# Patient Record
Sex: Female | Born: 1945
Health system: Southern US, Community
[De-identification: ages and names within clinical notes are randomized; demographics above are authoritative.]

## PROBLEM LIST (undated history)

## (undated) DIAGNOSIS — D126 Benign neoplasm of colon, unspecified: Secondary | ICD-10-CM

## (undated) DIAGNOSIS — I509 Heart failure, unspecified: Secondary | ICD-10-CM

## (undated) DIAGNOSIS — C50919 Malignant neoplasm of unspecified site of unspecified female breast: Secondary | ICD-10-CM

## (undated) DIAGNOSIS — K219 Gastro-esophageal reflux disease without esophagitis: Secondary | ICD-10-CM

## (undated) DIAGNOSIS — J45909 Unspecified asthma, uncomplicated: Secondary | ICD-10-CM

## (undated) DIAGNOSIS — K648 Other hemorrhoids: Secondary | ICD-10-CM

## (undated) DIAGNOSIS — F329 Major depressive disorder, single episode, unspecified: Secondary | ICD-10-CM

## (undated) DIAGNOSIS — M5126 Other intervertebral disc displacement, lumbar region: Secondary | ICD-10-CM

## (undated) DIAGNOSIS — R011 Cardiac murmur, unspecified: Secondary | ICD-10-CM

## (undated) DIAGNOSIS — K589 Irritable bowel syndrome without diarrhea: Secondary | ICD-10-CM

## (undated) DIAGNOSIS — I10 Essential (primary) hypertension: Secondary | ICD-10-CM

## (undated) DIAGNOSIS — E785 Hyperlipidemia, unspecified: Secondary | ICD-10-CM

## (undated) DIAGNOSIS — M25511 Pain in right shoulder: Secondary | ICD-10-CM

## (undated) DIAGNOSIS — E119 Type 2 diabetes mellitus without complications: Secondary | ICD-10-CM

## (undated) DIAGNOSIS — F32A Depression, unspecified: Secondary | ICD-10-CM

## (undated) DIAGNOSIS — M51369 Other intervertebral disc degeneration, lumbar region without mention of lumbar back pain or lower extremity pain: Secondary | ICD-10-CM

## (undated) DIAGNOSIS — D369 Benign neoplasm, unspecified site: Secondary | ICD-10-CM

## (undated) DIAGNOSIS — K5792 Diverticulitis of intestine, part unspecified, without perforation or abscess without bleeding: Secondary | ICD-10-CM

## (undated) DIAGNOSIS — M1990 Unspecified osteoarthritis, unspecified site: Secondary | ICD-10-CM

## (undated) DIAGNOSIS — R1013 Epigastric pain: Secondary | ICD-10-CM

## (undated) HISTORY — DX: Other intervertebral disc displacement, lumbar region: M51.26

## (undated) HISTORY — PX: APPENDECTOMY: SHX54

## (undated) HISTORY — PX: TONSILLECTOMY: SUR1361

## (undated) HISTORY — DX: Diverticulitis of intestine, part unspecified, without perforation or abscess without bleeding: K57.92

## (undated) HISTORY — DX: Benign neoplasm of colon, unspecified: D12.6

## (undated) HISTORY — PX: CHOLECYSTECTOMY: SHX55

## (undated) HISTORY — DX: Other hemorrhoids: K64.8

## (undated) HISTORY — DX: Irritable bowel syndrome, unspecified: K58.9

## (undated) HISTORY — DX: Essential (primary) hypertension: I10

## (undated) HISTORY — DX: Other intervertebral disc degeneration, lumbar region without mention of lumbar back pain or lower extremity pain: M51.369

## (undated) HISTORY — PX: TUBAL LIGATION: SHX77

## (undated) HISTORY — DX: Gastro-esophageal reflux disease without esophagitis: K21.9

## (undated) HISTORY — DX: Benign neoplasm, unspecified site: D36.9

## (undated) HISTORY — DX: Epigastric pain: R10.13

## (undated) HISTORY — PX: ABDOMINAL HYSTERECTOMY: SHX81

## (undated) HISTORY — DX: Unspecified osteoarthritis, unspecified site: M19.90

## (undated) HISTORY — DX: Malignant neoplasm of unspecified site of unspecified female breast: C50.919

## (undated) HISTORY — PX: EYE SURGERY: SHX253

## (undated) HISTORY — DX: Type 2 diabetes mellitus without complications: E11.9

---

## 1993-12-24 HISTORY — PX: MASTECTOMY: SHX3

## 2000-06-17 ENCOUNTER — Encounter: Payer: Self-pay | Admitting: General Surgery

## 2000-06-17 ENCOUNTER — Encounter: Admission: RE | Admit: 2000-06-17 | Discharge: 2000-06-17 | Payer: Self-pay | Admitting: General Surgery

## 2001-06-19 ENCOUNTER — Encounter: Admission: RE | Admit: 2001-06-19 | Discharge: 2001-06-19 | Payer: Self-pay | Admitting: Oncology

## 2001-06-19 ENCOUNTER — Encounter (HOSPITAL_COMMUNITY): Payer: Self-pay | Admitting: Oncology

## 2001-09-30 ENCOUNTER — Encounter (HOSPITAL_COMMUNITY): Admission: RE | Admit: 2001-09-30 | Discharge: 2001-10-30 | Payer: Self-pay | Admitting: Oncology

## 2001-10-30 ENCOUNTER — Ambulatory Visit (HOSPITAL_COMMUNITY): Admission: RE | Admit: 2001-10-30 | Discharge: 2001-10-30 | Payer: Self-pay | Admitting: Internal Medicine

## 2001-10-30 ENCOUNTER — Encounter: Payer: Self-pay | Admitting: Internal Medicine

## 2002-06-09 ENCOUNTER — Encounter: Payer: Self-pay | Admitting: Internal Medicine

## 2002-06-09 ENCOUNTER — Ambulatory Visit (HOSPITAL_COMMUNITY): Admission: RE | Admit: 2002-06-09 | Discharge: 2002-06-09 | Payer: Self-pay | Admitting: Internal Medicine

## 2002-07-31 ENCOUNTER — Ambulatory Visit (HOSPITAL_COMMUNITY): Admission: RE | Admit: 2002-07-31 | Discharge: 2002-07-31 | Payer: Self-pay | Admitting: Internal Medicine

## 2002-07-31 ENCOUNTER — Encounter: Payer: Self-pay | Admitting: Internal Medicine

## 2002-09-11 ENCOUNTER — Encounter (HOSPITAL_COMMUNITY): Payer: Self-pay | Admitting: Oncology

## 2002-09-11 ENCOUNTER — Encounter: Admission: RE | Admit: 2002-09-11 | Discharge: 2002-09-11 | Payer: Self-pay | Admitting: Oncology

## 2002-09-30 ENCOUNTER — Encounter: Admission: RE | Admit: 2002-09-30 | Discharge: 2002-09-30 | Payer: Self-pay | Admitting: Oncology

## 2002-09-30 ENCOUNTER — Encounter (HOSPITAL_COMMUNITY): Admission: RE | Admit: 2002-09-30 | Discharge: 2002-10-30 | Payer: Self-pay | Admitting: Oncology

## 2003-07-13 ENCOUNTER — Ambulatory Visit (HOSPITAL_COMMUNITY): Admission: RE | Admit: 2003-07-13 | Discharge: 2003-07-13 | Payer: Self-pay | Admitting: Internal Medicine

## 2003-07-13 HISTORY — PX: COLONOSCOPY: SHX174

## 2003-08-18 ENCOUNTER — Encounter: Payer: Self-pay | Admitting: Internal Medicine

## 2003-08-18 ENCOUNTER — Ambulatory Visit (HOSPITAL_COMMUNITY): Admission: RE | Admit: 2003-08-18 | Discharge: 2003-08-18 | Payer: Self-pay | Admitting: Internal Medicine

## 2003-10-01 ENCOUNTER — Encounter (HOSPITAL_COMMUNITY): Admission: RE | Admit: 2003-10-01 | Discharge: 2003-10-31 | Payer: Self-pay | Admitting: Oncology

## 2003-10-01 ENCOUNTER — Encounter: Admission: RE | Admit: 2003-10-01 | Discharge: 2003-10-01 | Payer: Self-pay | Admitting: Oncology

## 2003-10-05 ENCOUNTER — Encounter (HOSPITAL_COMMUNITY): Payer: Self-pay | Admitting: Oncology

## 2003-10-05 ENCOUNTER — Encounter: Admission: RE | Admit: 2003-10-05 | Discharge: 2003-10-05 | Payer: Self-pay | Admitting: Oncology

## 2003-11-09 ENCOUNTER — Ambulatory Visit (HOSPITAL_COMMUNITY): Admission: RE | Admit: 2003-11-09 | Discharge: 2003-11-09 | Payer: Self-pay | Admitting: Unknown Physician Specialty

## 2003-11-19 ENCOUNTER — Emergency Department (HOSPITAL_COMMUNITY): Admission: EM | Admit: 2003-11-19 | Discharge: 2003-11-19 | Payer: Self-pay | Admitting: Emergency Medicine

## 2004-01-04 ENCOUNTER — Encounter: Admission: RE | Admit: 2004-01-04 | Discharge: 2004-01-04 | Payer: Self-pay | Admitting: Neurosurgery

## 2004-01-18 ENCOUNTER — Encounter: Admission: RE | Admit: 2004-01-18 | Discharge: 2004-01-18 | Payer: Self-pay | Admitting: Neurosurgery

## 2004-03-08 ENCOUNTER — Ambulatory Visit (HOSPITAL_COMMUNITY): Admission: RE | Admit: 2004-03-08 | Discharge: 2004-03-08 | Payer: Self-pay | Admitting: Internal Medicine

## 2004-06-28 ENCOUNTER — Encounter: Admission: RE | Admit: 2004-06-28 | Discharge: 2004-06-28 | Payer: Self-pay | Admitting: Neurosurgery

## 2004-09-22 ENCOUNTER — Encounter: Admission: RE | Admit: 2004-09-22 | Discharge: 2004-09-22 | Payer: Self-pay | Admitting: Neurosurgery

## 2004-10-10 ENCOUNTER — Encounter (HOSPITAL_COMMUNITY): Admission: RE | Admit: 2004-10-10 | Discharge: 2004-11-09 | Payer: Self-pay | Admitting: Oncology

## 2004-10-10 ENCOUNTER — Encounter: Admission: RE | Admit: 2004-10-10 | Discharge: 2004-10-10 | Payer: Self-pay | Admitting: Oncology

## 2005-01-18 ENCOUNTER — Encounter (HOSPITAL_COMMUNITY): Admission: RE | Admit: 2005-01-18 | Discharge: 2005-02-17 | Payer: Self-pay | Admitting: Internal Medicine

## 2005-01-31 ENCOUNTER — Ambulatory Visit: Payer: Self-pay | Admitting: Internal Medicine

## 2005-02-01 ENCOUNTER — Ambulatory Visit (HOSPITAL_COMMUNITY): Admission: RE | Admit: 2005-02-01 | Discharge: 2005-02-01 | Payer: Self-pay | Admitting: Internal Medicine

## 2005-02-15 ENCOUNTER — Ambulatory Visit (HOSPITAL_COMMUNITY): Admission: RE | Admit: 2005-02-15 | Discharge: 2005-02-15 | Payer: Self-pay | Admitting: Internal Medicine

## 2005-03-14 ENCOUNTER — Ambulatory Visit: Payer: Self-pay | Admitting: Internal Medicine

## 2005-09-24 ENCOUNTER — Encounter: Admission: RE | Admit: 2005-09-24 | Discharge: 2005-09-24 | Payer: Self-pay | Admitting: Internal Medicine

## 2005-10-10 ENCOUNTER — Encounter (HOSPITAL_COMMUNITY): Admission: RE | Admit: 2005-10-10 | Discharge: 2005-11-09 | Payer: Self-pay | Admitting: Oncology

## 2005-10-10 ENCOUNTER — Encounter: Admission: RE | Admit: 2005-10-10 | Discharge: 2005-10-10 | Payer: Self-pay | Admitting: Oncology

## 2005-10-10 ENCOUNTER — Ambulatory Visit (HOSPITAL_COMMUNITY): Payer: Self-pay | Admitting: Oncology

## 2005-12-24 HISTORY — PX: BACK SURGERY: SHX140

## 2006-10-09 ENCOUNTER — Ambulatory Visit (HOSPITAL_COMMUNITY): Payer: Self-pay | Admitting: Oncology

## 2006-10-09 ENCOUNTER — Encounter (HOSPITAL_COMMUNITY): Admission: RE | Admit: 2006-10-09 | Discharge: 2006-11-08 | Payer: Self-pay | Admitting: Oncology

## 2006-10-09 ENCOUNTER — Encounter: Admission: RE | Admit: 2006-10-09 | Discharge: 2006-10-09 | Payer: Self-pay | Admitting: Oncology

## 2006-11-05 ENCOUNTER — Encounter: Admission: RE | Admit: 2006-11-05 | Discharge: 2006-11-05 | Payer: Self-pay | Admitting: Oncology

## 2006-11-20 ENCOUNTER — Inpatient Hospital Stay (HOSPITAL_COMMUNITY): Admission: RE | Admit: 2006-11-20 | Discharge: 2006-11-27 | Payer: Self-pay | Admitting: Neurosurgery

## 2006-11-25 ENCOUNTER — Ambulatory Visit: Payer: Self-pay | Admitting: Physical Medicine & Rehabilitation

## 2007-01-02 ENCOUNTER — Encounter (HOSPITAL_COMMUNITY): Admission: RE | Admit: 2007-01-02 | Discharge: 2007-02-01 | Payer: Self-pay | Admitting: Neurosurgery

## 2007-03-18 ENCOUNTER — Encounter: Admission: RE | Admit: 2007-03-18 | Discharge: 2007-03-18 | Payer: Self-pay | Admitting: Obstetrics and Gynecology

## 2007-10-08 ENCOUNTER — Ambulatory Visit (HOSPITAL_COMMUNITY): Payer: Self-pay | Admitting: Oncology

## 2008-03-15 ENCOUNTER — Other Ambulatory Visit: Admission: RE | Admit: 2008-03-15 | Discharge: 2008-03-15 | Payer: Self-pay | Admitting: Obstetrics & Gynecology

## 2008-06-24 ENCOUNTER — Encounter: Admission: RE | Admit: 2008-06-24 | Discharge: 2008-06-24 | Payer: Self-pay | Admitting: Obstetrics and Gynecology

## 2008-10-06 ENCOUNTER — Encounter (HOSPITAL_COMMUNITY): Admission: RE | Admit: 2008-10-06 | Discharge: 2008-11-05 | Payer: Self-pay | Admitting: Oncology

## 2008-10-06 ENCOUNTER — Ambulatory Visit (HOSPITAL_COMMUNITY): Payer: Self-pay | Admitting: Oncology

## 2009-09-30 ENCOUNTER — Encounter: Admission: RE | Admit: 2009-09-30 | Discharge: 2009-09-30 | Payer: Self-pay | Admitting: Internal Medicine

## 2009-10-17 ENCOUNTER — Ambulatory Visit (HOSPITAL_COMMUNITY): Payer: Self-pay | Admitting: Oncology

## 2010-01-19 ENCOUNTER — Ambulatory Visit (HOSPITAL_COMMUNITY): Admission: RE | Admit: 2010-01-19 | Discharge: 2010-01-19 | Payer: Self-pay | Admitting: Internal Medicine

## 2010-01-20 ENCOUNTER — Ambulatory Visit (HOSPITAL_COMMUNITY): Admission: RE | Admit: 2010-01-20 | Discharge: 2010-01-20 | Payer: Self-pay | Admitting: Internal Medicine

## 2010-03-07 ENCOUNTER — Encounter: Payer: Self-pay | Admitting: Orthopedic Surgery

## 2010-03-07 ENCOUNTER — Encounter: Admission: RE | Admit: 2010-03-07 | Discharge: 2010-03-07 | Payer: Self-pay | Admitting: Neurosurgery

## 2010-03-14 ENCOUNTER — Encounter: Payer: Self-pay | Admitting: Orthopedic Surgery

## 2010-03-30 ENCOUNTER — Ambulatory Visit: Payer: Self-pay | Admitting: Orthopedic Surgery

## 2010-03-30 DIAGNOSIS — M19019 Primary osteoarthritis, unspecified shoulder: Secondary | ICD-10-CM | POA: Insufficient documentation

## 2010-03-30 DIAGNOSIS — M75120 Complete rotator cuff tear or rupture of unspecified shoulder, not specified as traumatic: Secondary | ICD-10-CM | POA: Insufficient documentation

## 2010-04-03 ENCOUNTER — Encounter: Payer: Self-pay | Admitting: Orthopedic Surgery

## 2010-04-03 ENCOUNTER — Encounter (HOSPITAL_COMMUNITY): Admission: RE | Admit: 2010-04-03 | Discharge: 2010-05-03 | Payer: Self-pay | Admitting: Orthopedic Surgery

## 2010-05-01 ENCOUNTER — Ambulatory Visit: Payer: Self-pay | Admitting: Orthopedic Surgery

## 2010-05-03 ENCOUNTER — Encounter: Payer: Self-pay | Admitting: Orthopedic Surgery

## 2010-05-04 ENCOUNTER — Encounter (HOSPITAL_COMMUNITY): Admission: RE | Admit: 2010-05-04 | Discharge: 2010-06-03 | Payer: Self-pay | Admitting: Orthopedic Surgery

## 2010-06-05 ENCOUNTER — Encounter (HOSPITAL_COMMUNITY): Admission: RE | Admit: 2010-06-05 | Discharge: 2010-07-05 | Payer: Self-pay | Admitting: Orthopedic Surgery

## 2010-06-05 ENCOUNTER — Encounter: Payer: Self-pay | Admitting: Orthopedic Surgery

## 2010-10-02 ENCOUNTER — Encounter: Admission: RE | Admit: 2010-10-02 | Discharge: 2010-10-02 | Payer: Self-pay | Admitting: Internal Medicine

## 2010-11-03 ENCOUNTER — Ambulatory Visit (HOSPITAL_COMMUNITY): Payer: Self-pay | Admitting: Oncology

## 2010-11-03 ENCOUNTER — Encounter (HOSPITAL_COMMUNITY)
Admission: RE | Admit: 2010-11-03 | Discharge: 2010-12-03 | Payer: Self-pay | Source: Home / Self Care | Attending: Oncology | Admitting: Oncology

## 2010-11-21 ENCOUNTER — Ambulatory Visit: Payer: Self-pay | Admitting: Internal Medicine

## 2010-11-21 ENCOUNTER — Ambulatory Visit (HOSPITAL_COMMUNITY)
Admission: RE | Admit: 2010-11-21 | Discharge: 2010-11-21 | Payer: Self-pay | Source: Home / Self Care | Admitting: Internal Medicine

## 2010-11-21 ENCOUNTER — Encounter: Payer: Self-pay | Admitting: Urgent Care

## 2010-11-21 DIAGNOSIS — M719 Bursopathy, unspecified: Secondary | ICD-10-CM

## 2010-11-21 DIAGNOSIS — D126 Benign neoplasm of colon, unspecified: Secondary | ICD-10-CM | POA: Insufficient documentation

## 2010-11-21 DIAGNOSIS — M67919 Unspecified disorder of synovium and tendon, unspecified shoulder: Secondary | ICD-10-CM | POA: Insufficient documentation

## 2010-11-21 DIAGNOSIS — Z853 Personal history of malignant neoplasm of breast: Secondary | ICD-10-CM

## 2010-11-21 DIAGNOSIS — R109 Unspecified abdominal pain: Secondary | ICD-10-CM | POA: Insufficient documentation

## 2010-11-21 DIAGNOSIS — K5900 Constipation, unspecified: Secondary | ICD-10-CM | POA: Insufficient documentation

## 2010-11-21 LAB — CONVERTED CEMR LAB
ALT: 47 units/L — ABNORMAL HIGH (ref 0–35)
AST: 33 units/L (ref 0–37)
Alkaline Phosphatase: 84 units/L (ref 39–117)
Amylase: 55 units/L (ref 0–105)
Basophils Absolute: 0 10*3/uL (ref 0.0–0.1)
Bilirubin Urine: NEGATIVE
Eosinophils Absolute: 0.3 10*3/uL (ref 0.0–0.7)
Eosinophils Relative: 3 % (ref 0–5)
HCT: 37.3 % (ref 36.0–46.0)
Hemoglobin: 12.5 g/dL (ref 12.0–15.0)
Indirect Bilirubin: 0.5 mg/dL (ref 0.0–0.9)
Lipase: 31 units/L (ref 11–59)
Lymphocytes Relative: 25 % (ref 12–46)
MCHC: 33.5 g/dL (ref 30.0–36.0)
Neutrophils Relative %: 65 % (ref 43–77)
Nitrite: NEGATIVE
Platelets: 282 10*3/uL (ref 150–400)
RBC: 4.2 M/uL (ref 3.87–5.11)
Total Bilirubin: 0.5 mg/dL (ref 0.3–1.2)
Total Protein: 7.4 g/dL (ref 6.0–8.3)
Urobilinogen, UA: 0.2 (ref 0.0–1.0)
WBC: 8.4 10*3/uL (ref 4.0–10.5)

## 2010-11-23 ENCOUNTER — Encounter: Payer: Self-pay | Admitting: Internal Medicine

## 2010-11-23 ENCOUNTER — Telehealth (INDEPENDENT_AMBULATORY_CARE_PROVIDER_SITE_OTHER): Payer: Self-pay | Admitting: *Deleted

## 2010-11-23 DIAGNOSIS — D369 Benign neoplasm, unspecified site: Secondary | ICD-10-CM

## 2010-11-23 HISTORY — DX: Benign neoplasm, unspecified site: D36.9

## 2010-11-27 ENCOUNTER — Telehealth (INDEPENDENT_AMBULATORY_CARE_PROVIDER_SITE_OTHER): Payer: Self-pay

## 2010-12-11 ENCOUNTER — Telehealth (INDEPENDENT_AMBULATORY_CARE_PROVIDER_SITE_OTHER): Payer: Self-pay | Admitting: *Deleted

## 2010-12-13 ENCOUNTER — Ambulatory Visit (HOSPITAL_COMMUNITY)
Admission: RE | Admit: 2010-12-13 | Discharge: 2010-12-13 | Payer: Self-pay | Source: Home / Self Care | Attending: Internal Medicine | Admitting: Internal Medicine

## 2010-12-13 HISTORY — PX: COLONOSCOPY: SHX174

## 2010-12-13 HISTORY — PX: ESOPHAGOGASTRODUODENOSCOPY: SHX1529

## 2010-12-18 ENCOUNTER — Encounter: Payer: Self-pay | Admitting: Internal Medicine

## 2010-12-19 ENCOUNTER — Telehealth (INDEPENDENT_AMBULATORY_CARE_PROVIDER_SITE_OTHER): Payer: Self-pay

## 2011-01-13 ENCOUNTER — Encounter (HOSPITAL_COMMUNITY): Payer: Self-pay | Admitting: Oncology

## 2011-01-13 ENCOUNTER — Encounter: Payer: Self-pay | Admitting: Unknown Physician Specialty

## 2011-01-23 NOTE — Letter (Signed)
Summary: TCS ORDER  TCS ORDER   Imported By: Ave Filter 11/23/2010 14:09:00  _____________________________________________________________________  External Attachment:    Type:   Image     Comment:   External Document  Appended Document: phone note/ pt cx TCS ok; call her back in mid jan to schedule

## 2011-01-23 NOTE — Miscellaneous (Signed)
Summary: OT Progress note  OT Progress note   Imported By: Jacklynn Ganong 05/08/2010 15:04:26  _____________________________________________________________________  External Attachment:    Type:   Image     Comment:   External Document

## 2011-01-23 NOTE — Assessment & Plan Note (Signed)
Summary: CONSULT/TREAT ROTAT CUFF/XR+MRI GR.IMAG/REF K.CABBELL/BCBS,ME...   Vital Signs:  Patient profile:   65 year old female Height:      61 inches Weight:      243 pounds Pulse rate:   70 / minute Resp:     16 per minute  Vitals Entered By: Fuller Canada MD (March 30, 2010 11:55 AM)  Visit Type:  Initial Consult Referring Provider:  Dr. Franky Macho Primary Provider:  Dr. Ouida Sills  CC:  left shoulder pain.  History of Present Illness: I saw Ann Fuller in the office today for an initial visit.  She is a 65 years old woman with the complaint of:  Left shoulder pain.  Xrays and MRI [facility] 03/07/10 left shoulder.  Meds: Insulin, Byetta, Temazepam, Amlodipine, Vicodin, Lipitor, Vitamin D, Lorazepam, ASA, Losartan, Nexium, Chlorthaldone, Carvedilol.  This is a 65 year old female who was injured back in 2009 with a pulling injury to the LEFT arm while she was trying to grab a travel bag.  She had an MRI done and it showed that showed a rotator cuff tear.  That MRI was done March 15 of 2011 shows a full thickness partially retracted supraspinatus tendon tear with muscle atrophy there is tendinosis in the infraspinatus and his glenohumeral degenerative changes.  Her pain has progressively gotten worse she complains of throbbing stabbing and burning.  Her pain is a 10.  It comes and goes it's worse at night and with exercise.  She complains of some numbness tingling locking and catching when lifting her arm and she has trouble doing her hair  Dr. Franky Macho did see her for cervical spine disease.  She had therapy for 6 weeks.  He indicates there is no problem in her cervical spine.  Allergies (verified): 1)  ! Codeine  Past History:  Past Medical History: htn diabetes cholesterol acid reflux  Past Surgical History: lower back gall bladder tubal ligation left mastectomy hysterectomy  Family History: FH of Cancer:  Family History of Diabetes Family History  Coronary Heart Disease female < 74 Family History of Arthritis Hx, family, chronic respiratory condition Hx, family, kidney disease NEC  Social History: Patient is married.  retired no smoking no alcohol use 2 cups of coffee each am  Review of Systems Constitutional:  Complains of fatigue; denies weight loss, weight gain, fever, and chills. Cardiovascular:  Complains of murmurs; denies chest pain, palpitations, and fainting. Respiratory:  Denies short of breath, wheezing, couch, tightness, pain on inspiration, and snoring . Gastrointestinal:  Complains of diarrhea and constipation; denies heartburn, nausea, vomiting, and blood in your stools. Genitourinary:  Denies frequency, urgency, difficulty urinating, painful urination, flank pain, and bleeding in urine. Neurologic:  Complains of numbness and tingling; denies unsteady gait, dizziness, tremors, and seizure. Musculoskeletal:  Denies joint pain, swelling, instability, stiffness, redness, heat, and muscle pain. Endocrine:  Complains of heat or cold intolerance; denies excessive thirst and exessive urination. Psychiatric:  Complains of nervousness and anxiety; denies depression and hallucinations. Skin:  Denies changes in the skin, poor healing, rash, itching, and redness. HEENT:  Denies blurred or double vision, eye pain, redness, and watering. Immunology:  Complains of seasonal allergies; denies sinus problems and allergic to bee stings. Hemoatologic:  Denies easy bleeding and brusing.  Physical Exam  Additional Exam:  GEN: well developed, well nourished, normal grooming and hygiene, no deformity and abnormal body habitus she is endomorphic  Height 5 feet 1 inch weight 213 pounds  CDV: pulses are normal, no edema, no erythema.  no tenderness  Lymph: normal lymph nodes   Skin: no rashes, skin lesions or open sores   NEURO: normal coordination, reflexes, sensation.   Psyche: awake, alert and oriented. Mood normal   Gait:  normal  LEFT shoulder examination reveals weakness decreased range of motion.  Acromial tenderness.  The shoulder is stable weakness is primarily in the supraspinatus tendon but there is mild weakness in the external rotators  RIGHT shoulder full range of motion, good shrinkage which is 5 out of 5  Stable shoulder.  No tenderness.        Impression & Recommendations:  Problem # 1:  RUPTURE ROTATOR CUFF (ICD-727.61) Assessment New  Orders: Consultation Level III (43329) Joint Aspirate / Injection, Large (20610) Depo- Medrol 40mg  (J1030)  Problem # 2:  SHOULDER, ARTHRITIS, DEGEN./OSTEO (ICD-715.91) Assessment: New  LEFT shoulder injection Verbal consent obtained/The shoulder was injected with depomedrol 40mg /cc and sensorcaine .25% . There were no complications  I advised that she should probably have a rotator cuff repair although with the amount of atrophy she has and the length of time since the tear she only has a 60% chance of success.  She therefore opted for injection and physical therapy  Orders: Consultation Level III (51884) Joint Aspirate / Injection, Large (20610) Depo- Medrol 40mg  (J1030)  Other Orders: Physical Therapy Referral (PT)  Patient Instructions: 1)  You have received an injection of cortisone today. You may experience increased pain at the injection site. Apply ice pack to the area for 20 minutes every 2 hours and take 2 xtra strength tylenol every 8 hours. This increased pain will usually resolve in 24 hours. The injection will take effect in 3-10 days.  2)  return in 2 months  3)  PT  4)  continue vicodin for pain

## 2011-01-23 NOTE — Consult Note (Signed)
Summary: Office note ftom Dr. Franky Macho  Office note ftom Dr. Franky Macho   Imported By: Jacklynn Ganong 04/04/2010 13:59:02  _____________________________________________________________________  External Attachment:    Type:   Image     Comment:   External Document

## 2011-01-23 NOTE — Miscellaneous (Signed)
Summary: Orders Update  Clinical Lists Changes  Orders: Added new Test order of T-Creatinine Blood [phone]) - Signed  Appended Document: ABD Pain has she started the miralax and colace? any results with BMs?  Appended Document: ABD Pain Per the pt she took the Colace and Miralax last night and still having abd pain.  Appended Document: ABD Pain She did have a bm last night.  Appended Document: ABD Pain Spoke with pt. States she is having LUQ pain under left breast/ribcage, 8/10, "crampy" in nature, +nausea. Had been taking Nexium daily but actually started taking sporadically secondary to diarrhea. States she feels like one might feel if they "had an ulcer". She is scheduled for a colonoscopy in mid December. Reviewed labs and CT, all benign except for +stool in colon. Has had productive BM, does not feel distended/constipated/bloated. Will switch from Nexium to Dexilant daily. I have placed about 2 weeks worth at front desk for her to pick up. I discussed with her signs/symptoms to watch for. If she has unresolved abdominal pain, worsening of symptoms, she was instructed to go to ED. Discussed that if LUQ pain continues despite daily PPI, may benefit from EGD along with TCS. Informed to contact our office towards end of sample pack to give progress report. Stated understanding.

## 2011-01-23 NOTE — Miscellaneous (Signed)
Summary: OT progress note  OT progress note   Imported By: Jacklynn Ganong 06/12/2010 13:23:00  _____________________________________________________________________  External Attachment:    Type:   Image     Comment:   External Document

## 2011-01-23 NOTE — Miscellaneous (Signed)
Summary: OT Clinical evaluation  OT Clinical evaluation   Imported By: Jacklynn Ganong 04/27/2010 08:47:19  _____________________________________________________________________  External Attachment:    Type:   Image     Comment:   External Document

## 2011-01-23 NOTE — Assessment & Plan Note (Signed)
Summary: 1 M RE-CK LT SHOULDER FOL'G PT/BCBS/MEDICARE/CAF   Referring Provider:  Dr. Franky Macho Primary Provider:  Dr. Ouida Sills   History of Present Illness: She is a 65 years old woman with the complaint of:  Left shoulder pain.  Xrays and MRI [facility] 03/07/10 left shoulder.  Meds: Insulin, Byetta, Temazepam, Amlodipine, Vicodin, Lipitor, Vitamin D, Lorazepam, ASA, Losartan, Nexium, Chlorthaldone, Carvedilol.  This is a 65 year old female who was injured back in 2009 with a pulling injury to the LEFT arm while she was trying to grab a travel bag.  She had an MRI done and it showed that showed a rotator cuff tear.  That MRI was done March 15 of 2011 shows a full thickness partially retracted supraspinatus tendon tear with muscle atrophy there is tendinosis in the infraspinatus and his glenohumeral degenerative changes.  Her pain has progressively gotten worse she complains of throbbing stabbing and burning.  Her pain is a 10.  It comes and goes it's worse at night and with exercise.  She complains of some numbness tingling locking and catching when lifting her arm and she has trouble doing her hair  Dr. Franky Macho did see her for cervical spine disease.  She had therapy for 6 weeks.  He indicates there is no problem in her cervical spine.  I advised that she should probably have a rotator cuff repair although with the amount of atrophy she has and the length of time since the tear she only has a 60% chance of success.  She therefore opted for injection and physical therapy  Today recheck post injection: pain is better  Hydrocodone for pain with 500 mg of tylenol     Allergies: 1)  ! Codeine  Physical Exam  Additional Exam:  Normal Appearance, Oriented x 3, Mood normal She was able to move her shoulder at 90 abduction 90 flexion she still had weakness in abduction and forward elevation      Impression & Recommendations:  Problem # 1:  SHOULDER, ARTHRITIS, DEGEN./OSTEO  (ICD-715.91) Assessment Improved  Orders: Est. Patient Level II ([address])  Problem # 2:  RUPTURE ROTATOR CUFF (ICD-727.61) Assessment: Improved  Orders: Est. Patient Level II (60454)  Patient Instructions: 1)  Please schedule a follow-up appointment in 3 months.

## 2011-01-23 NOTE — Letter (Signed)
Summary: History form  History form   Imported By: Jacklynn Ganong 04/04/2010 13:59:43  _____________________________________________________________________  External Attachment:    Type:   Image     Comment:   External Document

## 2011-01-23 NOTE — Letter (Signed)
Summary: CT ABD/PEL ORDER  CT ABD/PEL ORDER   Imported By: Rexene Alberts 11/21/2010 12:25:51  _____________________________________________________________________  External Attachment:    Type:   Image     Comment:   External Document

## 2011-01-23 NOTE — Letter (Signed)
Summary: *Orthopedic Consult Note  [facility]  [address] [place] [address]  [place], [place] [address]   Phone: [phone]  Fax: [phone]    Re:    SHARELL HILMER DOB:    March 07, 1946   Dear: Dr. Mikal Plane  Thank you for requesting that we see the above patient for consultation.  A copy of the detailed office note will be sent under separate cover, for your review.  Evaluation today is consistent with:  1)  SHOULDER, ARTHRITIS, DEGEN./OSTEO (ICD-715.91) 2)  RUPTURE ROTATOR CUFF (ICD-727.61)  Our recommendation is for: surgical repair but she only has a 60% chance of success placed on the atrophy and time interval since the tear.  She is therefore opted for physical therapy and subacromial injection.  Thank you for this opportunity to look after your patient.  Sincerely,   Terrance Mass. MD.

## 2011-01-25 ENCOUNTER — Ambulatory Visit: Payer: Medicare Other | Admitting: Gastroenterology

## 2011-01-25 ENCOUNTER — Encounter: Payer: Self-pay | Admitting: Gastroenterology

## 2011-01-25 DIAGNOSIS — R197 Diarrhea, unspecified: Secondary | ICD-10-CM | POA: Insufficient documentation

## 2011-01-25 DIAGNOSIS — R109 Unspecified abdominal pain: Secondary | ICD-10-CM

## 2011-01-25 NOTE — Letter (Signed)
Summary: Patient Notice, Colon Biopsy Results  [facility]  [address]   [place], [place] [address]   Phone: [phone]  Fax: [phone]       December 18, 2010   Ann Fuller [address] [place], [place]  [address] 02-27-1946    Dear [name],  I am pleased to inform you that the biopsies taken during your recent colonoscopy did not show any evidence of cancer upon pathologic examination.  Additional information/recommendations:  No further action is needed at this time.  Please follow-up with your primary care physician for your other healthcare needs.  Continue with the treatment plan as outlined on the day of your exam.  You should have a repeat colonoscopy examination  in 5 years.  Please call us if you are having persistent problems or have questions about your condition that have not been fully answered at this time.  Sincerely,    R. Roetta Sessions MD, FACP [facility] Ph: [phone]    Fax: [phone]   Appended Document: Patient Notice, Colon Biopsy Results Letter mailed to Pt.   Appended Document: Patient Notice, Colon Biopsy Results reminder in computer

## 2011-01-25 NOTE — Progress Notes (Signed)
Summary: ABD Pain  Phone Note Call from Patient Call back at Home Phone [phone]   Caller: Patient Call For: Lorenza Burton Reason for Call: Talk to Doctor Summary of Call: Patient called and stated she is still having the abd pain.Marland KitchenMarland KitchenPlease advise? Initial call taken by: Ave Filter,  November 23, 2010 10:58 AM     Appended Document: ABD Pain has she started the miralax and colace? any results with BMs?  Appended Document: ABD Pain Per the pt she took the Colace and Miralax last night and still having abd pain.  Appended Document: ABD Pain She did have a bm last night.  Appended Document: ABD Pain Spoke with pt. States she is having LUQ pain under left breast/ribcage, 8/10, "crampy" in nature, +nausea. Had been taking Nexium daily but actually started taking sporadically secondary to diarrhea. States she feels like one might feel if they "had an ulcer". She is scheduled for a colonoscopy in mid December. Reviewed labs and CT, all benign except for +stool in colon. Has had productive BM, does not feel distended/constipated/bloated. Will switch from Nexium to Dexilant daily. I have placed about 2 weeks worth at front desk for her to pick up. I discussed with her signs/symptoms to watch for. If she has unresolved abdominal pain, worsening of symptoms, she was instructed to go to ED. Discussed that if LUQ pain continues despite daily PPI, may benefit from EGD along with TCS. Informed to contact our office towards end of sample pack to give progress report. Stated understanding.

## 2011-01-25 NOTE — Progress Notes (Signed)
Summary: ABD PAIN  Phone Note Call from Patient Call back at Home Phone [phone]   Caller: Patient Call For: Lorenza Burton Reason for Call: Talk to Doctor Complaint: Abdominal Pain Action Taken: Provider Notified Summary of Call: Patient called and she is still complaining of abd pain. She would like to have an egd added to her already scheduled tcs. She also wanted know if there is anything else she could take for pain.Marland KitchenMarland KitchenMarland KitchenPlease advise? Initial call taken by: Ave Filter,  December 11, 2010 2:59 PM     Appended Document: ABD PAIN Called pt.  Still w/ left-sided pain 8/10.  Taking miralax once this week caused diarrhea, so did not take.  Had diarrhea 5 times in one day.  Taking Dexilant twice in past week.  BM daily otherwise.  Denies fever or chills.  c/o nausea, but denies vomiting.   Plan: 1) Take DEXILANT every day 2) Hold Miralax if diarrhea 3) Add EGD to TCS & pt agrees to sched ASAP--will arrange Pt agrees w/ above plan.  Appended Document: ABD PAIN Pt scheduled to 12/13/10@11 :30am

## 2011-01-25 NOTE — Progress Notes (Signed)
Summary: phone note/ pt cx TCS  Phone Note Call from Patient   Caller: Patient Summary of Call: Pt called and cx appt for TCS for 12/13/2010. York Spaniel she will call back and schedule in Jan or Feb 2012. Just does not want to do during the holidays. LMOM for Kim and Cx in IDX. Initial call taken by: Cloria Spring LPN,  November 27, 2010 2:16 PM     Appended Document: phone note/ pt cx TCS ok; call her back in mid jan to schedule  Appended Document: phone note/ pt cx TCS Pt had done on 12/13/2010.

## 2011-01-25 NOTE — Assessment & Plan Note (Signed)
Summary: ALOT OF PAIN IN LEFT SIDE ? DIVERTICULITIS/LAW   Visit Type:  Initial Visit Referring Provider:  Dr Ouida Sills Primary Care Provider:  Dr Ouida Sills  Chief Complaint:  L side abd pain.  History of Present Illness: Self-referred for further evaluation of left-sided abd pain radiates around mid-abd.  "I think I may have diverticulitis."  Oct 10-14 had severe pain, chills.  Denies N/V.  Had constipation with BM q2days .  Pain returned 0300 4 days ago.  Pain 10/10.  Pressure still in LUQ/LLQ 2/10.  Worse w/ eating & movement.  Denies rectal bleeding or melena.  Took Phillip's MOM.  Wt stable.  Appetite decreased, eats 1/2 of meals.  States she has been treated for diverticulitis multiple times over her lifetime but never required hospitalization.  Current Problems (verified): 1)  Abdominal Pain  (ICD-789.00) 2)  Carcinoma, Breast, Hx of  (ICD-V10.3) 3)  Adenomatous Colonic Polyp  (ICD-211.3) 4)  Abdominal Pain  (ICD-789.00) 5)  Shoulder, Arthritis, Degen.Lanetta Inch  (ICD-715.91) 6)  Hx of Rupture Rotator Cuff  (ICD-727.61) 7)  Rotator Cuff Syndrome, Left  (ICD-726.10)  Current Medications (verified): 1)  Chlorthalidone 25 Mg Tabs (Chlorthalidone) .... Once Daily 2)  Nexium 40 Mg Cpdr (Esomeprazole Magnesium) .... Once Daily 3)  Losartan Potassium 100 Mg Tabs (Losartan Potassium) .... Once Daily 4)  Lipitor 40 Mg Tabs (Atorvastatin Calcium) .... Once Daily 5)  Temazepam 15 Mg Caps (Temazepam) .Marland Kitchen.. 1-2 At Bedtime As Needed 6)  Hydrocodone-Acetaminophen 5-500 Mg Tabs (Hydrocodone-Acetaminophen) .... As Needed 7)  Amlodipine Besylate 5 Mg Tabs (Amlodipine Besylate) .... Once Daily 8)  Lorazepam 1 Mg Tabs (Lorazepam) .... Once Daily 9)  Humulin 70/30 70-30 % Susp (Insulin Isophane & Regular) .... 35 U Two Times A Day 10)  Byetta 10 Mcg Pen 10 Mcg/0.55ml Soln (Exenatide) .... Two Times A Day 11)  Blood Pressure .... Once Daily  Allergies (verified): 1)  ! Codeine  Past History:  Past Medical  History: htn diabetes cholesterol acid reflux colon adenomatous polyps diverticulitis thinks treated outpt Abx 5-6 times in her life breast ca 1990,   Last colonoscopy Dr Jena Gauss 2004->deverticulosis, inflamm polyp, adenomatous polyp, int hemorrhoids: overdue for surveillance TCS Dr Jena Gauss recommended 3-yr FU  Past Surgical History: lower back 2007 gall bladder tubal ligation left mastectomy hysterectomy  Family History: FH of Cancer: MOTHER COLON CA late 63s Family History of Diabetes Family History Coronary Heart Disease female < 67 Family History of Arthritis Hx, family, chronic respiratory condition Hx, family, kidney disease NEC  Social History: Patient is married.  2 grown healthy children retired, Runner, broadcasting/film/video asst no smoking no alcohol use 2 cups of coffee each am Illicit Drug Use - no Drug Use:  no  Review of Systems General:  Complains of chills and fatigue; denies fever, sweats, weakness, malaise, and sleep disorder. CV:  Denies chest pains, angina, palpitations, syncope, dyspnea on exertion, orthopnea, PND, peripheral edema, and claudication. Resp:  Denies dyspnea at rest, dyspnea with exercise, cough, sputum, wheezing, coughing up blood, and pleurisy. GI:  Denies difficulty swallowing, pain on swallowing, jaundice, bloody BM's, black BMs, and fecal incontinence. GU:  Denies urinary burning, blood in urine, nocturnal urination, urinary frequency, urinary incontinence, and abnormal vaginal bleeding. MS:  Complains of joint pain / LOM, joint swelling, and low back pain; left knee . Derm:  Denies rash, itching, dry skin, hives, moles, warts, and unhealing ulcers. Psych:  Denies depression, anxiety, memory loss, suicidal ideation, hallucinations, paranoia, phobia, and confusion. Heme:  Denies bruising,  bleeding, and enlarged lymph nodes.  Vital Signs:  Patient profile:   65 year old female Height:      61 inches Weight:      244 pounds BMI:     46.27 Temp:     98.1  degrees F oral Pulse rate:   60 / minute BP sitting:   128 / 80  (right arm) Cuff size:   large  Vitals Entered By: Hendricks Limes LPN (November 21, 2010 11:45 AM)  Physical Exam  General:  Well developed, obese, no acute distress. Head:  Normocephalic and atraumatic. Eyes:  Sclera clear, no icterus. Ears:  Normal auditory acuity. Mouth:  No deformity or lesions, dentition normal. Neck:  Supple; no masses or thyromegaly. Lungs:  Clear throughout to auscultation. Heart:  Regular rate and rhythm; no murmurs, rubs,  or bruits. Abdomen:  normal bowel sounds, obese, LUQ tenderness, LLQ tenderness, without guarding, without rebound, no hernia, no masses, and no hepatomegally or splenomegaly, but exam limited secondary to body habitus Msk:  Symmetrical with no gross deformities. Normal posture. Pulses:  Normal pulses noted. Extremities:  No clubbing, cyanosis, edema or deformities noted. Neurologic:  Alert and  oriented x4;  grossly normal neurologically. Skin:  Intact without significant lesions or rashes. Cervical Nodes:  No significant cervical adenopathy. Psych:  Alert and cooperative. Normal mood and affect.  Impression & Recommendations:  Problem # 1:  ABDOMINAL PAIN (ICD-789.00) 65 y/o black female w/ intermittant severe left-sided abd pain, worsened w/ movement & eating.  Hx diverticulitis yrs ago.  She does have an element of constipation which may be contributing to her pain.  Other differentials include UTI, less likely pancreatitis, renal lithiasis, referred back pain or malignancy. CT Abd/pelvis w/IV/oral contrast ASAP.  Orders: T-CBC w/Diff [id number]) T-Lipase [id number]) T-Amylase [id number]) T-Hepatic Function [phone]) T-Urinalysis ([id number]) New Patient Level III (02725)  Problem # 2:  ADENOMATOUS COLONIC POLYP (ICD-211.3) Overdue for surveillance  Problem # 3:  CARCINOMA, BREAST, HX OF (ICD-V10.3) Assessment: Comment Only  Problem # 4:   CONSTIPATION (ICD-564.00) See #1  Patient Instructions: 1)  To ER w/ severe pain 2)  We will call w/ lab & CT results as soon as available.

## 2011-01-25 NOTE — Progress Notes (Signed)
Summary: rx request  Phone Note Call from Patient Call back at Home Phone [phone]   Caller: Patient Summary of Call: pt called- is having some vaginal itching after taking the antibiotics that RMR gave her after her procedure. pt is requesting rx for diflucan called to [facility] pharmacy.   pt also requesting rx for dexilant called in.    Initial call taken by: Hendricks Limes LPN,  December 19, 2010 2:46 PM     Appended Document: rx request  PLEASE CALL PT & HAVE HER STOP LIPITOR when she takes DIFLUCAN & hold for 1 week then resume.  Thanks  Prescriptions: DEXILANT 60 MG CPDR (DEXLANSOPRAZOLE) 1 by mouth daily for acid reflux  #31 x 5   Entered and Authorized by:   Joselyn Arrow FNP-BC   Signed by:   Joselyn Arrow FNP-BC on 12/19/2010   Method used:   Electronically to        [facility]* (retail)       [address]       [place], [place]  [address]       Ph: [phone] or [phone]       Fax: [phone]   RxID:   [id number] DIFLUCAN 150 MG TABS (FLUCONAZOLE) 1 by mouth once  #1 x 0   Entered and Authorized by:   Joselyn Arrow FNP-BC   Signed by:   Joselyn Arrow FNP-BC on 12/19/2010   Method used:   Electronically to        [facility]* (retail)       [address]       [place], [place]  [address]       Ph: [phone] or [phone]       Fax: [phone]   RxID:   [id number]     Appended Document: rx request Informed pt to Stop the Lipitor when she takes the Diflucan, and hold it for one week.

## 2011-01-31 ENCOUNTER — Encounter: Payer: Self-pay | Admitting: Gastroenterology

## 2011-02-08 NOTE — Assessment & Plan Note (Signed)
Summary: DIVERTICULITIS   Vital Signs:  Patient profile:   65 year old female Height:      61 inches Weight:      237 pounds BMI:     44.94 Temp:     97.8 degrees F oral Pulse rate:   80 / minute BP sitting:   130 / 70  (left arm) Cuff size:   large  Vitals Entered By: Hendricks Limes LPN (January 25, 2011 1:52 PM)  Visit Type:  Follow-up Visit Primary Care Provider:  Dr Ouida Sills  CC:  abd pain.  History of Present Illness: Pt presents today in f/u after EGD/colon. See findings below. ?mild diverticulitis. finished empirical abx, then had to have additional abx week after secondary to URI. c/o diarrhea starting Saturday, with diarrhea that began immediately after eating fish sandwich. Reports loose stools prior to colonoscopy, which then tapered off. Reports 3 loose stools this morning. Saturday had 4, but then nothing until wednesday. took stool softeners. c/o LUQ pain, feels like "gas", intermittent, sometimes preceeding diarrhea. slight nausea last night, otherwise ok. When on Dexilant, states didn't have as bad diarrhea. On Nexium now. Pt asked if stress could be related to this intermittent discomfort and postprandial diarrhea.        12/21/11IMPRESSION: 1. Esophagogastroduodenoscopy, normal esophagus, small hiatal hernia,     fundal gland-type polyps not manipulated; otherwise, normal stomach     D1-D2.  Colonoscopy findings diminutive rectal polyp, status post     cold biopsy removal, otherwise unremarkable rectum. 2. Scattered pancolonic diverticular.  Polyps in the ascending colon     removed as described above.  Normal terminal ileum. 3. The patient may have low-grade smoldering diverticulitis and not     appreciated on CT.  We will go ahead and treat for such impression.  Current Medications (verified): 1)  Chlorthalidone 25 Mg Tabs (Chlorthalidone) .... Once Daily 2)  Nexium 40 Mg Cpdr (Esomeprazole Magnesium) .... Once Daily 3)  Losartan Potassium 100 Mg Tabs  (Losartan Potassium) .... Once Daily 4)  Lipitor 40 Mg Tabs (Atorvastatin Calcium) .... Once Daily 5)  Temazepam 15 Mg Caps (Temazepam) .Marland Kitchen.. 1-2 At Bedtime As Needed 6)  Hydrocodone-Acetaminophen 5-500 Mg Tabs (Hydrocodone-Acetaminophen) .... As Needed 7)  Amlodipine Besylate 5 Mg Tabs (Amlodipine Besylate) .... Once Daily 8)  Lorazepam 1 Mg Tabs (Lorazepam) .... Once Daily 9)  Humulin 70/30 70-30 % Susp (Insulin Isophane & Regular) .... 35 U Two Times A Day 10)  Byetta 10 Mcg Pen 10 Mcg/0.85ml Soln (Exenatide) .... Two Times A Day 11)  Blood Pressure .... Once Daily 12)  Prednisone 10 Mg Tabs (Prednisone) .... 1/2 Tab X3days 13)  Carvedilol 3.125 Mg Tabs (Carvedilol) .... Two Times A Day  Allergies (verified): 1)  ! Codeine  Past History:  Past Medical History: htn diabetes cholesterol acid reflux colon adenomatous polyps diverticulitis thinks treated outpt Abx 5-6 times in her life breast ca 1990,    Dr Jena Gauss 2004->deverticulosis, inflamm polyp, adenomatous polyp, int hemorrhoids: overdue for surveillance  EGD/colon 11/2010: EGD nl with fundal gland-type polyps; scattered pancolonic diverticula, tubular adenoma  Review of Systems General:  Denies fever, chills, and anorexia. Eyes:  Denies blurring, irritation, and discharge. ENT:  Denies sore throat, hoarseness, and difficulty swallowing. CV:  Denies chest pains and syncope. Resp:  Denies dyspnea at rest and wheezing. GI:  Complains of abdominal pain and diarrhea; denies difficulty swallowing, pain on swallowing, nausea, indigestion/heartburn, bloody BM's, black BMs, and fecal incontinence. GU:  Denies urinary  burning and urinary frequency. MS:  Denies joint pain / LOM, joint swelling, and joint stiffness. Derm:  Denies rash, itching, and dry skin. Neuro:  Denies weakness and syncope. Psych:  Denies depression and anxiety. Endo:  Denies cold intolerance and heat intolerance.  Physical Exam  General:  Well developed, well  nourished, no acute distress. Head:  Normocephalic and atraumatic. Eyes:  sclera without icterus Lungs:  Clear throughout to auscultation. Heart:  Regular rate and rhythm; no murmurs, rubs,  or bruits. Abdomen:  +BS, soft, mildly TTP left-sided abdomen, no rebound or guarding, no HSM>  Msk:  Symmetrical with no gross deformities. Normal posture. Neurologic:  Alert and  oriented x4;  grossly normal neurologically. Skin:  Intact without significant lesions or rashes. Psych:  Alert and cooperative. Normal mood and affect.   Impression & Recommendations:  Problem # 1:  DIARRHEA (ICD-32.32) 65 year old female with postprandial diarrhea alternating with constipation. Likely IBS, exacerbated by stress. Has recently been on abx twice since December 2011. EGD/Colon done 11/2010 with reassurring findings. See HPI. +Left-sided abdominal cramping prior to diarrhea. No melena or hematochezia. Will check Cdiff as pt has been on abx, but highly unlikely this is the culprit. As of note, pt states while on Dexilant diarrhea was lessened.   Change back to Dexilant. Stop Nexium. We will contact pharmacy for assistance if needed Cdiff PCR Supplemental fiber to diet Bentyl ac and at bedtime as needed (has used in remote past) 6 weeks return.  Orders: T-C diff by PCR ([address]) Est. Patient Level II (60454)  Problem # 2:  ABDOMINAL PAIN (ICD-789.00) See # 1. Likely component of IBS.  Orders: T-C diff by PCR ([address]) Est. Patient Level II (91478) Prescriptions: BENTYL 10 MG CAPS (DICYCLOMINE HCL) take 1 prior to meals as needed  #90 x 1   Entered and Authorized by:   Gerrit Halls NP   Signed by:   Gerrit Halls NP on 01/25/2011   Method used:   Faxed to ...       [facility]* (retail)       [address]       [place], [place]  [address]       Ph: [phone] or [phone]       Fax: [phone]   RxID:   [id number]    Orders Added: 1)  T-C diff by PCR [81755] 2)   Est. Patient Level II [87564]  Appended Document: DIVERTICULITIS 6 WK OPV IS IN THE COMPUTER

## 2011-02-28 ENCOUNTER — Encounter: Payer: Self-pay | Admitting: Gastroenterology

## 2011-03-05 LAB — GLUCOSE, CAPILLARY

## 2011-03-06 ENCOUNTER — Ambulatory Visit (INDEPENDENT_AMBULATORY_CARE_PROVIDER_SITE_OTHER): Payer: Medicare Other | Admitting: Gastroenterology

## 2011-03-06 ENCOUNTER — Encounter: Payer: Self-pay | Admitting: Gastroenterology

## 2011-03-06 DIAGNOSIS — R197 Diarrhea, unspecified: Secondary | ICD-10-CM

## 2011-03-06 LAB — BUN: BUN: 12 mg/dL (ref 6–23)

## 2011-03-13 NOTE — Assessment & Plan Note (Signed)
Summary: IBS FU IN 6 WEEKS   Vital Signs:  Patient profile:   65 year old female Height:      62 inches Weight:      240 pounds BMI:     44.06 Temp:     98.9 degrees F oral Pulse rate:   72 / minute BP sitting:   140 / 76  (right arm)  Vitals Entered By: Carolan Clines LPN (March 06, 2011 2:48 PM)   Visit Type:  Follow-up Visit Primary Care Provider:  Dr Ouida Sills   History of Present Illness: Here in f/u for IBS-D. Significantly improved from last visit. Actually only averaging taking Bentyl once/day. No N/V. No abdominal pain. Good appetite. Taking Dexilant, which seems to be working better than Nexium, which caused diarrhea. No dysphagia/odynophagia. No abdominal pain. Taking fiber supplements as well.   Current Medications (verified): 1)  Chlorthalidone 25 Mg Tabs (Chlorthalidone) .... Once Daily 2)  Dexilant 60 Mg Cpdr (Dexlansoprazole) .... Take One Once Daily 3)  Losartan Potassium 100 Mg Tabs (Losartan Potassium) .... Once Daily 4)  Lipitor 40 Mg Tabs (Atorvastatin Calcium) .... Once Daily 5)  Temazepam 15 Mg Caps (Temazepam) .Marland Kitchen.. 1-2 At Bedtime As Needed 6)  Hydrocodone-Acetaminophen 5-500 Mg Tabs (Hydrocodone-Acetaminophen) .... As Needed 7)  Amlodipine Besylate 5 Mg Tabs (Amlodipine Besylate) .... Once Daily 8)  Lorazepam 1 Mg Tabs (Lorazepam) .... Once Daily 9)  Humulin 70/30 70-30 % Susp (Insulin Isophane & Regular) .... 35 U Two Times A Day 10)  Byetta 10 Mcg Pen 10 Mcg/0.27ml Soln (Exenatide) .... Two Times A Day 11)  Carvedilol 3.125 Mg Tabs (Carvedilol) .... Two Times A Day 12)  Bentyl 10 Mg Caps (Dicyclomine Hcl) .... Take 1 Prior To Meals As Needed  Allergies: 1)  ! Codeine  Past History:  Past Medical History: Last updated: 01/25/2011 htn diabetes cholesterol acid reflux colon adenomatous polyps diverticulitis thinks treated outpt Abx 5-6 times in her life breast ca 1990,    Dr Jena Gauss 2004->deverticulosis, inflamm polyp, adenomatous polyp, int hemorrhoids:  overdue for surveillance  EGD/colon 11/2010: EGD nl with fundal gland-type polyps; scattered pancolonic diverticula, tubular adenoma  Review of Systems General:  Denies fever, chills, and anorexia. Eyes:  Denies blurring, irritation, and discharge. ENT:  Denies sore throat, hoarseness, and difficulty swallowing. CV:  Denies chest pains and syncope. Resp:  Denies dyspnea at rest and wheezing. GI:  See HPI. GU:  Denies urinary burning and urinary frequency. MS:  Denies joint pain / LOM, joint swelling, and joint stiffness. Derm:  Denies rash, itching, and dry skin. Neuro:  Denies weakness and syncope. Psych:  Denies depression and anxiety.  Physical Exam  General:  Well developed, well nourished, no acute distress. Head:  Normocephalic and atraumatic. Eyes:  PERRLA, no icterus. Abdomen:  +BS, soft, non-tender, non-distended. obese. no HSM. no rebound or guarding. Msk:  Symmetrical with no gross deformities. Normal posture. Pulses:  Normal pulses noted. Neurologic:  Alert and  oriented x4;  grossly normal neurologically. Skin:  Intact without significant lesions or rashes. Psych:  Alert and cooperative. Normal mood and affect.   Impression & Recommendations:  Problem # 1:  DIARRHEA (ICD-20.92)  65 year old female with IBS-D. Doing well with addition of Bentyl. Taking fiber daily. Significant improvement from last visit. No melena or brbpr.  Continue Bentyl as needed Continue fiber daily F/U in 6 mos or sooner as needed  Orders: Est. Patient Level II (04540) Prescriptions: BENTYL 10 MG CAPS (DICYCLOMINE HCL) take 1 prior to  meals as needed  #90 x 3   Entered and Authorized by:   Gerrit Halls NP   Signed by:   Gerrit Halls NP on 03/06/2011   Method used:   Faxed to ...       [facility]* (retail)       [address]       [place], [place]  [address]       Ph: [phone] or [phone]       Fax: [phone]   RxID:    [id number]    Orders Added: 1)  Est. Patient Level II [50093]  Appended Document: IBS FU IN 6 WEEKS 6 month f/u opv is in the computer

## 2011-04-10 NOTE — Telephone Encounter (Signed)
Document opened in error

## 2011-05-11 NOTE — Op Note (Signed)
Ann Fuller, Ann Fuller           ACCOUNT NO.:  [id number]   MEDICAL RECORD NO.:  [id number]          PATIENT TYPE:  INP   LOCATION:  2899                         FACILITY:  [facility]   PHYSICIAN:  Coletta Memos, M.D.     DATE OF BIRTH:  [birth year]   DATE OF PROCEDURE:  11/20/2006  DATE OF DISCHARGE:                               OPERATIVE REPORT   PREOPERATIVE DIAGNOSES:  1. Lumbar spondylosis at L4-5.  2. Lumbar stenosis at L4-5,  3. Spondylolisthesis, L4-5, acquired.   POSTOPERATIVE DIAGNOSES:  1. Lumbar spondylosis at L4-5.  2. Lumbar stenosis at L4-5,  3. Spondylolisthesis, L4-5, acquired.   PROCEDURE:  1. Posterior lumbar interbody arthrodesis, L4-5, with morselized      autograft and allograft.  2. Posterolateral arthrodesis, L4-5, with morselized allograft and      Infuse, Infuse also used on the intervertebral space.  3. Nonsegmental pedicle screw fixation, L4-5.   COMPLICATIONS:  None.   SURGEON:  Coletta Memos, M.D.   ASSISTANT:  Hewitt Shorts, M.D.   INDICATIONS:  [name] has been my patient for a number of years  and we followed her for the stenosis at L4-5.  She was able to get  through with conservative treatment, but at this time decided that she  was in too much pain and wanted to proceed with operative decompression.  I therefore recommended and she agreed to operative decompression.   DESCRIPTION OF PROCEDURE:  She was brought to the operating room,  intubated and placed under a general anesthetic without difficulty.  She  then had a Foley catheter placed under sterile conditions.  She was then  rolled prone onto a Wilson frame and all pressure points were properly  padded.  Her back was prepped and she was draped in a sterile fashion.  I infiltrated 20 mL of 0.5% lidocaine of 1:200,000 strength into the  subcutaneous region in the lumbar area.  I then opened the skin with a  #10 blade and took this down to the thoracolumbar fascia.  I exposed  the  lamina of L3, L4 and L5 bilaterally.   I then proceeded with the posterior lumbar interbody arthrodesis by  performing hemilaminectomies of L4 bilaterally using a high-speed drill  and a curette.  I removed the ligamentum flavum, which was partially  calcified, certainly very thick; I did this until I had the thecal sac  very well decompressed.  I then proceeded with a diskectomy, first on  the right side, than on the left.  I prepared the endplates for  arthrodesis.  Then using morselized allograft and Infuse, I placed those  into the intervertebral space.  I then placed two 11-mm Synthes PEAK  cages into that space without difficulty.   I then prepared for the posterolateral arthrodesis.  I had previously  exposed the transverse processes.  I decorticated, then placed bone  graft and Infuse out laterally.   I then placed the pedicle screws using fluoroscopic guidance, 2 screws  were placed, 2 screws in L4, two screws in L5 and with fluoroscopic  guidance, they were placed and were in good position.  They were  connected with a rod and then secured into position.  I then closed the  wound in layered fashion using Vicryl sutures.  The skin edges were  reapproximated with 3-0 nylon.  She tolerated the procedure well,           ______________________________  Coletta Memos, M.D.     KC/MEDQ  D:  11/20/2006  T:  11/21/2006  Job:  [id number]

## 2011-05-11 NOTE — Procedures (Signed)
Ann Fuller, Ann Fuller           ACCOUNT NO.:  [id number]   MEDICAL RECORD NO.:  [id number]         PATIENT TYPE:  REC   LOCATION:  RAD                           FACILITY:  [facility]   PHYSICIAN:  Kingsley Callander. Ouida Sills, MD       DATE OF BIRTH:  Sep 26, 1946   DATE OF PROCEDURE:  01/18/2005  DATE OF DISCHARGE:                                    STRESS TEST   PROCEDURE:  Stress test.   DESCRIPTION OF PROCEDURE:  The patient exercised 3 minutes and 59 seconds  into stage I of the Bruce protocol obtaining a maximal heart rate of 152  (93% of the age predicted maximal heart rate) and a work load of 4.6 METS  and discontinued exercise after becoming short of breath.  There were no  arrhythmias.  There were no symptoms of chest pain.  This test was performed  because of recent chest tightness.  She developed 1 mm down sloping ST  segment depression in the inferolateral leads during recovery.  Myoview  images are pending.  Baseline electrocardiogram revealed a normal sinus  rhythm at 91 beats/minute.   IMPRESSION:  Abnormal exercise stress test.  Myoview images pending.      ROF/MEDQ  D:  01/18/2005  T:  01/18/2005  Job:  [id number]

## 2011-05-11 NOTE — Procedures (Signed)
NAME:  ELLE, VEZINA                     ACCOUNT NO.:  [id number]   MEDICAL RECORD NO.:  [id number]                   PATIENT TYPE:  OUT   LOCATION:  RAD                                  FACILITY:  [facility]   PHYSICIAN:  Dani Gobble, MD                    DATE OF BIRTH:  02/04/1946   DATE OF PROCEDURE:  DATE OF DISCHARGE:                                  ECHOCARDIOGRAM   INDICATION:  Ms. Champagne is a 65 year old female with a past medical  history of hypertension and diabetes who is status post left breast implant,  found to have a murmur.   TECHNICAL QUALITY:  The technical quality of the study is quite limited  secondary to the presence of a left-breast implant.   FINDINGS:  1. The aorta is within normal limits at 2.5 cm.  2. The left atrium is mildly dilated.  The patient appeared to be in sinus     rhythm during this procedure.  No obvious clots or masses were appreciated.  1. The intraventricular septum was mildly thickened, particularly in the     basal portion, while the remainder of the posterior wall and septum     appeared to be within normal limits in thickness.  2. The aortic valve was mildly thickened, but with normal leaflet excursion.     Trivial aortic insufficiency was noted.  Doppler interrogation of the     aortic valve revealed a peak velocity of 1.6 meters per second     corresponding to a peak gradient of 11 mmHg and a mean gradient of 7     mmHg.  3. The mitral valve appeared to be grossly structurally normal.  No obvious     mitral valve prolapse was noted.  Mild mitral regurgitation was noted.  4. The pulmonic valve was not visualized, nor was the tricuspid valve     visualized.  5. The left ventricle appeared to be normal in size with the LVIDD measured     at 4.4 cm and the LVISD measured at 2.9 cm.  6. Overall left ventricular systolic function appeared to be normal.  I can     not comment on the presence or absence of regional wall motion  abnormalities given the technical limitations of the study.  The right     sided structures were viewed only in the subcostal view, and there were     no obvious abnormalities noted.  7. In the subcostal view, there is a suggestion on color-flow Doppler of a     left-to-right either small PSO or ASD with left-to-right shunting;     however, ____ that this is aberrant IVC inflow as there are no direct or     indirect sequelae such as right atrial enlargement or turbulence noted.   IMPRESSION:  1. Technically difficult study secondary to the presence of left-breast     implants.  2.  Mild left atrial enlargement.  3. Basal septal hypertrophy.  4. Mild aortic sclerosis without stenosis.  5. Mild mitral regurgitation.  6. Normal left ventricular size and overall systolic function.  I can not     exclude the possibility of regional wall motion abnormalities on the     study given the technical limitations.  7. In the subcostal view, there is a suggestion on color-flow Doppler of a     left-to-right either small PSO or ASD     with left-to-right shunting; however, ____ that this is aberrant IVC     inflow as there are no direct or indirect sequelae such as right atrial     enlargement or turbulence noted.  8. Consider transesophageal echocardiogram for improved delineation of the     cardiac structures.      ___________________________________________                                            Dani Gobble, MD   AB/MEDQ  D:  03/09/2004  T:  03/09/2004  Job:  [id number]

## 2011-05-11 NOTE — Discharge Summary (Signed)
NAMEEULALAH, RUPERT           ACCOUNT NO.:  [id number]   MEDICAL RECORD NO.:  [id number]          PATIENT TYPE:  INP   LOCATION:  3002                         FACILITY:  [facility]   PHYSICIAN:  Coletta Memos, M.D.     DATE OF BIRTH:  1946-11-27   DATE OF ADMISSION:  11/20/2006  DATE OF DISCHARGE:  11/23/2006                               DISCHARGE SUMMARY   ADMITTING DIAGNOSIS:  Lumbar stenosis, lumbar spondylosis L4-5,  spondylolisthesis L4-5.   DISCHARGE DIAGNOSIS:  Lumbar stenosis, lumbar spondylosis L4-5,  spondylolisthesis L4-5.   PROCEDURE:  Posterior lumbar interbody arthrodesis L4-5, posterolateral  arthrodesis L4-5, pedicle screw fixation L4-5.   COMPLICATIONS:  None.   SURGEON:  Dr. Franky Macho.   DISCHARGE DESTINATION:  Home.   DISCHARGE STATUS:  Alive and well.  Wound clean, dry, no signs of  infection.   MEDICATIONS:  Percocet and Flexeril.   HOSPITAL COURSE:  Mrs. Feldstein is a 65 year old who presented with  severe pain in the lumbar spine secondary to stenosis.  I therefore  recommended and she agreed to undergo a lumbar decompression with  fixation.  She was admitted, taken to the operating room and had an  uncomplicated procedure.  Postoperatively she has done well.  The wound  is clean and dry.  There are no signs of infection.  She will be  discharged home.  She was given instructions to call my office this  coming Monday for suture removal.           ______________________________  Coletta Memos, M.D.     KC/MEDQ  D:  11/22/2006  T:  11/24/2006  Job:  [id number]

## 2011-07-16 ENCOUNTER — Encounter: Payer: Self-pay | Admitting: Internal Medicine

## 2011-07-30 ENCOUNTER — Telehealth: Payer: Self-pay

## 2011-07-30 MED ORDER — OMEPRAZOLE 20 MG PO CPDR: 20.0000 mg | DELAYED_RELEASE_CAPSULE | Freq: Every day | ORAL | Status: DC

## 2011-07-30 NOTE — Telephone Encounter (Signed)
Pt called- she cannot use her dexilant savings card anymore. Pt needs PA for dexilant but has not tried omeprazole. Pt stated ok to sent rx to [facility] pharmacy.

## 2011-08-28 ENCOUNTER — Other Ambulatory Visit (HOSPITAL_COMMUNITY): Payer: Self-pay | Admitting: Oncology

## 2011-08-28 DIAGNOSIS — Z9010 Acquired absence of unspecified breast and nipple: Secondary | ICD-10-CM

## 2011-08-28 DIAGNOSIS — Z1231 Encounter for screening mammogram for malignant neoplasm of breast: Secondary | ICD-10-CM

## 2011-09-03 ENCOUNTER — Encounter: Payer: Self-pay | Admitting: Internal Medicine

## 2011-09-03 ENCOUNTER — Ambulatory Visit (INDEPENDENT_AMBULATORY_CARE_PROVIDER_SITE_OTHER): Payer: Medicare Other | Admitting: Internal Medicine

## 2011-09-03 ENCOUNTER — Ambulatory Visit: Payer: Medicare Other | Admitting: Internal Medicine

## 2011-09-03 VITALS — BP 123/61 | HR 82 | Temp 97.7°F | Ht 63.0 in | Wt 237.8 lb

## 2011-09-03 DIAGNOSIS — K589 Irritable bowel syndrome without diarrhea: Secondary | ICD-10-CM

## 2011-09-03 NOTE — Progress Notes (Signed)
Cc to PCP 

## 2011-09-03 NOTE — Progress Notes (Signed)
Primary Care Physician:  Carylon Perches, MD Primary Gastroenterologist:  Dr. Jena Gauss  Pre-Procedure History & Physical: HPI:  Ann Fuller is a 65 y.o. female here for followup of irritable bowel syndrome and dyspepsia/GERD. This nice lady was doing very well with herr GI tract symptoms on brief course of Dexilant . However, her insurance  would not provide a benefit and consequently she switched  to generic omeprazole 20 mg orally daily. This is been associated with recurrence in her upper GI tract symptoms of dyspepsia and reflux. Her bowel symptoms are intermittent with alternating constipation and diarrhea with a slant towards diarrhea. She takes one day until tablet each morning has occasional bouts of diarrhea and rare episodes of incontinence. History of colonic adenomas removed at colonoscopy last year. She'll be due for surveillance exam in 2016. Prior EGD negative for Barrett's esophagus or other significant pathology. CT last year demonstrated diverticulosis but no evidence of diverticulitis  Past Medical History  Diagnosis Date  . HTN (hypertension)   . Diabetes mellitus   . Acid reflux   . Adenomatous colon polyp   . Diverticulitis   . Breast cancer 1990  . Internal hemorrhoid     Past Surgical History  Procedure Date  . Lower back 2007  . Cholecystectomy   . Tubal ligation   . Left mastectomy   . Esophagogastroduodenoscopy 12/13/2010  . Colonoscopy 12/13/2010  . Colonoscopy 07/13/2003    Prior to Admission medications   Medication Sig Start Date End Date Taking? Authorizing Provider  amLODipine (NORVASC) 5 MG tablet Take 5 mg by mouth daily.     Yes Historical Provider, MD  atorvastatin (LIPITOR) 40 MG tablet Take 40 mg by mouth daily.     Yes Historical Provider, MD  carvedilol (COREG) 3.125 MG tablet Take 3.125 mg by mouth 2 (two) times daily with a meal.     Yes Historical Provider, MD  chlorthalidone (HYGROTON) 25 MG tablet Take 25 mg by mouth daily.     Yes  Historical Provider, MD  dicyclomine (BENTYL) 10 MG capsule Take 10 mg by mouth as needed. Take one capsule by mouth prior to meals as needed    Yes Historical Provider, MD  exenatide (BYETTA 10 MCG PEN) 10 MCG/0.04ML SOLN Inject into the skin 2 (two) times daily with a meal.     Yes Historical Provider, MD  HYDROcodone-acetaminophen (VICODIN) 5-500 MG per tablet Take 1 tablet by mouth as needed.     Yes Historical Provider, MD  insulin NPH-insulin regular (HUMULIN 70/30) (70-30) 100 UNIT/ML injection Inject into the skin. 35 units 2 times a day    Yes Historical Provider, MD  LORazepam (ATIVAN) 1 MG tablet Take 1 mg by mouth daily.     Yes Historical Provider, MD  losartan (COZAAR) 100 MG tablet Take 100 mg by mouth daily.     Yes Historical Provider, MD  omeprazole (PRILOSEC) 20 MG capsule Take 1 capsule (20 mg total) by mouth daily. 07/30/11 07/29/12 Yes Lorenza Burton, NP  temazepam (RESTORIL) 15 MG capsule Take 15 mg by mouth at bedtime as needed. Take 1-2 capsules at bedtime as needed    Yes Historical Provider, MD  esomeprazole (NEXIUM) 40 MG capsule Take 40 mg by mouth daily before breakfast.      Historical Provider, MD  predniSONE (DELTASONE) 10 MG tablet Take 10 mg by mouth. Take 1/2 tablet by mouth x 3 days     Historical Provider, MD    Allergies as of 09/03/2011 -  Review Complete 09/03/2011  Allergen Reaction Noted  . Codeine Swelling     Family History  Problem Relation Age of Onset  . Colon cancer Mother     History   Social History  . Marital Status: Married    Spouse Name: N/A    Number of Children: N/A  . Years of Education: N/A   Occupational History  . Not on file.   Social History Main Topics  . Smoking status: Never Smoker   . Smokeless tobacco: Not on file  . Alcohol Use: No  . Drug Use: No  . Sexually Active: Not on file   Other Topics Concern  . Not on file   Social History Narrative  . No narrative on file    Review of Systems: See HPI, otherwise  negative ROS  Physical Exam: BP 123/61  Pulse 82  Temp(Src) 97.7 F (36.5 C) (Temporal)  Ht 5\' 3"  (1.6 m)  Wt 237 lb 12.8 oz (107.865 kg)  BMI 42.12 kg/m2 General:   Alert,  Well-developed, well-nourished, pleasant and cooperative in NAD Head:  Normocephalic and atraumatic. Eyes:  Sclera clear, no icterus.   Conjunctiva pink. Ears:  Normal auditory acuity. Nose:  No deformity, discharge,  or lesions. Mouth:  No deformity or lesions, dentition normal. Neck:  Supple; no masses or thyromegaly. Lungs:  Clear throughout to auscultation.   No wheezes, crackles, or rhonchi. No acute distress. Heart:  Regular rate and rhythm; no murmurs, clicks, rubs,  or gallops. Abdomen:  Obese Soft, nontender and nondistended. No masses, hepatosplenomegaly or hernias noted. Normal bowel sounds, without guarding, and without rebound.   Msk:  Symmetrical without gross deformities. Normal posture. Pulses:  Normal pulses noted. Extremities:  Without clubbing or edema. Neurologic:  Alert and  oriented x4;  grossly normal neurologically. Skin:  Intact without significant lesions or rashes. Cervical Nodes:  No significant cervical adenopathy. Psych:  Alert and cooperative. Normal mood and affect.

## 2011-09-03 NOTE — Patient Instructions (Addendum)
Try omeprazole 20 mg orally twice daily for the next week or 2. If this is not satisfactory in controlling her symptoms, go ahead and start taking AcipHex 20 mg once daily for 2 weeks and then call and let us know that  how that's working for you.  Take Align one capsule daily to balance out your bowel function - free samples provided.  You will need a repeat colonoscopy in 5 years.  We'll plan to see you back in the office in 3 months and as needed.

## 2011-09-03 NOTE — Assessment & Plan Note (Addendum)
Ann Fuller is not doing nearly as well with omeprazole as she did with  Dexilatnt.  Irritable bowel syndrome, currently, diarrhea predominant. Some help with the detail taken each morning as scheduled medication. She may well benefit with the addition of a probiotic to her regimen. No alarm symptoms.   Recommendations: Increase omeprazole to 20 mg orally twice daily before breakfast and supper x2 weeks. If that is not effective in controlling her upper GI tract symptoms then will try a two-week course of either Dexilant or AcipHex (depending on our sample supply).  Continue  Bentyl each morning.  We'll add a probiotic in the way of Align -samples provided.  Plan see this nice lady back in the office in 3 months.    Patient instructions:  Try omeprazole 20 mg orally twice daily for the next week or 2. If this is not satisfactory in controlling her symptoms, go ahead and start taking AcipHex 20 mg once daily for 2 weeks and then call and let us know that  how that's working for you.  Take Align one capsule daily to balance out your bowel function - free samples provided.  You will need a repeat colonoscopy in 5 years.  We'll plan to see you back in the office in 3 months and as needed.

## 2011-09-07 ENCOUNTER — Ambulatory Visit: Payer: Medicare Other | Admitting: Internal Medicine

## 2011-09-24 LAB — COMPREHENSIVE METABOLIC PANEL
ALT: 19
Alkaline Phosphatase: 77
BUN: 14
Calcium: 9.3
Chloride: 99
Creatinine, Ser: 0.66
GFR calc Af Amer: >60
Total Bilirubin: 0.4
Total Protein: 7.6

## 2011-09-24 LAB — CBC
HCT: 36.9
Hemoglobin: 12.4
MCHC: 33.6
Platelets: 295
RBC: 4.16
WBC: 7.2

## 2011-09-24 LAB — DIFFERENTIAL
Lymphocytes Relative: 22
Lymphs Abs: 1.6
Monocytes Relative: 7

## 2011-10-04 ENCOUNTER — Ambulatory Visit
Admission: RE | Admit: 2011-10-04 | Discharge: 2011-10-04 | Disposition: A | Discharge status: Home / Self Care | Payer: Medicare Other | Source: Ambulatory Visit | Attending: Oncology | Admitting: Oncology

## 2011-10-04 DIAGNOSIS — Z9010 Acquired absence of unspecified breast and nipple: Secondary | ICD-10-CM

## 2011-10-04 DIAGNOSIS — Z1231 Encounter for screening mammogram for malignant neoplasm of breast: Secondary | ICD-10-CM

## 2011-10-23 ENCOUNTER — Telehealth: Payer: Self-pay | Admitting: Internal Medicine

## 2011-10-23 NOTE — Telephone Encounter (Signed)
Patient says she cant get the Dilantin her insurance wont pay so shes taking Omeprazole twice a day instead an its not helping and stomach is swelling and going back an forth between constipation and diarrhea please advise ???

## 2011-10-23 NOTE — Telephone Encounter (Signed)
Pt was taking dexilant

## 2011-10-25 NOTE — Telephone Encounter (Signed)
Spoke with pt. She is requesting we do a PA for dexilant. Will start paperwork.

## 2011-11-22 NOTE — Progress Notes (Unsigned)
Tried to call pt this am to inform her that her PA for Dexilant was finally approved. LMOM.

## 2011-11-22 NOTE — Progress Notes (Signed)
RMR is aware that pt has not been on Dexilant for the last month. She has been taking omeprazole which gives her diarrhea. She has appt with RMR tomorrow.

## 2011-11-22 NOTE — Progress Notes (Signed)
Pt called back and left voicemail- tried to call pt again and got her voicemail. Left message informing her that her medication was approved and reminding her that she has appointment in the morning with RMR.

## 2011-11-23 ENCOUNTER — Ambulatory Visit (INDEPENDENT_AMBULATORY_CARE_PROVIDER_SITE_OTHER): Payer: Medicare Other | Admitting: Internal Medicine

## 2011-11-23 ENCOUNTER — Encounter: Payer: Self-pay | Admitting: Internal Medicine

## 2011-11-23 VITALS — BP 112/68 | HR 82 | Temp 97.4°F | Ht 63.0 in | Wt 239.4 lb

## 2011-11-23 DIAGNOSIS — K219 Gastro-esophageal reflux disease without esophagitis: Secondary | ICD-10-CM

## 2011-11-23 NOTE — Progress Notes (Signed)
Patient ID: Ann Fuller, female   DOB: 02-29-1946, 65 y.o.   MRN: [id number] Primary Care Physician:  Carylon Perches, MD Primary Gastroenterologist:  Dr.   Pre-Procedure History & Physical: HPI:  Ann Fuller is a 65 y.o. female here for followup of GERD. She was tried on AcipHex and back to omeprazole/Nexium since her last office visit. These agents have not been as effective in controlling her GERD she's had some vague upper abdominal discomfort and tendency towards diarrhea over the past 2 months. We have worked and finally got [employer] - Approval late effective today.  She recently joined the [facility] and is going to be doing  water exercises. She has not been able lose any weight as of yet. She is due for surveillance colonoscopy and history of colonic adenomas 2016. No dysphagia or GI bleeding.  Past Medical History  Diagnosis Date  . HTN (hypertension)   . Diabetes mellitus   . Acid reflux   . Adenomatous colon polyp   . Diverticulitis   . Breast cancer 1990  . Internal hemorrhoid   . IBS (irritable bowel syndrome)   . GERD (gastroesophageal reflux disease)   . Dyspepsia   . Hiatal hernia   . Tubular adenoma 11/2010    Past Surgical History  Procedure Date  . Lower back 2007  . Cholecystectomy   . Tubal ligation   . Left mastectomy   . Esophagogastroduodenoscopy 12/13/2010    hiatal hernia  . Colonoscopy 12/13/2010    tubular adenoma  . Colonoscopy 07/13/2003    Prior to Admission medications   Medication Sig Start Date End Date Taking? Authorizing Provider  amLODipine (NORVASC) 5 MG tablet Take 5 mg by mouth daily.     Yes Historical Provider, MD  atorvastatin (LIPITOR) 40 MG tablet Take 40 mg by mouth daily.     Yes Historical Provider, MD  carvedilol (COREG) 3.125 MG tablet Take 3.125 mg by mouth 2 (two) times daily with a meal.     Yes Historical Provider, MD  chlorthalidone (HYGROTON) 25 MG tablet Take 25 mg by mouth daily.     Yes Historical Provider, MD    dexlansoprazole (DEXILANT) 60 MG capsule Take 60 mg by mouth daily.     Yes Historical Provider, MD  dicyclomine (BENTYL) 10 MG capsule Take 10 mg by mouth as needed. Take one capsule by mouth prior to meals as needed    Yes Historical Provider, MD  exenatide (BYETTA 10 MCG PEN) 10 MCG/0.04ML SOLN Inject into the skin 2 (two) times daily with a meal.     Yes Historical Provider, MD  HYDROcodone-acetaminophen (VICODIN) 5-500 MG per tablet Take 1 tablet by mouth as needed.     Yes Historical Provider, MD  insulin NPH-insulin regular (HUMULIN 70/30) (70-30) 100 UNIT/ML injection Inject 38 Units into the skin 2 (two) times daily with a meal. 35 units 2 times a day   Yes Historical Provider, MD  LORazepam (ATIVAN) 1 MG tablet Take 1 mg by mouth daily.     Yes Historical Provider, MD  losartan (COZAAR) 100 MG tablet Take 100 mg by mouth daily.     Yes Historical Provider, MD  predniSONE (DELTASONE) 10 MG tablet Take 10 mg by mouth. Take 1/2 tablet by mouth x 3 days    Yes Historical Provider, MD  temazepam (RESTORIL) 15 MG capsule Take 15 mg by mouth at bedtime as needed. Take 1-2 capsules at bedtime as needed    Yes Historical Provider, MD  esomeprazole (NEXIUM) 40 MG capsule Take 40 mg by mouth daily before breakfast.      Historical Provider, MD  omeprazole (PRILOSEC) 20 MG capsule Take 1 capsule (20 mg total) by mouth daily. 07/30/11 07/29/12  Lorenza Burton, NP    Allergies as of 11/23/2011 - Review Complete 11/23/2011  Allergen Reaction Noted  . Codeine Swelling     Family History  Problem Relation Age of Onset  . Colon cancer Mother     History   Social History  . Marital Status: Married    Spouse Name: N/A    Number of Children: N/A  . Years of Education: N/A   Occupational History  . Not on file.   Social History Main Topics  . Smoking status: Never Smoker   . Smokeless tobacco: Not on file  . Alcohol Use: No  . Drug Use: No  . Sexually Active: Not on file   Other Topics  Concern  . Not on file   Social History Narrative  . No narrative on file    Review of Systems: See HPI, otherwise negative ROS  Physical Exam: BP 112/68  Pulse 82  Temp(Src) 97.4 F (36.3 C) (Temporal)  Ht 5\' 3"  (1.6 m)  Wt 239 lb 6.4 oz (108.591 kg)  BMI 42.41 kg/m2 General:   Alert,  Well-developed, well-nourished, pleasant and cooperative in NAD Skin:  Intact without significant lesions or rashes. Eyes:  Sclera clear, no icterus.   Conjunctiva pink. Ears:  Normal auditory acuity. Nose:  No deformity, discharge,  or lesions. Mouth:  No deformity or lesions. Neck:  Supple; no masses or thyromegaly. No significant cervical adenopathy. Abdomen: Obese Non-distended, normal bowel sounds.  Soft and nontender without appreciable mass or hepatosplenomegaly.  Pulses:  Normal pulses noted. Extremities:  Without clubbing or edema.  Impression/Plan:

## 2011-11-23 NOTE — Assessment & Plan Note (Signed)
Reflux symptoms suboptimally controlled with omeprazole/ esomeprazole. I believe she's had some diarrhea possibly related to these agents.  Recommendations: Resume Dexilant 60 mg daily. Antireflux lifestyle/diet encourage. I've asked her to try to lose 20 pounds in the next 12 months. Highly recommended following through the [facility] with regular  water based exercises.  Office followup with [place] in 6 months

## 2011-11-23 NOTE — Patient Instructions (Signed)
Dexilant 60 mg daily  Encourage exercise and weight loss  Office visit in 6 months

## 2011-11-30 ENCOUNTER — Ambulatory Visit: Payer: Medicare Other | Admitting: Internal Medicine

## 2012-03-25 ENCOUNTER — Ambulatory Visit (HOSPITAL_COMMUNITY)
Admission: RE | Admit: 2012-03-25 | Discharge: 2012-03-25 | Disposition: A | Discharge status: Home / Self Care | Payer: BC Managed Care – PPO | Source: Ambulatory Visit | Attending: Internal Medicine | Admitting: Internal Medicine

## 2012-03-25 ENCOUNTER — Other Ambulatory Visit (HOSPITAL_COMMUNITY): Payer: Self-pay | Admitting: Internal Medicine

## 2012-03-25 DIAGNOSIS — Z9010 Acquired absence of unspecified breast and nipple: Secondary | ICD-10-CM | POA: Insufficient documentation

## 2012-03-25 DIAGNOSIS — J40 Bronchitis, not specified as acute or chronic: Secondary | ICD-10-CM

## 2012-03-25 DIAGNOSIS — R0989 Other specified symptoms and signs involving the circulatory and respiratory systems: Secondary | ICD-10-CM | POA: Insufficient documentation

## 2012-03-25 DIAGNOSIS — Z853 Personal history of malignant neoplasm of breast: Secondary | ICD-10-CM | POA: Insufficient documentation

## 2012-03-25 DIAGNOSIS — R0602 Shortness of breath: Secondary | ICD-10-CM | POA: Insufficient documentation

## 2012-03-25 DIAGNOSIS — I10 Essential (primary) hypertension: Secondary | ICD-10-CM | POA: Insufficient documentation

## 2012-03-25 DIAGNOSIS — R059 Cough, unspecified: Secondary | ICD-10-CM | POA: Insufficient documentation

## 2012-03-25 DIAGNOSIS — R05 Cough: Secondary | ICD-10-CM | POA: Insufficient documentation

## 2012-03-25 DIAGNOSIS — M47814 Spondylosis without myelopathy or radiculopathy, thoracic region: Secondary | ICD-10-CM | POA: Insufficient documentation

## 2012-03-25 DIAGNOSIS — E119 Type 2 diabetes mellitus without complications: Secondary | ICD-10-CM | POA: Insufficient documentation

## 2012-04-07 ENCOUNTER — Other Ambulatory Visit: Payer: Self-pay

## 2012-04-07 MED ORDER — DICYCLOMINE HCL 10 MG PO CAPS: 10.0000 mg | ORAL_CAPSULE | ORAL | Status: DC | PRN

## 2012-04-07 NOTE — Telephone Encounter (Signed)
Due for six-month followup GERD

## 2012-04-08 ENCOUNTER — Encounter: Payer: Self-pay | Admitting: Internal Medicine

## 2012-04-08 NOTE — Telephone Encounter (Signed)
Pt is aware of OV on 05/06/12 @ 230pm with RMR and appt card was mailed

## 2012-05-02 ENCOUNTER — Ambulatory Visit (INDEPENDENT_AMBULATORY_CARE_PROVIDER_SITE_OTHER): Payer: BC Managed Care – PPO | Admitting: Critical Care Medicine

## 2012-05-02 VITALS — BP 118/64 | HR 65 | Temp 98.0°F | Ht 64.0 in | Wt 242.6 lb

## 2012-05-02 DIAGNOSIS — J45909 Unspecified asthma, uncomplicated: Secondary | ICD-10-CM | POA: Insufficient documentation

## 2012-05-02 DIAGNOSIS — R05 Cough: Secondary | ICD-10-CM

## 2012-05-02 MED ORDER — BENZONATATE 100 MG PO CAPS: ORAL_CAPSULE | ORAL | Status: DC

## 2012-05-02 MED ORDER — DEXTROMETHORPHAN POLISTIREX 30 MG/5ML PO LQCR: ORAL | Status: DC

## 2012-05-02 MED ORDER — PREDNISONE 10 MG PO TABS: ORAL_TABLET | ORAL | Status: DC

## 2012-05-02 MED ORDER — BUDESONIDE-FORMOTEROL FUMARATE 160-4.5 MCG/ACT IN AERO: 2.0000 | INHALATION_SPRAY | Freq: Two times a day (BID) | RESPIRATORY_TRACT | Status: DC

## 2012-05-02 NOTE — Patient Instructions (Signed)
Start Symbicort two puff twice daily Start prednisone 10mg  Take 4 for three days 3 for three days 2 for three days 1 for three days and stop Use cough protocol with Delsym/ tessalon  Follow reflux diet Return 2 months

## 2012-05-02 NOTE — Progress Notes (Signed)
Subjective:    Patient ID: Ann Fuller, female    DOB: [birth year], 66 y.o.   MRN: [id number]  HPI Comments: Hx of cough for years and Dx bronchitis 5x in past year  Cough This is a recurrent problem. The current episode started more than 1 month ago. The problem has been gradually worsening. The problem occurs every few minutes. The cough is productive of sputum and productive of purulent sputum. Associated symptoms include chest pain, hemoptysis, shortness of breath and wheezing. Pertinent negatives include no chills, ear pain, fever, headaches, heartburn, myalgias, nasal congestion, postnasal drip, rash, rhinorrhea or sore throat. The symptoms are aggravated by lying down, exercise and cold air. Risk factors for lung disease include smoking/tobacco exposure (passive smoke from father). Her past medical history is significant for bronchitis and environmental allergies. There is no history of asthma, bronchiectasis, COPD, emphysema or pneumonia. skin testing many years ago ?results    Past Medical History  Diagnosis Date  . HTN (hypertension)   . Diabetes mellitus   . Adenomatous colon polyp   . Diverticulitis   . Breast cancer 1990  . Internal hemorrhoid   . IBS (irritable bowel syndrome)   . GERD (gastroesophageal reflux disease)   . Dyspepsia   . Hiatal hernia   . Tubular adenoma 11/2010     Family History  Problem Relation Age of Onset  . Colon cancer Mother      History   Social History  . Marital Status: Married    Spouse Name: N/A    Number of Children: N/A  . Years of Education: N/A   Occupational History  . Not on file.   Social History Main Topics  . Smoking status: Never Smoker   . Smokeless tobacco: Never Used  . Alcohol Use: No  . Drug Use: No  . Sexually Active: Not on file   Other Topics Concern  . Not on file   Social History Narrative  . No narrative on file     Allergies  Allergen Reactions  . Ceftin (Cefuroxime Axetil)     swelling    . Codeine Swelling  . Nexium (Esomeprazole Magnesium)     diarrhea  . Omnicef (Cefdinir)     diarrhea  . Sulfa Antibiotics     rash     Outpatient Prescriptions Prior to Visit  Medication Sig Dispense Refill  . amLODipine (NORVASC) 5 MG tablet Take 5 mg by mouth daily.        Marland Kitchen atorvastatin (LIPITOR) 40 MG tablet Take 40 mg by mouth daily.        . chlorthalidone (HYGROTON) 25 MG tablet Take 25 mg by mouth daily.        Marland Kitchen dexlansoprazole (DEXILANT) 60 MG capsule Take 60 mg by mouth daily.        Marland Kitchen dicyclomine (BENTYL) 10 MG capsule Take 1 capsule (10 mg total) by mouth as needed. Take one capsule by mouth prior to meals as needed  90 capsule  0  . exenatide (BYETTA 10 MCG PEN) 10 MCG/0.04ML SOLN Inject into the skin 2 (two) times daily with a meal.        . HYDROcodone-acetaminophen (VICODIN) 5-500 MG per tablet Take 1 tablet by mouth every 4 (four) hours as needed.       Marland Kitchen LORazepam (ATIVAN) 1 MG tablet Take 1 mg by mouth 2 (two) times daily as needed.       Marland Kitchen losartan (COZAAR) 100 MG tablet Take 100 mg by  mouth daily.        . temazepam (RESTORIL) 15 MG capsule Take 15 mg by mouth at bedtime as needed. Take 1-2 capsules at bedtime as needed       . carvedilol (COREG) 3.125 MG tablet Take 3.125 mg by mouth 2 (two) times daily with a meal.        . esomeprazole (NEXIUM) 40 MG capsule Take 40 mg by mouth daily before breakfast.        . insulin NPH-insulin regular (HUMULIN 70/30) (70-30) 100 UNIT/ML injection Inject 38 Units into the skin 2 (two) times daily with a meal. 35 units 2 times a day      . omeprazole (PRILOSEC) 20 MG capsule Take 1 capsule (20 mg total) by mouth daily.  30 capsule  11  . predniSONE (DELTASONE) 10 MG tablet Take 10 mg by mouth. Take 1/2 tablet by mouth x 3 days           Review of Systems  Constitutional: Negative for fever, chills and unexpected weight change.  HENT: Negative for ear pain, nosebleeds, congestion, sore throat, rhinorrhea, sneezing, trouble  swallowing, dental problem, voice change, postnasal drip and sinus pressure.   Eyes: Negative for visual disturbance.  Respiratory: Positive for hemoptysis, shortness of breath and wheezing. Negative for cough and choking.   Cardiovascular: Positive for chest pain and leg swelling.  Gastrointestinal: Negative for heartburn, vomiting, abdominal pain and diarrhea.  Genitourinary: Negative for difficulty urinating.  Musculoskeletal: Positive for arthralgias. Negative for myalgias.  Skin: Negative for rash.  Neurological: Negative for tremors, syncope and headaches.  Hematological: Positive for environmental allergies. Does not bruise/bleed easily.       Objective:   Physical Exam Filed Vitals:   05/02/12 1457  BP: 118/64  Pulse: 65  Temp: 98 F (36.7 C)  TempSrc: Oral  Height: 5\' 4"  (1.626 m)  Weight: 242 lb 9.6 oz (110.043 kg)  SpO2: 97%    Gen: Pleasant, well-nourished, in no distress,  normal affect  ENT: No lesions,  mouth clear,  oropharynx clear, ++postnasal drip  Neck: No JVD, no TMG, no carotid bruits  Lungs: No use of accessory muscles, no dullness to percussion, mild upper airway pseudo-wheeze  Cardiovascular: RRR, heart sounds normal, no murmur or gallops, no peripheral edema  Abdomen: soft and NT, no HSM,  BS normal  Musculoskeletal: No deformities, no cyanosis or clubbing  Neuro: alert, non focal  Skin: Warm, no lesions or rashes  No results found.        Assessment & Plan:   Cough Cyclical cough likely on the basis of reactive airways disease, reflux, and chronic allergic rhinitis with postnasal drip syndrome Plan Start Symbicort two puff twice daily Start prednisone 10mg  Take 4 for three days 3 for three days 2 for three days 1 for three days and stop Use cough protocol with Delsym/ tessalon  Follow reflux diet Return 2 months      Updated Medication List Outpatient Encounter Prescriptions as of 05/02/2012  Medication Sig Dispense Refill    . albuterol (PROVENTIL) (5 MG/ML) 0.5% nebulizer solution Take 2.5 mg by nebulization every 6 (six) hours as needed.      Marland Kitchen albuterol (VENTOLIN HFA) 108 (90 BASE) MCG/ACT inhaler Inhale 2 puffs into the lungs every 6 (six) hours as needed.      Marland Kitchen amLODipine (NORVASC) 5 MG tablet Take 5 mg by mouth daily.        Marland Kitchen ampicillin (PRINCIPEN) 500 MG capsule Take 500  mg by mouth 4 (four) times daily. Only prior to dental work      . aspirin 81 MG tablet Take 81 mg by mouth daily.      Marland Kitchen atorvastatin (LIPITOR) 40 MG tablet Take 40 mg by mouth daily.        . carvedilol (COREG) 6.25 MG tablet Take 6.25 mg by mouth 2 (two) times daily with a meal.      . chlorthalidone (HYGROTON) 25 MG tablet Take 25 mg by mouth daily.        Marland Kitchen dexlansoprazole (DEXILANT) 60 MG capsule Take 60 mg by mouth daily.        Marland Kitchen dicyclomine (BENTYL) 10 MG capsule Take 1 capsule (10 mg total) by mouth as needed. Take one capsule by mouth prior to meals as needed  90 capsule  0  . exenatide (BYETTA 10 MCG PEN) 10 MCG/0.04ML SOLN Inject into the skin 2 (two) times daily with a meal.        . HYDROcodone-acetaminophen (VICODIN) 5-500 MG per tablet Take 1 tablet by mouth every 4 (four) hours as needed.       . Insulin Aspart (NOVOLOG FLEXPEN [place]) Use as directed      . Insulin Glargine (LANTUS SOLOSTAR [place]) Use as directed      . LORazepam (ATIVAN) 1 MG tablet Take 1 mg by mouth 2 (two) times daily as needed.       Marland Kitchen losartan (COZAAR) 100 MG tablet Take 100 mg by mouth daily.        . temazepam (RESTORIL) 15 MG capsule Take 15 mg by mouth at bedtime as needed. Take 1-2 capsules at bedtime as needed       . benzonatate (TESSALON) 100 MG capsule Use 1-2 every 4 hours as needed per cough protocol  90 capsule  4  . budesonide-formoterol (SYMBICORT) 160-4.5 MCG/ACT inhaler Inhale 2 puffs into the lungs 2 (two) times daily.  1 Inhaler  12  . dextromethorphan (DELSYM) 30 MG/5ML liquid Use as directed per cough protocol  89 mL  0  . predniSONE  (DELTASONE) 10 MG tablet Take 4 for three days 3 for three days 2 for three days 1 for three days and stop  30 tablet  0  . DISCONTD: carvedilol (COREG) 3.125 MG tablet Take 3.125 mg by mouth 2 (two) times daily with a meal.        . DISCONTD: esomeprazole (NEXIUM) 40 MG capsule Take 40 mg by mouth daily before breakfast.        . DISCONTD: insulin NPH-insulin regular (HUMULIN 70/30) (70-30) 100 UNIT/ML injection Inject 38 Units into the skin 2 (two) times daily with a meal. 35 units 2 times a day      . DISCONTD: omeprazole (PRILOSEC) 20 MG capsule Take 1 capsule (20 mg total) by mouth daily.  30 capsule  11  . DISCONTD: predniSONE (DELTASONE) 10 MG tablet Take 10 mg by mouth. Take 1/2 tablet by mouth x 3 days

## 2012-05-02 NOTE — Assessment & Plan Note (Signed)
Cyclical cough likely on the basis of reactive airways disease, reflux, and chronic allergic rhinitis with postnasal drip syndrome Plan Start Symbicort two puff twice daily Start prednisone 10mg  Take 4 for three days 3 for three days 2 for three days 1 for three days and stop Use cough protocol with Delsym/ tessalon  Follow reflux diet Return 2 months

## 2012-05-06 ENCOUNTER — Ambulatory Visit (INDEPENDENT_AMBULATORY_CARE_PROVIDER_SITE_OTHER): Payer: BC Managed Care – PPO | Admitting: Internal Medicine

## 2012-05-06 ENCOUNTER — Encounter: Payer: Self-pay | Admitting: Internal Medicine

## 2012-05-06 VITALS — BP 127/71 | HR 87 | Temp 98.4°F | Ht 63.0 in | Wt 242.8 lb

## 2012-05-06 DIAGNOSIS — K219 Gastro-esophageal reflux disease without esophagitis: Secondary | ICD-10-CM

## 2012-05-06 NOTE — Patient Instructions (Signed)
Try and Loose 10 pounds in the next 3 months  If you cannot loose any weight, I will talk to Dr.Fagan about a bariatric surgery.  Continue Dexilant daily.  GERD information sheet  Join [employer]  Office visit 3 months

## 2012-05-06 NOTE — Assessment & Plan Note (Signed)
Very pleasant morbidly obese 66 year old lady with GERD and possible associated cough/bronchitis. Typical reflux symptoms well controlled. She certainly could have a laryngopharyngeal component. We discussed the multipronged approach to GERD.  She's been unable to lose weight. She has multiple obesity-related disorders.  Recommendations: Goal of a 10 pound weight loss over the next 3 months. I've recommended re joining [employer]. Water aerobics at the [facility] 3 times weekly. Continue Dexilant 60 mg orally daily. No late night meals, elevating the head of the bed, etc. Reviewed.  Antireflux surgery is not a good option in this lady. If anything, bariatric surgery may be something that needs to be considered further in the future. However, I spent a good 20 minutes with this lady today emphasizing that significant weight loss on her own  May  very well mitigates several of her medical problems.  We'll see her back in the office in 3 months and go from there.

## 2012-05-06 NOTE — Progress Notes (Signed)
Primary Care Physician:  Carylon Perches, MD, MD Primary Gastroenterologist:  Dr.   Pre-Procedure History & Physical: HPI:  Ann Fuller is a 66 y.o. female here for followup GERD. Dexilant 60 mg orally daily his current regimen. Patient states this is working as good as anything for reflux. Saw Dr. Shan Levans for recurrent bronchitis. Apparently,  there is felt to be a significant reflux component.Marland Kitchen Unfortunately, this lady's gained another 3 pounds since her last visit here. BMI now 43. She has a multitude of obesity-related health issues.  Last EGD demonstrated only a small hiatal hernia.  History of colonic adenoma. It should be due for a surveillance colonoscopy in 2016.  Past Medical History  Diagnosis Date  . HTN (hypertension)   . Diabetes mellitus   . Adenomatous colon polyp   . Diverticulitis   . Breast cancer 1990  . Internal hemorrhoid   . IBS (irritable bowel syndrome)   . GERD (gastroesophageal reflux disease)   . Dyspepsia   . Hiatal hernia   . Tubular adenoma 11/2010    Past Surgical History  Procedure Date  . Lower back 2007  . Cholecystectomy   . Tubal ligation   . Left mastectomy   . Esophagogastroduodenoscopy 12/13/2010    Dr. Dot Been hernia, fundal gland type polyps  . Colonoscopy 12/13/2010    Dr. Houston Siren pancolonic diverticular.tubular adenoma  . Colonoscopy 07/13/2003  . Appendectomy     Prior to Admission medications   Medication Sig Start Date End Date Taking? Authorizing Provider  albuterol (PROVENTIL) (5 MG/ML) 0.5% nebulizer solution Take 2.5 mg by nebulization every 6 (six) hours as needed.   Yes Historical Provider, MD  albuterol (VENTOLIN HFA) 108 (90 BASE) MCG/ACT inhaler Inhale 2 puffs into the lungs every 6 (six) hours as needed.   Yes Historical Provider, MD  amLODipine (NORVASC) 5 MG tablet Take 5 mg by mouth daily.     Yes Historical Provider, MD  ampicillin (PRINCIPEN) 500 MG capsule Take 500 mg by mouth 4 (four) times  daily. Only prior to dental work   Yes Historical Provider, MD  aspirin 81 MG tablet Take 81 mg by mouth daily.   Yes Historical Provider, MD  atorvastatin (LIPITOR) 40 MG tablet Take 40 mg by mouth daily.     Yes Historical Provider, MD  benzonatate (TESSALON) 100 MG capsule Use 1-2 every 4 hours as needed per cough protocol 05/02/12  Yes Storm Frisk, MD  budesonide-formoterol [facility]) 160-4.5 MCG/ACT inhaler Inhale 2 puffs into the lungs 2 (two) times daily. 05/02/12 05/02/13 Yes Storm Frisk, MD  carvedilol (COREG) 6.25 MG tablet Take 6.25 mg by mouth 2 (two) times daily with a meal.   Yes Historical Provider, MD  chlorthalidone (HYGROTON) 25 MG tablet Take 25 mg by mouth daily.     Yes Historical Provider, MD  dexlansoprazole (DEXILANT) 60 MG capsule Take 60 mg by mouth daily.     Yes Historical Provider, MD  dextromethorphan (DELSYM) 30 MG/5ML liquid Use as directed per cough protocol 05/02/12  Yes Storm Frisk, MD  dicyclomine (BENTYL) 10 MG capsule Take 1 capsule (10 mg total) by mouth as needed. Take one capsule by mouth prior to meals as needed 04/07/12  Yes Joselyn Arrow, NP  exenatide (BYETTA 10 MCG PEN) 10 MCG/0.04ML SOLN Inject into the skin 2 (two) times daily with a meal.     Yes Historical Provider, MD  HYDROcodone-acetaminophen (VICODIN) 5-500 MG per tablet Take 1 tablet by mouth every 4 (  four) hours as needed.    Yes Historical Provider, MD  Insulin Aspart (NOVOLOG FLEXPEN [place]) Use as directed   Yes Historical Provider, MD  Insulin Glargine (LANTUS SOLOSTAR [place]) Use as directed   Yes Historical Provider, MD  LORazepam (ATIVAN) 1 MG tablet Take 1 mg by mouth 2 (two) times daily as needed.    Yes Historical Provider, MD  losartan (COZAAR) 100 MG tablet Take 100 mg by mouth daily.     Yes Historical Provider, MD  predniSONE (DELTASONE) 10 MG tablet Take 4 for three days 3 for three days 2 for three days 1 for three days and stop 05/02/12  Yes Storm Frisk, MD  temazepam  (RESTORIL) 15 MG capsule Take 15 mg by mouth at bedtime as needed. Take 1-2 capsules at bedtime as needed    Yes Historical Provider, MD    Allergies as of 05/06/2012 - Review Complete 05/06/2012  Allergen Reaction Noted  . Ceftin (cefuroxime axetil)  05/02/2012  . Codeine Swelling   . Nexium (esomeprazole magnesium)  05/02/2012  . Omnicef (cefdinir)  05/02/2012  . Sulfa antibiotics  05/02/2012    Family History  Problem Relation Age of Onset  . Colon cancer Mother     History   Social History  . Marital Status: Married    Spouse Name: N/A    Number of Children: N/A  . Years of Education: N/A   Occupational History  . Not on file.   Social History Main Topics  . Smoking status: Never Smoker   . Smokeless tobacco: Never Used  . Alcohol Use: No  . Drug Use: No  . Sexually Active: Not on file   Other Topics Concern  . Not on file   Social History Narrative  . No narrative on file    Review of Systems: See HPI, otherwise negative ROS  Physical Exam: BP 127/71  Pulse 87  Temp(Src) 98.4 F (36.9 C) (Temporal)  Ht 5\' 3"  (1.6 m)  Wt 242 lb 12.8 oz (110.133 kg)  BMI 43.01 kg/m2 General:   Alert, significantly obese. Well-developed, well-nourished, pleasant and cooperative in NAD Skin:  Intact without significant lesions or rashes. Eyes:  Sclera clear, no icterus.   Conjunctiva pink. Ears:  Normal auditory acuity. Nose:  No deformity, discharge,  or lesions. Mouth:  No deformity or lesions. Neck:  Supple; no masses or thyromegaly. No significant cervical adenopathy. Lungs:  Clear throughout to auscultation.   No wheezes, crackles, or rhonchi. No acute distress. Heart:  Regular rate and rhythm; no murmurs, clicks, rubs,  or gallops. Abdomen: Massively obese. Non-distended, normal bowel sounds.  Soft and nontender without appreciable mass or hepatosplenomegaly.  Pulses:  Normal pulses noted. Extremities:  Without clubbing or edema.

## 2012-07-04 ENCOUNTER — Ambulatory Visit (INDEPENDENT_AMBULATORY_CARE_PROVIDER_SITE_OTHER): Payer: BC Managed Care – PPO | Admitting: Critical Care Medicine

## 2012-07-04 ENCOUNTER — Encounter: Payer: Self-pay | Admitting: Critical Care Medicine

## 2012-07-04 VITALS — BP 140/64 | HR 98 | Temp 98.4°F | Ht 64.0 in | Wt 239.8 lb

## 2012-07-04 DIAGNOSIS — R05 Cough: Secondary | ICD-10-CM

## 2012-07-04 MED ORDER — BUDESONIDE-FORMOTEROL FUMARATE 160-4.5 MCG/ACT IN AERO: 2.0000 | INHALATION_SPRAY | Freq: Two times a day (BID) | RESPIRATORY_TRACT | Status: DC

## 2012-07-04 MED ORDER — MOMETASONE FUROATE 220 MCG/INH IN AEPB: 2.0000 | INHALATION_SPRAY | Freq: Every day | RESPIRATORY_TRACT | Status: DC

## 2012-07-04 NOTE — Patient Instructions (Addendum)
When current symbicort runs out, stop symbicort. Start asmanex when symbicort stopped, two puff daily  No other medication changes Return 4 months

## 2012-07-04 NOTE — Progress Notes (Signed)
Subjective:    Patient ID: Ann Fuller, female    DOB: November 18, 1946, 66 y.o.   MRN: [id number]  HPI 07/04/2012 Since last ov cough is better.  No real dyspnea.  Not using cough pills.  On symbicort. Spirometry.    Pt denies any significant sore throat, nasal congestion or excess secretions, fever, chills, sweats, unintended weight loss, pleurtic or exertional chest pain, orthopnea PND, or leg swelling Pt denies any increase in rescue therapy over baseline, denies waking up needing it or having any early am or nocturnal exacerbations of coughing/wheezing/or dyspnea. Pt also denies any obvious fluctuation in symptoms with  weather or environmental change or other alleviating or aggravating factors    Past Medical History  Diagnosis Date  . HTN (hypertension)   . Diabetes mellitus   . Adenomatous colon polyp   . Diverticulitis   . Breast cancer 1990  . Internal hemorrhoid   . IBS (irritable bowel syndrome)   . GERD (gastroesophageal reflux disease)   . Dyspepsia   . Hiatal hernia   . Tubular adenoma 11/2010     Family History  Problem Relation Age of Onset  . Colon cancer Mother      History   Social History  . Marital Status: Married    Spouse Name: N/A    Number of Children: N/A  . Years of Education: N/A   Occupational History  . Not on file.   Social History Main Topics  . Smoking status: Never Smoker   . Smokeless tobacco: Never Used  . Alcohol Use: No  . Drug Use: No  . Sexually Active: Not on file   Other Topics Concern  . Not on file   Social History Narrative  . No narrative on file     Allergies  Allergen Reactions  . Ceftin (Cefuroxime Axetil)     swelling  . Codeine Swelling  . Nexium (Esomeprazole Magnesium)     diarrhea  . Omnicef (Cefdinir)     diarrhea  . Sulfa Antibiotics     rash     Outpatient Prescriptions Prior to Visit  Medication Sig Dispense Refill  . albuterol (PROVENTIL) (5 MG/ML) 0.5% nebulizer solution Take 2.5 mg  by nebulization every 6 (six) hours as needed.      Marland Kitchen albuterol (VENTOLIN HFA) 108 (90 BASE) MCG/ACT inhaler Inhale 2 puffs into the lungs every 6 (six) hours as needed.      Marland Kitchen amLODipine (NORVASC) 5 MG tablet Take 5 mg by mouth daily.        Marland Kitchen aspirin 81 MG tablet Take 81 mg by mouth daily.      Marland Kitchen atorvastatin (LIPITOR) 40 MG tablet Take 40 mg by mouth daily.        . benzonatate (TESSALON) 100 MG capsule Use 1-2 every 4 hours as needed per cough protocol  90 capsule  4  . carvedilol (COREG) 6.25 MG tablet Take 6.25 mg by mouth 2 (two) times daily with a meal.      . chlorthalidone (HYGROTON) 25 MG tablet Take 25 mg by mouth daily.        Marland Kitchen dexlansoprazole (DEXILANT) 60 MG capsule Take 60 mg by mouth daily.       Marland Kitchen dextromethorphan (DELSYM) 30 MG/5ML liquid Use as directed per cough protocol  89 mL  0  . dicyclomine (BENTYL) 10 MG capsule Take 1 capsule (10 mg total) by mouth as needed. Take one capsule by mouth prior to meals as needed  90  capsule  0  . exenatide (BYETTA 10 MCG PEN) 10 MCG/0.04ML SOLN Inject into the skin 2 (two) times daily with a meal.        . HYDROcodone-acetaminophen (VICODIN) 5-500 MG per tablet Take 1 tablet by mouth every 4 (four) hours as needed.       . Insulin Aspart (NOVOLOG FLEXPEN [place]) Use as directed      . Insulin Glargine (LANTUS SOLOSTAR [place]) Use as directed      . LORazepam (ATIVAN) 1 MG tablet Take 1 mg by mouth 2 (two) times daily as needed.       Marland Kitchen losartan (COZAAR) 100 MG tablet Take 100 mg by mouth daily.        . temazepam (RESTORIL) 15 MG capsule Take 15 mg by mouth at bedtime as needed. Take 1-2 capsules at bedtime as needed       . budesonide-formoterol (SYMBICORT) 160-4.5 MCG/ACT inhaler Inhale 2 puffs into the lungs 2 (two) times daily.  1 Inhaler  12  . ampicillin (PRINCIPEN) 500 MG capsule Take 500 mg by mouth 4 (four) times daily. Only prior to dental work      . predniSONE (DELTASONE) 10 MG tablet Take 4 for three days 3 for three days 2 for  three days 1 for three days and stop  30 tablet  0      Review of Systems  Constitutional: Negative for unexpected weight change.  HENT: Negative for nosebleeds, congestion, sneezing, trouble swallowing, dental problem, voice change and sinus pressure.   Eyes: Negative for visual disturbance.  Respiratory: Negative for choking.   Cardiovascular: Positive for leg swelling.  Gastrointestinal: Negative for vomiting, abdominal pain and diarrhea.  Genitourinary: Negative for difficulty urinating.  Musculoskeletal: Positive for arthralgias.  Neurological: Negative for tremors and syncope.  Hematological: Does not bruise/bleed easily.       Objective:   Physical Exam  Filed Vitals:   07/04/12 1456  BP: 140/64  Pulse: 98  Temp: 98.4 F (36.9 C)  TempSrc: Oral  Height: 5\' 4"  (1.626 m)  Weight: 239 lb 12.8 oz (108.773 kg)  SpO2: 95%    Gen: Pleasant, well-nourished, in no distress,  normal affect  ENT: No lesions,  mouth clear,  oropharynx clear, ++postnasal drip  Neck: No JVD, no TMG, no carotid bruits  Lungs: No use of accessory muscles, no dullness to percussion, mild upper airway pseudo-wheeze  Cardiovascular: RRR, heart sounds normal, no murmur or gallops, no peripheral edema  Abdomen: soft and NT, no HSM,  BS normal  Musculoskeletal: No deformities, no cyanosis or clubbing  Neuro: alert, non focal  Skin: Warm, no lesions or rashes  No results found.        Assessment & Plan:   Cough Mild intermittent cough variant asthma with GERD, atopy as triggers Plan When current symbicort runs out, stop symbicort. Start asmanex when symbicort stopped, two puff daily  No other medication changes Return 4 months     Updated Medication List Outpatient Encounter Prescriptions as of 07/04/2012  Medication Sig Dispense Refill  . albuterol (PROVENTIL) (5 MG/ML) 0.5% nebulizer solution Take 2.5 mg by nebulization every 6 (six) hours as needed.      Marland Kitchen albuterol  (VENTOLIN HFA) 108 (90 BASE) MCG/ACT inhaler Inhale 2 puffs into the lungs every 6 (six) hours as needed.      Marland Kitchen amLODipine (NORVASC) 5 MG tablet Take 5 mg by mouth daily.        Marland Kitchen aspirin 81 MG tablet  Take 81 mg by mouth daily.      Marland Kitchen atorvastatin (LIPITOR) 40 MG tablet Take 40 mg by mouth daily.        . benzonatate (TESSALON) 100 MG capsule Use 1-2 every 4 hours as needed per cough protocol  90 capsule  4  . budesonide-formoterol (SYMBICORT) 160-4.5 MCG/ACT inhaler Inhale 2 puffs into the lungs 2 (two) times daily. When current inhaler completed, stop symbicort  1 Inhaler  12  . carvedilol (COREG) 6.25 MG tablet Take 6.25 mg by mouth 2 (two) times daily with a meal.      . chlorthalidone (HYGROTON) 25 MG tablet Take 25 mg by mouth daily.        Marland Kitchen dexlansoprazole (DEXILANT) 60 MG capsule Take 60 mg by mouth daily.       Marland Kitchen dextromethorphan (DELSYM) 30 MG/5ML liquid Use as directed per cough protocol  89 mL  0  . dicyclomine (BENTYL) 10 MG capsule Take 1 capsule (10 mg total) by mouth as needed. Take one capsule by mouth prior to meals as needed  90 capsule  0  . exenatide (BYETTA 10 MCG PEN) 10 MCG/0.04ML SOLN Inject into the skin 2 (two) times daily with a meal.        . HYDROcodone-acetaminophen (VICODIN) 5-500 MG per tablet Take 1 tablet by mouth every 4 (four) hours as needed.       . Insulin Aspart (NOVOLOG FLEXPEN [place]) Use as directed      . Insulin Glargine (LANTUS SOLOSTAR [place]) Use as directed      . LORazepam (ATIVAN) 1 MG tablet Take 1 mg by mouth 2 (two) times daily as needed.       Marland Kitchen losartan (COZAAR) 100 MG tablet Take 100 mg by mouth daily.        . temazepam (RESTORIL) 15 MG capsule Take 15 mg by mouth at bedtime as needed. Take 1-2 capsules at bedtime as needed       . DISCONTD: budesonide-formoterol (SYMBICORT) 160-4.5 MCG/ACT inhaler Inhale 2 puffs into the lungs 2 (two) times daily.  1 Inhaler  12  . ampicillin (PRINCIPEN) 500 MG capsule Take 500 mg by mouth 4 (four) times daily.  Only prior to dental work      . mometasone (ASMANEX 120 METERED DOSES) 220 MCG/INH inhaler Inhale 2 puffs into the lungs daily.  1 Inhaler  12  . DISCONTD: predniSONE (DELTASONE) 10 MG tablet Take 4 for three days 3 for three days 2 for three days 1 for three days and stop  30 tablet  0

## 2012-07-06 NOTE — Assessment & Plan Note (Signed)
Mild intermittent cough variant asthma with GERD, atopy as triggers Plan When current symbicort runs out, stop symbicort. Start asmanex when symbicort stopped, two puff daily  No other medication changes Return 4 months

## 2012-07-22 ENCOUNTER — Encounter (HOSPITAL_COMMUNITY): Payer: BC Managed Care – PPO | Attending: Oncology | Admitting: Oncology

## 2012-07-22 ENCOUNTER — Encounter (HOSPITAL_COMMUNITY): Payer: Self-pay | Admitting: Oncology

## 2012-07-22 VITALS — BP 126/78 | HR 83 | Temp 98.3°F | Wt 234.7 lb

## 2012-07-22 DIAGNOSIS — C50919 Malignant neoplasm of unspecified site of unspecified female breast: Secondary | ICD-10-CM

## 2012-07-22 DIAGNOSIS — Z171 Estrogen receptor negative status [ER-]: Secondary | ICD-10-CM

## 2012-07-22 DIAGNOSIS — C50419 Malignant neoplasm of upper-outer quadrant of unspecified female breast: Secondary | ICD-10-CM

## 2012-07-22 NOTE — Progress Notes (Signed)
Problem #1 poorly differentiated left rest cancer presenting in June 1990 had a lumpectomy followed by adjuvant chemotherapy and radiation therapy for stage II disease with negative nodes but a poorly differentiated ER/PR negative cancer high S-phase fraction followed by left-sided recurrence in 1995 at which time she had a mastectomy followed by CMF chemotherapy postoperatively and 5 years of tamoxifen prophylactically for the right breast but in retrospect that was probably not helpful since she was ER/PR negative. She was found to have LV I. the time of her recurrence which was 2.4 cm in size but never had metastatic disease and remain remains disease free presently. Problem #2 obesity problem #3 diabetes mellitus problem #4 degenerative disc disease status post surgery by Dr. Franky Macho in November 2007 with good results so she occasionally still has a twinge of discomfort in her back and leg.  Ann Fuller is doing well but she is very concerned about her sister who was recently diagnosed with small cell lung cancer. Other than that Ann Fuller has a negative review of systems except for the twinge in her back which is not new or different. She is seeing an orthopedic surgeon and may need to see her neurosurgeon again.  Vital signs are stable and are in the chart. She remains to heavy for her height she's 234 pounds a 5 foot 4 inch frame. She has no lymphadenopathy. The left reconstructed breast is without change. The right breast is negative for any masses. Lungs are clear. Heart shows a regular rhythm and rate without murmur rub or gallop. Abdomen is obese nontender without organomegaly. She has no peripheral edema of the legs. The left arm is ever so slightly puffy.  She is also seeing Dr. Delford Field for asthma and we'll continue to see him. We will see her in a year. She has a regular blood work by Dr. Ouida Sills and I do not think we will do any here today

## 2012-07-22 NOTE — Patient Instructions (Addendum)
Ann Fuller  [id number] [birth year] Dr. Glenford Peers   [facility]  Discharge Instructions  RECOMMENDATIONS MADE BY THE CONSULTANT AND ANY TEST RESULTS WILL BE SENT TO YOUR REFERRING DOCTOR.   EXAM FINDINGS BY MD TODAY AND SIGNS AND SYMPTOMS TO REPORT TO CLINIC OR PRIMARY MD: no evidence of recurrence by exam.  MEDICATIONS PRESCRIBED: none   INSTRUCTIONS GIVEN AND DISCUSSED: Other :  Report any new lumps, unusual bone pain or shortness of breath.  SPECIAL INSTRUCTIONS/FOLLOW-UP: Return to Clinic to see Dr. Mariel Sleet in 1 year.   I acknowledge that I have been informed and understand all the instructions given to me and received a copy. I do not have any more questions at this time, but understand that I may call the [facility]ty [facility] at [facility] at [phone] during business hours should I have any further questions or need assistance in obtaining follow-up care.    __________________________________________  _____________  __________ Signature of Patient or Authorized Representative            Date                   Time    __________________________________________ Nurse's Signature

## 2012-07-23 ENCOUNTER — Other Ambulatory Visit: Payer: Self-pay

## 2012-07-23 ENCOUNTER — Encounter: Payer: Self-pay | Admitting: Internal Medicine

## 2012-07-23 MED ORDER — DICYCLOMINE HCL 10 MG PO CAPS: ORAL_CAPSULE | ORAL | Status: DC

## 2012-07-23 NOTE — Telephone Encounter (Signed)
Open in error

## 2012-09-08 ENCOUNTER — Encounter: Payer: Self-pay | Admitting: Adult Health

## 2012-09-08 ENCOUNTER — Ambulatory Visit (INDEPENDENT_AMBULATORY_CARE_PROVIDER_SITE_OTHER): Payer: BC Managed Care – PPO | Admitting: Adult Health

## 2012-09-08 VITALS — BP 132/66 | HR 86 | Temp 97.2°F | Ht 64.0 in | Wt 238.6 lb

## 2012-09-08 DIAGNOSIS — J069 Acute upper respiratory infection, unspecified: Secondary | ICD-10-CM

## 2012-09-08 MED ORDER — AZITHROMYCIN 250 MG PO TABS: ORAL_TABLET | ORAL | Status: AC

## 2012-09-08 MED ORDER — HYDROCODONE-HOMATROPINE 5-1.5 MG/5ML PO SYRP: 5.0000 mL | ORAL_SOLUTION | Freq: Four times a day (QID) | ORAL | Status: AC | PRN

## 2012-09-08 NOTE — Patient Instructions (Addendum)
Mucinex DM Twice daily  As needed  Cough/congesiton  Saline nasal rinses As needed   Allegra 180mg  daily As needed  Drainage  Fluids and rest  Tylenol As needed   Hydromet 1-2 tsp every 4hr As needed  Cough-may make you sleepy  Please contact office for sooner follow up if symptoms do not improve or worsen or seek emergency care  Zpack to have on hold if not improving or worsens with discolored mucus.

## 2012-09-08 NOTE — Progress Notes (Signed)
  Subjective:    Patient ID: Ann Fuller, female    DOB: 02/06/1946, 66 y.o.   MRN: [id number]  HPI 66 yo female with known hx of cyclical cough and RAD   09/08/2012 Acute OV  Complains of prod cough with yellow mucus, head congestion w/ PND, sore throat, wheezing, tightness in chest, chills x3 days. Has a lot of drainage mainly clear. Cough has clear mucus w/ tinge of yellow at times. Taking otc without much help.  No fever or chest pain  No recent abx or travel .     Review of Systems Constitutional:   No  weight loss, night sweats,  Fevers, chills, fatigue, or  lassitude.  HEENT:   No headaches,  Difficulty swallowing,  Tooth/dental problems, or  Sore throat,                No sneezing, itching,  +ear ache, nasal congestion, post nasal drip,   CV:  No chest pain,  Orthopnea, PND, swelling in lower extremities, anasarca, dizziness, palpitations, syncope.   GI  No heartburn, indigestion, abdominal pain, nausea, vomiting, diarrhea, change in bowel habits, loss of appetite, bloody stools.   Resp:  No coughing up of blood.    No chest wall deformity  Skin: no rash or lesions.  GU: no dysuria, change in color of urine, no urgency or frequency.  No flank pain, no hematuria   MS:  No joint pain or swelling.  No decreased range of motion.  No back pain.  Psych:  No change in mood or affect. No depression or anxiety.  No memory loss.         Objective:   Physical Exam  GEN: A/Ox3; pleasant , NAD, well nourished   HEENT:  [place]/AT,  EACs-clear, TMs-wnl, NOSE-clear drainage , THROAT-clear, no lesions, no postnasal drip or exudate noted.   NECK:  Supple w/ fair ROM; no JVD; normal carotid impulses w/o bruits; no thyromegaly or nodules palpated; no lymphadenopathy.  RESP  Coarse BS  w/o, wheezes/ rales/ or rhonchi.no accessory muscle use, no dullness to percussion  CARD:  RRR, no m/r/g  , no peripheral edema, pulses intact, no cyanosis or clubbing.  GI:   Soft & nt; nml  bowel sounds; no organomegaly or masses detected.  Musco: Warm bil, no deformities or joint swelling noted.   Neuro: alert, no focal deficits noted.    Skin: Warm, no lesions or rashes        Assessment & Plan:

## 2012-09-09 DIAGNOSIS — J069 Acute upper respiratory infection, unspecified: Secondary | ICD-10-CM | POA: Insufficient documentation

## 2012-09-09 NOTE — Assessment & Plan Note (Signed)
[place]   Plan Mucinex DM Twice daily  As needed  Cough/congesiton  Saline nasal rinses As needed   Allegra 180mg  daily As needed  Drainage  Fluids and rest  Tylenol As needed   Hydromet 1-2 tsp every 4hr As needed  Cough-may make you sleepy  Please contact office for sooner follow up if symptoms do not improve or worsen or seek emergency care  Zpack to have on hold if not improving or worsens with discolored mucus.

## 2012-09-18 ENCOUNTER — Other Ambulatory Visit (HOSPITAL_COMMUNITY): Payer: Self-pay | Admitting: Oncology

## 2012-09-18 DIAGNOSIS — Z9012 Acquired absence of left breast and nipple: Secondary | ICD-10-CM

## 2012-09-18 DIAGNOSIS — Z853 Personal history of malignant neoplasm of breast: Secondary | ICD-10-CM

## 2012-09-18 DIAGNOSIS — Z1231 Encounter for screening mammogram for malignant neoplasm of breast: Secondary | ICD-10-CM

## 2012-10-06 ENCOUNTER — Ambulatory Visit
Admission: RE | Admit: 2012-10-06 | Discharge: 2012-10-06 | Disposition: A | Discharge status: Home / Self Care | Payer: BC Managed Care – PPO | Source: Ambulatory Visit | Attending: Oncology | Admitting: Oncology

## 2012-10-06 DIAGNOSIS — Z853 Personal history of malignant neoplasm of breast: Secondary | ICD-10-CM

## 2012-10-06 DIAGNOSIS — Z9012 Acquired absence of left breast and nipple: Secondary | ICD-10-CM

## 2012-10-06 DIAGNOSIS — Z1231 Encounter for screening mammogram for malignant neoplasm of breast: Secondary | ICD-10-CM

## 2012-11-12 ENCOUNTER — Encounter: Payer: Self-pay | Admitting: Critical Care Medicine

## 2012-11-12 ENCOUNTER — Ambulatory Visit (INDEPENDENT_AMBULATORY_CARE_PROVIDER_SITE_OTHER): Payer: BC Managed Care – PPO | Admitting: Critical Care Medicine

## 2012-11-12 VITALS — BP 138/68 | HR 78 | Temp 97.4°F | Ht 64.0 in | Wt 240.8 lb

## 2012-11-12 DIAGNOSIS — J45909 Unspecified asthma, uncomplicated: Secondary | ICD-10-CM

## 2012-11-12 MED ORDER — MOMETASONE FUROATE 220 MCG/INH IN AEPB: 1.0000 | INHALATION_SPRAY | Freq: Every day | RESPIRATORY_TRACT | Status: DC

## 2012-11-12 NOTE — Patient Instructions (Addendum)
Reduce asmanex to one puff daily Return 6 months

## 2012-11-12 NOTE — Progress Notes (Signed)
Subjective:    Patient ID: Ann Fuller, female    DOB: 06/20/1946, 66 y.o.   MRN: [id number]  HPI  66 yo female with known hx of cyclical cough and RAD   .   11/12/2012 The patient is much improved with no further coughing. There is no shortness of breath. The patient denies any acute asthma symptoms. The patient maintains Asmanex daily. PUL ASTHMA HISTORY 11/12/2012  Symptoms 0-2 days/week  Nighttime awakenings 0-2/month  Interference with activity No limitations  SABA use 0-2 days/wk  Exacerbations requiring oral steroids 0-1 / year     Review of Systems  Constitutional:   No  weight loss, night sweats,  Fevers, chills, fatigue, or  lassitude.  HEENT:   No headaches,  Difficulty swallowing,  Tooth/dental problems, or  Sore throat,                No sneezing, itching,  n0 ear ache, nasal congestion, post nasal drip,   CV:  No chest pain,  Orthopnea, PND, swelling in lower extremities, anasarca, dizziness, palpitations, syncope.   GI  No heartburn, indigestion, abdominal pain, nausea, vomiting, diarrhea, change in bowel habits, loss of appetite, bloody stools.   Resp:  No coughing up of blood.    No chest wall deformity  Skin: no rash or lesions.  GU: no dysuria, change in color of urine, no urgency or frequency.  No flank pain, no hematuria   MS:  No joint pain or swelling.  No decreased range of motion.  No back pain.  Psych:  No change in mood or affect. No depression or anxiety.  No memory loss.         Objective:   Physical Exam BP 138/68  Pulse 78  Temp 97.4 F (36.3 C) (Oral)  Ht 5\' 4"  (1.626 m)  Wt 240 lb 12.8 oz (109.226 kg)  BMI 41.33 kg/m2  SpO2 98%   GEN: A/Ox3; pleasant , NAD, well nourished   HEENT:  [place]/AT,  EACs-clear, TMs-wnl, NOSE-clear drainage , THROAT-clear, no lesions, no postnasal drip or exudate noted.   NECK:  Supple w/ fair ROM; no JVD; normal carotid impulses w/o bruits; no thyromegaly or nodules palpated; no  lymphadenopathy.  RESP  clear without wheezing  CARD:  RRR, no m/r/g  , no peripheral edema, pulses intact, no cyanosis or clubbing.  GI:   Soft & nt; nml bowel sounds; no organomegaly or masses detected.  Musco: Warm bil, no deformities or joint swelling noted.   Neuro: alert, no focal deficits noted.    Skin: Warm, no lesions or rashes        Assessment & Plan:   Moderate persistent asthma do to atopic features Cyclical cough on the basis of reactive airways disease and moderate persistent asthma with atopic features and associated reflux Plan Maintained reflux program and reflux diet Reduce Asmanex 1 puff daily   Updated Medication List Outpatient Encounter Prescriptions as of 11/12/2012  Medication Sig Dispense Refill  . albuterol (PROVENTIL) (5 MG/ML) 0.5% nebulizer solution Take 2.5 mg by nebulization every 6 (six) hours as needed.      Marland Kitchen albuterol (VENTOLIN HFA) 108 (90 BASE) MCG/ACT inhaler Inhale 2 puffs into the lungs every 6 (six) hours as needed.      Marland Kitchen amLODipine (NORVASC) 5 MG tablet Take 5 mg by mouth daily.        Marland Kitchen ampicillin (PRINCIPEN) 500 MG capsule Take 500 mg by mouth 4 (four) times daily. Only prior to dental  work      . aspirin 81 MG tablet Take 81 mg by mouth daily.      Marland Kitchen atorvastatin (LIPITOR) 40 MG tablet Take 40 mg by mouth daily.        . benzonatate (TESSALON) 100 MG capsule Use 1-2 every 4 hours as needed per cough protocol  90 capsule  4  . carvedilol (COREG) 6.25 MG tablet Take 6.25 mg by mouth 2 (two) times daily with a meal.      . chlorthalidone (HYGROTON) 25 MG tablet Take 25 mg by mouth daily.        Marland Kitchen dexlansoprazole (DEXILANT) 60 MG capsule Take 60 mg by mouth daily.       Marland Kitchen dextromethorphan (DELSYM) 30 MG/5ML liquid Use as directed per cough protocol  89 mL  0  . dicyclomine (BENTYL) 10 MG capsule Take one capsule by mouth prior to meals as needed (no more than four times daily.)  120 capsule  5  . Docusate Calcium (STOOL SOFTENER  PO) Take by mouth as needed.      Marland Kitchen exenatide (BYETTA 10 MCG PEN) 10 MCG/0.04ML SOLN Inject into the skin 2 (two) times daily with a meal.        . HYDROcodone-acetaminophen (VICODIN) 5-500 MG per tablet Take 1 tablet by mouth every 4 (four) hours as needed.       . Insulin Aspart (NOVOLOG FLEXPEN [place]) 10 units twice daily before meals      . Insulin Glargine (LANTUS SOLOSTAR [place]) 55 units every morning before breakfast      . LORazepam (ATIVAN) 1 MG tablet Take 1 mg by mouth 2 (two) times daily as needed.       Marland Kitchen losartan (COZAAR) 100 MG tablet Take 100 mg by mouth daily.        . mometasone (ASMANEX 120 METERED DOSES) 220 MCG/INH inhaler Inhale 1 puff into the lungs daily.  1 Inhaler  12  . temazepam (RESTORIL) 15 MG capsule Take 15 mg by mouth at bedtime as needed. Take 1-2 capsules at bedtime as needed       . [DISCONTINUED] mometasone (ASMANEX 120 METERED DOSES) 220 MCG/INH inhaler Inhale 2 puffs into the lungs daily.  1 Inhaler  12  . [DISCONTINUED] PredniSONE 10 MG KIT Take 10 mg by mouth. taper

## 2012-11-13 NOTE — Assessment & Plan Note (Signed)
Cyclical cough on the basis of reactive airways disease and moderate persistent asthma with atopic features and associated reflux Plan Maintained reflux program and reflux diet Reduce Asmanex 1 puff daily

## 2012-11-26 ENCOUNTER — Emergency Department (HOSPITAL_COMMUNITY)
Admission: EM | Admit: 2012-11-26 | Discharge: 2012-11-26 | Disposition: A | Discharge status: Home / Self Care | Payer: BC Managed Care – PPO | Attending: Emergency Medicine | Admitting: Emergency Medicine

## 2012-11-26 ENCOUNTER — Encounter (HOSPITAL_COMMUNITY): Payer: Self-pay | Admitting: *Deleted

## 2012-11-26 DIAGNOSIS — Z79899 Other long term (current) drug therapy: Secondary | ICD-10-CM | POA: Insufficient documentation

## 2012-11-26 DIAGNOSIS — I10 Essential (primary) hypertension: Secondary | ICD-10-CM | POA: Insufficient documentation

## 2012-11-26 DIAGNOSIS — M5380 Other specified dorsopathies, site unspecified: Secondary | ICD-10-CM | POA: Insufficient documentation

## 2012-11-26 DIAGNOSIS — M129 Arthropathy, unspecified: Secondary | ICD-10-CM | POA: Insufficient documentation

## 2012-11-26 DIAGNOSIS — Z7982 Long term (current) use of aspirin: Secondary | ICD-10-CM | POA: Insufficient documentation

## 2012-11-26 DIAGNOSIS — K5732 Diverticulitis of large intestine without perforation or abscess without bleeding: Secondary | ICD-10-CM | POA: Insufficient documentation

## 2012-11-26 DIAGNOSIS — R110 Nausea: Secondary | ICD-10-CM | POA: Insufficient documentation

## 2012-11-26 DIAGNOSIS — Z860100 Personal history of colon polyps, unspecified: Secondary | ICD-10-CM | POA: Insufficient documentation

## 2012-11-26 DIAGNOSIS — M62830 Muscle spasm of back: Secondary | ICD-10-CM

## 2012-11-26 DIAGNOSIS — E119 Type 2 diabetes mellitus without complications: Secondary | ICD-10-CM | POA: Insufficient documentation

## 2012-11-26 DIAGNOSIS — K589 Irritable bowel syndrome without diarrhea: Secondary | ICD-10-CM | POA: Insufficient documentation

## 2012-11-26 DIAGNOSIS — Z853 Personal history of malignant neoplasm of breast: Secondary | ICD-10-CM | POA: Insufficient documentation

## 2012-11-26 DIAGNOSIS — K219 Gastro-esophageal reflux disease without esophagitis: Secondary | ICD-10-CM | POA: Insufficient documentation

## 2012-11-26 DIAGNOSIS — M545 Low back pain, unspecified: Secondary | ICD-10-CM | POA: Insufficient documentation

## 2012-11-26 DIAGNOSIS — M549 Dorsalgia, unspecified: Secondary | ICD-10-CM

## 2012-11-26 DIAGNOSIS — Z794 Long term (current) use of insulin: Secondary | ICD-10-CM | POA: Insufficient documentation

## 2012-11-26 DIAGNOSIS — Z8719 Personal history of other diseases of the digestive system: Secondary | ICD-10-CM | POA: Insufficient documentation

## 2012-11-26 MED ORDER — DIAZEPAM 5 MG/ML IJ SOLN
5.0000 mg | Freq: Once | INTRAMUSCULAR | Status: AC
  Administered 2012-11-26: 5 mg via INTRAVENOUS
  Filled 2012-11-26: qty 2

## 2012-11-26 MED ORDER — DEXAMETHASONE SODIUM PHOSPHATE 4 MG/ML IJ SOLN
8.0000 mg | Freq: Once | INTRAMUSCULAR | Status: AC
  Administered 2012-11-26: 8 mg via INTRAVENOUS
  Filled 2012-11-26: qty 2

## 2012-11-26 MED ORDER — SODIUM CHLORIDE 0.9 % IV SOLN
INTRAVENOUS | Status: DC
  Administered 2012-11-26: 08:00:00 via INTRAVENOUS

## 2012-11-26 MED ORDER — MORPHINE SULFATE 4 MG/ML IJ SOLN
4.0000 mg | Freq: Once | INTRAMUSCULAR | Status: AC
  Administered 2012-11-26: 4 mg via INTRAVENOUS
  Filled 2012-11-26: qty 1

## 2012-11-26 MED ORDER — OXYCODONE-ACETAMINOPHEN 5-325 MG PO TABS: ORAL_TABLET | ORAL | Status: DC

## 2012-11-26 MED ORDER — CYCLOBENZAPRINE HCL 10 MG PO TABS: 10.0000 mg | ORAL_TABLET | Freq: Three times a day (TID) | ORAL | Status: DC | PRN

## 2012-11-26 NOTE — ED Notes (Signed)
Pt stated she is feeling better, tolerating ice chips without difficulty. Family at bedside.

## 2012-11-26 NOTE — ED Notes (Signed)
Warm blanket given. Family at bedside.

## 2012-11-26 NOTE — ED Notes (Signed)
Lower back pain x 2 days ago.  Denies new injury.

## 2012-11-26 NOTE — ED Notes (Signed)
Pt resting in bed, voices no complaints

## 2012-11-26 NOTE — ED Provider Notes (Signed)
History   This chart was scribed for Ward Givens, MD by Charolett Bumpers, ED Scribe. The patient was seen in room APA19/APA19. Patient's care was started at 0728.   CSN: [id number]  Arrival date & time 11/26/12  0721   First MD Initiated Contact with Patient 11/26/12 [phone]      Chief Complaint  Patient presents with  . Back Pain   Ann Fuller is a 66 y.o. female who presents to the Emergency Department complaining of moderate lower back pain that started 2 days ago. She describes the pain as muscle spasms in her lower right back that radiates to the outer portion of her right leg down to her knee. She denies any numbness or weakness. She reports associated nausea but denies any vomiting. She reports that she did a lot of cooking on thanksgiving, lifting heavy pots and prolonged periods of standing and sat wrapping presents 2 days ago. She reports raising up from a couch and standing aggravates her pain and ice improves the pain. She rates the pain 7/10 currently, and 10/10 at it's worse. She saw Dr. Ouida Sills yesterday for the same complaint and was prescribed Prednisone 50 mg for 2 days then taper, Robaxin 500 mg 1 tablet 3 times daily and 5-325 mg 1 every 4 hours of Hydrocodone. She states that she has been ambulating with a walker but normally able to walk independently. She states that her back pain worsened today and was unable to get up from her couch at home. She reports a h/o similar pain but on the left side, but in 2007 she had a  back surgery on L5-S-1 with fusion and has hardware  in her back by Dr. Franky Macho. That pain resolved after surgery.    Patient is a 66 y.o. female presenting with back pain. The history is provided by the patient. No language interpreter was used.  Back Pain  This is a new problem. The current episode started 2 days ago. The problem occurs constantly. The problem has been gradually worsening. The pain is present in the lumbar spine. Pertinent negatives  include no fever, no numbness and no weakness.   PCP Dr Ouida Sills Neurosurgeon Dr Franky Macho  Past Medical History  Diagnosis Date  . HTN (hypertension)   . Diabetes mellitus   . Adenomatous colon polyp   . Diverticulitis   . Breast cancer 1990  . Internal hemorrhoid   . IBS (irritable bowel syndrome)   . GERD (gastroesophageal reflux disease)   . Dyspepsia   . Hiatal hernia   . Tubular adenoma 11/2010  . Arthritis   . Bulging lumbar disc     Past Surgical History  Procedure Date  . Lower back 2007  . Cholecystectomy   . Tubal ligation   . Left mastectomy   . Esophagogastroduodenoscopy 12/13/2010    Dr. Dot Been hernia, fundal gland type polyps  . Colonoscopy 12/13/2010    Dr. Houston Siren pancolonic diverticular.tubular adenoma  . Colonoscopy 07/13/2003  . Appendectomy     Family History  Problem Relation Age of Onset  . Colon cancer Mother     History  Substance Use Topics  . Smoking status: Never Smoker   . Smokeless tobacco: Never Used  . Alcohol Use: No  She denies any tobacco use but reports occasional alcohol use. Lives at home Lives with spouse  OB History    Grav Para Term Preterm Abortions TAB SAB Ect Mult Living  Review of Systems  Constitutional: Negative for fever and chills.  Respiratory: Negative for shortness of breath.   Gastrointestinal: Positive for nausea. Negative for vomiting.  Musculoskeletal: Positive for back pain.  Neurological: Negative for weakness and numbness.  All other systems reviewed and are negative.    Allergies  Ceftin; Codeine; Metformin and related; Nexium; Omnicef; and Sulfa antibiotics  Home Medications   Current Outpatient Rx  Name  Route  Sig  Dispense  Refill  . ALBUTEROL SULFATE (5 MG/ML) 0.5% IN NEBU   Nebulization   Take 2.5 mg by nebulization every 6 (six) hours as needed.         . ALBUTEROL SULFATE HFA 108 (90 BASE) MCG/ACT IN AERS   Inhalation   Inhale 2 puffs into the  lungs every 6 (six) hours as needed.         Marland Kitchen AMLODIPINE BESYLATE 5 MG PO TABS   Oral   Take 5 mg by mouth daily.           Marland Kitchen AMPICILLIN 500 MG PO CAPS   Oral   Take 500 mg by mouth 4 (four) times daily. Only prior to dental work         . ASPIRIN 81 MG PO TABS   Oral   Take 81 mg by mouth daily.         . ATORVASTATIN CALCIUM 40 MG PO TABS   Oral   Take 40 mg by mouth daily.           Marland Kitchen BENZONATATE 100 MG PO CAPS      Use 1-2 every 4 hours as needed per cough protocol   90 capsule   4   . CARVEDILOL 6.25 MG PO TABS   Oral   Take 6.25 mg by mouth 2 (two) times daily with a meal.         . CHLORTHALIDONE 25 MG PO TABS   Oral   Take 25 mg by mouth daily.           . DEXLANSOPRAZOLE 60 MG PO CPDR   Oral   Take 60 mg by mouth daily.          Marland Kitchen DEXTROMETHORPHAN POLISTIREX ER 30 MG/5ML PO LQCR      Use as directed per cough protocol   89 mL   0   . DICYCLOMINE HCL 10 MG PO CAPS      Take one capsule by mouth prior to meals as needed (no more than four times daily.)   120 capsule   5   . STOOL SOFTENER PO   Oral   Take by mouth as needed.         Marland Kitchen EXENATIDE 10 MCG/0.04ML [place] SOLN   Subcutaneous   Inject into the skin 2 (two) times daily with a meal.           . HYDROCODONE-ACETAMINOPHEN 5-500 MG PO TABS   Oral   Take 1 tablet by mouth every 4 (four) hours as needed.          Marland Kitchen NOVOLOG FLEXPEN [place]      10 units twice daily before meals         . LANTUS SOLOSTAR [place]      55 units every morning before breakfast         . LORAZEPAM 1 MG PO TABS   Oral   Take 1 mg by mouth 2 (two) times daily as needed.          Marland Kitchen  LOSARTAN POTASSIUM 100 MG PO TABS   Oral   Take 100 mg by mouth daily.           . MOMETASONE FUROATE 220 MCG/INH IN AEPB   Inhalation   Inhale 1 puff into the lungs daily.   1 Inhaler   12   . TEMAZEPAM 15 MG PO CAPS   Oral   Take 15 mg by mouth at bedtime as needed. Take 1-2 capsules at bedtime as needed             BP 161/78  Pulse 103  Temp 97.8 F (36.6 C) (Oral)  Resp 18  Ht 5\' 4"  (1.626 m)  Wt 240 lb (108.863 kg)  BMI 41.20 kg/m2  SpO2 93%  Vital signs normal except mild hypertension and tachycardia   Physical Exam  Nursing note and vitals reviewed. Constitutional: She is oriented to person, place, and time. She appears well-developed and well-nourished. She appears distressed.       obese  HENT:  Head: Normocephalic and atraumatic.  Right Ear: External ear normal.  Left Ear: External ear normal.  Nose: Nose normal.  Mouth/Throat: Oropharynx is clear and moist.  Eyes: Conjunctivae normal and EOM are normal. Pupils are equal, round, and reactive to light.  Neck: Normal range of motion. Neck supple. No tracheal deviation present.  Cardiovascular: Normal rate, regular rhythm and normal heart sounds.   No murmur heard. Pulmonary/Chest: Effort normal and breath sounds normal. No respiratory distress. She has no wheezes. She has no rales.  Abdominal: Soft. Bowel sounds are normal. She exhibits no distension. There is no tenderness. There is no rebound and no guarding.  Musculoskeletal: Normal range of motion. She exhibits tenderness. She exhibits no edema.       Non-tender thoracic and lumbar midline spine. Tenderness over the right SIJ that reproduces her complaints of pain. 2+ patellar reflexes that are equal.   Neurological: She is alert and oriented to person, place, and time.  Reflex Scores:      Patellar reflexes are 2+ on the right side and 2+ on the left side. Skin: Skin is warm and dry.  Psychiatric: She has a normal mood and affect. Her behavior is normal.    ED Course  Procedures (including critical care time)   Medications  0.9 %  sodium chloride infusion (  Intravenous New Bag/Given 11/26/12 0806)  diazepam (VALIUM) injection 5 mg (5 mg Intravenous Given 11/26/12 0805)  morphine 4 MG/ML injection 4 mg (4 mg Intravenous Given 11/26/12 0806)  dexamethasone  (DECADRON) injection 8 mg (8 mg Intravenous Given 11/26/12 0806)    DIAGNOSTIC STUDIES: Oxygen Saturation is 93% on room air, adequate by my interpretation.    COORDINATION OF CARE:  07:40-Discussed planned course of treatment with the patient including Valium, Morphine and Decadron, who is agreeable at this time.   09:35 ambulatory to the bathroom, reports one episode of muscle spasm, feels ready to go home.      1. Back pain   2. Muscle spasm of back    New Prescriptions   CYCLOBENZAPRINE (FLEXERIL) 10 MG TABLET    Take 1 tablet (10 mg total) by mouth 3 (three) times daily as needed for muscle spasms.   OXYCODONE-ACETAMINOPHEN (PERCOCET/ROXICET) 5-325 MG PER TABLET    Take 1 or 2 po Q 6hrs for pain    Plan discharge  Devoria Albe, MD, FACEP    MDM   I personally performed the services described in this documentation, which was  scribed in my presence. The recorded information has been reviewed and considered.  Devoria Albe, MD, FACEP      Ward Givens, MD 11/26/12 [phone]

## 2012-11-26 NOTE — ED Notes (Signed)
Pt reports back pain for 3 days, denies any known injury.  Has history of back pain and surgery.  Is on prednisone, upon waking today was unable to walk.

## 2013-01-13 ENCOUNTER — Other Ambulatory Visit: Payer: Self-pay | Admitting: Neurosurgery

## 2013-01-13 DIAGNOSIS — M545 Low back pain: Secondary | ICD-10-CM

## 2013-01-19 ENCOUNTER — Ambulatory Visit
Admission: RE | Admit: 2013-01-19 | Discharge: 2013-01-19 | Disposition: A | Discharge status: Home / Self Care | Payer: BC Managed Care – PPO | Source: Ambulatory Visit | Attending: Neurosurgery | Admitting: Neurosurgery

## 2013-01-19 DIAGNOSIS — M545 Low back pain: Secondary | ICD-10-CM

## 2013-01-19 MED ORDER — GADOBENATE DIMEGLUMINE 529 MG/ML IV SOLN
20.0000 mL | Freq: Once | INTRAVENOUS | Status: AC | PRN
  Administered 2013-01-19: 20 mL via INTRAVENOUS

## 2013-01-26 ENCOUNTER — Other Ambulatory Visit: Payer: Self-pay | Admitting: Neurosurgery

## 2013-01-27 ENCOUNTER — Encounter (HOSPITAL_COMMUNITY): Payer: Self-pay | Admitting: Pharmacy Technician

## 2013-01-29 ENCOUNTER — Encounter (HOSPITAL_COMMUNITY)
Admission: RE | Admit: 2013-01-29 | Discharge: 2013-01-29 | Disposition: A | Discharge status: Home / Self Care | Payer: BC Managed Care – PPO | Source: Ambulatory Visit | Attending: Neurosurgery | Admitting: Neurosurgery

## 2013-01-29 ENCOUNTER — Encounter (HOSPITAL_COMMUNITY): Payer: Self-pay

## 2013-01-29 DIAGNOSIS — Z01810 Encounter for preprocedural cardiovascular examination: Secondary | ICD-10-CM | POA: Insufficient documentation

## 2013-01-29 DIAGNOSIS — Z01812 Encounter for preprocedural laboratory examination: Secondary | ICD-10-CM | POA: Insufficient documentation

## 2013-01-29 HISTORY — DX: Unspecified asthma, uncomplicated: J45.909

## 2013-01-29 LAB — CBC
MCH: 30.1 pg (ref 26.0–34.0)
MCV: 90.3 fL (ref 78.0–100.0)
Platelets: 342 10*3/uL (ref 150–400)
RBC: 4.22 MIL/uL (ref 3.87–5.11)
RDW: 13.6 % (ref 11.5–15.5)
WBC: 10.2 10*3/uL (ref 4.0–10.5)

## 2013-01-29 LAB — BASIC METABOLIC PANEL
CO2: 31 mEq/L (ref 19–32)
Calcium: 10.2 mg/dL (ref 8.4–10.5)
Creatinine, Ser: 0.7 mg/dL (ref 0.50–1.10)
GFR calc non Af Amer: 90 mL/min — ABNORMAL LOW (ref >90)
Sodium: 139 mEq/L (ref 135–145)

## 2013-01-29 LAB — SURGICAL PCR SCREEN: Staphylococcus aureus: NEGATIVE

## 2013-01-29 MED ORDER — VANCOMYCIN HCL 10 G IV SOLR
1500.0000 mg | INTRAVENOUS | Status: DC
  Filled 2013-01-29: qty 1500

## 2013-01-29 NOTE — Pre-Procedure Instructions (Signed)
CHONDRA BOYDE  01/29/2013   Your procedure is scheduled on:  Friday January 30, 2013  Report to [facility] at 1200 PM.  Call this number if you have problems the morning of surgery: [phone]   Remember:   Do not eat food or drink liquids after midnight.   Take these medicines the morning of surgery with A SIP OF WATER: Amlodipine, Carvedilol, Dexilant, and Hydrocodone-Acetaminophen . Bring inhalers. No insulin morning of surgery.   Do not wear jewelry, make-up or nail polish.  Do not wear lotions, powders, or perfumes. You may wear deodorant.  Do not shave 48 hours prior to surgery.   Do not bring valuables to the hospital.  Contacts, dentures or bridgework may not be worn into surgery.  Leave suitcase in the car. After surgery it may be brought to your room.  For patients admitted to the hospital, checkout time is 11:00 AM the day of  discharge.   Patients discharged the day of surgery will not be allowed to drive  home.    Special Instructions: Shower using CHG 2 nights before surgery and the night before surgery.  If you shower the day of surgery use CHG.  Use special wash - you have one bottle of CHG for all showers.  You should use approximately 1/3 of the bottle for each shower.   Please read over the following fact sheets that you were given: Pain Booklet, Coughing and Deep Breathing, MRSA Information and Surgical Site Infection Prevention

## 2013-01-29 NOTE — Pre-Procedure Instructions (Signed)
XANDREA CLAREY  01/29/2013   Your procedure is scheduled on:  Friday, February 7th.   Report to [facility] at 1200 noon.    Call this number if you have problems the morning of surgery: [phone]   Remember:   Do not eat food or drink liquids after midnight.     Take these medicines the morning of surgery with A SIP OF WATER: Amlodipine (Norvasc), Carvedilol ([place]), Dexlansoprazole (Dexilant). May use inhalers and bring Albuterol inhaler to the hospital with you.  May take Benzonatate (Tessalon), Dicylomine ([place]l),  Lorazepam (Ativan) if needed.   Do not wear jewelry, make-up or nail polish.  Do not wear lotions, powders, or perfumes. You may wear deodorant.  Do not shave 48 hours prior to surgery.   Do not bring valuables to the hospital.  Contacts, dentures or bridgework may not be worn into surgery.  Leave suitcase in the car. After surgery it may be brought to your room.  For patients admitted to the hospital, checkout time is 11:00 AM the day of discharge.   Patients discharged the day of surgery will not be allowed to drive home.  Name and phone number of your driver: -   Special Instructions: Shower with CHG wash ([place]act[place]) tonight and again in the am prior to arriving to hospital.    Please read over the following fact sheets that you were given: Pain Booklet, Coughing and Deep Breathing and Surgical Site Infection Prevention

## 2013-01-30 ENCOUNTER — Ambulatory Visit (HOSPITAL_COMMUNITY)
Admission: RE | Admit: 2013-01-30 | Discharge: 2013-01-30 | Disposition: A | Discharge status: Home / Self Care | Payer: BC Managed Care – PPO | Source: Ambulatory Visit | Attending: Neurosurgery | Admitting: Neurosurgery

## 2013-01-30 ENCOUNTER — Encounter (HOSPITAL_COMMUNITY): Payer: Self-pay | Admitting: Vascular Surgery

## 2013-01-30 ENCOUNTER — Encounter (HOSPITAL_COMMUNITY)
Admission: RE | Disposition: A | Discharge status: Home / Self Care | Payer: Self-pay | Source: Ambulatory Visit | Attending: Neurosurgery

## 2013-01-30 ENCOUNTER — Ambulatory Visit (HOSPITAL_COMMUNITY): Payer: BC Managed Care – PPO | Admitting: Vascular Surgery

## 2013-01-30 DIAGNOSIS — Z538 Procedure and treatment not carried out for other reasons: Secondary | ICD-10-CM | POA: Insufficient documentation

## 2013-01-30 DIAGNOSIS — M5126 Other intervertebral disc displacement, lumbar region: Secondary | ICD-10-CM | POA: Insufficient documentation

## 2013-01-30 SURGERY — CANCELLED PROCEDURE
Anesthesia: General

## 2013-01-30 MED ORDER — ONDANSETRON HCL 4 MG/2ML IJ SOLN: 4.0000 mg | Freq: Four times a day (QID) | INTRAMUSCULAR | Status: DC | PRN

## 2013-01-30 MED ORDER — LACTATED RINGERS IV SOLN
INTRAVENOUS | Status: DC | PRN
  Administered 2013-01-30: 15:00:00 via INTRAVENOUS

## 2013-01-30 MED ORDER — FENTANYL CITRATE 0.05 MG/ML IJ SOLN: 25.0000 ug | INTRAMUSCULAR | Status: DC | PRN

## 2013-01-30 SURGICAL SUPPLY — 41 items
APL SKNCLS STERI-STRIP NONHPOA (GAUZE/BANDAGES/DRESSINGS)
BAG DECANTER FOR FLEXI CONT (MISCELLANEOUS) ×3 IMPLANT
BENZOIN TINCTURE PRP APPL 2/3 (GAUZE/BANDAGES/DRESSINGS) IMPLANT
BLADE SURG ROTATE 9660 (MISCELLANEOUS) IMPLANT
CANISTER SUCTION 2500CC (MISCELLANEOUS) ×3 IMPLANT
CLOTH BEACON ORANGE TIMEOUT ST (SAFETY) ×3 IMPLANT
CONT SPEC 4OZ CLIKSEAL STRL BL (MISCELLANEOUS) ×3 IMPLANT
DECANTER SPIKE VIAL GLASS SM (MISCELLANEOUS) ×3 IMPLANT
DRAPE LAPAROTOMY 100X72X124 (DRAPES) ×3 IMPLANT
DRAPE MICROSCOPE LEICA (MISCELLANEOUS) ×3 IMPLANT
DRAPE POUCH INSTRU U-SHP 10X18 (DRAPES) ×3 IMPLANT
DRAPE SURG 17X23 STRL (DRAPES) ×3 IMPLANT
DURAPREP 26ML APPLICATOR (WOUND CARE) ×3 IMPLANT
ELECT REM PT RETURN 9FT ADLT (ELECTROSURGICAL) ×2
ELECTRODE REM PT RTRN 9FT ADLT (ELECTROSURGICAL) ×2 IMPLANT
GAUZE SPONGE 4X4 16PLY XRAY LF (GAUZE/BANDAGES/DRESSINGS) IMPLANT
GLOVE ECLIPSE 6.5 STRL STRAW (GLOVE) ×3 IMPLANT
GLOVE EXAM NITRILE LRG STRL (GLOVE) IMPLANT
GLOVE EXAM NITRILE MD LF STRL (GLOVE) IMPLANT
GLOVE EXAM NITRILE XL STR (GLOVE) IMPLANT
GLOVE EXAM NITRILE XS STR PU (GLOVE) IMPLANT
GOWN BRE IMP SLV AUR LG STRL (GOWN DISPOSABLE) ×6 IMPLANT
GOWN BRE IMP SLV AUR XL STRL (GOWN DISPOSABLE) IMPLANT
GOWN STRL REIN 2XL LVL4 (GOWN DISPOSABLE) IMPLANT
KIT BASIN OR (CUSTOM PROCEDURE TRAY) ×3 IMPLANT
KIT ROOM TURNOVER OR (KITS) ×3 IMPLANT
NDL HYPO 25X1 1.5 SAFETY (NEEDLE) ×1 IMPLANT
NDL SPNL 18GX3.5 QUINCKE PK (NEEDLE) IMPLANT
NEEDLE HYPO 25X1 1.5 SAFETY (NEEDLE) ×2 IMPLANT
NEEDLE SPNL 18GX3.5 QUINCKE PK (NEEDLE) IMPLANT
NS IRRIG 1000ML POUR BTL (IV SOLUTION) ×3 IMPLANT
PAD ARMBOARD 7.5X6 YLW CONV (MISCELLANEOUS) ×9 IMPLANT
RUBBERBAND STERILE (MISCELLANEOUS) ×6 IMPLANT
SPONGE GAUZE 4X4 12PLY (GAUZE/BANDAGES/DRESSINGS) IMPLANT
SPONGE LAP 4X18 X RAY DECT (DISPOSABLE) IMPLANT
SPONGE SURGIFOAM ABS GEL SZ50 (HEMOSTASIS) ×1 IMPLANT
STRIP CLOSURE SKIN 1/2X4 (GAUZE/BANDAGES/DRESSINGS) IMPLANT
SYR 20ML ECCENTRIC (SYRINGE) ×3 IMPLANT
TOWEL OR 17X24 6PK STRL BLUE (TOWEL DISPOSABLE) ×3 IMPLANT
TOWEL OR 17X26 10 PK STRL BLUE (TOWEL DISPOSABLE) ×3 IMPLANT
WATER STERILE IRR 1000ML POUR (IV SOLUTION) ×3 IMPLANT

## 2013-01-30 NOTE — H&P (Signed)
  BP 134/75  Pulse 80  Temp 98.2 F (36.8 C) (Oral)  Resp 18  SpO2 98%  Ann Fuller returns today.  She states that on the 4th of December she had the acute onset of severe pain in the lower back.  However, when flushing out the history, she says this pain really started sometime around Thanksgiving.  She was doing a lot of cooking, a lot of bending and lifting and had some discomfort in her back on the right side into the right lower extremity.  The pain never progressed beyond the knee.  On the 4th she awoke, was walking around the house and just had the acute onset of pain, could barely stand up.  She called for help and eventually was taken to the Hospital Emergency Room at [facility] for evaluation.  She received some medication via an IV.  No imaging was performed and she was sent home and she was able to walk, never had bowel/bladder dysfunction.  She says that she felt the she was weak during this period of time, but has improved.  She feels that she is numb from the right buttocks all of the way down the lower extremity.  The pain is worse at night.  If she stands or for example has to cook for any period of time, her leg will become painful.  She has absolutely no symptoms on the left side.  She finds it difficult to get through service at church and will have to sit and stand, but sit more often.    SOCIAL HISTORY:    She is 150 cm. in height, weighs 240 lbs.    MEDICATIONS:    Medications currently are as follows:  Flexeril, Percocet, Amlodipine, Aspirin, [place]etta, Carvedilol, Chlorthalidone, Dexlansoprazole, Dicyclomine, Colace, Lantus, Lipitor, Lorazepam, Losartan, Mometasone, Novolog, Temazepam, Ventolin and Albuterol.    DRUG ALLERGIES:   SHE IS ALLERGIC TO CODEINE, WHICH CAUSES HER TONGUE TO SWELL.    EXAMINATION:    On examination she is alert, oriented x 4 and answering all questions appropriately.  Memory, language, attention span and fund of knowledge are normal.  Well kempt and in  frankly no distress.  She is able to toe walk, heel walk and do a 14-inch step without difficulty.  Unable to elicit reflexes at the knees.  Did not elicit reflexes at the ankles either.  Romberg is negative.  Normal muscle tone, bulk and coordination.  She is obese.  Gait is otherwise normal.  Symmetric facies.  Symmetric facial sensation.  Hearing intact to voice.    SUMMARY:  MRI shows a very large disc herniation at L2-3 on the right side.  This is the reason that she is having the pain in her back and right lower extremity. She has a solid fusion at 4-5 and no problems there, but this is the reason why she is having these problems now.    She has agreed to undergo operative decompression at L2-3 on the right side this coming Friday.  I would expect her to be out of the hospital on Saturday.  She otherwise is doing well.  Risks and benefits were discussed as was expectations. She understands and would like to proceed.     Marland Kitchen

## 2013-01-30 NOTE — Anesthesia Preprocedure Evaluation (Addendum)
Anesthesia Evaluation  Patient identified by MRN, date of birth, ID band Patient awake    Reviewed: Allergy & Precautions, H&P , NPO status , Patient's Chart, lab work & pertinent test results  History of Anesthesia Complications (+) PONV  Airway Mallampati: II TM Distance: >3 FB Neck ROM: full    Dental  (+) Teeth Intact and Dental Advisory Given   Pulmonary shortness of breath, asthma ,          Cardiovascular Exercise Tolerance: Poor hypertension, Pt. on medications and Pt. on home beta blockers + DOE     Neuro/Psych  Neuromuscular disease (pain, weakness, numbness LLE>RLE) negative psych ROS   GI/Hepatic Neg liver ROS, hiatal hernia, GERD-  Medicated and Controlled,  Endo/Other  diabetes, Well Controlled, Type 2, Insulin DependentMorbid obesity  Renal/GU negative Renal ROS     Musculoskeletal  (+) Arthritis -, Osteoarthritis,    Abdominal (+) + obese,   Peds  Hematology breast CA   Anesthesia Other Findings   Reproductive/Obstetrics negative OB ROS                         Anesthesia Physical Anesthesia Plan  ASA: III  Anesthesia Plan: General   Post-op Pain Management:    Induction: Intravenous  Airway Management Planned: Oral ETT  Additional Equipment:   Intra-op Plan:   Post-operative Plan: Extubation in OR  Informed Consent: I have reviewed the patients History and Physical, chart, labs and discussed the procedure including the risks, benefits and alternatives for the proposed anesthesia with the patient or authorized representative who has indicated his/her understanding and acceptance.     Plan Discussed with: CRNA and Surgeon  Anesthesia Plan Comments:         Anesthesia Quick Evaluation

## 2013-01-30 NOTE — Progress Notes (Signed)
Pre procedure done by k. gallman

## 2013-01-30 NOTE — Preoperative (Signed)
Beta Blockers   Reason not to administer Beta Blockers:carvedilol 01/30/13 1100

## 2013-05-25 ENCOUNTER — Ambulatory Visit (INDEPENDENT_AMBULATORY_CARE_PROVIDER_SITE_OTHER): Payer: BC Managed Care – PPO | Admitting: Critical Care Medicine

## 2013-05-25 ENCOUNTER — Encounter: Payer: Self-pay | Admitting: Critical Care Medicine

## 2013-05-25 VITALS — BP 150/72 | HR 74 | Temp 96.9°F | Ht 59.0 in | Wt 230.0 lb

## 2013-05-25 DIAGNOSIS — J45909 Unspecified asthma, uncomplicated: Secondary | ICD-10-CM

## 2013-05-25 MED ORDER — LEVOFLOXACIN 500 MG PO TABS: 500.0000 mg | ORAL_TABLET | Freq: Every day | ORAL | Status: DC

## 2013-05-25 NOTE — Assessment & Plan Note (Signed)
Moderate persistent asthma with ongoing cyclical cough and recent tracheobronchitis with slow to improve Plan Stop Asmanex Start Dulera 200 two puff twice daily , use samples Hold asmanex for now 4 more doses of levaquin will be given Finish prednisone Return 1 month Note pt needs Tdap and pneumonia vaccine on return

## 2013-05-25 NOTE — Patient Instructions (Addendum)
Stop Asmanex Start Dulera 200 two puff twice daily , use samples Hold asmanex for now 4 more doses of levaquin will be given Finish prednisone Return 1 month Note pt needs Tdap and pneumonia vaccine on return

## 2013-05-25 NOTE — Progress Notes (Signed)
Subjective:    Patient ID: Ann Fuller, female    DOB: 02-01-1946, 67 y.o.   MRN: [id number]  HPI 67 yo female with known hx of cyclical cough and RAD   05/25/2013 Chief Complaint  Patient presents with  . Asthma    Breathing has gotten worse. Reports SOB, coughing with production of yellow thick mucus, wheezing and chest tightness.   Pt worse over one week, pt had to visit [facility]. and then saw prior to visit  PCP.  Cough severe > rx abx/pred/cough syrup This helped but is still coughing. Now has a few pred left. Abx is left two more doses Levaquin (7d) Pt has white and not as thick.  Notes some wheezing  Note pt is due Tdap and pneumonia vaccine  PUL ASTHMA HISTORY 05/25/2013 11/12/2012  Symptoms Daily 0-2 days/week  Nighttime awakenings Often--7/wk 0-2/month  Interference with activity Minor limitations No limitations  SABA use Several times/day 0-2 days/wk  Exacerbations requiring oral steroids 0-1 / year 0-1 / year     Review of Systems Constitutional:   No  weight loss, night sweats,  Fevers, chills, fatigue, or  lassitude.  HEENT:   No headaches,  Difficulty swallowing,  Tooth/dental problems, or  Sore throat,                No sneezing, itching,  n0 ear ache, nasal congestion, post nasal drip,   CV:  No chest pain,  Orthopnea, PND, swelling in lower extremities, anasarca, dizziness, palpitations, syncope.   GI  No heartburn, indigestion, abdominal pain, nausea, vomiting, diarrhea, change in bowel habits, loss of appetite, bloody stools.   Resp:  No coughing up of blood.    No chest wall deformity  Skin: no rash or lesions.  GU: no dysuria, change in color of urine, no urgency or frequency.  No flank pain, no hematuria   MS:  No joint pain or swelling.  No decreased range of motion.  No back pain.  Psych:  No change in mood or affect. No depression or anxiety.  No memory loss.         Objective:   Physical Exam BP 150/72  Pulse 74  Temp(Src) 96.9 F  (36.1 C) (Oral)  Ht 4\' 11"  (1.499 m)  Wt 230 lb (104.327 kg)  BMI 46.43 kg/m2  SpO2 96%   GEN: A/Ox3; pleasant , NAD, well nourished   HEENT:  [place]/AT,  EACs-clear, TMs-wnl, NOSE-clear drainage , THROAT-clear, no lesions, no postnasal drip or exudate noted.   NECK:  Supple w/ fair ROM; no JVD; normal carotid impulses w/o bruits; no thyromegaly or nodules palpated; no lymphadenopathy.  RESP few expiratory wheezes  CARD:  RRR, no m/r/g  , no peripheral edema, pulses intact, no cyanosis or clubbing.  GI:   Soft & nt; nml bowel sounds; no organomegaly or masses detected.  Musco: Warm bil, no deformities or joint swelling noted.   Neuro: alert, no focal deficits noted.    Skin: Warm, no lesions or rashes        Assessment & Plan:   Moderate persistent asthma do to atopic features Moderate persistent asthma with ongoing cyclical cough and recent tracheobronchitis with slow to improve Plan Stop Asmanex Start Dulera 200 two puff twice daily , use samples Hold asmanex for now 4 more doses of levaquin will be given Finish prednisone Return 1 month Note pt needs Tdap and pneumonia vaccine on return      Updated Medication List Outpatient Encounter Prescriptions  as of 05/25/2013  Medication Sig Dispense Refill  . albuterol (VENTOLIN HFA) 108 (90 BASE) MCG/ACT inhaler Inhale 2 puffs into the lungs every 6 (six) hours as needed. Shortness of Breath      . amLODipine (NORVASC) 5 MG tablet Take 5 mg by mouth daily.        Marland Kitchen ampicillin (PRINCIPEN) 500 MG capsule Take 4 tablets prior to dental cleanings/sugrery      . aspirin 81 MG tablet Take 81 mg by mouth daily.      Marland Kitchen atorvastatin (LIPITOR) 40 MG tablet Take 40 mg by mouth daily.        . benzonatate (TESSALON) 100 MG capsule Take 100 mg by mouth 3 (three) times daily as needed. As needed for cough.      . carvedilol (COREG) 6.25 MG tablet Take 6.25 mg by mouth 2 (two) times daily with a meal.      . chlorthalidone (HYGROTON)  25 MG tablet Take 25 mg by mouth daily.        Marland Kitchen dexlansoprazole (DEXILANT) 60 MG capsule Take 60 mg by mouth daily.       Marland Kitchen dicyclomine (BENTYL) 10 MG capsule Take 10 mg by mouth 4 (four) times daily as needed. As needed for irritable bowel.      Marland Kitchen exenatide (BYETTA 10 MCG PEN) 10 MCG/0.04ML SOLN Inject 10 mcg into the skin 2 (two) times daily with a meal.       . HYDROcodone-acetaminophen (NORCO/VICODIN) 5-325 MG per tablet Take 1 tablet by mouth every 4 (four) hours as needed for pain.      Marland Kitchen HYDROcodone-homatropine (HYDROMET) 5-1.5 MG/5ML syrup Take 5 mLs by mouth every 4 (four) hours as needed for cough.      . Insulin Aspart (NOVOLOG FLEXPEN [place]) 10 units twice daily before meals      . Insulin Glargine (LANTUS SOLOSTAR [place]) 55 units every morning before breakfast      . levofloxacin (LEVAQUIN) 500 MG tablet Take 1 tablet (500 mg total) by mouth daily.  4 tablet  0  . LORazepam (ATIVAN) 1 MG tablet Take 1 mg by mouth 2 (two) times daily as needed. Anxiety/Sleep      . losartan (COZAAR) 100 MG tablet Take 100 mg by mouth daily.        . predniSONE (DELTASONE) 10 MG tablet Take 4 tablets x 3 days, 3 tablets x 3 days, 2 tablets x 3 days, 1 tablet x 3 days then stop      . temazepam (RESTORIL) 15 MG capsule Take 15 mg by mouth at bedtime as needed. Sleep      . [DISCONTINUED] HYDROcodone-acetaminophen (VICODIN) 5-500 MG per tablet Take 1 tablet by mouth every 4 (four) hours as needed. Pain      . [DISCONTINUED] levofloxacin (LEVAQUIN) 500 MG tablet Take 500 mg by mouth daily.      . [DISCONTINUED] mometasone (ASMANEX 120 METERED DOSES) 220 MCG/INH inhaler Inhale 1 puff into the lungs daily.  1 Inhaler  12   Facility-Administered Encounter Medications as of 05/25/2013  Medication Dose Route Frequency Provider Last Rate Last Dose  . lactated ringers infusion    Continuous PRN Adria Dill, CRNA

## 2013-07-01 ENCOUNTER — Ambulatory Visit: Payer: Medicare Other | Admitting: Critical Care Medicine

## 2013-07-13 ENCOUNTER — Ambulatory Visit (INDEPENDENT_AMBULATORY_CARE_PROVIDER_SITE_OTHER): Payer: BC Managed Care – PPO | Admitting: Critical Care Medicine

## 2013-07-13 ENCOUNTER — Encounter: Payer: Self-pay | Admitting: Critical Care Medicine

## 2013-07-13 VITALS — BP 156/90 | HR 79 | Temp 98.6°F | Ht 59.0 in | Wt 230.6 lb

## 2013-07-13 DIAGNOSIS — J45909 Unspecified asthma, uncomplicated: Secondary | ICD-10-CM

## 2013-07-13 MED ORDER — MOMETASONE FURO-FORMOTEROL FUM 200-5 MCG/ACT IN AERO: 2.0000 | INHALATION_SPRAY | Freq: Two times a day (BID) | RESPIRATORY_TRACT | Status: DC

## 2013-07-13 NOTE — Progress Notes (Signed)
Subjective:    Patient ID: Ann Fuller, female    DOB: December 05, 1946, 67 y.o.   MRN: [id number]  HPI  67 yo female with known hx of cyclical cough and RAD     PUL ASTHMA HISTORY 07/14/2013 05/25/2013 11/12/2012  Symptoms 0-2 days/week Daily 0-2 days/week  Nighttime awakenings 0-2/month Often--7/wk 0-2/month  Interference with activity No limitations Minor limitations No limitations  SABA use 0-2 days/wk Several times/day 0-2 days/wk  Exacerbations requiring oral steroids 0-1 / year 0-1 / year 0-1 / year   07/13/2013 Chief Complaint  Patient presents with  . 1 month follow up    Breathing has improved since last OV.  DOes have DOE, some wheezing qhs, and chest tightness qhs at times.  No cough.   Now is better. Less cough and congestion.  No wheezing, but some at night.  No real chest pain.   No real nasal congestion.  Occ edema in feet.  No heartburn No real nasal drainage. Now out of dulera.     Review of Systems  Constitutional:   No  weight loss, night sweats,  Fevers, chills, fatigue, or  lassitude.  HEENT:   No headaches,  Difficulty swallowing,  Tooth/dental problems, or  Sore throat,                No sneezing, itching,  n0 ear ache, nasal congestion, post nasal drip,   CV:  No chest pain,  Orthopnea, PND, swelling in lower extremities, anasarca, dizziness, palpitations, syncope.   GI  No heartburn, indigestion, abdominal pain, nausea, vomiting, diarrhea, change in bowel habits, loss of appetite, bloody stools.   Resp:  No coughing up of blood.    No chest wall deformity  Skin: no rash or lesions.  GU: no dysuria, change in color of urine, no urgency or frequency.  No flank pain, no hematuria   MS:  No joint pain or swelling.  No decreased range of motion.  No back pain.  Psych:  No change in mood or affect. No depression or anxiety.  No memory loss.         Objective:   Physical Exam BP 156/90  Pulse 79  Temp(Src) 98.6 F (37 C) (Oral)  Ht 4\' 11"   (1.499 m)  Wt 230 lb 9.6 oz (104.599 kg)  BMI 46.55 kg/m2  SpO2 97%   GEN: A/Ox3; pleasant , NAD, well nourished   HEENT:  [place]/AT,  EACs-clear, TMs-wnl, NOSE-clear drainage , THROAT-clear, no lesions, no postnasal drip or exudate noted.   NECK:  Supple w/ fair ROM; no JVD; normal carotid impulses w/o bruits; no thyromegaly or nodules palpated; no lymphadenopathy.  RESP no expired wheezes CARD:  RRR, no m/r/g  , no peripheral edema, pulses intact, no cyanosis or clubbing.  GI:   Soft & nt; nml bowel sounds; no organomegaly or masses detected.  Musco: Warm bil, no deformities or joint swelling noted.   Neuro: alert, no focal deficits noted.    Skin: Warm, no lesions or rashes        Assessment & Plan:   Moderate persistent asthma do to atopic features Cyclical cough on the basis of reactive airways disease with moderate persistent asthma do to reflux disease and allergic rhinitis No previous spirometry was normal Symptoms all improved with asthma care Plan Stay on [facility] 2 inhalations twice daily    Updated Medication List Outpatient Encounter Prescriptions as of 07/13/2013  Medication Sig Dispense Refill  . albuterol (VENTOLIN HFA) 108 (90  BASE) MCG/ACT inhaler Inhale 2 puffs into the lungs every 6 (six) hours as needed. Shortness of Breath      . amLODipine (NORVASC) 5 MG tablet Take 5 mg by mouth daily.        Marland Kitchen ampicillin (PRINCIPEN) 500 MG capsule Take 4 tablets prior to dental cleanings/sugrery      . aspirin 81 MG tablet Take 81 mg by mouth daily.      Marland Kitchen atorvastatin (LIPITOR) 40 MG tablet Take 40 mg by mouth daily.        . benzonatate (TESSALON) 100 MG capsule Take 100 mg by mouth 3 (three) times daily as needed. As needed for cough.      . carvedilol (COREG) 6.25 MG tablet Take 6.25 mg by mouth 2 (two) times daily with a meal.      . chlorthalidone (HYGROTON) 25 MG tablet Take 25 mg by mouth daily.        Marland Kitchen dexlansoprazole (DEXILANT) 60 MG capsule Take 60 mg by  mouth daily.       Marland Kitchen dicyclomine (BENTYL) 10 MG capsule Take 10 mg by mouth 4 (four) times daily as needed. As needed for irritable bowel.      Marland Kitchen exenatide (BYETTA 10 MCG PEN) 10 MCG/0.04ML SOLN Inject 10 mcg into the skin 2 (two) times daily with a meal.       . HYDROcodone-acetaminophen (NORCO/VICODIN) 5-325 MG per tablet Take 1 tablet by mouth every 4 (four) hours as needed for pain.      Marland Kitchen HYDROcodone-homatropine (HYDROMET) 5-1.5 MG/5ML syrup Take 5 mLs by mouth every 4 (four) hours as needed for cough.      . Insulin Aspart (NOVOLOG FLEXPEN [place]) 10 units twice daily before meals      . Insulin Glargine (LANTUS SOLOSTAR [place]) 55 units every morning before breakfast      . LORazepam (ATIVAN) 1 MG tablet Take 1 mg by mouth 2 (two) times daily as needed. Anxiety/Sleep      . losartan (COZAAR) 100 MG tablet Take 100 mg by mouth daily.        . mometasone-formoterol (DULERA) 200-5 MCG/ACT AERO Inhale 2 puffs into the lungs 2 (two) times daily.  1 Inhaler  6  . predniSONE (DELTASONE) 10 MG tablet Taper as directed by Dr. Charlett Blake      . temazepam (RESTORIL) 15 MG capsule Take 15 mg by mouth at bedtime as needed. Sleep      . [DISCONTINUED] mometasone-formoterol (DULERA) 200-5 MCG/ACT AERO Inhale 2 puffs into the lungs 2 (two) times daily.      . [DISCONTINUED] levofloxacin (LEVAQUIN) 500 MG tablet Take 1 tablet (500 mg total) by mouth daily.  4 tablet  0   Facility-Administered Encounter Medications as of 07/13/2013  Medication Dose Route Frequency Provider Last Rate Last Dose  . lactated ringers infusion    Continuous PRN Adria Dill, CRNA

## 2013-07-13 NOTE — Patient Instructions (Addendum)
Stay on Dulera two puff twice daily, use spacer Return 4 months

## 2013-07-14 NOTE — Assessment & Plan Note (Addendum)
Cyclical cough on the basis of reactive airways disease with moderate persistent asthma do to reflux disease and allergic rhinitis No previous spirometry was normal Symptoms all improved with asthma care Plan Stay on [facility] 2 inhalations twice daily

## 2013-07-16 ENCOUNTER — Other Ambulatory Visit: Payer: Self-pay | Admitting: Orthopedic Surgery

## 2013-07-16 DIAGNOSIS — M5410 Radiculopathy, site unspecified: Secondary | ICD-10-CM

## 2013-07-19 ENCOUNTER — Other Ambulatory Visit: Payer: Medicare Other

## 2013-07-22 ENCOUNTER — Ambulatory Visit (HOSPITAL_COMMUNITY): Payer: BC Managed Care – PPO

## 2013-08-17 ENCOUNTER — Encounter (HOSPITAL_COMMUNITY): Payer: Self-pay | Admitting: Pharmacy Technician

## 2013-08-17 NOTE — Patient Instructions (Addendum)
Your procedure is scheduled on: 08/27/2013   Report to [facility] at  800  AM.  Call this number if you have problems the morning of surgery: [phone]   Do not eat food or drink liquids :After Midnight.      Take these medicines the morning of surgery with A SIP OF WATER: norco, ativan, norvasc, coreg, chlothiladone,dexilant, cozaar. Take both of your inhalers before you come and bring them with you. Take 1/2 of your Lantus dosage the night before your surgery. No medication for daibetes the morning of your surgery. (Patient states that she takes her Lantus in the morning. Told to not take any medication for diabetes before surgery. Patient states that she takes Coreg, Chlothiadone, Dexilant,Cozaar, Norco,and Ativan in the mornings. Patient told that she may take those medications.)   Do not wear jewelry, make-up or nail polish.  Do not wear lotions, powders, or perfumes.   Do not shave 48 hours prior to surgery.  Do not bring valuables to the hospital.  Contacts, dentures or bridgework may not be worn into surgery.  Leave suitcase in the car. After surgery it may be brought to your room.  For patients admitted to the hospital, checkout time is 11:00 AM the day of discharge.   Patients discharged the day of surgery will not be allowed to drive home.  :     Please read over the following fact sheets that you were given: Coughing and Deep Breathing, Surgical Site Infection Prevention, Anesthesia Post-op Instructions and Care and Recovery After Surgery    Cataract A cataract is a clouding of the lens of the eye. When a lens becomes cloudy, vision is reduced based on the degree and nature of the clouding. Many cataracts reduce vision to some degree. Some cataracts make people more near-sighted as they develop. Other cataracts increase glare. Cataracts that are ignored and become worse can sometimes look white. The white color can be seen through the pupil. CAUSES   Aging. However, cataracts may  occur at any age, even in newborns.   Certain drugs.   Trauma to the eye.   Certain diseases such as diabetes.   Specific eye diseases such as chronic inflammation inside the eye or a sudden attack of a rare form of glaucoma.   Inherited or acquired medical problems.  SYMPTOMS   Gradual, progressive drop in vision in the affected eye.   Severe, rapid visual loss. This most often happens when trauma is the cause.  DIAGNOSIS  To detect a cataract, an eye doctor examines the lens. Cataracts are best diagnosed with an exam of the eyes with the pupils enlarged (dilated) by drops.  TREATMENT  For an early cataract, vision may improve by using different eyeglasses or stronger lighting. If that does not help your vision, surgery is the only effective treatment. A cataract needs to be surgically removed when vision loss interferes with your everyday activities, such as driving, reading, or watching TV. A cataract may also have to be removed if it prevents examination or treatment of another eye problem. Surgery removes the cloudy lens and usually replaces it with a substitute lens (intraocular lens, IOL).  At a time when both you and your doctor agree, the cataract will be surgically removed. If you have cataracts in both eyes, only one is usually removed at a time. This allows the operated eye to heal and be out of danger from any possible problems after surgery (such as infection or poor wound healing).  In rare cases, a cataract may be doing damage to your eye. In these cases, your caregiver may advise surgical removal right away. The vast majority of people who have cataract surgery have better vision afterward. HOME CARE INSTRUCTIONS  If you are not planning surgery, you may be asked to do the following:  Use different eyeglasses.   Use stronger or brighter lighting.   Ask your eye doctor about reducing your medicine dose or changing medicines if it is thought that a medicine caused your  cataract. Changing medicines does not make the cataract go away on its own.   Become familiar with your surroundings. Poor vision can lead to injury. Avoid bumping into things on the affected side. You are at a higher risk for tripping or falling.   Exercise extreme care when driving or operating machinery.   Wear sunglasses if you are sensitive to bright light or experiencing problems with glare.  SEEK IMMEDIATE MEDICAL CARE IF:   You have a worsening or sudden vision loss.   You notice redness, swelling, or increasing pain in the eye.   You have a fever.  Document Released: 12/10/2005 Document Revised: 11/29/2011 Document Reviewed: 08/03/2011 [facility] Patient Information 2012 [place], [place].PATIENT INSTRUCTIONS POST-ANESTHESIA  IMMEDIATELY FOLLOWING SURGERY:  Do not drive or operate machinery for the first twenty four hours after surgery.  Do not make any important decisions for twenty four hours after surgery or while taking narcotic pain medications or sedatives.  If you develop intractable nausea and vomiting or a severe headache please notify your doctor immediately.  FOLLOW-UP:  Please make an appointment with your surgeon as instructed. You do not need to follow up with anesthesia unless specifically instructed to do so.  WOUND CARE INSTRUCTIONS (if applicable):  Keep a dry clean dressing on the anesthesia/puncture wound site if there is drainage.  Once the wound has quit draining you may leave it open to air.  Generally you should leave the bandage intact for twenty four hours unless there is drainage.  If the epidural site drains for more than 36-48 hours please call the anesthesia department.  QUESTIONS?:  Please feel free to call your physician or the hospital operator if you have any questions, and they will be happy to assist you.

## 2013-08-18 ENCOUNTER — Encounter (HOSPITAL_COMMUNITY)
Admission: RE | Admit: 2013-08-18 | Discharge: 2013-08-18 | Disposition: A | Discharge status: Home / Self Care | Payer: Medicare Other | Source: Ambulatory Visit | Attending: Ophthalmology | Admitting: Ophthalmology

## 2013-08-18 ENCOUNTER — Encounter (HOSPITAL_COMMUNITY): Payer: Self-pay

## 2013-08-18 DIAGNOSIS — Z01812 Encounter for preprocedural laboratory examination: Secondary | ICD-10-CM | POA: Insufficient documentation

## 2013-08-18 LAB — BASIC METABOLIC PANEL
BUN: 17 mg/dL (ref 6–23)
Chloride: 93 mEq/L — ABNORMAL LOW (ref 96–112)
GFR calc Af Amer: >90 mL/min (ref >90)
Glucose, Bld: 58 mg/dL — ABNORMAL LOW (ref 70–99)
Potassium: 3.8 mEq/L (ref 3.5–5.1)

## 2013-08-18 MED ORDER — PHENYLEPHRINE HCL 2.5 % OP SOLN: 1.0000 [drp] | OPHTHALMIC | Status: DC

## 2013-08-18 MED ORDER — TETRACAINE HCL 0.5 % OP SOLN: 1.0000 [drp] | OPHTHALMIC | Status: DC

## 2013-08-18 MED ORDER — LIDOCAINE HCL 3.5 % OP GEL: 1.0000 "application " | Freq: Once | OPHTHALMIC | Status: DC

## 2013-08-18 MED ORDER — CYCLOPENTOLATE-PHENYLEPHRINE 0.2-1 % OP SOLN: 1.0000 [drp] | OPHTHALMIC | Status: DC

## 2013-08-26 MED ORDER — LIDOCAINE HCL (PF) 1 % IJ SOLN
INTRAMUSCULAR | Status: AC
  Filled 2013-08-26: qty 2

## 2013-08-26 MED ORDER — LIDOCAINE HCL 3.5 % OP GEL
OPHTHALMIC | Status: AC
  Filled 2013-08-26: qty 5

## 2013-08-26 MED ORDER — TETRACAINE HCL 0.5 % OP SOLN
OPHTHALMIC | Status: AC
  Filled 2013-08-26: qty 2

## 2013-08-26 MED ORDER — CYCLOPENTOLATE-PHENYLEPHRINE OP SOLN OPTIME - NO CHARGE
OPHTHALMIC | Status: AC
  Filled 2013-08-26: qty 2

## 2013-08-26 MED ORDER — NEOMYCIN-POLYMYXIN-DEXAMETH 3.5-10000-0.1 OP OINT
TOPICAL_OINTMENT | OPHTHALMIC | Status: AC
  Filled 2013-08-26: qty 3.5

## 2013-08-27 ENCOUNTER — Encounter (HOSPITAL_COMMUNITY): Payer: Self-pay | Admitting: *Deleted

## 2013-08-27 ENCOUNTER — Encounter (HOSPITAL_COMMUNITY)
Admission: RE | Disposition: A | Discharge status: Home / Self Care | Payer: Self-pay | Source: Ambulatory Visit | Attending: Ophthalmology

## 2013-08-27 ENCOUNTER — Ambulatory Visit (HOSPITAL_COMMUNITY)
Admission: RE | Admit: 2013-08-27 | Discharge: 2013-08-27 | Disposition: A | Discharge status: Home / Self Care | Payer: BC Managed Care – PPO | Source: Ambulatory Visit | Attending: Ophthalmology | Admitting: Ophthalmology

## 2013-08-27 ENCOUNTER — Ambulatory Visit (HOSPITAL_COMMUNITY): Payer: BC Managed Care – PPO | Admitting: Anesthesiology

## 2013-08-27 ENCOUNTER — Encounter (HOSPITAL_COMMUNITY): Payer: Self-pay | Admitting: Anesthesiology

## 2013-08-27 DIAGNOSIS — E119 Type 2 diabetes mellitus without complications: Secondary | ICD-10-CM | POA: Insufficient documentation

## 2013-08-27 DIAGNOSIS — I10 Essential (primary) hypertension: Secondary | ICD-10-CM | POA: Insufficient documentation

## 2013-08-27 DIAGNOSIS — H2589 Other age-related cataract: Secondary | ICD-10-CM | POA: Insufficient documentation

## 2013-08-27 HISTORY — PX: CATARACT EXTRACTION W/PHACO: SHX586

## 2013-08-27 SURGERY — PHACOEMULSIFICATION, CATARACT, WITH IOL INSERTION
Anesthesia: Monitor Anesthesia Care | Site: Eye | Laterality: Left | Wound class: Clean

## 2013-08-27 MED ORDER — EPINEPHRINE HCL 1 MG/ML IJ SOLN
INTRAOCULAR | Status: DC | PRN
  Administered 2013-08-27: 13:00:00

## 2013-08-27 MED ORDER — NEOMYCIN-POLYMYXIN-DEXAMETH 0.1 % OP OINT
TOPICAL_OINTMENT | OPHTHALMIC | Status: DC | PRN
  Administered 2013-08-27: 1 via OPHTHALMIC

## 2013-08-27 MED ORDER — LACTATED RINGERS IV SOLN
INTRAVENOUS | Status: DC | PRN
  Administered 2013-08-27: 12:00:00 via INTRAVENOUS

## 2013-08-27 MED ORDER — PHENYLEPHRINE HCL 2.5 % OP SOLN
1.0000 [drp] | OPHTHALMIC | Status: AC
  Administered 2013-08-27: 1 [drp] via OPHTHALMIC

## 2013-08-27 MED ORDER — PHENYLEPHRINE HCL 2.5 % OP SOLN: 1.0000 [drp] | OPHTHALMIC | Status: DC

## 2013-08-27 MED ORDER — CYCLOPENTOLATE-PHENYLEPHRINE 0.2-1 % OP SOLN
1.0000 [drp] | OPHTHALMIC | Status: AC
  Administered 2013-08-27 (×2): 1 [drp] via OPHTHALMIC

## 2013-08-27 MED ORDER — TETRACAINE HCL 0.5 % OP SOLN
1.0000 [drp] | OPHTHALMIC | Status: AC
  Administered 2013-08-27 (×2): 1 [drp] via OPHTHALMIC

## 2013-08-27 MED ORDER — LIDOCAINE HCL (PF) 1 % IJ SOLN
INTRAMUSCULAR | Status: DC | PRN
  Administered 2013-08-27: .7 mL

## 2013-08-27 MED ORDER — PHENYLEPHRINE HCL 2.5 % OP SOLN
1.0000 [drp] | OPHTHALMIC | Status: AC
  Administered 2013-08-27 (×2): 1 [drp] via OPHTHALMIC

## 2013-08-27 MED ORDER — FENTANYL CITRATE 0.05 MG/ML IJ SOLN: 25.0000 ug | INTRAMUSCULAR | Status: DC | PRN

## 2013-08-27 MED ORDER — LACTATED RINGERS IV SOLN
INTRAVENOUS | Status: DC
  Administered 2013-08-27: 1000 mL via INTRAVENOUS

## 2013-08-27 MED ORDER — TETRACAINE HCL 0.5 % OP SOLN
1.0000 [drp] | Freq: Once | OPHTHALMIC | Status: AC
  Administered 2013-08-27: 1 [drp] via OPHTHALMIC

## 2013-08-27 MED ORDER — EPINEPHRINE HCL 1 MG/ML IJ SOLN
INTRAMUSCULAR | Status: AC
  Filled 2013-08-27: qty 1

## 2013-08-27 MED ORDER — MIDAZOLAM HCL 2 MG/2ML IJ SOLN
1.0000 mg | INTRAMUSCULAR | Status: DC | PRN
  Administered 2013-08-27: 2 mg via INTRAVENOUS

## 2013-08-27 MED ORDER — MIDAZOLAM HCL 2 MG/2ML IJ SOLN
INTRAMUSCULAR | Status: AC
  Filled 2013-08-27: qty 2

## 2013-08-27 MED ORDER — CYCLOPENTOLATE-PHENYLEPHRINE 0.2-1 % OP SOLN
1.0000 [drp] | OPHTHALMIC | Status: AC
  Administered 2013-08-27: 1 [drp] via OPHTHALMIC

## 2013-08-27 MED ORDER — POVIDONE-IODINE 5 % OP SOLN
OPHTHALMIC | Status: DC | PRN
  Administered 2013-08-27: 1 via OPHTHALMIC

## 2013-08-27 MED ORDER — BSS IO SOLN
INTRAOCULAR | Status: DC | PRN
  Administered 2013-08-27: 15 mL via INTRAOCULAR

## 2013-08-27 MED ORDER — CYCLOPENTOLATE-PHENYLEPHRINE 0.2-1 % OP SOLN: 1.0000 [drp] | OPHTHALMIC | Status: DC

## 2013-08-27 MED ORDER — ONDANSETRON HCL 4 MG/2ML IJ SOLN: 4.0000 mg | Freq: Once | INTRAMUSCULAR | Status: DC | PRN

## 2013-08-27 MED ORDER — PHENYLEPHRINE HCL 2.5 % OP SOLN
OPHTHALMIC | Status: AC
  Filled 2013-08-27: qty 15

## 2013-08-27 MED ORDER — LIDOCAINE HCL 3.5 % OP GEL: 1.0000 "application " | Freq: Once | OPHTHALMIC | Status: DC

## 2013-08-27 MED ORDER — LIDOCAINE HCL 3.5 % OP GEL
1.0000 "application " | Freq: Once | OPHTHALMIC | Status: AC
  Administered 2013-08-27: 1 via OPHTHALMIC

## 2013-08-27 MED ORDER — PROVISC 10 MG/ML IO SOLN
INTRAOCULAR | Status: DC | PRN
  Administered 2013-08-27: 8.5 mg via INTRAOCULAR

## 2013-08-27 MED ORDER — TETRACAINE HCL 0.5 % OP SOLN: 1.0000 [drp] | OPHTHALMIC | Status: DC

## 2013-08-27 SURGICAL SUPPLY — 32 items
CAPSULAR TENSION RING-AMO (OPHTHALMIC RELATED) IMPLANT
CLOTH BEACON ORANGE TIMEOUT ST (SAFETY) ×1 IMPLANT
EYE SHIELD UNIVERSAL CLEAR (GAUZE/BANDAGES/DRESSINGS) ×1 IMPLANT
GLOVE BIO SURGEON STRL SZ 6.5 (GLOVE) ×2 IMPLANT
GLOVE BIOGEL PI IND STRL 6.5 (GLOVE) IMPLANT
GLOVE BIOGEL PI IND STRL 7.0 (GLOVE) IMPLANT
GLOVE BIOGEL PI IND STRL 7.5 (GLOVE) IMPLANT
GLOVE BIOGEL PI INDICATOR 6.5 (GLOVE)
GLOVE BIOGEL PI INDICATOR 7.0 (GLOVE)
GLOVE BIOGEL PI INDICATOR 7.5 (GLOVE)
GLOVE ECLIPSE 6.5 STRL STRAW (GLOVE) IMPLANT
GLOVE ECLIPSE 7.0 STRL STRAW (GLOVE) IMPLANT
GLOVE ECLIPSE 7.5 STRL STRAW (GLOVE) IMPLANT
GLOVE EXAM NITRILE LRG STRL (GLOVE) IMPLANT
GLOVE EXAM NITRILE MD LF STRL (GLOVE) IMPLANT
GLOVE SKINSENSE NS SZ6.5 (GLOVE)
GLOVE SKINSENSE NS SZ7.0 (GLOVE)
GLOVE SKINSENSE STRL SZ6.5 (GLOVE) IMPLANT
GLOVE SKINSENSE STRL SZ7.0 (GLOVE) IMPLANT
KIT VITRECTOMY (OPHTHALMIC RELATED) IMPLANT
PAD ARMBOARD 7.5X6 YLW CONV (MISCELLANEOUS) ×1 IMPLANT
PROC W NO LENS (INTRAOCULAR LENS)
PROC W SPEC LENS (INTRAOCULAR LENS)
PROCESS W NO LENS (INTRAOCULAR LENS) IMPLANT
PROCESS W SPEC LENS (INTRAOCULAR LENS) IMPLANT
RING MALYGIN (MISCELLANEOUS) IMPLANT
SIGHTPATH CAT PROC W REG LENS (Ophthalmic Related) ×2 IMPLANT
SYR TB 1ML LL NO SAFETY (SYRINGE) ×1 IMPLANT
TAPE SURG TRANSPORE 1 IN (GAUZE/BANDAGES/DRESSINGS) IMPLANT
TAPE SURGICAL TRANSPORE 1 IN (GAUZE/BANDAGES/DRESSINGS) ×1
VISCOELASTIC ADDITIONAL (OPHTHALMIC RELATED) IMPLANT
WATER STERILE IRR 250ML POUR (IV SOLUTION) ×1 IMPLANT

## 2013-08-27 NOTE — Progress Notes (Signed)
Pt arrived to short stay stating that she is having her left eye done. Dr. Alto Denver aware, consents and orders changed to the left eye. Consents completed, all eye gtts given in left eye.

## 2013-08-27 NOTE — H&P (Signed)
  I have reviewed the H&P, the patient was re-examined, and I have identified no interval changes in medical condition and plan of care since the history and physical of record  The patient used her pre-op drops in the left eye so we have changed the site of surgery to the left eye today. She was originally scheduled for the right eye. Both eyes have visually significant cataracts.

## 2013-08-27 NOTE — Anesthesia Procedure Notes (Signed)
Procedure Name: MAC Date/Time: 08/27/2013 1:02 PM Performed by: Carolyne Littles, Tanasia Budzinski L Pre-anesthesia Checklist: Patient identified, Timeout performed, Emergency Drugs available, Suction available and Patient being monitored Oxygen Delivery Method: Nasal cannula

## 2013-08-27 NOTE — Preoperative (Signed)
Beta Blockers   Reason not to administer Beta Blockers:Not Applicable 

## 2013-08-27 NOTE — Anesthesia Preprocedure Evaluation (Signed)
Anesthesia Evaluation  Patient identified by MRN, date of birth, ID band Patient awake    Reviewed: Allergy & Precautions, H&P , NPO status , Patient's Chart, lab work & pertinent test results  History of Anesthesia Complications (+) PONV  Airway Mallampati: III TM Distance: >3 FB Neck ROM: Full  Mouth opening: Limited Mouth Opening  Dental  (+) Teeth Intact   Pulmonary shortness of breath, asthma ,  breath sounds clear to auscultation        Cardiovascular hypertension, Pt. on medications Rhythm:Regular Rate:Normal     Neuro/Psych    GI/Hepatic hiatal hernia, GERD-  ,  Endo/Other  diabetes, Type 2, Oral Hypoglycemic Agents  Renal/GU      Musculoskeletal   Abdominal   Peds  Hematology   Anesthesia Other Findings   Reproductive/Obstetrics                           Anesthesia Physical Anesthesia Plan  ASA: III  Anesthesia Plan: MAC   Post-op Pain Management:    Induction: Intravenous  Airway Management Planned: Nasal Cannula  Additional Equipment:   Intra-op Plan:   Post-operative Plan:   Informed Consent: I have reviewed the patients History and Physical, chart, labs and discussed the procedure including the risks, benefits and alternatives for the proposed anesthesia with the patient or authorized representative who has indicated his/her understanding and acceptance.     Plan Discussed with:   Anesthesia Plan Comments:         Anesthesia Quick Evaluation

## 2013-08-27 NOTE — Op Note (Signed)
Date of Admission: 08/27/2013  Date of Surgery: 08/27/2013  Pre-Op Dx: Cataract  Left  Eye  Post-Op Dx: Cataract  Left  Eye,  Dx Code 366.19  Surgeon: Gemma Payor, M.D.  Assistants: None  Anesthesia: Topical with MAC  Indications: Painless, progressive loss of vision with compromise of daily activities.  Surgery: Cataract Extraction with Intraocular lens Implant Left Eye  Discription: The patient had dilating drops and viscous lidocaine placed into the left eye in the pre-op holding area. After transfer to the operating room, a time out was performed. The patient was then prepped and draped. Beginning with a 75 degree blade a paracentesis port was made at the surgeon's 2 o'clock position. The anterior chamber was then filled with 1% non-preserved lidocaine. This was followed by filling the anterior chamber with Provisc. A 2.60mm keratome blade was used to make a clear corneal incision at the temporal limbus. A bent cystatome needle was used to create a continuous tear capsulotomy. Hydrodissection was performed with balanced salt solution on a Fine canula. The lens nucleus was then removed using the phacoemulsification handpiece. Residual cortex was removed with the I&A handpiece. The anterior chamber and capsular bag were refilled with Provisc. A posterior chamber intraocular lens was placed into the capsular bag with it's injector. The implant was positioned with the Kuglan hook. The Provisc was then removed from the anterior chamber and capsular bag with the I&A handpiece. Stromal hydration of the main incision and paracentesis port was performed with BSS on a Fine canula. The wounds were tested for leak which was negative. The patient tolerated the procedure well. There were no operative complications. The patient was then transferred to the recovery room in stable condition.  Complications: None  Specimen: None  EBL: None  Prosthetic device: B&L enVista, MX60, power 20.0D, SN [id number].

## 2013-08-27 NOTE — Anesthesia Postprocedure Evaluation (Signed)
  Anesthesia Post-op Note  Patient: Ann Fuller  Procedure(s) Performed: Procedure(s) with comments: CATARACT EXTRACTION PHACO AND INTRAOCULAR LENS PLACEMENT (IOC) (Left) - CDE 4.25  Patient Location: Short Stay  Anesthesia Type:MAC  Level of Consciousness: awake, alert , oriented and patient cooperative  Airway and Oxygen Therapy: Patient Spontanous Breathing  Post-op Pain: none  Post-op Assessment: Post-op Vital signs reviewed, Patient's Cardiovascular Status Stable, Respiratory Function Stable, Patent Airway, No signs of Nausea or vomiting and Pain level controlled  Post-op Vital Signs: Reviewed and stable  Complications: No apparent anesthesia complications

## 2013-08-27 NOTE — Transfer of Care (Signed)
Immediate Anesthesia Transfer of Care Note  Patient: Ann Fuller  Procedure(s) Performed: Procedure(s) with comments: CATARACT EXTRACTION PHACO AND INTRAOCULAR LENS PLACEMENT (IOC) (Left) - CDE 4.25  Patient Location: Short Stay  Anesthesia Type:MAC  Level of Consciousness: awake, alert , oriented and patient cooperative  Airway & Oxygen Therapy: Patient Spontanous Breathing  Post-op Assessment: Report given to PACU RN and Post -op Vital signs reviewed and stable  Post vital signs: Reviewed and stable  Complications: No apparent anesthesia complications

## 2013-08-28 ENCOUNTER — Encounter (HOSPITAL_COMMUNITY): Payer: Self-pay | Admitting: Ophthalmology

## 2013-08-28 LAB — GLUCOSE, CAPILLARY: Glucose-Capillary: 139 mg/dL — ABNORMAL HIGH (ref 70–99)

## 2013-08-31 ENCOUNTER — Ambulatory Visit (HOSPITAL_COMMUNITY): Payer: Medicare Other | Admitting: Physical Therapy

## 2013-09-07 ENCOUNTER — Ambulatory Visit (HOSPITAL_COMMUNITY)
Admission: RE | Admit: 2013-09-07 | Discharge: 2013-09-07 | Disposition: A | Discharge status: Home / Self Care | Payer: BC Managed Care – PPO | Source: Ambulatory Visit | Attending: Physical Medicine and Rehabilitation | Admitting: Physical Medicine and Rehabilitation

## 2013-09-07 DIAGNOSIS — IMO0001 Reserved for inherently not codable concepts without codable children: Secondary | ICD-10-CM | POA: Insufficient documentation

## 2013-09-07 DIAGNOSIS — M542 Cervicalgia: Secondary | ICD-10-CM | POA: Insufficient documentation

## 2013-09-07 DIAGNOSIS — E119 Type 2 diabetes mellitus without complications: Secondary | ICD-10-CM | POA: Insufficient documentation

## 2013-09-07 DIAGNOSIS — I10 Essential (primary) hypertension: Secondary | ICD-10-CM | POA: Insufficient documentation

## 2013-09-07 NOTE — Evaluation (Addendum)
Physical Therapy Evaluation  Patient Details  Name: Ann Fuller MRN: [id number] Date of Birth: 1946/10/19  Today's Date: 09/07/2013 Time: 1530-1600 PT Time Calculation (min): 30 min Charges: 1 evaluation             Visit#: 1 of 8  Re-eval: 10/07/13 Assessment Diagnosis: Cervical radiculopathy Surgical Date: 03/24/13 Next MD Visit: Dr. Chong Sicilian - 09/17/13  Authorization: [facility] Medicare    Authorization Time Period:    Authorization Visit#: 1 of 10   Past Medical History:  Past Medical History  Diagnosis Date  . HTN (hypertension)   . Diabetes mellitus   . Adenomatous colon polyp   . Diverticulitis   . Internal hemorrhoid   . IBS (irritable bowel syndrome)   . GERD (gastroesophageal reflux disease)   . Dyspepsia   . Hiatal hernia   . Tubular adenoma 11/2010  . Bulging lumbar disc   . PONV (postoperative nausea and vomiting)   . Asthma   . Shortness of breath   . Arthritis   . Breast cancer [id number]   Past Surgical History:  Past Surgical History  Procedure Laterality Date  . Lower back  2007  . Cholecystectomy    . Tubal ligation    . Left mastectomy    . Esophagogastroduodenoscopy  12/13/2010    Dr. Dot Been hernia, fundal gland type polyps  . Colonoscopy  12/13/2010    Dr. Houston Siren pancolonic diverticular.tubular adenoma  . Colonoscopy  07/13/2003  . Appendectomy    . Mastectomy  1995  . Back surgery  2007    Lumbar fusion  . Tonsillectomy    . Abdominal hysterectomy    . Cataract extraction w/phaco Left 08/27/2013    Procedure: CATARACT EXTRACTION PHACO AND INTRAOCULAR LENS PLACEMENT (IOC);  Surgeon: Gemma Payor, MD;  Location: [facility];  Service: Ophthalmology;  Laterality: Left;  CDE 4.25   Subjective Symptoms/Limitations Symptoms: Pt is a right handed 67 year old female referred to PT for cervical radiculopathy to her RUE which started in April.  She reports that she has burning pain to her Rt shoulder and between the shoulder blades.  She  has recieved an injection about 2.5 weeks ago to the middle of her shoulder blades and seemed to help the pain to her shoulders,  continues to have burning and sharp pain to her scapula and shoulder region.  She was using 2 pillows to sleep on and has reduced down to one and helps the pain.  Her pain wakes her up around 4 am every morning and she has difficulty going back to sleep.  X-rays releived OA of the neck and 4 HNP to cervical spine. She reports that she used to do exercises in the water that helped to reduce her pain.  She had to stop due to illness. Pertinent History: Lt shoulder RTC injury (reports she went to another OP PT clinic and received mechanical traction to her neck and help with her shoulder pain) and back pain, hx of breast cancer (Lt side) How long can you stand comfortably?: 15 minutes  Patient Stated Goals: relieve the pain Pain Assessment Currently in Pain?: Yes Pain Score: 8  Pain Location: Shoulder (shooting and buring pain. ) Pain Type: Chronic pain;Acute pain;Neuropathic pain Pain Relieving Factors: Ice and heat, hydrocodone and muscle relaxor Effect of Pain on Daily Activities: difficulty laying down, riding in the car longer than an hour  Precautions/Restrictions  Precautions Precaution Comments: Hx of Breast Cancer  Balance Screening Balance Screen Has the patient  fallen in the past 6 months: No Has the patient had a decrease in activity level because of a fear of falling? : Yes Is the patient reluctant to leave their home because of a fear of falling? : No  Prior Functioning  Prior Function Vocation: Retired Comments: enjoys traveling  Cognition/Observation Observation/Other Assessments Observations: swelling to clavicular region   Sensation/Coordination/Flexibility/Functional Tests Coordination Gross Motor Movements are Fluid and Coordinated: No Coordination and Movement Description: impaired to scalene musculature and anterior cervical musculature  to chin tuck; scapular dyskinesis Functional Tests Functional Tests: Neck Disability Index (NDI):  Functional Tests: + Distraction Test, + Spurlings Compression Test, -  Rt Hawkins-Kennedy  Assessment RUE AROM (degrees) Right Shoulder Extension: 10 Degrees Right Shoulder Internal Rotation:  (IR w/extension to top of gluteal fold) RUE Strength RUE Overall Strength Comments: mid trap: 2+/5, low trap: 3/5 Right Shoulder Flexion: 3+/5 (painful) Right Shoulder Extension: 3+/5 (painful) Right Shoulder ABduction: 3+/5 (painful) Right Shoulder Internal Rotation: 3/5 Right Shoulder External Rotation: 3+/5 Cervical AROM Cervical Flexion: WNL Cervical Extension: WNL Cervical - Right Side Bend: 12 cm - pain Cervical - Left Side Bend: 12 cm  Cervical - Right Rotation: 14cm - pain Cervical - Left Rotation: 12 cm Cervical Strength Overall Cervical Strength Comments: capital flexion: 3/5 Cervical Flexion: 4/5 Cervical Extension: 4/5 Cervical - Right Side Bend: 4/5 Cervical - Left Side Bend: 4/5 Cervical - Right Rotation: 4/5 Cervical - Left Rotation: 4/5 Palpation Palpation: pain and tendenress with notable atrophy to Rt UT, 1st Rib, rhomboid region,   Mobility/Balance  Posture/Postural Control Posture/Postural Control: Postural limitations Postural Limitations: upper cross syndrome, lower cross syndrome   Exercise/Treatments Stretches Upper Trapezius Stretch: 1 rep;20 seconds Levator Stretch: 1 rep;20 seconds Seated Exercises Neck Retraction: 5 reps Shoulder Rolls: 10 reps;Backwards Postural Training: education and pt deomstration  Physical Therapy Assessment and Plan PT Assessment and Plan Clinical Impression Statement: Pt is a 67 year old female referred to PT secondary to cervical radiculapathy to her RUE with impairments listed below.  At this time main conern is of swelling to her supraclavicular area which may be due to increased lymphedema from hx of Lt sided breast cancer.   Asked pt to follow up with MD tomorrow to discuss other possibilities.  At this time she is most limited by her signiificantly impaired postural awareness and coordianted movements and difficulty with Rt shoulder IR.  Pt will benefit from skilled therapeutic intervention in order to improve on the following deficits: Decreased strength;Pain;Decreased coordination;Impaired perceived functional ability;Increased muscle spasms;Decreased range of motion;Increased fascial restricitons;Improper body mechanics Rehab Potential: Fair PT Frequency: Min 2X/week PT Duration: 8 weeks PT Treatment/Interventions: Therapeutic activities;Therapeutic exercise;Neuromuscular re-education;Patient/family education;Manual techniques;Modalities PT Plan: Shoulder AAROM using dowel: flexion and abduction, supine ER/IR, theraball exercises.  POstural re-education: chin tucks, x-v, w-backs, UT stretch, levator stretch    Goals Home Exercise Program Pt/caregiver will Perform Home Exercise Program: Independently PT Goal: Perform Home Exercise Program - Progress: Goal set today PT Short Term Goals Time to Complete Short Term Goals: 3 weeks PT Short Term Goal 1: Pt will report pain to her RUE less than 5/10during the night time hours and a 50% reducation in radicular symptoms.  PT Short Term Goal 2: Pt will improve her cervical and scapular stabalizers by 1 muscle grade for greater ease with brushing her hair.  PT Short Term Goal 3: Pt will improve her Rt shoulder IR and be able to place it behind her back to waist level in order to complete  necessary ADL's.  PT Short Term Goal 4: Pt will be educated on proper lymphedema massage to cervical region.  PT Long Term Goals Time to Complete Long Term Goals: 8 weeks PT Long Term Goal 1: Pt will improve her cervical and scapula stabalizer strength to [facility] in order to sit with approprriate posture x10 minutes in order to decrease radicular symptoms to her RUE, in order to progress with  greater ease of sitting in a car.   PT Long Term Goal 2: Pt will improve her DASH to less than 40% for improved percieved functional ability.  Long Term Goal 3: Pt will improve her cervical AROM for greater ease with turning her head while driving in a car.   Problem List Patient Active Problem List   Diagnosis Date Noted  . Cervical radiculopathy 09/08/2013  . Weakness of right upper extremity 09/08/2013  . Moderate persistent asthma do to atopic features 05/02/2012  . GERD (gastroesophageal reflux disease) 11/23/2011  . Irritable bowel syndrome 09/03/2011  . DIARRHEA 01/25/2011  . ADENOMATOUS COLONIC POLYP 11/21/2010  . CONSTIPATION 11/21/2010  . ROTATOR CUFF SYNDROME, LEFT 11/21/2010  . ABDOMINAL PAIN 11/21/2010  . CARCINOMA, BREAST, HX OF 11/21/2010  . SHOULDER, ARTHRITIS, DEGEN./OSTEO 03/30/2010  . RUPTURE ROTATOR CUFF 03/30/2010    PT - End of Session Activity Tolerance: Patient tolerated treatment well PT Plan of Care PT Home Exercise Plan: given PT Patient Instructions: importance of posture, HEP and discussed POC Consulted and Agree with Plan of Care: Patient  GP Functional Limitation: Carrying, moving and handling objects Carrying, Moving and Handling Objects Current Status (W0981): At least 60 percent but less than 80 percent impaired, limited or restricted Carrying, Moving and Handling Objects Goal Status [phone]): At least 40 percent but less than 60 percent impaired, limited or restricted  Kentravious Lipford, MPT, ATC 09/07/2013, 6:38 PM  Physician Documentation Your signature is required to indicate approval of the treatment plan as stated above.  Please sign and either send electronically or make a copy of this report for your files and return this physician signed original.   Please mark one 1.__approve of plan  2. ___approve of plan with the following conditions.   ______________________________                                                           _____________________ Physician Signature                                                                                                             Date

## 2013-09-08 DIAGNOSIS — R29898 Other symptoms and signs involving the musculoskeletal system: Secondary | ICD-10-CM | POA: Insufficient documentation

## 2013-09-08 DIAGNOSIS — M5412 Radiculopathy, cervical region: Secondary | ICD-10-CM | POA: Insufficient documentation

## 2013-09-09 ENCOUNTER — Encounter (HOSPITAL_COMMUNITY): Payer: Self-pay | Admitting: Pharmacy Technician

## 2013-09-14 ENCOUNTER — Ambulatory Visit (HOSPITAL_COMMUNITY)
Admission: RE | Admit: 2013-09-14 | Discharge: 2013-09-14 | Disposition: A | Discharge status: Home / Self Care | Payer: BC Managed Care – PPO | Source: Ambulatory Visit | Attending: Internal Medicine | Admitting: Internal Medicine

## 2013-09-14 NOTE — Progress Notes (Signed)
Physical Therapy Treatment Patient Details  Name: Ann Fuller MRN: [id number] Date of Birth: 1946-11-21  Today's Date: 09/14/2013 Time: 0935-1021 PT Time Calculation (min): 46 min Charges: Manual: [id number] TE: [phone] Ice: 1011-1021 Visit#: 2 of 8  Re-eval:      Authorization: [employer] Medicare  Authorization Time Period:    Authorization Visit#: 2 of 10   Subjective: Symptoms/Limitations Symptoms: Reports she went to MD to discuss swelling to front of neck. MD r/o lung cancer.  Reports most pain continues to wake her at night (4am).  She reports she has tried her HEP.  Pain Assessment Currently in Pain?: No/denies  Precautions/Restrictions     Exercise/Treatments Stretches Upper Trapezius Stretch: 2 reps;30 seconds (BUE) Levator Stretch: 2 reps;30 seconds (BUE) Seated Exercises Shoulder Rolls: 20 reps;Backwards Shoulder Rolls Limitations: PT faciliation Supine Exercises Neck Retraction: 5 reps;5 secs;Limitations Neck Retraction Limitations: tacile and verbal cuein Capital Flexion: 5 reps;3 secs;Limitations Capital Flexion Limitations: Min A Supine External Rotation: Both;10 reps;AROM Internal Rotation: AROM;5 reps;10 reps Flexion: Left;10 reps;Weights Shoulder Flexion Weight (lbs): 1lb dowel rod ROM / Strengthening / Isometric Strengthening UBE (Upper Arm Bike): 4 min backwards X to V Arms: supine 10 reps w/PT faciliation    Modalities Modalities: Cryotherapy Manual Therapy Manual Therapy: Joint mobilization Joint Mobilization: Grade I-II PA to C3-T3 to improve mobility Myofascial Release: To cervical region to decrease fascial restrictions  Other Manual Therapy: Manual cervical traction 4x30 sec holds Cryotherapy Cryotherapy Location: Neck Type of Cryotherapy: Ice pack  Physical Therapy Assessment and Plan PT Assessment and Plan Clinical Impression Statement: Pt has significant atrophy to Rt UT, scapular and cervical region compared to LUE.  Pt  require mod cueing and facilitation  with min A for proper cervical musculature activation in supine position. Had 25% decrease in fascial restrictions and had normal mobility after joint mobs.  Is still greatly limited in her Rt cervical lateral flexion to Rt sided pain. Educated pt on proper posture and lymph massage to Rt cervical region.  PT Plan: Shoulder AAROM using dowel: flexion and abduction, supine ER/IR, theraball exercises.  POstural re-education: w-backs,     Goals Home Exercise Program Pt/caregiver will Perform Home Exercise Program: Independently PT Goal: Perform Home Exercise Program - Progress: Progressing toward goal PT Short Term Goals Time to Complete Short Term Goals: 3 weeks PT Short Term Goal 1: Pt will report pain to her RUE less than 5/10during the night time hours and a 50% reducation in radicular symptoms.  PT Short Term Goal 1 - Progress: Progressing toward goal PT Short Term Goal 2: Pt will improve her cervical and scapular stabalizers by 1 muscle grade for greater ease with brushing her hair.  PT Short Term Goal 2 - Progress: Progressing toward goal PT Short Term Goal 3: Pt will improve her Rt shoulder IR and be able to place it behind her back to waist level in order to complete necessary ADL's.  PT Short Term Goal 3 - Progress: Progressing toward goal PT Short Term Goal 4: Pt will be educated on proper lymphedema massage to cervical region.  PT Short Term Goal 4 - Progress: Progressing toward goal PT Long Term Goals Time to Complete Long Term Goals: 8 weeks PT Long Term Goal 1: Pt will improve her cervical and scapula stabalizer strength to [facility] in order to sit with approprriate posture x10 minutes in order to decrease radicular symptoms to her RUE,  in order to progress with greater ease of sitting in a car.  PT Long Term Goal 2: Pt will improve her DASH to less than 40% for improved percieved functional ability.  Long Term Goal 3: Pt will improve her cervical  AROM for greater ease with turning her head while driving in a car.   Problem List Patient Active Problem List   Diagnosis Date Noted  . Cervical radiculopathy 09/08/2013  . Weakness of right upper extremity 09/08/2013  . Moderate persistent asthma do to atopic features 05/02/2012  . GERD (gastroesophageal reflux disease) 11/23/2011  . Irritable bowel syndrome 09/03/2011  . DIARRHEA 01/25/2011  . ADENOMATOUS COLONIC POLYP 11/21/2010  . CONSTIPATION 11/21/2010  . ROTATOR CUFF SYNDROME, LEFT 11/21/2010  . ABDOMINAL PAIN 11/21/2010  . CARCINOMA, BREAST, HX OF 11/21/2010  . SHOULDER, ARTHRITIS, DEGEN./OSTEO 03/30/2010  . RUPTURE ROTATOR CUFF 03/30/2010    PT Plan of Care PT Patient Instructions: importance of posture, HEP and discussed POC Consulted and Agree with Plan of Care: Patient  GP    Cordaryl Decelles, MPT, ATC 09/14/2013, 10:18 AM

## 2013-09-15 NOTE — Patient Instructions (Addendum)
Your procedure is scheduled on:  09/24/2013  Report to [facility] at 6:15      AM.  Call this number if you have problems the morning of surgery: [phone]   Remember:   Do not eat or drink :After Midnight.    Take these medicines the morning of surgery with A SIP OF WATER: Amlodopine, coreg, Dexilant, Cozaar, and use albuterol and Dulera inhalers, No insulin morning of surgery   Do not wear jewelry, make-up or nail polish.  Do not wear lotions, powders, or perfumes. You may wear deodorant.  Do not shave 48 hours prior to surgery.  Do not bring valuables to the hospital.  Contacts, dentures or bridgework may not be worn into surgery.  Patients discharged the day of surgery will not be allowed to drive home.  Name and phone number of your driver:    Please read over the following fact sheets that you were given: Pain Booklet, Surgical Site Infection Prevention, Anesthesia Post-op Instructions and Care and Recovery After Surgery  Cataract Surgery  A cataract is a clouding of the lens of the eye. When a lens becomes cloudy, vision is reduced based on the degree and nature of the clouding. Surgery may be needed to improve vision. Surgery removes the cloudy lens and usually replaces it with a substitute lens (intraocular lens, IOL). LET YOUR EYE DOCTOR KNOW ABOUT:  Allergies to food or medicine.   Medicines taken including herbs, eyedrops, over-the-counter medicines, and creams.   Use of steroids (by mouth or creams).   Previous problems with anesthetics or numbing medicine.   History of bleeding problems or blood clots.   Previous surgery.   Other health problems, including diabetes and kidney problems.   Possibility of pregnancy, if this applies.  RISKS AND COMPLICATIONS  Infection.   Inflammation of the eyeball (endophthalmitis) that can spread to both eyes (sympathetic ophthalmia).   Poor wound healing.   If an IOL is inserted, it can later fall out of proper position.  This is very uncommon.   Clouding of the part of your eye that holds an IOL in place. This is called an "after-cataract." These are uncommon, but easily treated.  BEFORE THE PROCEDURE  Do not eat or drink anything except small amounts of water for 8 to 12 before your surgery, or as directed by your caregiver.   Unless you are told otherwise, continue any eyedrops you have been prescribed.   Talk to your primary caregiver about all other medicines that you take (both prescription and non-prescription). In some cases, you may need to stop or change medicines near the time of your surgery. This is most important if you are taking blood-thinning medicine.Do not stop medicines unless you are told to do so.   Arrange for someone to drive you to and from the procedure.   Do not put contact lenses in either eye on the day of your surgery.  PROCEDURE There is more than one method for safely removing a cataract. Your doctor can explain the differences and help determine which is best for you. Phacoemulsification surgery is the most common form of cataract surgery.  An injection is given behind the eye or eyedrops are given to make this a painless procedure.   A small cut (incision) is made on the edge of the clear, dome-shaped surface that covers the front of the eye (cornea).   A tiny probe is painlessly inserted into the eye. This device gives off ultrasound waves that  soften and break up the cloudy center of the lens. This makes it easier for the cloudy lens to be removed by suction.   An IOL may be implanted.   The normal lens of the eye is covered by a clear capsule. Part of that capsule is intentionally left in the eye to support the IOL.   Your surgeon may or may not use stitches to close the incision.  There are other forms of cataract surgery that require a larger incision and stiches to close the eye. This approach is taken in cases where the doctor feels that the cataract cannot be  easily removed using phacoemulsification. AFTER THE PROCEDURE  When an IOL is implanted, it does not need care. It becomes a permanent part of your eye and cannot be seen or felt.   Your doctor will schedule follow-up exams to check on your progress.   Review your other medicines with your doctor to see which can be resumed after surgery.   Use eyedrops or take medicine as prescribed by your doctor.  Document Released: 11/29/2011 Document Reviewed: 11/26/2011 [facility] Patient Information 2012 [place], [place].  .Cataract Surgery Care After Refer to this sheet in the next few weeks. These instructions provide you with information on caring for yourself after your procedure. Your caregiver may also give you more specific instructions. Your treatment has been planned according to current medical practices, but problems sometimes occur. Call your caregiver if you have any problems or questions after your procedure.  HOME CARE INSTRUCTIONS   Avoid strenuous activities as directed by your caregiver.   Ask your caregiver when you can resume driving.   Use eyedrops or other medicines to help healing and control pressure inside your eye as directed by your caregiver.   Only take over-the-counter or prescription medicines for pain, discomfort, or fever as directed by your caregiver.   Do not to touch or rub your eyes.   You may be instructed to use a protective shield during the first few days and nights after surgery. If not, wear sunglasses to protect your eyes. This is to protect the eye from pressure or from being accidentally bumped.   Keep the area around your eye clean and dry. Avoid swimming or allowing water to hit you directly in the face while showering. Keep soap and shampoo out of your eyes.   Do not bend or lift heavy objects. Bending increases pressure in the eye. You can walk, climb stairs, and do light household chores.   Do not put a contact lens into the eye that had surgery  until your caregiver says it is okay to do so.   Ask your doctor when you can return to work. This will depend on the kind of work that you do. If you work in a dusty environment, you may be advised to wear protective eyewear for a period of time.   Ask your caregiver when it will be safe to engage in sexual activity.   Continue with your regular eye exams as directed by your caregiver.  What to expect:  It is normal to feel itching and mild discomfort for a few days after cataract surgery. Some fluid discharge is also common, and your eye may be sensitive to light and touch.   After 1 to 2 days, even moderate discomfort should disappear. In most cases, healing will take about 6 weeks.   If you received an intraocular lens (IOL), you may notice that colors are very bright or  have a blue tinge. Also, if you have been in bright sunlight, everything may appear reddish for a few hours. If you see these color tinges, it is because your lens is clear and no longer cloudy. Within a few months after receiving an IOL, these extra colors should go away. When you have healed, you will probably need new glasses.  SEEK MEDICAL CARE IF:   You have increased bruising around your eye.   You have discomfort not helped by medicine.  SEEK IMMEDIATE MEDICAL CARE IF:   You have a fever.   You have a worsening or sudden vision loss.   You have redness, swelling, or increasing pain in the eye.   You have a thick discharge from the eye that had surgery.  MAKE SURE YOU:  Understand these instructions.   Will watch your condition.   Will get help right away if you are not doing well or get worse.  Document Released: 06/29/2005 Document Revised: 11/29/2011 Document Reviewed: 08/03/2011 [facility] Patient Information 2012 [place], [place].

## 2013-09-16 ENCOUNTER — Ambulatory Visit (HOSPITAL_COMMUNITY)
Admission: RE | Admit: 2013-09-16 | Discharge: 2013-09-16 | Disposition: A | Discharge status: Home / Self Care | Payer: BC Managed Care – PPO | Source: Ambulatory Visit | Attending: Internal Medicine | Admitting: Internal Medicine

## 2013-09-16 ENCOUNTER — Encounter (HOSPITAL_COMMUNITY)
Admission: RE | Admit: 2013-09-16 | Discharge: 2013-09-16 | Disposition: A | Discharge status: Home / Self Care | Payer: BC Managed Care – PPO | Source: Ambulatory Visit | Attending: Ophthalmology | Admitting: Ophthalmology

## 2013-09-16 ENCOUNTER — Encounter (HOSPITAL_COMMUNITY): Payer: Self-pay

## 2013-09-16 DIAGNOSIS — R29898 Other symptoms and signs involving the musculoskeletal system: Secondary | ICD-10-CM

## 2013-09-16 DIAGNOSIS — M5412 Radiculopathy, cervical region: Secondary | ICD-10-CM

## 2013-09-16 HISTORY — DX: Pain in right shoulder: M25.511

## 2013-09-16 NOTE — Progress Notes (Signed)
Physical Therapy Treatment Patient Details  Name: Ann Fuller MRN: [id number] Date of Birth: 03/29/1946  Today's Date: 09/16/2013 Time: 0932-1027 PT Time Calculation (min): 55 min Charges: TE: [id number] Manual: 1004-1017 Ice: 1017-1027 Visit#: 3 of 8  Re-eval:      Authorization: [employer] Medicare  Authorization Time Period:    Authorization Visit#: 3 of 10   Subjective: Symptoms/Limitations Symptoms: Pt reports that she is still waking up at night with pain (7/10) currently it is not hurting her.  She reports she did not have increased pain after treatment last session.  Pain Assessment Currently in Pain?: No/denies  Precautions/Restrictions     Exercise/Treatments Theraband Exercises Scapula Retraction: 10 reps;Red Shoulder Extension: 10 reps;Red Rows: 10 reps;Red Standing Exercises Upper Extremity Flexion with Stabilization: Flexion;10 reps;Limitations UE Flexion with Stabilization Limitations: VC, TC to BUE w/PT Faciliation Other Standing Exercises: Corner Elbow Presses 5x10 sec holds Seated Exercises W Back: 10 reps;Limitations W Back Limitations: visual cueing Shoulder Rolls: 20 reps;Backwards Upper Extremity D2: Flexion;10 reps;Limitations UE D2 Limitations: PT faciliation Other Seated Exercise: Shoulder Extenison 15 reps Supine External Rotation: Both;10 reps Internal Rotation: Both;10 reps Other Supine Exercises: Star Gazers x15 reps w/focus on internal rotation: PT faciliation  ROM / Strengthening / Isometric Strengthening UBE (Upper Arm Bike): 5 min backwards 1.0  Modalities Modalities: Cryotherapy Manual Therapy Manual Therapy: Joint mobilization Myofascial Release: to Rt UT and subscapularis to decrease fascial restriction Other Manual Therapy: strain counter strain (SCS) to Rt UT w/trigger point release.  PROM to improve shoulder IR after Cryotherapy Cryotherapy Location: Neck  Physical Therapy Assessment and Plan PT Assessment and  Plan Clinical Impression Statement: tx focsed on improving postural awareness and strength.  Has notatble difficulty with shoulder IR coordination, strength and ROM.  Requires moderate PT faciliation and multimodal cueing for UE activities and posture. Utlized manual techniques to decrease spasms by 25% to subscapularis muscle and by 100% to superficial UT region, continues to have significant trigger point with deep palpation.  Pt has improved IR AROM after manual technique.  Ice applied at end to decrease inflammation for pain control PT Plan: Continue with focus on improve postural strength and mechanics.  Add therball exercises for cervical and Rt shoulder IR ROM.  Add prone on elbows    Goals Home Exercise Program Pt/caregiver will Perform Home Exercise Program: Independently PT Short Term Goals Time to Complete Short Term Goals: 3 weeks PT Short Term Goal 1: Pt will report pain to her RUE less than 5/10during the night time hours and a 50% reducation in radicular symptoms.  PT Short Term Goal 1 - Progress: Progressing toward goal PT Short Term Goal 2: Pt will improve her cervical and scapular stabalizers by 1 muscle grade for greater ease with brushing her hair.  PT Short Term Goal 2 - Progress: Progressing toward goal PT Short Term Goal 3: Pt will improve her Rt shoulder IR and be able to place it behind her back to waist level in order to complete necessary ADL's.  PT Short Term Goal 3 - Progress: Progressing toward goal PT Short Term Goal 4: Pt will be educated on proper lymphedema massage to cervical region.  PT Short Term Goal 4 - Progress: Met PT Long Term Goals Time to Complete Long Term Goals: 8 weeks PT Long Term Goal 1: Pt will improve her cervical and scapula stabalizer strength to [facility] in order to sit with approprriate posture x10 minutes in order to decrease radicular symptoms to her RUE,  in  order to progress with greater ease of sitting in a car.   PT Long Term Goal 2: Pt will  improve her DASH to less than 40% for improved percieved functional ability.  Long Term Goal 3: Pt will improve her cervical AROM for greater ease with turning her head while driving in a car.   Problem List Patient Active Problem List   Diagnosis Date Noted  . Cervical radiculopathy 09/08/2013  . Weakness of right upper extremity 09/08/2013  . Moderate persistent asthma do to atopic features 05/02/2012  . GERD (gastroesophageal reflux disease) 11/23/2011  . Irritable bowel syndrome 09/03/2011  . DIARRHEA 01/25/2011  . ADENOMATOUS COLONIC POLYP 11/21/2010  . CONSTIPATION 11/21/2010  . ROTATOR CUFF SYNDROME, LEFT 11/21/2010  . ABDOMINAL PAIN 11/21/2010  . CARCINOMA, BREAST, HX OF 11/21/2010  . SHOULDER, ARTHRITIS, DEGEN./OSTEO 03/30/2010  . RUPTURE ROTATOR CUFF 03/30/2010    PT Plan of Care PT Patient Instructions: importance of posture, HEP and discussed POC Consulted and Agree with Plan of Care: Patient  GP    Brandy Kabat, MPT, ATC 09/16/2013, 10:25 AM

## 2013-09-21 ENCOUNTER — Ambulatory Visit (HOSPITAL_COMMUNITY)
Admission: RE | Admit: 2013-09-21 | Discharge: 2013-09-21 | Disposition: A | Discharge status: Home / Self Care | Payer: BC Managed Care – PPO | Source: Ambulatory Visit | Attending: Internal Medicine | Admitting: Internal Medicine

## 2013-09-21 DIAGNOSIS — R29898 Other symptoms and signs involving the musculoskeletal system: Secondary | ICD-10-CM

## 2013-09-21 DIAGNOSIS — M5412 Radiculopathy, cervical region: Secondary | ICD-10-CM

## 2013-09-21 NOTE — Progress Notes (Signed)
Physical Therapy Treatment Patient Details  Name: Ann Fuller MRN: [id number] Date of Birth: 1946-10-16  Today's Date: 09/21/2013 Time: 6962-9528 PT Time Calculation (min): 43 min Charge: TE 4132-4401  Visit#: 4 of 8  Re-eval: 10/07/13 Assessment Diagnosis: Cervical radiculopathy Surgical Date: 03/24/13 Next MD Visit: Dr. Chong Sicilian -   Authorization: [facility] Medicare  Authorization Time Period:    Authorization Visit#: 4 of 10   Subjective: Symptoms/Limitations Symptoms: Pt stated pain free today,   Pain Assessment Currently in Pain?: No/denies  Objective:   Exercise/Treatments Stretches Upper Trapezius Stretch: 2 reps;30 seconds Levator Stretch: 2 reps;30 seconds Theraband Exercises Scapula Retraction: 10 reps;Red Shoulder Extension: 10 reps;Red Rows: 10 reps;Red Standing Exercises Other Standing Exercises: Corner Elbow Presses 5x10 sec holds Seated Exercises Neck Retraction: 10 reps  Supine External Rotation: Both;10 reps Internal Rotation: Both;10 reps Other Supine Exercises: Star Gazers x15 reps w/focus on internal rotation: PT faciliation  Therapy Ball Flexion: 10 reps ABduction: 10 reps;Limitations ABduction Limitations: Rt UE Right/Left: 5 reps ROM / Strengthening / Isometric Strengthening UBE (Upper Arm Bike): 5 min backwards 2.0    Manual Therapy Other Manual Therapy: PROM to improve shoulder IR  Physical Therapy Assessment and Plan PT Assessment and Plan Clinical Impression Statement: Began theraball exercises to improve AROM with Rt shoulder.  Reviewed HEP and stretches to assure correct form and technique.  Continued with postural strengthening exercises and began corner stretch to improve flexibilty.  Pt reported no increase in pain through session, encouraged pt to apply ice later for pain and inflammation control.   PT Plan: Continue with focus on improve postural strength and mechanics.   Add prone on elbows    Goals    Problem  List Patient Active Problem List   Diagnosis Date Noted  . Cervical radiculopathy 09/08/2013  . Weakness of right upper extremity 09/08/2013  . Moderate persistent asthma do to atopic features 05/02/2012  . GERD (gastroesophageal reflux disease) 11/23/2011  . Irritable bowel syndrome 09/03/2011  . DIARRHEA 01/25/2011  . ADENOMATOUS COLONIC POLYP 11/21/2010  . CONSTIPATION 11/21/2010  . ROTATOR CUFF SYNDROME, LEFT 11/21/2010  . ABDOMINAL PAIN 11/21/2010  . CARCINOMA, BREAST, HX OF 11/21/2010  . SHOULDER, ARTHRITIS, DEGEN./OSTEO 03/30/2010  . RUPTURE ROTATOR CUFF 03/30/2010    PT - End of Session Activity Tolerance: Patient tolerated treatment well  GP    Juel Burrow 09/21/2013, 3:21 PM

## 2013-09-23 ENCOUNTER — Ambulatory Visit (HOSPITAL_COMMUNITY)
Admission: RE | Admit: 2013-09-23 | Discharge: 2013-09-23 | Disposition: A | Discharge status: Home / Self Care | Payer: BC Managed Care – PPO | Source: Ambulatory Visit | Attending: Physical Medicine and Rehabilitation | Admitting: Physical Medicine and Rehabilitation

## 2013-09-23 DIAGNOSIS — IMO0001 Reserved for inherently not codable concepts without codable children: Secondary | ICD-10-CM | POA: Insufficient documentation

## 2013-09-23 DIAGNOSIS — M542 Cervicalgia: Secondary | ICD-10-CM | POA: Insufficient documentation

## 2013-09-23 DIAGNOSIS — R29898 Other symptoms and signs involving the musculoskeletal system: Secondary | ICD-10-CM

## 2013-09-23 DIAGNOSIS — M5412 Radiculopathy, cervical region: Secondary | ICD-10-CM

## 2013-09-23 DIAGNOSIS — I10 Essential (primary) hypertension: Secondary | ICD-10-CM | POA: Insufficient documentation

## 2013-09-23 DIAGNOSIS — E119 Type 2 diabetes mellitus without complications: Secondary | ICD-10-CM | POA: Insufficient documentation

## 2013-09-23 MED ORDER — NEOMYCIN-POLYMYXIN-DEXAMETH 3.5-10000-0.1 OP SUSP
OPHTHALMIC | Status: AC
  Filled 2013-09-23: qty 5

## 2013-09-23 MED ORDER — PHENYLEPHRINE HCL 2.5 % OP SOLN
OPHTHALMIC | Status: AC
  Filled 2013-09-23: qty 15

## 2013-09-23 MED ORDER — CYCLOPENTOLATE-PHENYLEPHRINE OP SOLN OPTIME - NO CHARGE
OPHTHALMIC | Status: AC
  Filled 2013-09-23: qty 2

## 2013-09-23 MED ORDER — LIDOCAINE HCL 3.5 % OP GEL
OPHTHALMIC | Status: AC
  Filled 2013-09-23: qty 1

## 2013-09-23 MED ORDER — LIDOCAINE HCL (PF) 1 % IJ SOLN
INTRAMUSCULAR | Status: AC
  Filled 2013-09-23: qty 2

## 2013-09-23 MED ORDER — TETRACAINE HCL 0.5 % OP SOLN
OPHTHALMIC | Status: AC
  Filled 2013-09-23: qty 2

## 2013-09-23 NOTE — Progress Notes (Signed)
Physical Therapy Treatment Patient Details  Name: Ann Fuller MRN: [id number] Date of Birth: [birth year]  Today's Date: 09/23/2013 Time: 4098-1191 PT Time Calculation (min): 57 min Charges: TE: 1432-1500 Manual: 1500-1519 Ice: 4782-9562 Visit#: 5 of 8  Re-eval: 10/07/13    Authorization: [facility] Medicare  Authorization Time Period:    Authorization Visit#: 5 of 10   Subjective: Symptoms/Limitations Symptoms: Pt reports that she was up all last night with pain.  Tried to do her exercises without relief.  Pain Assessment Currently in Pain?: No/denies  Precautions/Restrictions     Exercise/Treatments Mobility/Balance        Administrator, [employer] for Strengthening UBE (Upper Arm Bike): 6 min backwards 1.0 for postural strengthening Theraband Exercises Scapula Retraction: 15 reps;Green Shoulder Extension: 15 reps;Green Rows: 10 reps;Green Standing Exercises Upper Extremity Flexion with Stabilization: Flexion;10 reps;Limitations UE Flexion with Stabilization Limitations: VC, TC to BUE w/PT Faciliation Other Standing Exercises: Corner Elbow Presses 10x10 sec holds Seated Exercises   Supine Exercises   Sidelying Exercises   Prone Exercises   Hand Exercises for Cervical Radiculopathy    Supine   Seated   Prone    Sidelying   Standing   Pulleys   Therapy Ball Flexion: 15 reps ABduction: 15 reps;Other (comment) ABduction Limitations: BUE Right/Left: 5 reps ROM / Strengthening / Isometric Strengthening     Stretches   Power Physiological scientist           Modalities Modalities: Cryotherapy Manual Therapy Manual Therapy: Joint mobilization Joint Mobilization: Movement with mobilization seated to Rt shoulder for ER, IR, flexion and abduction. Myofascial Release: Seated: Rt UT and scapular region to decreased fascial restrictions  Other Manual Therapy: Seated: PROM to improve shoulder flexion, ER, IR, abduction Cryotherapy Number  Minutes Cryotherapy: 10 Minutes Cryotherapy Location: Shoulder Type of Cryotherapy: Ice pack  Physical Therapy Assessment and Plan PT Assessment and Plan Clinical Impression Statement: Pt able to achieve Rt shoulder IR up to her lower back.  Continues to have significant weakness with Rt lower trap strength and added isometric strengthening to improve strength.  Added seated activities that required max PT faciliation and AAROM to complete due to decreased strength and pain at end range.  PT Plan: Continue with focus on improve postural strength and mechanics.   Add prone on elbows    Goals    Problem List Patient Active Problem List   Diagnosis Date Noted  . Cervical radiculopathy 09/08/2013  . Weakness of right upper extremity 09/08/2013  . Moderate persistent asthma do to atopic features 05/02/2012  . GERD (gastroesophageal reflux disease) 11/23/2011  . Irritable bowel syndrome 09/03/2011  . DIARRHEA 01/25/2011  . ADENOMATOUS COLONIC POLYP 11/21/2010  . CONSTIPATION 11/21/2010  . ROTATOR CUFF SYNDROME, LEFT 11/21/2010  . ABDOMINAL PAIN 11/21/2010  . CARCINOMA, BREAST, HX OF 11/21/2010  . SHOULDER, ARTHRITIS, DEGEN./OSTEO 03/30/2010  . RUPTURE ROTATOR CUFF 03/30/2010    PT - End of Session Activity Tolerance: Patient tolerated treatment well PT Plan of Care PT Home Exercise Plan: Provided with Red t-band and postural exercises  GP    Ann Fuller 09/23/2013, 3:30 PM

## 2013-09-24 ENCOUNTER — Encounter (HOSPITAL_COMMUNITY)
Admission: RE | Disposition: A | Discharge status: Home / Self Care | Payer: Self-pay | Source: Ambulatory Visit | Attending: Ophthalmology

## 2013-09-24 ENCOUNTER — Encounter (HOSPITAL_COMMUNITY): Payer: Self-pay | Admitting: *Deleted

## 2013-09-24 ENCOUNTER — Ambulatory Visit (HOSPITAL_COMMUNITY): Payer: BC Managed Care – PPO | Admitting: Anesthesiology

## 2013-09-24 ENCOUNTER — Encounter (HOSPITAL_COMMUNITY): Payer: Self-pay | Admitting: Anesthesiology

## 2013-09-24 ENCOUNTER — Ambulatory Visit (HOSPITAL_COMMUNITY)
Admission: RE | Admit: 2013-09-24 | Discharge: 2013-09-24 | Disposition: A | Discharge status: Home / Self Care | Payer: BC Managed Care – PPO | Source: Ambulatory Visit | Attending: Ophthalmology | Admitting: Ophthalmology

## 2013-09-24 DIAGNOSIS — H2589 Other age-related cataract: Secondary | ICD-10-CM | POA: Insufficient documentation

## 2013-09-24 DIAGNOSIS — E119 Type 2 diabetes mellitus without complications: Secondary | ICD-10-CM | POA: Insufficient documentation

## 2013-09-24 DIAGNOSIS — I10 Essential (primary) hypertension: Secondary | ICD-10-CM | POA: Insufficient documentation

## 2013-09-24 DIAGNOSIS — Z01812 Encounter for preprocedural laboratory examination: Secondary | ICD-10-CM | POA: Insufficient documentation

## 2013-09-24 HISTORY — PX: CATARACT EXTRACTION W/PHACO: SHX586

## 2013-09-24 LAB — GLUCOSE, CAPILLARY: Glucose-Capillary: 103 mg/dL — ABNORMAL HIGH (ref 70–99)

## 2013-09-24 SURGERY — PHACOEMULSIFICATION, CATARACT, WITH IOL INSERTION
Anesthesia: Monitor Anesthesia Care | Site: Eye | Laterality: Right | Wound class: Clean

## 2013-09-24 MED ORDER — POVIDONE-IODINE 5 % OP SOLN
OPHTHALMIC | Status: DC | PRN
  Administered 2013-09-24: 1 via OPHTHALMIC

## 2013-09-24 MED ORDER — MIDAZOLAM HCL 2 MG/2ML IJ SOLN
INTRAMUSCULAR | Status: AC
  Filled 2013-09-24: qty 2

## 2013-09-24 MED ORDER — LIDOCAINE HCL (PF) 1 % IJ SOLN
INTRAMUSCULAR | Status: DC | PRN
  Administered 2013-09-24: .4 mL

## 2013-09-24 MED ORDER — LACTATED RINGERS IV SOLN
INTRAVENOUS | Status: DC | PRN
  Administered 2013-09-24: 07:00:00 via INTRAVENOUS

## 2013-09-24 MED ORDER — NEOMYCIN-POLYMYXIN-DEXAMETH 3.5-10000-0.1 OP SUSP
1.0000 [drp] | Freq: Once | OPHTHALMIC | Status: AC
  Administered 2013-09-24: 2 [drp] via OPHTHALMIC

## 2013-09-24 MED ORDER — CYCLOPENTOLATE-PHENYLEPHRINE 0.2-1 % OP SOLN
1.0000 [drp] | OPHTHALMIC | Status: AC
  Administered 2013-09-24 (×3): 1 [drp] via OPHTHALMIC

## 2013-09-24 MED ORDER — PROVISC 10 MG/ML IO SOLN
INTRAOCULAR | Status: DC | PRN
  Administered 2013-09-24: 8.5 mg via INTRAOCULAR

## 2013-09-24 MED ORDER — LIDOCAINE 3.5 % OP GEL OPTIME - NO CHARGE
OPHTHALMIC | Status: DC | PRN
  Administered 2013-09-24: 2 [drp] via OPHTHALMIC

## 2013-09-24 MED ORDER — LACTATED RINGERS IV SOLN
INTRAVENOUS | Status: DC
  Administered 2013-09-24: 1000 mL via INTRAVENOUS

## 2013-09-24 MED ORDER — EPINEPHRINE HCL 1 MG/ML IJ SOLN
INTRAMUSCULAR | Status: AC
  Filled 2013-09-24: qty 1

## 2013-09-24 MED ORDER — PHENYLEPHRINE HCL 2.5 % OP SOLN
1.0000 [drp] | OPHTHALMIC | Status: AC
  Administered 2013-09-24 (×3): 1 [drp] via OPHTHALMIC

## 2013-09-24 MED ORDER — EPINEPHRINE HCL 1 MG/ML IJ SOLN
INTRAOCULAR | Status: DC | PRN
  Administered 2013-09-24: 08:00:00

## 2013-09-24 MED ORDER — TETRACAINE HCL 0.5 % OP SOLN
1.0000 [drp] | OPHTHALMIC | Status: AC
  Administered 2013-09-24 (×3): 1 [drp] via OPHTHALMIC

## 2013-09-24 MED ORDER — LIDOCAINE HCL 3.5 % OP GEL
1.0000 "application " | Freq: Once | OPHTHALMIC | Status: AC
  Administered 2013-09-24: 1 via OPHTHALMIC

## 2013-09-24 MED ORDER — MIDAZOLAM HCL 2 MG/2ML IJ SOLN
1.0000 mg | INTRAMUSCULAR | Status: DC | PRN
  Administered 2013-09-24: 2 mg via INTRAVENOUS

## 2013-09-24 MED ORDER — BSS IO SOLN
INTRAOCULAR | Status: DC | PRN
  Administered 2013-09-24: 15 mL via INTRAOCULAR

## 2013-09-24 SURGICAL SUPPLY — 32 items
CAPSULAR TENSION RING-AMO (OPHTHALMIC RELATED) IMPLANT
CLOTH BEACON ORANGE TIMEOUT ST (SAFETY) ×1 IMPLANT
EYE SHIELD UNIVERSAL CLEAR (GAUZE/BANDAGES/DRESSINGS) ×1 IMPLANT
GLOVE BIO SURGEON STRL SZ 6.5 (GLOVE) IMPLANT
GLOVE BIOGEL PI IND STRL 6.5 (GLOVE) IMPLANT
GLOVE BIOGEL PI IND STRL 7.0 (GLOVE) IMPLANT
GLOVE BIOGEL PI IND STRL 7.5 (GLOVE) IMPLANT
GLOVE BIOGEL PI INDICATOR 6.5 (GLOVE) ×1
GLOVE BIOGEL PI INDICATOR 7.0 (GLOVE)
GLOVE BIOGEL PI INDICATOR 7.5 (GLOVE)
GLOVE ECLIPSE 6.5 STRL STRAW (GLOVE) IMPLANT
GLOVE ECLIPSE 7.0 STRL STRAW (GLOVE) IMPLANT
GLOVE ECLIPSE 7.5 STRL STRAW (GLOVE) IMPLANT
GLOVE EXAM NITRILE LRG STRL (GLOVE) ×1 IMPLANT
GLOVE EXAM NITRILE MD LF STRL (GLOVE) IMPLANT
GLOVE SKINSENSE NS SZ6.5 (GLOVE)
GLOVE SKINSENSE NS SZ7.0 (GLOVE)
GLOVE SKINSENSE STRL SZ6.5 (GLOVE) IMPLANT
GLOVE SKINSENSE STRL SZ7.0 (GLOVE) IMPLANT
KIT VITRECTOMY (OPHTHALMIC RELATED) IMPLANT
PAD ARMBOARD 7.5X6 YLW CONV (MISCELLANEOUS) ×1 IMPLANT
PROC W NO LENS (INTRAOCULAR LENS)
PROC W SPEC LENS (INTRAOCULAR LENS)
PROCESS W NO LENS (INTRAOCULAR LENS) IMPLANT
PROCESS W SPEC LENS (INTRAOCULAR LENS) IMPLANT
RING MALYGIN (MISCELLANEOUS) IMPLANT
SIGHTPATH CAT PROC W REG LENS (Ophthalmic Related) ×2 IMPLANT
SYR TB 1ML LL NO SAFETY (SYRINGE) ×1 IMPLANT
TAPE SURG TRANSPORE 1 IN (GAUZE/BANDAGES/DRESSINGS) IMPLANT
TAPE SURGICAL TRANSPORE 1 IN (GAUZE/BANDAGES/DRESSINGS) ×1
VISCOELASTIC ADDITIONAL (OPHTHALMIC RELATED) IMPLANT
WATER STERILE IRR 250ML POUR (IV SOLUTION) ×1 IMPLANT

## 2013-09-24 NOTE — Preoperative (Signed)
Beta Blockers   Reason not to administer Beta Blockers:Not Applicable 

## 2013-09-24 NOTE — Anesthesia Preprocedure Evaluation (Signed)
Anesthesia Evaluation  Patient identified by MRN, date of birth, ID band Patient awake    Reviewed: Allergy & Precautions, H&P , NPO status , Patient's Chart, lab work & pertinent test results  History of Anesthesia Complications (+) PONV  Airway Mallampati: III TM Distance: >3 FB Neck ROM: Full  Mouth opening: Limited Mouth Opening  Dental  (+) Teeth Intact   Pulmonary shortness of breath, asthma ,  breath sounds clear to auscultation        Cardiovascular hypertension, Pt. on medications Rhythm:Regular Rate:Normal     Neuro/Psych    GI/Hepatic hiatal hernia, GERD-  ,  Endo/Other  diabetes, Type 2, Oral Hypoglycemic Agents  Renal/GU      Musculoskeletal   Abdominal   Peds  Hematology   Anesthesia Other Findings   Reproductive/Obstetrics                           Anesthesia Physical Anesthesia Plan  ASA: III  Anesthesia Plan: MAC   Post-op Pain Management:    Induction: Intravenous  Airway Management Planned: Nasal Cannula  Additional Equipment:   Intra-op Plan:   Post-operative Plan:   Informed Consent: I have reviewed the patients History and Physical, chart, labs and discussed the procedure including the risks, benefits and alternatives for the proposed anesthesia with the patient or authorized representative who has indicated his/her understanding and acceptance.     Plan Discussed with:   Anesthesia Plan Comments:         Anesthesia Quick Evaluation

## 2013-09-24 NOTE — Anesthesia Procedure Notes (Signed)
Procedure Name: MAC Date/Time: 09/24/2013 7:25 AM Performed by: Carolyne Littles, AMY L Pre-anesthesia Checklist: Patient identified, Timeout performed, Emergency Drugs available, Suction available and Patient being monitored Oxygen Delivery Method: Nasal cannula

## 2013-09-24 NOTE — Anesthesia Postprocedure Evaluation (Signed)
  Anesthesia Post-op Note  Patient: Ann Fuller  Procedure(s) Performed: Procedure(s) with comments: CATARACT EXTRACTION PHACO AND INTRAOCULAR LENS PLACEMENT (IOC) (Right) - CDE:  8.16  Patient Location: Short Stay  Anesthesia Type:MAC  Level of Consciousness: awake, alert , oriented and patient cooperative  Airway and Oxygen Therapy: Patient Spontanous Breathing  Post-op Pain: none  Post-op Assessment: Post-op Vital signs reviewed, Patient's Cardiovascular Status Stable, Respiratory Function Stable, Patent Airway, No signs of Nausea or vomiting and Pain level controlled  Post-op Vital Signs: Reviewed and stable  Complications: No apparent anesthesia complications

## 2013-09-24 NOTE — Op Note (Signed)
Date of Admission: 09/24/2013  Date of Surgery: 09/24/2013  Pre-Op Dx: Cataract  Right  Eye  Post-Op Dx: Combined Cataract  Right  Eye,  Dx Code 366.19  Surgeon: Gemma Payor, M.D.  Assistants: None  Anesthesia: Topical with MAC  Indications: Painless, progressive loss of vision with compromise of daily activities.  Surgery: Cataract Extraction with Intraocular lens Implant Right Eye  Discription: The patient had dilating drops and viscous lidocaine placed into the right eye in the pre-op holding area. After transfer to the operating room, a time out was performed. The patient was then prepped and draped. Beginning with a 75 degree blade a paracentesis port was made at the surgeon's 2 o'clock position. The anterior chamber was then filled with 1% non-preserved lidocaine. This was followed by filling the anterior chamber with Provisc.  A 2.86mm keratome blade was used to make a clear corneal incision at the temporal limbus.  A bent cystatome needle was used to create a continuous tear capsulotomy. Hydrodissection was performed with balanced salt solution on a Fine canula. The lens nucleus was then removed using the phacoemulsification handpiece. Residual cortex was removed with the I&A handpiece. The anterior chamber and capsular bag were refilled with Provisc. A posterior chamber intraocular lens was placed into the capsular bag with it's injector. The implant was positioned with the Kuglan hook. The Provisc was then removed from the anterior chamber and capsular bag with the I&A handpiece. Stromal hydration of the main incision and paracentesis port was performed with BSS on a Fine canula. The wounds were tested for leak which was negative. The patient tolerated the procedure well. There were no operative complications. The patient was then transferred to the recovery room in stable condition.  Complications: None  Specimen: None  EBL: None  Prosthetic device: B&L enVista, MX60, power 20.0D, SN  [id number].

## 2013-09-24 NOTE — H&P (Signed)
I have reviewed the H&P, the patient was re-examined, and I have identified no interval changes in medical condition and plan of care since the history and physical of record  

## 2013-09-24 NOTE — Transfer of Care (Signed)
Immediate Anesthesia Transfer of Care Note  Patient: Ann Fuller  Procedure(s) Performed: Procedure(s) with comments: CATARACT EXTRACTION PHACO AND INTRAOCULAR LENS PLACEMENT (IOC) (Right) - CDE:  8.16  Patient Location: Short Stay  Anesthesia Type:MAC  Level of Consciousness: awake, alert , oriented and patient cooperative  Airway & Oxygen Therapy: Patient Spontanous Breathing  Post-op Assessment: Report given to PACU RN and Post -op Vital signs reviewed and stable  Post vital signs: Reviewed and stable  Complications: No apparent anesthesia complications

## 2013-09-25 ENCOUNTER — Encounter (HOSPITAL_COMMUNITY): Payer: Self-pay | Admitting: Ophthalmology

## 2013-09-28 ENCOUNTER — Ambulatory Visit (HOSPITAL_COMMUNITY)
Admission: RE | Admit: 2013-09-28 | Discharge: 2013-09-28 | Disposition: A | Discharge status: Home / Self Care | Payer: BC Managed Care – PPO | Source: Ambulatory Visit | Attending: Internal Medicine | Admitting: Internal Medicine

## 2013-09-28 NOTE — Progress Notes (Signed)
Physical Therapy Treatment Patient Details  Name: Ann Fuller MRN: [id number] Date of Birth: 12-13-1946  Today's Date: 09/28/2013 Time: 1435-1528 PT Time Calculation (min): 53 min  Visit#: 6 of 8  Re-eval: 10/07/13 Authorization: [employer] Medicare  Authorization Visit#: 6 of 10  Charges:  therex 1435-1500 (25'), manual 1502-1516 (14'), icepack 1517-1527 (10')  Subjective:  Pt states she is currently not hurting, however reports pain woke her at 3am and had a tough time going to sleep.  Suggested pt try a contoured pillow to increase comfort.    Exercise/Treatments Machines for Strengthening UBE (Upper Arm Bike): 6 min backwards 1.0 for postural strengthening Theraband Exercises Scapula Retraction: 15 reps;Green Shoulder Extension: 15 reps;Green Rows: 15 reps;Green Standing Exercises Upper Extremity Flexion with Stabilization: Flexion;10 reps;Limitations UE Flexion with Stabilization Limitations: VC, TC to BUE w/PT Faciliation Seated Exercises Neck Retraction: 10 reps W Back: 10 reps;Limitations Other Seated Exercise: Shoulder abduction 10 reps     Manual Therapy Manual Therapy: Other (comment) Other Manual Therapy: supine:  gentle cervical traction with occiptal release, PROM to Rt shoulder and massage to Rt UT Cryotherapy Number Minutes Cryotherapy: 10 Minutes Cryotherapy Location: Shoulder Type of Cryotherapy: Ice pack  Physical Therapy Assessment and Plan PT Assessment and Plan Clinical Impression Statement: Progressed Rt shoulder and scapular exercises.  OVerall improvement in pain today, however continues to have symptoms into Rt UE.  Continues to require AAROM to complete correctly, inittiate correct mm groups.  Performed gentle cervical traction and occipital release to help decrease spasm and radiculopathy.  Completed session with ice to Rt shoulder to help decrease pain/discomfort. PT Plan: Continue with focus on improve postural strength and mechanics.   Add  prone on elbows and sidelying Rt UE exercises.     Problem List Patient Active Problem List   Diagnosis Date Noted  . Cervical radiculopathy 09/08/2013  . Weakness of right upper extremity 09/08/2013  . Moderate persistent asthma do to atopic features 05/02/2012  . GERD (gastroesophageal reflux disease) 11/23/2011  . Irritable bowel syndrome 09/03/2011  . DIARRHEA 01/25/2011  . ADENOMATOUS COLONIC POLYP 11/21/2010  . CONSTIPATION 11/21/2010  . ROTATOR CUFF SYNDROME, LEFT 11/21/2010  . ABDOMINAL PAIN 11/21/2010  . CARCINOMA, BREAST, HX OF 11/21/2010  . SHOULDER, ARTHRITIS, DEGEN./OSTEO 03/30/2010  . RUPTURE ROTATOR CUFF 03/30/2010    PT - End of Session Activity Tolerance: Patient tolerated treatment well PT Plan of Care PT Home Exercise Plan: Provided with Red t-band and postural exercises    Lurena Nida, PTA/CLT 09/28/2013, 3:23 PM

## 2013-09-30 ENCOUNTER — Ambulatory Visit (HOSPITAL_COMMUNITY)
Admission: RE | Admit: 2013-09-30 | Discharge: 2013-09-30 | Disposition: A | Discharge status: Home / Self Care | Payer: BC Managed Care – PPO | Source: Ambulatory Visit | Attending: Internal Medicine | Admitting: Internal Medicine

## 2013-09-30 ENCOUNTER — Encounter (INDEPENDENT_AMBULATORY_CARE_PROVIDER_SITE_OTHER): Payer: Self-pay

## 2013-09-30 DIAGNOSIS — R29898 Other symptoms and signs involving the musculoskeletal system: Secondary | ICD-10-CM

## 2013-09-30 DIAGNOSIS — M5412 Radiculopathy, cervical region: Secondary | ICD-10-CM

## 2013-09-30 NOTE — Evaluation (Signed)
Physical Therapy Re-Evaluation  Patient Details  Name: Ann Fuller MRN: [id number] Date of Birth: [birth year]  Today's Date: 09/30/2013 Time: 2956-2130 PT Time Calculation (min): 40 min Charges: TE: 1435-1450 ROM/MMT: 1  Self Care: [id number] Self Care:              Visit#: 7 of 13  Re-eval: 10/30/13 Assessment Diagnosis: Cervical radiculopathy Surgical Date: 03/24/13 Next MD Visit: Dr. Chong Sicilian -   Authorization: [facility] Medicare    Authorization Time Period:    Authorization Visit#: 7 of 10   Subjective Symptoms/Limitations Symptoms: Pt reports that she had a rough time with her neck last night, denies pain to her arm.  Explained centralization.   Sensation/Coordination/Flexibility/Functional Tests Functional Tests Functional Tests: Neck Disability Index (NDI): 50%   Assessment RUE AROM (degrees) Right Shoulder Extension: 50 Degrees (was 10) Right Shoulder Internal Rotation:  (to L5 (was IR w/extenison to top of gluteal fold)) RUE Strength RUE Overall Strength Comments: mid trap: 3+/5 (was 2+/5); low trap: 3+/5 (was 3/5) Right Shoulder Flexion: 3+/5 (was 3+/5) Right Shoulder Extension: 4/5 (was 3+/5) Right Shoulder ABduction: 3+/5 (was 3+/5) Right Shoulder Internal Rotation: 3+/5 (was 3/5) Right Shoulder External Rotation: 4/5 (was 3+/5) Cervical AROM Cervical - Right Side Bend: 12 (was 12 cm w/pain) Cervical - Left Side Bend: 12 (was 12 cm) Cervical - Right Rotation: 10 (was 14 cm w/pain) Cervical - Left Rotation: 10 (was 12 cm w/pain) Cervical Strength Overall Cervical Strength Comments: capital flexion: 4/5 (was 3/5) (was 3/5) Cervical Flexion: 5/5 (was 4/5) Cervical Extension: 5/5 (was 4/5) Cervical - Right Side Bend: 5/5 (was 4/5) Cervical - Left Side Bend: 5/5 (was 4/5) Cervical - Right Rotation: 5/5 (was 4/5) Cervical - Left Rotation: 5/5 (was 4/5) Palpation Palpation: pain and tenderness with moderate fascial restriction to cervical region    Mobility/Balance  Posture/Postural Control Posture/Postural Control: Postural limitations Postural Limitations: upper cross syndrome, lower cross syndrome   Exercise/Treatments ROM / Strengthening / Isometric Strengthening UBE (Upper Arm Bike): 6 minutes backwards Thumb Tacks: 1 minute Wall Pushups: 10 reps Prot/Ret//Elev/Dep: 10 w/PT faciliation  Physical Therapy Assessment and Plan PT Assessment and Plan Clinical Impression Statement: Ms. Antosh has attended 7 OP PT visits to address cervical radiculopathy to RUE with the following findings: had significant decrease in radicular symptoms with centralazation pain, is making steady progress with cervical ROM and scapular strength and motion.  At this pt continues to have impairments listed below.  Pt will benefit from skilled therapeutic intervention in order to improve on the following deficits: Decreased strength;Pain;Decreased coordination;Impaired perceived functional ability;Increased muscle spasms;Decreased range of motion;Increased fascial restricitons;Improper body mechanics;Decreased activity tolerance PT Frequency: Min 2X/week PT Duration: 4 weeks PT Treatment/Interventions: Therapeutic activities;Therapeutic exercise;Neuromuscular re-education;Patient/family education;Manual techniques;Modalities PT Plan: Improve shoulder strength with proximal shoulder strengthening (flexion, arm circles (clockwise and counter clockwise), wall washing) Continue with manual techniques to improve shoulder motion and decreased cervical pain.     Goals Home Exercise Program Pt/caregiver will Perform Home Exercise Program: Independently PT Goal: Perform Home Exercise Program - Progress: Met PT Short Term Goals Time to Complete Short Term Goals: 3 weeks PT Short Term Goal 1: Pt will report pain to her RUE less than 5/10during the night time hours and a 50% reducation in radicular symptoms.  (pain to cervical spine 8/10) PT Short Term Goal 1 -  Progress: Partly met PT Short Term Goal 2: Pt will improve her cervical and scapular stabalizers by 1 muscle grade for greater ease with brushing her  hair.  PT Short Term Goal 2 - Progress: Progressing toward goal PT Short Term Goal 3: Pt will improve her Rt shoulder IR and be able to place it behind her back to waist level in order to complete necessary ADL's.  PT Short Term Goal 3 - Progress: Met PT Short Term Goal 4: Pt will be educated on proper lymphedema massage to cervical region.  PT Short Term Goal 4 - Progress: Met PT Long Term Goals Time to Complete Long Term Goals: 8 weeks PT Long Term Goal 1: Pt will improve her cervical and scapula stabalizer strength to [facility] in order to sit with approprriate posture x10 minutes in order to decrease radicular symptoms to her RUE,  in order to progress with greater ease of sitting in a car.   PT Long Term Goal 1 - Progress: Progressing toward goal PT Long Term Goal 2: Pt will improve her DASH to less than 40% for improved percieved functional ability.  (50%) PT Long Term Goal 2 - Progress: Progressing toward goal Long Term Goal 3: Pt will improve her cervical AROM for greater ease with turning her head while driving in a car.   Problem List Patient Active Problem List   Diagnosis Date Noted  . Cervical radiculopathy 09/08/2013  . Weakness of right upper extremity 09/08/2013  . Moderate persistent asthma do to atopic features 05/02/2012  . GERD (gastroesophageal reflux disease) 11/23/2011  . Irritable bowel syndrome 09/03/2011  . DIARRHEA 01/25/2011  . ADENOMATOUS COLONIC POLYP 11/21/2010  . CONSTIPATION 11/21/2010  . ROTATOR CUFF SYNDROME, LEFT 11/21/2010  . ABDOMINAL PAIN 11/21/2010  . CARCINOMA, BREAST, HX OF 11/21/2010  . SHOULDER, ARTHRITIS, DEGEN./OSTEO 03/30/2010  . RUPTURE ROTATOR CUFF 03/30/2010    PT Plan of Care PT Patient Instructions: discussed NDI, posture and HEP, lymphedema massage and compression garments for thoracic  region.  Consulted and Agree with Plan of Care: Patient  GP    Annett Fabian, MPT, ATC 09/30/2013, 5:20 PM  Physician Documentation Your signature is required to indicate approval of the treatment plan as stated above.  Please sign and either send electronically or make a copy of this report for your files and return this physician signed original.   Please mark one 1.__approve of plan  2. ___approve of plan with the following conditions.   ______________________________                                                          _____________________ Physician Signature                                                                                                             Date

## 2013-10-07 ENCOUNTER — Ambulatory Visit (HOSPITAL_COMMUNITY)
Admission: RE | Admit: 2013-10-07 | Discharge: 2013-10-07 | Disposition: A | Discharge status: Home / Self Care | Payer: BC Managed Care – PPO | Source: Ambulatory Visit | Attending: Internal Medicine | Admitting: Internal Medicine

## 2013-10-07 NOTE — Progress Notes (Signed)
Physical Therapy Treatment Patient Details  Name: Ann Fuller MRN: [id number] Date of Birth: 30-Sep-1946  Today's Date: 10/07/2013 Time: 1605-1658 PT Time Calculation (min): 53 min  Visit#: 8 of 13  Re-eval: 10/30/13 Authorization: [employer]  Authorization Visit#: 8 of 10 Charges:  therex 1605-1630 (25'), manual [id number] (15'), ice [id number] (10')   Subjective: Symptoms/Limitations Symptoms: Pt states she's hurting some today; having spasms in her Rt upper trap area and into her Rt shoulder blade.  Pt comes with new orders to continue therapy X 4 more weeks for cervical radiculopathy. Pain Assessment Currently in Pain?: Yes Pain Score: 7  Pain Location: Shoulder Pain Orientation: Right   Exercise/Treatments Supine Protraction: 10 reps;Right;Weights Protraction Weight (lbs): 1 Horizontal ABduction: 10 reps;Weights;Right Horizontal ABduction Weight (lbs): 1 External Rotation: 10 reps;Weights;Right External Rotation Weight (lbs): 1 Internal Rotation: 10 reps;Right Internal Rotation Weight (lbs): 1 Flexion: 10 reps;Weights;Right Shoulder Flexion Weight (lbs): 1 Sidelying External Rotation: 10 reps;Weights;Right External Rotation Weight (lbs): 1 ABduction: 10 reps;Right;Weights ABduction Weight (lbs): 1 ROM / Strengthening / Isometric Strengthening UBE (Upper Arm Bike): 6 minutes backwards Thumb Tacks: 1 minute Wall Pushups: 10 reps Other ROM/Strengthening Exercises: corner stretch 2X30"     Modalities Modalities: Cryotherapy Manual Therapy Manual Therapy: Other (comment) Other Manual Therapy: sidelying scapular mobs, prone STM to Rt upper and mid trap Cryotherapy Number Minutes Cryotherapy: 10 Minutes Cryotherapy Location: Shoulder Type of Cryotherapy: Ice pack  Physical Therapy Assessment and Plan PT Assessment and Plan PT Assessment:  Added weight today to Rt UE therex to increase strength; Pt with multiple spasms/tightness in Rt upper and mid  trap regions and subscapular.  Able to resolve and decrease overall tightness.  Pt reported painfree in Rt shoulder at end of session. PT Plan: Improve shoulder strength with proximal shoulder strengthening (flexion, arm circles (clockwise and counter clockwise), wall washing) Continue with manual techniques to improve shoulder motion and decreased cervical pain.   May add traction if radicular pain continues into Rt UE.    Problem List Patient Active Problem List   Diagnosis Date Noted  . Cervical radiculopathy 09/08/2013  . Weakness of right upper extremity 09/08/2013  . Moderate persistent asthma do to atopic features 05/02/2012  . GERD (gastroesophageal reflux disease) 11/23/2011  . Irritable bowel syndrome 09/03/2011  . DIARRHEA 01/25/2011  . ADENOMATOUS COLONIC POLYP 11/21/2010  . CONSTIPATION 11/21/2010  . ROTATOR CUFF SYNDROME, LEFT 11/21/2010  . ABDOMINAL PAIN 11/21/2010  . CARCINOMA, BREAST, HX OF 11/21/2010  . SHOULDER, ARTHRITIS, DEGEN./OSTEO 03/30/2010  . RUPTURE ROTATOR CUFF 03/30/2010         Lurena Nida, PTA/CLT 10/07/2013, 5:02 PM

## 2013-10-09 ENCOUNTER — Inpatient Hospital Stay (HOSPITAL_COMMUNITY): Admission: RE | Admit: 2013-10-09 | Payer: BC Managed Care – PPO | Source: Ambulatory Visit

## 2013-10-13 ENCOUNTER — Ambulatory Visit (HOSPITAL_COMMUNITY)
Admission: RE | Admit: 2013-10-13 | Discharge: 2013-10-13 | Disposition: A | Discharge status: Home / Self Care | Payer: BC Managed Care – PPO | Source: Ambulatory Visit | Attending: Internal Medicine | Admitting: Internal Medicine

## 2013-10-13 DIAGNOSIS — R29898 Other symptoms and signs involving the musculoskeletal system: Secondary | ICD-10-CM

## 2013-10-13 DIAGNOSIS — M5412 Radiculopathy, cervical region: Secondary | ICD-10-CM

## 2013-10-13 NOTE — Progress Notes (Addendum)
Physical Therapy Treatment Patient Details  Name: Ann Fuller MRN: [id number] Date of Birth: 12-12-1946  Today's Date: 10/13/2013 Time: 4098-1191 PT Time Calculation (min): 41 min Charge: TE 1430-1503, Manual [id number]  Visit#: 9 of 13  Re-eval: 10/30/13 Assessment Diagnosis: Cervical radiculopathy Surgical Date: 03/24/13 Next MD Visit: Dr. Chong Sicilian - , Dr Faythe Ghee next week  Authorization: [facility] Medicare  Authorization Time Period:    Authorization Visit#: 9 of 10   Subjective: Symptoms/Limitations Symptoms: Pt stated she was pain free today, most difficulty currently is lifting pots and pans into cabinets above. Pain Assessment Currently in Pain?: No/denies  Objective:   Exercise/Treatments Prone  Extension: Both;10 reps Other Prone Exercises: rows 10x ROM / Strengthening / Isometric Strengthening UBE (Upper Arm Bike): 6 minutes backwards Wall Wash: 2 reps x 1 minute each clockwise and countclockwise Thumb Tacks: 1 minute Wall Pushups: 10 reps Other ROM/Strengthening Exercises: counter to cabinet 1# 10x with flexion and abduction Other ROM/Strengthening Exercises: lifting orange ball from 8 in step to waist then Bil UE flexion   Manual Therapy Manual Therapy: Joint mobilization Joint Mobilization: PROM Rt shoulder all directions, scapular mobs  Physical Therapy Assessment and Plan PT Assessment and Plan Clinical Impression Statement: Added shoulder strengthening exercises to improve functional tasks with education on proper lifting. Pt able to complete all exercises correctly following demonstration and verbal cueing for purpose and technique with task.  No spasms palpated Rt cervical or scapular region.  No report of pain through session.   PT Plan: Gcode due next session.  Improve shoulder strength with proximal shoulder strengthening (flexion, arm circles (clockwise and counter clockwise), wall washing) Continue with manual techniques to improve shoulder motion  and decreased cervical pain.   May add traction if radicular pain continues into Rt UE.    Goals Home Exercise Program Pt/caregiver will Perform Home Exercise Program: Independently PT Short Term Goals Time to Complete Short Term Goals: 3 weeks PT Short Term Goal 1: Pt will report pain to her RUE less than 5/10during the night time hours and a 50% reducation in radicular symptoms.  (pain to cervical spine 8/10) PT Short Term Goal 1 - Progress: Progressing toward goal PT Short Term Goal 2: Pt will improve her cervical and scapular stabalizers by 1 muscle grade for greater ease with brushing her hair.  PT Short Term Goal 3: Pt will improve her Rt shoulder IR and be able to place it behind her back to waist level in order to complete necessary ADL's.  PT Short Term Goal 4: Pt will be educated on proper lymphedema massage to cervical region.  PT Long Term Goals Time to Complete Long Term Goals: 8 weeks PT Long Term Goal 1: Pt will improve her cervical and scapula stabalizer strength to [facility] in order to sit with approprriate posture x10 minutes in order to decrease radicular symptoms to her RUE,  in order to progress with greater ease of sitting in a car.   PT Long Term Goal 1 - Progress: Progressing toward goal PT Long Term Goal 2: Pt will improve her DASH to less than 40% for improved percieved functional ability.  (50%) Long Term Goal 3: Pt will improve her cervical AROM for greater ease with turning her head while driving in a car.   Problem List Patient Active Problem List   Diagnosis Date Noted  . Cervical radiculopathy 09/08/2013  . Weakness of right upper extremity 09/08/2013  . Moderate persistent asthma do to atopic features 05/02/2012  . GERD (  gastroesophageal reflux disease) 11/23/2011  . Irritable bowel syndrome 09/03/2011  . DIARRHEA 01/25/2011  . ADENOMATOUS COLONIC POLYP 11/21/2010  . CONSTIPATION 11/21/2010  . ROTATOR CUFF SYNDROME, LEFT 11/21/2010  . ABDOMINAL PAIN  11/21/2010  . CARCINOMA, BREAST, HX OF 11/21/2010  . SHOULDER, ARTHRITIS, DEGEN./OSTEO 03/30/2010  . RUPTURE ROTATOR CUFF 03/30/2010    PT - End of Session Activity Tolerance: Patient tolerated treatment well General Behavior During Therapy: [facility] for tasks assessed/performed  GP    Juel Burrow 10/13/2013, 3:21 PM

## 2013-10-14 ENCOUNTER — Other Ambulatory Visit: Payer: Self-pay

## 2013-10-14 DIAGNOSIS — Z1231 Encounter for screening mammogram for malignant neoplasm of breast: Secondary | ICD-10-CM

## 2013-10-15 ENCOUNTER — Ambulatory Visit (HOSPITAL_COMMUNITY)
Admission: RE | Admit: 2013-10-15 | Discharge: 2013-10-15 | Disposition: A | Discharge status: Home / Self Care | Payer: BC Managed Care – PPO | Source: Ambulatory Visit

## 2013-10-15 DIAGNOSIS — M5412 Radiculopathy, cervical region: Secondary | ICD-10-CM

## 2013-10-15 DIAGNOSIS — R29898 Other symptoms and signs involving the musculoskeletal system: Secondary | ICD-10-CM

## 2013-10-15 NOTE — Progress Notes (Signed)
Physical Therapy Treatment/G-code update Patient Details  Name: Ann Fuller MRN: [id number] Date of Birth: 06-15-1946  Today's Date: 10/15/2013 Time: 1435-1520 PT Time Calculation (min): 45 min Charge: TE 1435-1500, Manual 1500-1515  Visit#: 10 of 13  Re-eval: 10/30/13 Assessment Diagnosis: Cervical radiculopathy Surgical Date: 03/24/13 Next MD Visit: Dr. Chong Sicilian - , Dr Faythe Ghee next week  Authorization: [facility] Medicare  Authorization Time Period:    Authorization Visit#: 10 of 10   Subjective: Symptoms/Limitations Symptoms: Pain free today, reports able to do more around the house.   Pain Assessment Currently in Pain?: No/denies  Objective:   Exercise/Treatments Supine Protraction: 10 reps;Right;Weights Protraction Weight (lbs): 1 Horizontal ABduction: 10 reps;Weights;Right Horizontal ABduction Weight (lbs): 1 ROM / Strengthening / Isometric Strengthening UBE (Upper Arm Bike): 6 minutes backwards Wall Wash: 2 rminute each clockwise and countclockwise Thumb Tacks: 1 minute Wall Pushups: 10 reps Other ROM/Strengthening Exercises: lifting orange ball from 8 in step to waist then Bil UE flexion, cueing for technique to reduce lumbar flexion with squats    Manual Therapy Manual Therapy: Joint mobilization Joint Mobilization: PROM Rt shoulder all directions, scapular mobs Myofascial Release: Supine with LE elevated, MFR to anterior shoulder/UT to decrease fascial restrictions  Physical Therapy Assessment and Plan PT Assessment and Plan Clinical Impression Statement: Session focus on shoulder strengthening and stability exercises to improve functional tasks.  Pt required multimodal cueing for proper technique with proper lifting to reduce LBP.  Manual technques complete to reduce fascial restrictions to improve flexibilty and improve ROM.  Pt stated pain free at end of session.  Pt completed NDI with improved from 50% to 30% impaired.   PT Plan: Improve shoulder  strength with proximal shoulder strengthening (flexion, arm circles (clockwise and counter clockwise), wall washing) Continue with manual techniques to improve shoulder motion and decreased cervical pain.   May add traction if radicular pain continues into Rt UE.    Goals Home Exercise Program Pt/caregiver will Perform Home Exercise Program: Independently PT Short Term Goals Time to Complete Short Term Goals: 3 weeks PT Short Term Goal 1: Pt will report pain to her RUE less than 5/10during the night time hours and a 50% reducation in radicular symptoms.  (pain to cervical spine 8/10) PT Short Term Goal 1 - Progress: Progressing toward goal PT Short Term Goal 2: Pt will improve her cervical and scapular stabalizers by 1 muscle grade for greater ease with brushing her hair.  PT Short Term Goal 2 - Progress: Progressing toward goal PT Short Term Goal 3: Pt will improve her Rt shoulder IR and be able to place it behind her back to waist level in order to complete necessary ADL's.  PT Short Term Goal 4: Pt will be educated on proper lymphedema massage to cervical region.  PT Long Term Goals Time to Complete Long Term Goals: 8 weeks PT Long Term Goal 1: Pt will improve her cervical and scapula stabalizer strength to [facility] in order to sit with approprriate posture x10 minutes in order to decrease radicular symptoms to her RUE,  in order to progress with greater ease of sitting in a car.   PT Long Term Goal 1 - Progress: Progressing toward goal PT Long Term Goal 2: Pt will improve her DASH to less than 40% for improved percieved functional ability.  (50%) Long Term Goal 3: Pt will improve her cervical AROM for greater ease with turning her head while driving in a car.   Problem List Patient Active Problem List  Diagnosis Date Noted  . Cervical radiculopathy 09/08/2013  . Weakness of right upper extremity 09/08/2013  . Moderate persistent asthma do to atopic features 05/02/2012  . GERD  (gastroesophageal reflux disease) 11/23/2011  . Irritable bowel syndrome 09/03/2011  . DIARRHEA 01/25/2011  . ADENOMATOUS COLONIC POLYP 11/21/2010  . CONSTIPATION 11/21/2010  . ROTATOR CUFF SYNDROME, LEFT 11/21/2010  . ABDOMINAL PAIN 11/21/2010  . CARCINOMA, BREAST, HX OF 11/21/2010  . SHOULDER, ARTHRITIS, DEGEN./OSTEO 03/30/2010  . RUPTURE ROTATOR CUFF 03/30/2010    PT - End of Session Activity Tolerance: Patient tolerated treatment well General Behavior During Therapy: WFL for tasks assessed/performed  GP Functional Assessment Tool Used: NDI: 30% was 50% inital eval Functional Limitation: Carrying, moving and handling objects Carrying, Moving and Handling Objects Current Status ([id number]): At least 20 percent but less than 40 percent impaired, limited or restricted Carrying, Moving and Handling Objects Goal Status [phone]): At least 1 percent but less than 20 percent impaired, limited or restricted  Juel Burrow; Annett Fabian, MPT, ATC 10/15/2013, 3:49 PM

## 2013-10-19 ENCOUNTER — Ambulatory Visit (HOSPITAL_COMMUNITY)
Admission: RE | Admit: 2013-10-19 | Discharge: 2013-10-19 | Disposition: A | Discharge status: Home / Self Care | Payer: BC Managed Care – PPO | Source: Ambulatory Visit | Attending: Internal Medicine | Admitting: Internal Medicine

## 2013-10-19 DIAGNOSIS — R29898 Other symptoms and signs involving the musculoskeletal system: Secondary | ICD-10-CM

## 2013-10-19 DIAGNOSIS — M5412 Radiculopathy, cervical region: Secondary | ICD-10-CM

## 2013-10-19 NOTE — Progress Notes (Signed)
Physical Therapy Treatment Patient Details  Name: Ann Fuller MRN: [id number] Date of Birth: Jan 13, 1946  Today's Date: 10/19/2013 Time: 4098-1191 PT Time Calculation (min): 50 min Charges TE: 1435-1500 Manual: 1500-1525 Ice: [id number] Visit#: 11 of 13  Re-eval: 10/30/13 Assessment Diagnosis: Cervical radiculopathy Surgical Date: 03/24/13 Next MD Visit: Dr. Chong Sicilian - , Dr Faythe Ghee next week  Authorization: [facility] Medicare  Authorization Time Period:    Authorization Visit#: 11 of 10   Subjective: Symptoms/Limitations Symptoms: reports that she had some pain this morning around 7 am and controlled with a heat pack.  Pain Assessment Currently in Pain?: No/denies  Precautions/Restrictions     Exercise/Treatments Mobility/Balance        [employer] for Strengthening UBE (Upper Arm Bike): 6 min backwards 2.0 for postural strengthening Theraband Exercises   Standing Exercises Other Standing Exercises: Corner Elbow Presses 10x10 sec holds Seated Exercises Shoulder Rolls: Backwards;20 reps Shoulder Rolls Limitations: PT faciliation Supine Exercises   Sidelying Exercises   Prone Exercises   Hand Exercises for Cervical Radiculopathy    Supine   Seated Extension: PROM;20 reps;Right Retraction: PROM;Right;20 reps;Left;15 reps Row: PROM;Both;20 reps External Rotation: Both;15 reps;Theraband;PROM;20 reps Theraband Level (Shoulder External Rotation): Level 2 (Red) Flexion: PROM;Right;20 reps Abduction: PROM;Right;20 reps;Left;15 reps Prone    Sidelying   Standing   Pulleys   Therapy Ball   ROM / Strengthening / Isometric Strengthening "W" Arms: x20 reps Seated x20 reps w/max PT factiliation with BLE X to V Arms: Seated x20 reps w/max PT factiliation with BLE Proximal Shoulder Strengthening, Seated: x15 sec each postion: shoulder flexion, shoulder circles forward, shoudler circles backwards, shoulder abduction    Stretches   Power Dentist Therapy Manual Therapy: Joint mobilization Joint Mobilization: Seated and standing Grade II-III to Rt sholder with PROM Rt shoulder all directions, scapular mobs to Rt and Lt shoulder (see doc flow sheet for specifics) Myofascial Release: seated to Rt shoulder upper trapezius and rhomboid trigger points.  Cryotherapy Number Minutes Cryotherapy: 10 Minutes Cryotherapy Location: Shoulder Type of Cryotherapy: Ice pack  Physical Therapy Assessment and Plan PT Assessment and Plan Clinical Impression Statement: Pt enters with significant improved posture and improved coordinated scapular movement, continues to be limited by her strength and functional shoulder AROM.  Added seated and standing PROM to Rt and Lt shoulder to improve posture and shoulder ROM to decrease pain. Ended session with manual techniques with a moderate decrease in fascial restrictions.  Most limiting is a rhomboid trigger point which mimics morning pain.  PT Plan: Improve shoulder strength with proximal shoulder strengthening (flexion, arm circles (clockwise and counter clockwise), wall washing) Continue with manual techniques to improve shoulder motion and decreased cervical pain.   May add traction if radicular pain continues into Rt UE.    Goals    Problem List Patient Active Problem List   Diagnosis Date Noted  . Cervical radiculopathy 09/08/2013  . Weakness of right upper extremity 09/08/2013  . Moderate persistent asthma do to atopic features 05/02/2012  . GERD (gastroesophageal reflux disease) 11/23/2011  . Irritable bowel syndrome 09/03/2011  . DIARRHEA 01/25/2011  . ADENOMATOUS COLONIC POLYP 11/21/2010  . CONSTIPATION 11/21/2010  . ROTATOR CUFF SYNDROME, LEFT 11/21/2010  . ABDOMINAL PAIN 11/21/2010  . CARCINOMA, BREAST, HX OF 11/21/2010  . SHOULDER, ARTHRITIS, DEGEN./OSTEO 03/30/2010  . RUPTURE ROTATOR CUFF 03/30/2010    PT - End of Session Activity Tolerance:  Patient  tolerated treatment well General Behavior During Therapy: WFL for tasks assessed/performed  GP Functional Assessment Tool Used: NDI: 30% was 50% inital eval  Clearance Chenault 10/19/2013, 3:28 PM

## 2013-10-21 ENCOUNTER — Ambulatory Visit (HOSPITAL_COMMUNITY)
Admission: RE | Admit: 2013-10-21 | Discharge: 2013-10-21 | Disposition: A | Discharge status: Home / Self Care | Payer: BC Managed Care – PPO | Source: Ambulatory Visit | Attending: Internal Medicine | Admitting: Internal Medicine

## 2013-10-21 DIAGNOSIS — R29898 Other symptoms and signs involving the musculoskeletal system: Secondary | ICD-10-CM

## 2013-10-21 DIAGNOSIS — M5412 Radiculopathy, cervical region: Secondary | ICD-10-CM

## 2013-10-21 NOTE — Progress Notes (Signed)
Physical Therapy Treatment Patient Details  Name: Ann Fuller MRN: [id number] Date of Birth: [birth year]  Today's Date: 10/21/2013 Time: 4098-1191 PT Time Calculation (min): 55 min Charges: TE: 1430-1500 Manual: 1500-1525 Visit#: 12 of 13  Re-eval: 10/30/13 Assessment Diagnosis: Cervical radiculopathy Surgical Date: 03/24/13 Next MD Visit: Dr. Chong Sicilian - , Dr Faythe Ghee November 11  Authorization: [facility] Medicare  Authorization Time Period:    Authorization Visit#: 12 of 20   Subjective: Symptoms/Limitations Symptoms: Pt reports she was sore after last treatment, does not know if she woke up with increased pain yesterday. Today woke up at 7 am with pain and needed pain medication.  Pain Assessment Currently in Pain?: No/denies  Precautions/Restrictions  Precautions Precaution Comments: Hx of Breast Cancer  Exercise/Treatments  10/21/13 1600  Neck Exercises: Machines for Strengthening  UBE (Upper Arm Bike) 6 min backwards 2.0 for postural strengthening  Neck Exercises: Prone  W Back 5 reps;Limitations  W Back Limitations 1 UE at a time with PT faciliation  Shoulder Extension 5 reps  Other Prone Exercise Prone on elbows: cervical rotation x3 reps Bil; Flexion-Extension: 5 reps; scapula retraction x10 reps w/PT facilaition for all  Other Prone Exercise Shoulder flexion: BUE x5 reps; Shoulder abduction: 5 reps     Manual Therapy Manual Therapy: Joint mobilization Joint Mobilization: Prone: Grade II-III to PA C7-T4 with soft tissue massage to scapular region after to decrease pain and improve mobility.  Supine: Manual cervical traction. Grade II-III to C3-C6 PA joint mobs to spinous process and AP joint mobs to C3-C5 Rt facet.  Cervical rotation movement with mobilization with soft tissue massage after. Lt scapular grade III joint mobs in all directions Other: PROM to Lt shoulder : ER, flexion, and abduction in supine Cryotherapy Cryotherapy Location: Shoulder Time 10  minutes Ice pack Supine position  Physical Therapy Assessment and Plan PT Assessment and Plan Clinical Impression Statement: Pt continues to have pain to RUE and demonstrates greatest muscle spasms and decreased shoulder and cervical range with prone exercises to LUE and requires most cueing and PT faciliation to complete activities correctly.  Pt had a decrease of 50% of muscle spasms with improved shoulder ER AROM at end of treatment. PT Plan: Improve shoulder strength with proximal shoulder strengthening (flexion, arm circles (clockwise and counter clockwise), wall washing) Continue with manual techniques to improve shoulder motion and decreased cervical pain.   May add traction if radicular pain continues into Rt UE.    Goals    Problem List Patient Active Problem List   Diagnosis Date Noted  . Cervical radiculopathy 09/08/2013  . Weakness of right upper extremity 09/08/2013  . Moderate persistent asthma do to atopic features 05/02/2012  . GERD (gastroesophageal reflux disease) 11/23/2011  . Irritable bowel syndrome 09/03/2011  . DIARRHEA 01/25/2011  . ADENOMATOUS COLONIC POLYP 11/21/2010  . CONSTIPATION 11/21/2010  . ROTATOR CUFF SYNDROME, LEFT 11/21/2010  . ABDOMINAL PAIN 11/21/2010  . CARCINOMA, BREAST, HX OF 11/21/2010  . SHOULDER, ARTHRITIS, DEGEN./OSTEO 03/30/2010  . RUPTURE ROTATOR CUFF 03/30/2010    PT - End of Session Activity Tolerance: Patient tolerated treatment well General Behavior During Therapy: WFL for tasks assessed/performed  GP Functional Assessment Tool Used: NDI: 30% was 50% inital eval  Ann Fuller, MPT, ATC 10/21/2013, 4:59 PM

## 2013-10-23 ENCOUNTER — Ambulatory Visit
Admission: RE | Admit: 2013-10-23 | Discharge: 2013-10-23 | Disposition: A | Discharge status: Home / Self Care | Payer: BC Managed Care – PPO | Source: Ambulatory Visit

## 2013-10-23 DIAGNOSIS — Z1231 Encounter for screening mammogram for malignant neoplasm of breast: Secondary | ICD-10-CM

## 2013-11-05 ENCOUNTER — Telehealth (HOSPITAL_COMMUNITY): Payer: Self-pay

## 2013-11-05 ENCOUNTER — Ambulatory Visit (HOSPITAL_COMMUNITY): Payer: BC Managed Care – PPO

## 2013-11-10 ENCOUNTER — Ambulatory Visit (HOSPITAL_COMMUNITY)
Admission: RE | Admit: 2013-11-10 | Discharge: 2013-11-10 | Disposition: A | Discharge status: Home / Self Care | Payer: BC Managed Care – PPO | Source: Ambulatory Visit | Attending: Physical Medicine and Rehabilitation | Admitting: Physical Medicine and Rehabilitation

## 2013-11-10 DIAGNOSIS — M5412 Radiculopathy, cervical region: Secondary | ICD-10-CM

## 2013-11-10 DIAGNOSIS — IMO0001 Reserved for inherently not codable concepts without codable children: Secondary | ICD-10-CM | POA: Insufficient documentation

## 2013-11-10 DIAGNOSIS — E119 Type 2 diabetes mellitus without complications: Secondary | ICD-10-CM | POA: Insufficient documentation

## 2013-11-10 DIAGNOSIS — M542 Cervicalgia: Secondary | ICD-10-CM | POA: Insufficient documentation

## 2013-11-10 DIAGNOSIS — I10 Essential (primary) hypertension: Secondary | ICD-10-CM | POA: Insufficient documentation

## 2013-11-10 DIAGNOSIS — R29898 Other symptoms and signs involving the musculoskeletal system: Secondary | ICD-10-CM

## 2013-11-10 NOTE — Progress Notes (Signed)
Physical Therapy Treatment Patient Details  Name: Ann Fuller MRN: [id number] Date of Birth: March 10, 1946  Today's Date: 11/10/2013 Time: 4098-1191 PT Time Calculation (min): 53 min Charges:  TE: 4782-9562 Manual: 1535-1600 Ice: 1600-1610 Visit#: 13 of 17  Re-eval: 11/24/13 Assessment Diagnosis: Cervical radiculopathy Surgical Date: 03/24/13 Next MD Visit: Dr. Chong Sicilian - , Dr Faythe Ghee January  Authorization: [facility] Medicare  Authorization Time Period:    Authorization Visit#: 13 of 20   Subjective: Symptoms/Limitations Symptoms: Pt has not been to therapy since 10/29.  She is feeling pretty good.  Went to MD today and approved 4 more visits of PT and is happy with her progress. She continues to be awakened at 4 am.  She has been doing her exercises at home. She was not able to come last week due to DMII complaications w/glucose dropping to 45.  Pain Assessment Currently in Pain?: No/denies  Precautions/Restrictions  Precautions Precaution Comments: Hx of Breast Cancer  Exercise/Treatments Machines for Strengthening UBE (Upper Arm Bike): 6 min backwards 2.0 for postural strengthening Standing Exercises Upper Extremity Flexion with Stabilization: Flexion;15 reps Other Standing Exercises: Corner Elbow Presses 10x10 sec holds Other Standing Exercises: Shoulder ER on wall x10 reps Seated Exercises X to V: Limitations X to V Limitations: 12 reps PT faciliation W Back: Limitations W Back Limitations: 12 reps PT faicilation  Manual Therapy Joint Mobilization: Rt and Lt S/L scapular mobs all directions  Myofascial Release: Rt and Lt S/L to Lt and Rt subscapularis and teres major/minor to decrease trigger points Cryotherapy Number Minutes Cryotherapy: 10 Minutes Cryotherapy Location: Shoulder Type of Cryotherapy: Ice pack  Physical Therapy Assessment and Plan PT Assessment and Plan Clinical Impression Statement: Pt returns after 2 weeks and is doing well overall,  continues to have pain only at 4 am.  She continues to have most pain to Rt deltoid region and is most limited by Lt shoulder ROM and fascial restrictions.  PT Plan: Improve shoulder strength with proximal shoulder strengthening (flexion, arm circles (clockwise and counter clockwise), wall washing) Continue with manual techniques to improve shoulder motion and decreased cervical pain.   May add traction if radicular pain continues into Rt UE.    Goals Home Exercise Program Pt/caregiver will Perform Home Exercise Program: Independently PT Goal: Perform Home Exercise Program - Progress: Met PT Short Term Goals Time to Complete Short Term Goals: 3 weeks PT Short Term Goal 1: Pt will report pain to her RUE less than 5/10during the night time hours and a 50% reducation in radicular symptoms.  (pain to cervical spine 8/10) PT Short Term Goal 1 - Progress: Progressing toward goal PT Short Term Goal 2: Pt will improve her cervical and scapular stabalizers by 1 muscle grade for greater ease with brushing her hair.  PT Short Term Goal 2 - Progress: Progressing toward goal PT Short Term Goal 3: Pt will improve her Rt shoulder IR and be able to place it behind her back to waist level in order to complete necessary ADL's.  PT Short Term Goal 3 - Progress: Met PT Short Term Goal 4: Pt will be educated on proper lymphedema massage to cervical region.  PT Short Term Goal 4 - Progress: Met PT Long Term Goals Time to Complete Long Term Goals: 8 weeks PT Long Term Goal 1: Pt will improve her cervical and scapula stabalizer strength to [facility] in order to sit with approprriate posture x10 minutes in order to decrease radicular symptoms to her RUE,  in order to progress  with greater ease of sitting in a car.   PT Long Term Goal 1 - Progress: Progressing toward goal PT Long Term Goal 2: Pt will improve her DASH to less than 40% for improved percieved functional ability.  (50%) PT Long Term Goal 2 - Progress: Progressing  toward goal Long Term Goal 3: Pt will improve her cervical AROM for greater ease with turning her head while driving in a car.  Long Term Goal 3 Progress: Progressing toward goal  Problem List Patient Active Problem List   Diagnosis Date Noted  . Cervical radiculopathy 09/08/2013  . Weakness of right upper extremity 09/08/2013  . Moderate persistent asthma do to atopic features 05/02/2012  . GERD (gastroesophageal reflux disease) 11/23/2011  . Irritable bowel syndrome 09/03/2011  . DIARRHEA 01/25/2011  . ADENOMATOUS COLONIC POLYP 11/21/2010  . CONSTIPATION 11/21/2010  . ROTATOR CUFF SYNDROME, LEFT 11/21/2010  . ABDOMINAL PAIN 11/21/2010  . CARCINOMA, BREAST, HX OF 11/21/2010  . SHOULDER, ARTHRITIS, DEGEN./OSTEO 03/30/2010  . RUPTURE ROTATOR CUFF 03/30/2010    PT - End of Session Activity Tolerance: Patient tolerated treatment well General Behavior During Therapy: WFL for tasks assessed/performed  GP Functional Assessment Tool Used: NDI: 30% was 50% inital eval  Blue Winther, MPT, ATC 11/10/2013, 4:29 PM

## 2013-11-16 ENCOUNTER — Ambulatory Visit (HOSPITAL_COMMUNITY)
Admission: RE | Admit: 2013-11-16 | Discharge: 2013-11-16 | Disposition: A | Discharge status: Home / Self Care | Payer: BC Managed Care – PPO | Source: Ambulatory Visit | Attending: Internal Medicine | Admitting: Internal Medicine

## 2013-11-16 DIAGNOSIS — R29898 Other symptoms and signs involving the musculoskeletal system: Secondary | ICD-10-CM

## 2013-11-16 DIAGNOSIS — M5412 Radiculopathy, cervical region: Secondary | ICD-10-CM

## 2013-11-16 NOTE — Progress Notes (Signed)
Physical Therapy Treatment Patient Details  Name: Ann Fuller MRN: [id number] Date of Birth: 1946-11-29  Today's Date: 11/16/2013 Time: 1525-1610 PT Time Calculation (min): 45 min Charge: TE 6295-2841, Manual 1548-1600, Ice 1600-1608  Visit#: 14 of 17  Re-eval: 11/24/13 Assessment Diagnosis: Cervical radiculopathy Surgical Date: 03/24/13 Next MD Visit: Dr. Chong Sicilian - , Dr Faythe Ghee January  Authorization: [facility] Medicare  Authorization Time Period:    Authorization Visit#: 14 of 20   Subjective: Symptoms/Limitations Symptoms: Pt reported Rt shoulder pain last night, no current pain today. Pain Assessment Currently in Pain?: No/denies  Precautions/Restrictions  Precautions Precaution Comments: Hx of Breast Cancer  Exercise/Treatments Stretches Corner Stretch: 3 reps;30 seconds Machines for Strengthening UBE (Upper Arm Bike): 6 min backwards 2.0 for postural strengthening Standing Exercises Upper Extremity Flexion with Stabilization: Flexion;15 reps Seated Exercises X to V: Limitations X to V Limitations: 12 reps PT faciliation   ROM / Strengthening / Isometric Strengthening UBE (Upper Arm Bike): 6 minutes backwards Wall Wash: 2 rminute each clockwise and countclockwise Over Head Lace: 1Reo   Modalities Modalities: Cryotherapy Manual Therapy Myofascial Release: Rt arm focusing on upper trapezius, deltoid and biceps brachii to reduce trigger points, spamss and pain Cryotherapy Number Minutes Cryotherapy: 8 Minutes Cryotherapy Location: Shoulder Type of Cryotherapy: Ice pack  Physical Therapy Assessment and Plan PT Assessment and Plan Clinical Impression Statement: Added standing shoulder flexion exercise for strengthening and activity training, pt limited by fatigue with new activity and reported pain increase to Rt deltoid and biceps brachii region.  Manual techniques complete to reduce trigger points pain, spasms and pain overall.  Ended session with ice for  pain control. PT Plan: Improve shoulder strength with proximal shoulder strengthening (flexion, arm circles (clockwise and counter clockwise), wall washing) Continue with manual techniques to improve shoulder motion and decreased cervical pain.   May add traction if radicular pain continues into Rt UE.    Goals Home Exercise Program Pt/caregiver will Perform Home Exercise Program: Independently PT Short Term Goals Time to Complete Short Term Goals: 3 weeks PT Short Term Goal 1: Pt will report pain to her RUE less than 5/10during the night time hours and a 50% reducation in radicular symptoms.  (pain to cervical spine 8/10) PT Short Term Goal 2: Pt will improve her cervical and scapular stabalizers by 1 muscle grade for greater ease with brushing her hair.  PT Short Term Goal 2 - Progress: Progressing toward goal PT Short Term Goal 3: Pt will improve her Rt shoulder IR and be able to place it behind her back to waist level in order to complete necessary ADL's.  PT Short Term Goal 4: Pt will be educated on proper lymphedema massage to cervical region.  PT Long Term Goals Time to Complete Long Term Goals: 8 weeks PT Long Term Goal 1: Pt will improve her cervical and scapula stabalizer strength to [facility] in order to sit with approprriate posture x10 minutes in order to decrease radicular symptoms to her RUE,  in order to progress with greater ease of sitting in a car.   PT Long Term Goal 1 - Progress: Progressing toward goal PT Long Term Goal 2: Pt will improve her DASH to less than 40% for improved percieved functional ability.  (50%) Long Term Goal 3: Pt will improve her cervical AROM for greater ease with turning her head while driving in a car.   Problem List Patient Active Problem List   Diagnosis Date Noted  . Cervical radiculopathy 09/08/2013  .  Weakness of right upper extremity 09/08/2013  . Moderate persistent asthma do to atopic features 05/02/2012  . GERD (gastroesophageal reflux  disease) 11/23/2011  . Irritable bowel syndrome 09/03/2011  . DIARRHEA 01/25/2011  . ADENOMATOUS COLONIC POLYP 11/21/2010  . CONSTIPATION 11/21/2010  . ROTATOR CUFF SYNDROME, LEFT 11/21/2010  . ABDOMINAL PAIN 11/21/2010  . CARCINOMA, BREAST, HX OF 11/21/2010  . SHOULDER, ARTHRITIS, DEGEN./OSTEO 03/30/2010  . RUPTURE ROTATOR CUFF 03/30/2010    PT - End of Session Activity Tolerance: Patient tolerated treatment well General Behavior During Therapy: [facility] for tasks assessed/performed  GP    Juel Burrow 11/16/2013, 4:12 PM

## 2013-11-18 ENCOUNTER — Ambulatory Visit (HOSPITAL_COMMUNITY): Payer: BC Managed Care – PPO | Admitting: Physical Therapy

## 2013-11-18 ENCOUNTER — Telehealth (HOSPITAL_COMMUNITY): Payer: Self-pay

## 2013-12-04 ENCOUNTER — Telehealth: Payer: Self-pay | Admitting: Critical Care Medicine

## 2013-12-04 NOTE — Telephone Encounter (Signed)
lmomtcb x1 for pt 

## 2013-12-07 NOTE — Telephone Encounter (Signed)
LMTCBx2 on both contact #'s. Carron Curie, CMA

## 2013-12-07 NOTE — Telephone Encounter (Signed)
Patient returning call stating she is still coughing and congested.  Requesting rx.  [facility]  [phone] or [phone]

## 2013-12-07 NOTE — Telephone Encounter (Signed)
Spoke with pt  She c/o congestion and cough x 1 wk  Cough is prod with minimal yellow sputum  Taking Nyquil for cough  Voice is hoarse   Would like something called to pharm  Please advise thanks! Allergies  Allergen Reactions  . Ceftin [Cefuroxime Axetil] Swelling  . Codeine Swelling  . Metformin And Related Diarrhea  . Nexium [Esomeprazole Magnesium] Diarrhea  . Omnicef [Cefdinir] Diarrhea  . Sulfa Antibiotics Rash

## 2013-12-07 NOTE — Telephone Encounter (Signed)
Call in azithromycin 250mg Take two once then one daily until gone #6 

## 2013-12-08 MED ORDER — AZITHROMYCIN 250 MG PO TABS: ORAL_TABLET | ORAL | Status: AC

## 2013-12-08 NOTE — Telephone Encounter (Signed)
Pt is aware that abx will be sent in.

## 2014-01-05 ENCOUNTER — Other Ambulatory Visit: Payer: Self-pay | Admitting: Critical Care Medicine

## 2014-01-07 ENCOUNTER — Telehealth: Payer: Self-pay | Admitting: Critical Care Medicine

## 2014-01-07 NOTE — Telephone Encounter (Signed)
Gave verbal to pharmacist for 200mg  1 q 4hr prn.

## 2014-01-11 ENCOUNTER — Telehealth: Payer: Self-pay | Admitting: Critical Care Medicine

## 2014-01-11 ENCOUNTER — Encounter: Payer: Self-pay | Admitting: Critical Care Medicine

## 2014-01-11 ENCOUNTER — Ambulatory Visit (INDEPENDENT_AMBULATORY_CARE_PROVIDER_SITE_OTHER): Payer: BC Managed Care – PPO | Admitting: Critical Care Medicine

## 2014-01-11 VITALS — BP 138/70 | HR 83 | Temp 97.6°F | Ht 59.0 in | Wt 237.8 lb

## 2014-01-11 DIAGNOSIS — J45909 Unspecified asthma, uncomplicated: Secondary | ICD-10-CM

## 2014-01-11 MED ORDER — BUDESONIDE-FORMOTEROL FUMARATE 160-4.5 MCG/ACT IN AERO: 2.0000 | INHALATION_SPRAY | Freq: Two times a day (BID) | RESPIRATORY_TRACT | Status: DC

## 2014-01-11 MED ORDER — MOMETASONE FURO-FORMOTEROL FUM 200-5 MCG/ACT IN AERO: 2.0000 | INHALATION_SPRAY | Freq: Two times a day (BID) | RESPIRATORY_TRACT | Status: DC

## 2014-01-11 MED ORDER — AZITHROMYCIN 250 MG PO TABS: 250.0000 mg | ORAL_TABLET | Freq: Every day | ORAL | Status: DC

## 2014-01-11 NOTE — Progress Notes (Signed)
Subjective:    Patient ID: Ann Fuller, female    DOB: Apr 20, 1946, 68 y.o.   MRN: [id number]  HPI  68 yo female with known hx of cyclical cough and RAD     PUL ASTHMA HISTORY 01/11/2014 07/14/2013 05/25/2013 11/12/2012  Symptoms Daily 0-2 days/week Daily 0-2 days/week  Nighttime awakenings 0-2/month 0-2/month Often--7/wk 0-2/month  Interference with activity Some limitations No limitations Minor limitations No limitations  SABA use 0-2 days/wk 0-2 days/wk Several times/day 0-2 days/wk  Exacerbations requiring oral steroids 0-1 / year 0-1 / year 0-1 / year 0-1 / year   01/11/2014 Chief Complaint  Patient presents with  . Follow-up    c/o wheezing and prod cough with yellow mucus x 2 wks.  No increased SOB, chest tightness/pain, or f/c/s.  Notes wheezing and cough for two weeks.  Zpak rx before holidays, then cough came back.  Now is yellow mucus.  Notes more wheezing , notes more dyspnea with exertion. No sinus issues   Review of Systems  Constitutional:   No  weight loss, night sweats,  Fevers, chills, fatigue, or  lassitude.  HEENT:   No headaches,  Difficulty swallowing,  Tooth/dental problems, or  Sore throat,                No sneezing, itching,  n0 ear ache, nasal congestion, post nasal drip,   CV:  No chest pain,  Orthopnea, PND, swelling in lower extremities, anasarca, dizziness, palpitations, syncope.   GI  No heartburn, indigestion, abdominal pain, nausea, vomiting, diarrhea, change in bowel habits, loss of appetite, bloody stools.   Resp:  No coughing up of blood.    No chest wall deformity  Skin: no rash or lesions.  GU: no dysuria, change in color of urine, no urgency or frequency.  No flank pain, no hematuria   MS:  No joint pain or swelling.  No decreased range of motion.  No back pain.  Psych:  No change in mood or affect. No depression or anxiety.  No memory loss.         Objective:   Physical Exam BP 138/70  Pulse 83  Temp(Src) 97.6 F  (36.4 C) (Oral)  Ht 4\' 11"  (1.499 m)  Wt 237 lb 12.8 oz (107.865 kg)  BMI 48.00 kg/m2  SpO2 94%   GEN: A/Ox3; pleasant , NAD, well nourished   HEENT:  [place]/AT,  EACs-clear, TMs-wnl, NOSE-clear drainage , THROAT-clear, no lesions, no postnasal drip or exudate noted.   NECK:  Supple w/ fair ROM; no JVD; normal carotid impulses w/o bruits; no thyromegaly or nodules palpated; no lymphadenopathy.  RESP ++expired wheezes  CARD:  RRR, no m/r/g  , no peripheral edema, pulses intact, no cyanosis or clubbing.  GI:   Soft & nt; nml bowel sounds; no organomegaly or masses detected.  Musco: Warm bil, no deformities or joint swelling noted.   Neuro: alert, no focal deficits noted.    Skin: Warm, no lesions or rashes        Assessment & Plan:   Moderate persistent asthma do to atopic features Moderate persistent asthma with atopic features and associated cyclical cough now with mild exacerbation due to tracheobronchitis also associated reflux and allergic rhinitis Plan Maintain Dulera daily add azithromycin for 5 days for mild tracheobronchitis  Updated Medication List Outpatient Encounter Prescriptions as of 01/11/2014  Medication Sig  . albuterol (VENTOLIN HFA) 108 (90 BASE) MCG/ACT inhaler Inhale 2 puffs into the lungs every 6 (six) hours as  needed. Shortness of Breath  . amLODipine (NORVASC) 5 MG tablet Take 5 mg by mouth daily.    Marland Kitchen aspirin 81 MG tablet Take 81 mg by mouth daily.  Marland Kitchen atorvastatin (LIPITOR) 40 MG tablet Take 40 mg by mouth daily.    . benzonatate (TESSALON) 100 MG capsule TAKE 1 OR 2 CAPSULES EVERY 4 HOURS AS NEEDED FOR COUGH  . carvedilol (COREG) 6.25 MG tablet Take 6.25 mg by mouth 2 (two) times daily with a meal.  . chlorthalidone (HYGROTON) 25 MG tablet Take 25 mg by mouth daily.    Marland Kitchen dexlansoprazole (DEXILANT) 60 MG capsule Take 60 mg by mouth daily.   Marland Kitchen dicyclomine (BENTYL) 10 MG capsule Take 10 mg by mouth 4 (four) times daily as needed. As needed for  irritable bowel.  Marland Kitchen exenatide (BYETTA 10 MCG PEN) 10 MCG/0.04ML SOLN Inject 10 mcg into the skin 2 (two) times daily with a meal.   . HYDROcodone-acetaminophen (NORCO/VICODIN) 5-325 MG per tablet Take 1 tablet by mouth every 4 (four) hours as needed for pain.  . Insulin Aspart (NOVOLOG FLEXPEN [place]) 10 units twice daily before meals  . Insulin Glargine (LANTUS SOLOSTAR [place]) 55 units every morning before breakfast  . LORazepam (ATIVAN) 1 MG tablet Take 1 mg by mouth 2 (two) times daily as needed. Anxiety/Sleep  . losartan (COZAAR) 100 MG tablet Take 100 mg by mouth daily.    . meloxicam (MOBIC) 15 MG tablet Take 15 mg by mouth daily.  . mometasone-formoterol (DULERA) 200-5 MCG/ACT AERO Inhale 2 puffs into the lungs 2 (two) times daily.  . temazepam (RESTORIL) 15 MG capsule Take 15 mg by mouth at bedtime as needed. Sleep  . [DISCONTINUED] mometasone-formoterol (DULERA) 200-5 MCG/ACT AERO Inhale 2 puffs into the lungs 2 (two) times daily.  Marland Kitchen azithromycin (ZITHROMAX) 250 MG tablet Take 1 tablet (250 mg total) by mouth daily. Take two once then one daily until gone

## 2014-01-11 NOTE — Telephone Encounter (Signed)
i am ok with change to symbicort 160 two puff bid

## 2014-01-11 NOTE — Patient Instructions (Signed)
Stay on dulera two puff twice daily A azithromycin RX sent to pharmacy Return 6 months

## 2014-01-11 NOTE — Telephone Encounter (Signed)
Spoke with pharmacist at [facility]. Ruthe Mannan is not covered on pt's plan, PA is not required. Insurance will cover Symbicort and Advair.  PW - please advise. Thanks.

## 2014-01-11 NOTE — Telephone Encounter (Signed)
New inhaler has been sent in.

## 2014-01-12 NOTE — Assessment & Plan Note (Addendum)
Moderate persistent asthma with atopic features and associated cyclical cough now improved also associated reflux and allergic rhinitis Plan Maintain Dulera daily

## 2014-01-26 ENCOUNTER — Ambulatory Visit (HOSPITAL_COMMUNITY)
Admission: RE | Admit: 2014-01-26 | Discharge: 2014-01-26 | Disposition: A | Discharge status: Home / Self Care | Payer: BC Managed Care – PPO | Source: Ambulatory Visit | Attending: Internal Medicine | Admitting: Internal Medicine

## 2014-01-26 DIAGNOSIS — R55 Syncope and collapse: Secondary | ICD-10-CM

## 2014-01-26 DIAGNOSIS — R42 Dizziness and giddiness: Secondary | ICD-10-CM | POA: Insufficient documentation

## 2014-01-26 NOTE — Progress Notes (Signed)
48 Hour Holter Monitor in progress. 

## 2014-01-28 ENCOUNTER — Other Ambulatory Visit: Payer: Self-pay

## 2014-01-28 ENCOUNTER — Other Ambulatory Visit: Payer: Self-pay | Admitting: *Deleted

## 2014-01-28 DIAGNOSIS — R55 Syncope and collapse: Secondary | ICD-10-CM

## 2014-02-09 ENCOUNTER — Ambulatory Visit: Payer: BC Managed Care – PPO | Admitting: Cardiovascular Disease

## 2014-03-03 ENCOUNTER — Ambulatory Visit: Payer: BC Managed Care – PPO | Admitting: Cardiology

## 2014-03-05 DIAGNOSIS — L919 Hypertrophic disorder of the skin, unspecified: Secondary | ICD-10-CM

## 2014-03-05 DIAGNOSIS — E785 Hyperlipidemia, unspecified: Secondary | ICD-10-CM | POA: Insufficient documentation

## 2014-03-05 DIAGNOSIS — L909 Atrophic disorder of skin, unspecified: Secondary | ICD-10-CM | POA: Insufficient documentation

## 2014-03-05 DIAGNOSIS — E119 Type 2 diabetes mellitus without complications: Secondary | ICD-10-CM | POA: Insufficient documentation

## 2014-03-05 DIAGNOSIS — J40 Bronchitis, not specified as acute or chronic: Secondary | ICD-10-CM

## 2014-03-05 DIAGNOSIS — I10 Essential (primary) hypertension: Secondary | ICD-10-CM

## 2014-03-08 ENCOUNTER — Ambulatory Visit (INDEPENDENT_AMBULATORY_CARE_PROVIDER_SITE_OTHER): Payer: BC Managed Care – PPO | Admitting: Cardiology

## 2014-03-08 ENCOUNTER — Encounter: Payer: Self-pay | Admitting: Cardiology

## 2014-03-08 VITALS — BP 142/61 | HR 90 | Ht 59.0 in | Wt 236.0 lb

## 2014-03-08 DIAGNOSIS — R011 Cardiac murmur, unspecified: Secondary | ICD-10-CM | POA: Insufficient documentation

## 2014-03-08 DIAGNOSIS — I10 Essential (primary) hypertension: Secondary | ICD-10-CM

## 2014-03-08 DIAGNOSIS — R55 Syncope and collapse: Secondary | ICD-10-CM

## 2014-03-08 NOTE — Patient Instructions (Addendum)
Your physician recommends that you schedule a follow-up appointment to be determined.We will call you with echocardiogram results  Your physician recommends that you continue on your current medications as directed. Please refer to the Current Medication list given to you today.   Your physician has requested that you have an echocardiogram. Echocardiography is a painless test that uses sound waves to create images of your heart. It provides your doctor with information about the size and shape of your heart and how well your heart's chambers and valves are working. This procedure takes approximately one hour. There are no restrictions for this procedure.    Thank you for choosing [facility] !

## 2014-03-08 NOTE — Assessment & Plan Note (Signed)
She continues on Norvasc and Cozaar. Keep followup with Dr. Willey Blade.

## 2014-03-08 NOTE — Assessment & Plan Note (Signed)
Doubt that this is specifically related to the events described, however cardiac structure and valvular status should be objectively defined based on her examination. Echocardiogram is ordered.

## 2014-03-08 NOTE — Assessment & Plan Note (Signed)
Episodes of near syncope as outlined without frank syncope based on her description today. This has not been recurrent, question whether this was potentially related to Coreg, although not certain. Cardiac monitoring occurred after she stopped Coreg, she had no significant bradycardic or tachyarrhythmic events. No further workup at this time other than an echocardiogram in light of her cardiac murmur. Certainly, if she has recurrences, we may need to consider additional evaluation, a least a longer-term monitor.

## 2014-03-08 NOTE — Progress Notes (Signed)
Clinical Summary [name] is a 68 y.o.female referred for cardiology consultation by Dr. Willey Fuller. She reports a history of near syncopal events. She tells me that these occurred back in 01/16/23, both times that she can recall happened when she was already lying down and felt "weak and drained." She tried to sit up and had to lie back down. At neither time did she actually lose consciousness. She denies that she has ever had any syncope while standing. She does tell me that she was under a lot of stress in 16-Jan-2023, her sister passed away that month.  She was taken off Coreg by Dr. Willey Fuller and has had no further events. Holter monitor showed sinus rhythm ranging from 54-108 beats per minute, occasional PACs and PVCs, rare fusion beats and one ventricular couplet, no sustained arrhythmias or pauses. Recent ECG showed sinus rhythm with probable left atrial enlargement.  Recent lab work from February showed hemoglobin 12.2, platelets 369, potassium 4.0, BUN 16, creatinine 0.6, normal LFTs, cholesterol 198, triglycerides 122, HDL 57, LDL 117, hemoglobin A1c 6.6.  She reports no functional limitation related to chest pain or breathlessness. She is limited by arthritis and lower back pain. She does have a family history of CAD, but no personal history with negative Myoview in 2006.   Allergies  Allergen Reactions  . Ceftin [Cefuroxime Axetil] Swelling  . Codeine Swelling  . Metformin And Related Diarrhea  . Nexium [Esomeprazole Magnesium] Diarrhea  . Omnicef [Cefdinir] Diarrhea  . Sulfa Antibiotics Rash    Current Outpatient Prescriptions  Medication Sig Dispense Refill  . albuterol (VENTOLIN HFA) 108 (90 BASE) MCG/ACT inhaler Inhale 2 puffs into the lungs every 6 (six) hours as needed. Shortness of Breath      . amLODipine (NORVASC) 5 MG tablet Take 5 mg by mouth daily.        Marland Kitchen aspirin 81 MG tablet Take 81 mg by mouth daily.      Marland Kitchen atorvastatin (LIPITOR) 40 MG tablet Take 40 mg by mouth  daily.        . benzonatate (TESSALON) 100 MG capsule TAKE 1 OR 2 CAPSULES EVERY 4 HOURS AS NEEDED FOR COUGH  90 capsule  0  . budesonide-formoterol (SYMBICORT) 160-4.5 MCG/ACT inhaler Inhale 2 puffs into the lungs 2 (two) times daily.  1 Inhaler  5  . chlorthalidone (HYGROTON) 25 MG tablet Take 25 mg by mouth daily.        . cyclobenzaprine (FLEXERIL) 10 MG tablet Take 10 mg by mouth 2 (two) times daily as needed for muscle spasms.      Marland Kitchen dexlansoprazole (DEXILANT) 60 MG capsule Take 60 mg by mouth daily.       . diclofenac (VOLTAREN) 75 MG EC tablet Take 75 mg by mouth 2 (two) times daily.      Marland Kitchen exenatide (BYETTA 10 MCG PEN) 10 MCG/0.04ML SOLN Inject 10 mcg into the skin 2 (two) times daily with a meal.       . HYDROcodone-acetaminophen (NORCO/VICODIN) 5-325 MG per tablet Take 1 tablet by mouth every 4 (four) hours as needed for pain.      . Insulin Aspart (NOVOLOG FLEXPEN [place]) 10 units twice daily before meals      . Insulin Glargine (LANTUS SOLOSTAR [place]) 55 units every morning before breakfast      . LORazepam (ATIVAN) 1 MG tablet Take 1 mg by mouth 2 (two) times daily as needed. Anxiety/Sleep      . losartan (COZAAR) 100 MG  tablet Take 100 mg by mouth daily.        . mometasone-formoterol (DULERA) 200-5 MCG/ACT AERO Inhale 2 puffs into the lungs 2 (two) times daily.  1 Inhaler  11  . temazepam (RESTORIL) 15 MG capsule Take 15 mg by mouth at bedtime as needed. Sleep       No current facility-administered medications for this visit.   Facility-Administered Medications Ordered in Other Visits  Medication Dose Route Frequency Provider Last Rate Last Dose  . lactated ringers infusion    Continuous PRN Ann Shin, CRNA        Past Medical History  Diagnosis Date  . Essential hypertension, benign   . Type 2 diabetes mellitus   . Adenomatous colon polyp   . Diverticulitis   . Internal hemorrhoid   . IBS (irritable bowel syndrome)   . GERD (gastroesophageal reflux disease)   .  Dyspepsia   . Hiatal hernia   . Tubular adenoma 11/2010  . Bulging lumbar disc   . PONV (postoperative nausea and vomiting)   . Asthma   . Arthritis   . Breast cancer 1990;1995  . Right shoulder pain     Past Surgical History  Procedure Laterality Date  . Cholecystectomy    . Tubal ligation    . Esophagogastroduodenoscopy  12/13/2010    Dr. Jennet Fuller hernia, fundal gland type polyps  . Colonoscopy  12/13/2010    Dr. Margarito Fuller pancolonic diverticular.tubular adenoma  . Colonoscopy  07/13/2003  . Appendectomy    . Mastectomy  1995  . Back surgery  2007    Lumbar fusion  . Tonsillectomy    . Abdominal hysterectomy    . Cataract extraction w/phaco Left 08/27/2013    Procedure: CATARACT EXTRACTION PHACO AND INTRAOCULAR LENS PLACEMENT (IOC);  Surgeon: Ann Branch, MD;  Location: [facility];  Service: Ophthalmology;  Laterality: Left;  CDE 4.25  . Eye surgery Left     Left KPE 08/27/13  . Cataract extraction w/phaco Right 09/24/2013    Procedure: CATARACT EXTRACTION PHACO AND INTRAOCULAR LENS PLACEMENT (IOC);  Surgeon: Ann Branch, MD;  Location: [facility];  Service: Ophthalmology;  Laterality: Right;  CDE:  8.16    Family History  Problem Relation Age of Onset  . Colon cancer Mother   . Heart attack Father   . Heart attack Sister   . Stroke Sister   . Cancer - Lung Sister     Social History Ann Fuller reports that she has never smoked. She has never used smokeless tobacco. Ann Fuller reports that she does not drink alcohol.  Review of Systems No palpitations, no orthopnea or PND. Otherwise as outlined above.  Physical Examination Filed Vitals:   03/08/14 1326  BP: 142/61  Pulse: 90   Filed Weights   03/08/14 1326  Weight: 236 lb (107.049 kg)   Obese woman, appears comfortable at rest. HEENT: Conjunctiva and lids normal, oropharynx clear. Neck: Supple, no elevated JVP or carotid bruits, no thyromegaly. Lungs: Clear to auscultation, nonlabored breathing at  rest. Cardiac: Regular rate and rhythm, no S3, 1-6/1 systolic murmur at base with possible radiation to the left neck, no pericardial rub. Abdomen: Soft, nontender, bowel sounds present, no guarding or rebound. Extremities: No pitting edema, distal pulses 2+. Skin: Warm and dry. Musculoskeletal: No kyphosis. Neuropsychiatric: Alert and oriented x3, affect grossly appropriate.   Problem List and Plan   Syncope Episodes of near syncope as outlined without frank syncope based on her description today. This has not been  recurrent, question whether this was potentially related to Coreg, although not certain. Cardiac monitoring occurred after she stopped Coreg, she had no significant bradycardic or tachyarrhythmic events. No further workup at this time other than an echocardiogram in light of her cardiac murmur. Certainly, if she has recurrences, we may need to consider additional evaluation, a least a longer-term monitor.  Murmur, cardiac Doubt that this is specifically related to the events described, however cardiac structure and valvular status should be objectively defined based on her examination. Echocardiogram is ordered.  Essential hypertension, benign She continues on Norvasc and Cozaar. Keep followup with Dr. Willey Fuller.    Satira Sark, M.D., F.A.C.C.

## 2014-03-15 ENCOUNTER — Ambulatory Visit (HOSPITAL_COMMUNITY): Payer: BC Managed Care – PPO

## 2014-03-17 ENCOUNTER — Ambulatory Visit (HOSPITAL_COMMUNITY)
Admission: RE | Admit: 2014-03-17 | Discharge: 2014-03-17 | Disposition: A | Discharge status: Home / Self Care | Payer: BC Managed Care – PPO | Source: Ambulatory Visit | Attending: Cardiology | Admitting: Cardiology

## 2014-03-17 DIAGNOSIS — R011 Cardiac murmur, unspecified: Secondary | ICD-10-CM | POA: Insufficient documentation

## 2014-03-17 DIAGNOSIS — E119 Type 2 diabetes mellitus without complications: Secondary | ICD-10-CM | POA: Insufficient documentation

## 2014-03-17 DIAGNOSIS — I10 Essential (primary) hypertension: Secondary | ICD-10-CM | POA: Insufficient documentation

## 2014-03-17 DIAGNOSIS — Z6841 Body Mass Index (BMI) 40.0 and over, adult: Secondary | ICD-10-CM | POA: Insufficient documentation

## 2014-03-17 DIAGNOSIS — I359 Nonrheumatic aortic valve disorder, unspecified: Secondary | ICD-10-CM

## 2014-03-17 DIAGNOSIS — R55 Syncope and collapse: Secondary | ICD-10-CM

## 2014-03-17 NOTE — Progress Notes (Signed)
*  PRELIMINARY RESULTS* Echocardiogram 2D Echocardiogram has been performed.  [place], [place] 03/17/2014, 3:22 PM

## 2014-03-18 ENCOUNTER — Telehealth: Payer: Self-pay | Admitting: Cardiology

## 2014-03-18 NOTE — Telephone Encounter (Signed)
Returning call to [facility] for results of Echo / tgs

## 2014-03-31 ENCOUNTER — Telehealth: Payer: Self-pay | Admitting: Critical Care Medicine

## 2014-03-31 MED ORDER — AZITHROMYCIN 250 MG PO TABS: 250.0000 mg | ORAL_TABLET | ORAL | Status: DC

## 2014-03-31 MED ORDER — BENZONATATE 100 MG PO CAPS
ORAL_CAPSULE | ORAL | Status: DC
Start: 2014-03-31 — End: 2014-04-01

## 2014-03-31 NOTE — Telephone Encounter (Signed)
I asked CY about this patient-he suggests we give her Zpak #1 take as directed no refills. Thanks.

## 2014-03-31 NOTE — Telephone Encounter (Signed)
Spoke with the pt and notified of recs per CDY She verbalized understanding and rx was sent to pharm  RF on tessalon sent ok per Dr Annamaria Boots

## 2014-03-31 NOTE — Telephone Encounter (Signed)
Spoke with the pt  She is c/o increased cough x 3 days  Cough is prod with large amounts of yellow sputum  She states that she has also had chills, but unsure of fever  She denies any SOB, chest tightness, CP or other co's  She is taking tessalon pearles and robitussin dm without relief  Please advise, thanks! Allergies  Allergen Reactions  . Ceftin [Cefuroxime Axetil] Swelling  . Codeine Swelling  . Metformin And Related Diarrhea  . Nexium [Esomeprazole Magnesium] Diarrhea  . Omnicef [Cefdinir] Diarrhea  . Sulfa Antibiotics Rash

## 2014-04-01 ENCOUNTER — Telehealth: Payer: Self-pay | Admitting: Internal Medicine

## 2014-04-01 MED ORDER — BENZONATATE 200 MG PO CAPS: 200.0000 mg | ORAL_CAPSULE | ORAL | Status: DC | PRN

## 2014-04-01 NOTE — Telephone Encounter (Signed)
Yes this is ok 

## 2014-04-01 NOTE — Telephone Encounter (Signed)
Called and gave VO to [employer]. Nothing further needed

## 2014-04-01 NOTE — Telephone Encounter (Signed)
Spoke with [facility]. They received RX for tess pearls 100 mg 1-2 every 4 hrs PRN. Was advised tess 100 mg is on back order. Wants the okay to give tess pearls 200 mg. Please advise Dr. Joya Gaskins thanks

## 2014-04-23 ENCOUNTER — Other Ambulatory Visit: Payer: Self-pay | Admitting: Critical Care Medicine

## 2014-04-27 ENCOUNTER — Encounter (HOSPITAL_COMMUNITY): Payer: Self-pay | Admitting: Emergency Medicine

## 2014-04-27 ENCOUNTER — Emergency Department (HOSPITAL_COMMUNITY)
Admission: EM | Admit: 2014-04-27 | Discharge: 2014-04-27 | Disposition: A | Discharge status: Home / Self Care | Payer: BC Managed Care – PPO | Attending: Emergency Medicine | Admitting: Emergency Medicine

## 2014-04-27 DIAGNOSIS — E119 Type 2 diabetes mellitus without complications: Secondary | ICD-10-CM | POA: Insufficient documentation

## 2014-04-27 DIAGNOSIS — Y92410 Unspecified street and highway as the place of occurrence of the external cause: Secondary | ICD-10-CM | POA: Insufficient documentation

## 2014-04-27 DIAGNOSIS — Z7982 Long term (current) use of aspirin: Secondary | ICD-10-CM | POA: Insufficient documentation

## 2014-04-27 DIAGNOSIS — Z860100 Personal history of colon polyps, unspecified: Secondary | ICD-10-CM | POA: Insufficient documentation

## 2014-04-27 DIAGNOSIS — IMO0002 Reserved for concepts with insufficient information to code with codable children: Secondary | ICD-10-CM | POA: Insufficient documentation

## 2014-04-27 DIAGNOSIS — M129 Arthropathy, unspecified: Secondary | ICD-10-CM | POA: Insufficient documentation

## 2014-04-27 DIAGNOSIS — I10 Essential (primary) hypertension: Secondary | ICD-10-CM | POA: Insufficient documentation

## 2014-04-27 DIAGNOSIS — Y9389 Activity, other specified: Secondary | ICD-10-CM | POA: Insufficient documentation

## 2014-04-27 DIAGNOSIS — S199XXA Unspecified injury of neck, initial encounter: Principal | ICD-10-CM

## 2014-04-27 DIAGNOSIS — K219 Gastro-esophageal reflux disease without esophagitis: Secondary | ICD-10-CM | POA: Insufficient documentation

## 2014-04-27 DIAGNOSIS — J45909 Unspecified asthma, uncomplicated: Secondary | ICD-10-CM | POA: Insufficient documentation

## 2014-04-27 DIAGNOSIS — Z794 Long term (current) use of insulin: Secondary | ICD-10-CM | POA: Insufficient documentation

## 2014-04-27 DIAGNOSIS — Z79899 Other long term (current) drug therapy: Secondary | ICD-10-CM | POA: Insufficient documentation

## 2014-04-27 DIAGNOSIS — S0993XA Unspecified injury of face, initial encounter: Secondary | ICD-10-CM | POA: Insufficient documentation

## 2014-04-27 DIAGNOSIS — Z853 Personal history of malignant neoplasm of breast: Secondary | ICD-10-CM | POA: Insufficient documentation

## 2014-04-27 LAB — CBG MONITORING, ED: GLUCOSE-CAPILLARY: 107 mg/dL — AB (ref 70–99)

## 2014-04-27 MED ORDER — HYDROCODONE-ACETAMINOPHEN 5-325 MG PO TABS: 1.0000 | ORAL_TABLET | ORAL | Status: DC | PRN

## 2014-04-27 MED ORDER — OXYCODONE-ACETAMINOPHEN 5-325 MG PO TABS
1.0000 | ORAL_TABLET | Freq: Once | ORAL | Status: AC
  Administered 2014-04-27: 1 via ORAL
  Filled 2014-04-27: qty 1

## 2014-04-27 MED ORDER — IBUPROFEN 400 MG PO TABS
400.0000 mg | ORAL_TABLET | Freq: Once | ORAL | Status: AC
  Administered 2014-04-27: 400 mg via ORAL
  Filled 2014-04-27: qty 1

## 2014-04-27 MED ORDER — IBUPROFEN 400 MG PO TABS: 400.0000 mg | ORAL_TABLET | Freq: Four times a day (QID) | ORAL | Status: DC | PRN
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
## 2014-05-21 ENCOUNTER — Telehealth: Payer: Self-pay | Admitting: Critical Care Medicine

## 2014-05-21 MED ORDER — PREDNISONE 10 MG PO TABS: ORAL_TABLET | ORAL | Status: DC

## 2014-05-21 MED ORDER — AZITHROMYCIN 250 MG PO TABS: 250.0000 mg | ORAL_TABLET | ORAL | Status: DC

## 2014-05-21 NOTE — Telephone Encounter (Signed)
Prednisone 10 mg take  4 each am x 2 days,   2 each am x 2 days,  1 each am x 2 days and stop zpak 

## 2014-05-21 NOTE — Telephone Encounter (Signed)
Pt calling c/o increased SOB and cough. Pt reports this is the same problem as she had in April. Using inhalers as directed Using tessalon perles as directed Getting no relief from current regimen. Would like recs to help her get through the weekend.   Please advise Dr Melvyn Novas. Thanks.

## 2014-05-21 NOTE — Telephone Encounter (Signed)
Spoke with pt and notified of recs per MW  She verbalized understanding  Nothing further needed 

## 2014-05-21 NOTE — Telephone Encounter (Signed)
lmomtcb x1 

## 2014-05-21 NOTE — Telephone Encounter (Signed)
Pt returning call.Ann Fuller ° °

## 2014-07-13 ENCOUNTER — Other Ambulatory Visit: Payer: Self-pay | Admitting: Gastroenterology

## 2014-07-14 NOTE — Telephone Encounter (Signed)
rf X 1. Patient needs ov prior to further refills. Last seen 04/2012.

## 2014-07-15 ENCOUNTER — Encounter: Payer: Self-pay | Admitting: Internal Medicine

## 2014-07-15 NOTE — Telephone Encounter (Signed)
Mailed letter °

## 2014-07-22 ENCOUNTER — Telehealth: Payer: Self-pay | Admitting: Internal Medicine

## 2014-07-22 NOTE — Telephone Encounter (Signed)
I called pt- advised her that she has not been seen since 04/2012 and that if she thinks she may have diverticulitis, she needs to go to ED to be evaluated or to her PCP. Pt verbalized understanding. She is aware of her upcoming appt.

## 2014-07-22 NOTE — Telephone Encounter (Signed)
Pt called this morning with complaints of diverticulitis, diarrhea and abd pain. She is aware of OV on 9/2 at 2pm, but is there any recommendations we can make to help her before then? Please advise. [phone]

## 2014-08-25 ENCOUNTER — Encounter: Payer: Self-pay | Admitting: Gastroenterology

## 2014-08-25 ENCOUNTER — Ambulatory Visit (INDEPENDENT_AMBULATORY_CARE_PROVIDER_SITE_OTHER): Payer: BC Managed Care – PPO | Admitting: Gastroenterology

## 2014-08-25 ENCOUNTER — Encounter (INDEPENDENT_AMBULATORY_CARE_PROVIDER_SITE_OTHER): Payer: Self-pay

## 2014-08-25 VITALS — BP 147/75 | HR 102 | Temp 98.7°F | Ht 59.0 in | Wt 231.6 lb

## 2014-08-25 DIAGNOSIS — K589 Irritable bowel syndrome without diarrhea: Secondary | ICD-10-CM | POA: Insufficient documentation

## 2014-08-25 DIAGNOSIS — K219 Gastro-esophageal reflux disease without esophagitis: Secondary | ICD-10-CM

## 2014-08-25 NOTE — Assessment & Plan Note (Signed)
Recent flare of left lower quadrant abdominal pain associated with diarrhea, possibly diverticulitis. Treated empirically by PCP. Also at baseline IBS D. Doing well at this time. Symptoms improved after antibiotic therapy. She is back at baseline. Trying to avoid food triggers and stress. Uses Bentyl when necessary with good results.

## 2014-08-25 NOTE — Progress Notes (Signed)
Primary Care Physician:  Asencion Noble, MD  Primary Gastroenterologist:  Garfield Cornea, MD   Chief Complaint  Patient presents with  . Diverticulitis  . Diarrhea    HPI:  Ann Fuller is a 68 y.o. female here for followup. She was last seen in May of 2013. She has a history of GERD, diverticulitis, IBS. She is due for surveillance colonoscopy in 11/2015 for tubular adenomas  Recently had a flare of her abdominal pain. Discussed with Dr. Willey Blade. Treated empirically for diverticulitis. She notes that she has a flare of her abdominal pain and diarrhea when she's upset or eats certain foods. Salads, nuts are the worse. Uses Bentyl as needed, if she uses daily she develops constipation. Currently she is back at baseline. No significant abdominal pain. Trying to eat better. Denies any melena rectal bleeding. Reflux well-controlled on Dexilant.   Current Outpatient Prescriptions  Medication Sig Dispense Refill  . albuterol (VENTOLIN HFA) 108 (90 BASE) MCG/ACT inhaler Inhale 2 puffs into the lungs every 6 (six) hours as needed. Shortness of Breath      . amLODipine (NORVASC) 5 MG tablet Take 5 mg by mouth daily.        Marland Kitchen aspirin 81 MG tablet Take 81 mg by mouth daily.      Marland Kitchen atorvastatin (LIPITOR) 40 MG tablet Take 40 mg by mouth daily.        . benzonatate (TESSALON) 200 MG capsule TAKE ONE CAPSULE EVERY FOUR HOURS AS NEEDED FOR COUGH  90 capsule  3  . budesonide-formoterol (SYMBICORT) 160-4.5 MCG/ACT inhaler Inhale 2 puffs into the lungs 2 (two) times daily.  1 Inhaler  5  . chlorthalidone (HYGROTON) 25 MG tablet Take 25 mg by mouth daily.        . cyclobenzaprine (FLEXERIL) 10 MG tablet Take 10 mg by mouth 2 (two) times daily as needed for muscle spasms.      Marland Kitchen dexlansoprazole (DEXILANT) 60 MG capsule Take 60 mg by mouth daily.       Marland Kitchen dicyclomine (BENTYL) 10 MG capsule TAKE ONE CAPSULE BY MOUTH PRIOR TO MEALSAS NEEDED (NO MORE THAN FOUR TIMES DAILY)  120 capsule  0  . exenatide (BYETTA 10  MCG PEN) 10 MCG/0.04ML SOLN Inject 10 mcg into the skin 2 (two) times daily with a meal.       . HYDROcodone-acetaminophen (NORCO/VICODIN) 5-325 MG per tablet Take 1 tablet by mouth every 4 (four) hours as needed for pain.      Marland Kitchen ibuprofen (ADVIL,MOTRIN) 400 MG tablet Take 1 tablet (400 mg total) by mouth every 6 (six) hours as needed.  30 tablet  0  . Insulin Aspart (NOVOLOG FLEXPEN [place]) 10 units twice daily before meals      . Insulin Glargine (LANTUS SOLOSTAR [place]) 55 units every morning before breakfast      . LORazepam (ATIVAN) 1 MG tablet Take 1 mg by mouth 2 (two) times daily as needed. Anxiety/Sleep      . losartan (COZAAR) 100 MG tablet Take 100 mg by mouth daily.        . temazepam (RESTORIL) 15 MG capsule Take 15 mg by mouth at bedtime as needed. Sleep      . mometasone-formoterol (DULERA) 200-5 MCG/ACT AERO Inhale 2 puffs into the lungs 2 (two) times daily.  1 Inhaler  11   No current facility-administered medications for this visit.   Facility-Administered Medications Ordered in Other Visits  Medication Dose Route Frequency Provider Last Rate Last Dose  .  lactated ringers infusion    Continuous PRN Renato Shin, CRNA        Allergies as of 08/25/2014 - Review Complete 08/25/2014  Allergen Reaction Noted  . Ceftin [cefuroxime axetil] Swelling 05/02/2012  . Codeine Swelling   . Metformin and related Diarrhea 11/26/2012  . Nexium [esomeprazole magnesium] Diarrhea 05/02/2012  . Omnicef [cefdinir] Diarrhea 05/02/2012  . Sulfa antibiotics Rash 05/02/2012    Past Medical History  Diagnosis Date  . Essential hypertension, benign   . Type 2 diabetes mellitus   . Adenomatous colon polyp   . Diverticulitis   . Internal hemorrhoid   . IBS (irritable bowel syndrome)   . GERD (gastroesophageal reflux disease)   . Dyspepsia   . Hiatal hernia   . Tubular adenoma 11/2010  . Bulging lumbar disc   . PONV (postoperative nausea and vomiting)   . Asthma   . Arthritis   . Breast  cancer 1990;1995  . Right shoulder pain     Past Surgical History  Procedure Laterality Date  . Cholecystectomy    . Tubal ligation    . Esophagogastroduodenoscopy  12/13/2010    Dr. Jennet Maduro hernia, fundal gland type polyps  . Colonoscopy  12/13/2010    Dr. Margarito Courser pancolonic diverticular.tubular adenoma  . Colonoscopy  07/13/2003  . Appendectomy    . Mastectomy  1995  . Back surgery  2007    Lumbar fusion  . Tonsillectomy    . Abdominal hysterectomy    . Cataract extraction w/phaco Left 08/27/2013    Procedure: CATARACT EXTRACTION PHACO AND INTRAOCULAR LENS PLACEMENT (IOC);  Surgeon: Tonny Branch, MD;  Location: [facility];  Service: Ophthalmology;  Laterality: Left;  CDE 4.25  . Eye surgery Left     Left KPE 08/27/13  . Cataract extraction w/phaco Right 09/24/2013    Procedure: CATARACT EXTRACTION PHACO AND INTRAOCULAR LENS PLACEMENT (IOC);  Surgeon: Tonny Branch, MD;  Location: [facility];  Service: Ophthalmology;  Laterality: Right;  CDE:  8.16    Family History  Problem Relation Age of Onset  . Colon cancer Mother   . Heart attack Father   . Heart attack Sister   . Stroke Sister   . Cancer - Lung Sister     History   Social History  . Marital Status: Married    Spouse Name: N/A    Number of Children: N/A  . Years of Education: N/A   Occupational History  . Not on file.   Social History Main Topics  . Smoking status: Never Smoker   . Smokeless tobacco: Never Used  . Alcohol Use: No  . Drug Use: No  . Sexual Activity: Not on file   Other Topics Concern  . Not on file   Social History Narrative  . No narrative on file      ROS:  General: Negative for anorexia, weight loss, fever, chills, fatigue, weakness. Eyes: Negative for vision changes.  ENT: Negative for hoarseness, difficulty swallowing , nasal congestion. CV: Negative for chest pain, angina, palpitations, dyspnea on exertion, peripheral edema.  Respiratory: Negative for dyspnea at rest, dyspnea  on exertion, cough, sputum, wheezing.  GI: See history of present illness. GU:  Negative for dysuria, hematuria, urinary incontinence, urinary frequency, nocturnal urination.  MS: Negative for joint pain, low back pain.  Derm: Negative for rash or itching.  Neuro: Negative for weakness, abnormal sensation, seizure, frequent headaches, memory loss, confusion.  Psych: Negative for anxiety, depression, suicidal ideation, hallucinations.  Endo: Negative for unusual  weight change.  Heme: Negative for bruising or bleeding. Allergy: Negative for rash or hives.    Physical Examination:  BP 147/75  Pulse 102  Temp(Src) 98.7 F (37.1 C) (Oral)  Ht 4\' 11"  (1.499 m)  Wt 231 lb 9.6 oz (105.053 kg)  BMI 46.75 kg/m2   General: Well-nourished, well-developed in no acute distress.  Head: Normocephalic, atraumatic.   Eyes: Conjunctiva pink, no icterus. Mouth: Oropharyngeal mucosa moist and pink , no lesions erythema or exudate. Neck: Supple without thyromegaly, masses, or lymphadenopathy.  Lungs: Clear to auscultation bilaterally.  Heart: Regular rate and rhythm, no murmurs rubs or gallops.  Abdomen: Bowel sounds are normal, nontender, nondistended, no hepatosplenomegaly or masses, no abdominal bruits or    hernia , no rebound or guarding.   Rectal: not peformed Extremities: No lower extremity edema. No clubbing or deformities.  Neuro: Alert and oriented x 4 , grossly normal neurologically.  Skin: Warm and dry, no rash or jaundice.   Psych: Alert and cooperative, normal mood and affect.

## 2014-08-25 NOTE — Assessment & Plan Note (Signed)
Doing well on PPI therapy. Discussed antireflux measures.

## 2014-08-25 NOTE — Patient Instructions (Signed)
1. Please start probiotic, Restora 1 daily for 3 weeks. After that you may take probiotics as needed, generally take 3-4 weeks at a time. [facility] colon health is over-the-counter and cost effective. Alternatively you can eat one yogurt daily for the same affect. 2. You will be due for a colonoscopy 11/2015.

## 2014-08-25 NOTE — Progress Notes (Signed)
Cc to pcp °

## 2014-09-16 ENCOUNTER — Other Ambulatory Visit: Payer: Self-pay

## 2014-09-16 DIAGNOSIS — Z9012 Acquired absence of left breast and nipple: Secondary | ICD-10-CM

## 2014-09-16 DIAGNOSIS — Z1231 Encounter for screening mammogram for malignant neoplasm of breast: Secondary | ICD-10-CM

## 2014-10-18 ENCOUNTER — Emergency Department (HOSPITAL_COMMUNITY)
Admission: EM | Admit: 2014-10-18 | Discharge: 2014-10-18 | Disposition: A | Discharge status: Home / Self Care | Payer: BC Managed Care – PPO | Attending: Emergency Medicine | Admitting: Emergency Medicine

## 2014-10-18 ENCOUNTER — Encounter (HOSPITAL_COMMUNITY): Payer: Self-pay | Admitting: Emergency Medicine

## 2014-10-18 DIAGNOSIS — Z7951 Long term (current) use of inhaled steroids: Secondary | ICD-10-CM | POA: Insufficient documentation

## 2014-10-18 DIAGNOSIS — Z792 Long term (current) use of antibiotics: Secondary | ICD-10-CM | POA: Insufficient documentation

## 2014-10-18 DIAGNOSIS — Z86010 Personal history of colonic polyps: Secondary | ICD-10-CM | POA: Insufficient documentation

## 2014-10-18 DIAGNOSIS — R040 Epistaxis: Secondary | ICD-10-CM | POA: Insufficient documentation

## 2014-10-18 DIAGNOSIS — Z79899 Other long term (current) drug therapy: Secondary | ICD-10-CM | POA: Insufficient documentation

## 2014-10-18 DIAGNOSIS — K589 Irritable bowel syndrome without diarrhea: Secondary | ICD-10-CM | POA: Insufficient documentation

## 2014-10-18 DIAGNOSIS — M1990 Unspecified osteoarthritis, unspecified site: Secondary | ICD-10-CM | POA: Diagnosis not present

## 2014-10-18 DIAGNOSIS — R531 Weakness: Secondary | ICD-10-CM | POA: Insufficient documentation

## 2014-10-18 DIAGNOSIS — E119 Type 2 diabetes mellitus without complications: Secondary | ICD-10-CM | POA: Diagnosis not present

## 2014-10-18 DIAGNOSIS — I10 Essential (primary) hypertension: Secondary | ICD-10-CM | POA: Diagnosis not present

## 2014-10-18 DIAGNOSIS — J45909 Unspecified asthma, uncomplicated: Secondary | ICD-10-CM | POA: Diagnosis not present

## 2014-10-18 DIAGNOSIS — Z853 Personal history of malignant neoplasm of breast: Secondary | ICD-10-CM | POA: Insufficient documentation

## 2014-10-18 DIAGNOSIS — K219 Gastro-esophageal reflux disease without esophagitis: Secondary | ICD-10-CM | POA: Insufficient documentation

## 2014-10-18 DIAGNOSIS — Z794 Long term (current) use of insulin: Secondary | ICD-10-CM | POA: Insufficient documentation

## 2014-10-18 DIAGNOSIS — Z7982 Long term (current) use of aspirin: Secondary | ICD-10-CM | POA: Insufficient documentation

## 2014-10-18 MED ORDER — OXYMETAZOLINE HCL 0.05 % NA SOLN
1.0000 | Freq: Once | NASAL | Status: AC
  Administered 2014-10-18: 1 via NASAL
  Filled 2014-10-18: qty 15

## 2014-10-18 MED ORDER — LIDOCAINE-EPINEPHRINE (PF) 2 %-1:200000 IJ SOLN
INTRAMUSCULAR | Status: AC
  Administered 2014-10-18: 06:00:00
  Filled 2014-10-18: qty 20

## 2014-10-18 MED ORDER — SILVER NITRATE-POT NITRATE 75-25 % EX MISC
CUTANEOUS | Status: AC
  Filled 2014-10-18: qty 1

## 2014-10-18 NOTE — ED Notes (Signed)
Discharge instructions given, pt demonstrated teach back and verbal understanding. No concerns voiced.  

## 2014-10-18 NOTE — ED Notes (Signed)
Woke around 4am with nose bleed, lasted around 15 or 20 minutes. Now feels like there is a clot in left nostral. Had nose bleed last week.

## 2014-10-18 NOTE — ED Notes (Signed)
Patient has lymph nodes in left arm. Extremity bracelet placed on patient.

## 2014-10-18 NOTE — ED Notes (Signed)
Afrin Nasal spray given per EDP order. No bleeding at this time. Pt states she feels like it has "stopped for now".

## 2014-10-18 NOTE — ED Notes (Signed)
No bleeding on recheck. Patient comfortable at this time.

## 2014-10-18 NOTE — ED Provider Notes (Signed)
CSN: [id number]     Arrival date & time 10/18/14  6295 History   First MD Initiated Contact with Patient 10/18/14 0540     Chief Complaint  Patient presents with  . Epistaxis     Patient is a 68 y.o. female presenting with nosebleeds. The history is provided by the patient and a significant other.  Epistaxis Location:  L nare Severity:  Moderate Duration:  20 minutes Timing:  Constant Progression:  Resolved Chronicity:  New Context: aspirin use and hypertension   Context: not anticoagulants   Relieved by:  Applying pressure Worsened by:  Nothing tried Associated symptoms: blood in oropharynx   Associated symptoms: no dizziness, no fever and no headaches    Pt denies facial trauma She reports this occurred last week but resolved spontaneously and did not seek treatment  Past Medical History  Diagnosis Date  . Essential hypertension, benign   . Type 2 diabetes mellitus   . Adenomatous colon polyp   . Diverticulitis   . Internal hemorrhoid   . IBS (irritable bowel syndrome)   . GERD (gastroesophageal reflux disease)   . Dyspepsia   . Hiatal hernia   . Tubular adenoma 11/2010  . Bulging lumbar disc   . PONV (postoperative nausea and vomiting)   . Asthma   . Arthritis   . Breast cancer 1990;1995  . Right shoulder pain    Past Surgical History  Procedure Laterality Date  . Cholecystectomy    . Tubal ligation    . Esophagogastroduodenoscopy  12/13/2010    Dr. Jennet Maduro hernia, fundal gland type polyps  . Colonoscopy  12/13/2010    Dr. Margarito Courser pancolonic diverticular.tubular adenoma  . Colonoscopy  07/13/2003  . Appendectomy    . Mastectomy  1995  . Back surgery  2007    Lumbar fusion  . Tonsillectomy    . Abdominal hysterectomy    . Cataract extraction w/phaco Left 08/27/2013    Procedure: CATARACT EXTRACTION PHACO AND INTRAOCULAR LENS PLACEMENT (IOC);  Surgeon: Tonny Branch, MD;  Location: [facility];  Service: Ophthalmology;  Laterality: Left;  CDE 4.25  .  Eye surgery Left     Left KPE 08/27/13  . Cataract extraction w/phaco Right 09/24/2013    Procedure: CATARACT EXTRACTION PHACO AND INTRAOCULAR LENS PLACEMENT (IOC);  Surgeon: Tonny Branch, MD;  Location: [facility];  Service: Ophthalmology;  Laterality: Right;  CDE:  8.16   Family History  Problem Relation Age of Onset  . Colon cancer Mother   . Heart attack Father   . Heart attack Sister   . Stroke Sister   . Cancer - Lung Sister    History  Substance Use Topics  . Smoking status: Never Smoker   . Smokeless tobacco: Never Used  . Alcohol Use: No   OB History   Grav Para Term Preterm Abortions TAB SAB Ect Mult Living                 Review of Systems  Constitutional: Negative for fever.  HENT: Positive for nosebleeds.   Neurological: Positive for weakness. Negative for dizziness and headaches.      Allergies  Ceftin; Codeine; Metformin and related; Nexium; Omnicef; and Sulfa antibiotics  Home Medications   Prior to Admission medications   Medication Sig Start Date End Date Taking? Authorizing Provider  amLODipine (NORVASC) 5 MG tablet Take 5 mg by mouth daily.     Yes Historical Provider, MD  amoxicillin (AMOXIL) 500 MG capsule Take 500 mg by mouth  4 (four) times daily.   Yes Historical Provider, MD  atorvastatin (LIPITOR) 40 MG tablet Take 40 mg by mouth daily.     Yes Historical Provider, MD  chlorthalidone (HYGROTON) 25 MG tablet Take 25 mg by mouth daily.     Yes Historical Provider, MD  dexlansoprazole (DEXILANT) 60 MG capsule Take 60 mg by mouth daily.    Yes Historical Provider, MD  dicyclomine (BENTYL) 10 MG capsule TAKE ONE CAPSULE BY MOUTH PRIOR TO MEALSAS NEEDED (NO MORE THAN FOUR TIMES DAILY)   Yes Mahala Menghini, PA-C  exenatide (BYETTA 10 MCG PEN) 10 MCG/0.04ML SOLN Inject 10 mcg into the skin 2 (two) times daily with a meal.    Yes Historical Provider, MD  HYDROcodone-acetaminophen (NORCO/VICODIN) 5-325 MG per tablet Take 1 tablet by mouth every 4 (four) hours as  needed for pain.   Yes Historical Provider, MD  Insulin Aspart (NOVOLOG FLEXPEN [place]) 10 units twice daily before meals   Yes Historical Provider, MD  Insulin Glargine (LANTUS SOLOSTAR [place]) 55 units every morning before breakfast   Yes Historical Provider, MD  LORazepam (ATIVAN) 1 MG tablet Take 1 mg by mouth 2 (two) times daily as needed. Anxiety/Sleep   Yes Historical Provider, MD  losartan (COZAAR) 100 MG tablet Take 100 mg by mouth daily.     Yes Historical Provider, MD  mometasone-formoterol (DULERA) 200-5 MCG/ACT AERO Inhale 2 puffs into the lungs 2 (two) times daily. 01/11/14  Yes Elsie Stain, MD  temazepam (RESTORIL) 15 MG capsule Take 15 mg by mouth at bedtime as needed. Sleep   Yes Historical Provider, MD  albuterol (VENTOLIN HFA) 108 (90 BASE) MCG/ACT inhaler Inhale 2 puffs into the lungs every 6 (six) hours as needed. Shortness of Breath    Historical Provider, MD  aspirin 81 MG tablet Take 81 mg by mouth daily.    Historical Provider, MD  budesonide-formoterol (SYMBICORT) 160-4.5 MCG/ACT inhaler Inhale 2 puffs into the lungs 2 (two) times daily. 01/11/14   Elsie Stain, MD  cyclobenzaprine (FLEXERIL) 10 MG tablet Take 10 mg by mouth 2 (two) times daily as needed for muscle spasms.    Historical Provider, MD   BP 150/62  Pulse 90  Temp(Src) 97.8 F (36.6 C) (Oral)  Resp 18  Ht 4\' 11"  (1.499 m)  Wt 233 lb (105.688 kg)  BMI 47.04 kg/m2  SpO2 97% Physical Exam CONSTITUTIONAL: Well developed/well nourished HEAD: Normocephalic/atraumatic EYES: EOMI ENMT: Mucous membranes moist, no blood noted in oropharynx.  Blood clot noted in left nare.  No active bleeding NECK: supple no meningeal signs CV: S1/S2 noted, no murmurs/rubs/gallops noted LUNGS: Lungs are clear to auscultation bilaterally, no apparent distress ABDOMEN: soft, nontender, no rebound or guarding NEURO: Pt is awake/alert, moves all extremitiesx4 EXTREMITIES: pulses normal, full ROM SKIN: warm, color normal PSYCH:  no abnormalities of mood noted  ED Course  EPISTAXIS MANAGEMENT Date/Time: 10/18/2014 6:28 AM Performed by: Sharyon Cable Authorized by: Sharyon Cable Consent: Verbal consent obtained. Patient identity confirmed: verbally with patient Time out: Immediately prior to procedure a "time out" was called to verify the correct patient, procedure, equipment, support staff and site/side marked as required. Comments: Afrin spray to left nare Lidocaine with epinephrine placed on nasal septum   after placement of afrin, there was a small area in left anterior nasal septum that was likely source of bleeding Gauze soaked in lido with epinephrine was placed inside nose.  On recheck, no areas of bleeding noted.  Silver nitrate was not utilized  We discussed strict return precautions.  We discussed appropriate use of pressure if further episodes of bleeding occur. MDM   Final diagnoses:  Acute anterior epistaxis    Nursing notes including past medical history and social history reviewed and considered in documentation     Sharyon Cable, MD 10/18/14 [phone]

## 2014-10-18 NOTE — Discharge Instructions (Signed)
Nosebleed °A nosebleed can be caused by many things, including: °· Getting hit hard in the nose. °· Infections. °· Dry nose. °· Colds. °· Medicines. °Your doctor may do lab testing if you get nosebleeds a lot and the cause is not known. °HOME CARE  °· If your nose was packed with material, keep it there until your doctor takes it out. Put the pack back in your nose if the pack falls out. °· Do not blow your nose for 12 hours after the nosebleed. °· Sit up and bend forward if your nose starts bleeding again. Pinch the front half of your nose nonstop for 20 minutes. °· Put petroleum jelly inside your nose every morning if you have a dry nose. °· Use a humidifier to make the air less dry. °· Do not take aspirin. °· Try not to strain, lift, or bend at the waist for many days after the nosebleed. °GET HELP RIGHT AWAY IF:  °· Nosebleeds keep happening and are hard to stop or control. °· You have bleeding or bruises that are not normal on other parts of the body. °· You have a fever. °· The nosebleeds get worse. °· You get lightheaded, feel faint, sweaty, or throw up (vomit) blood. °MAKE SURE YOU:  °· Understand these instructions. °· Will watch your condition. °· Will get help right away if you are not doing well or get worse. °Document Released: 09/18/2008 Document Revised: 03/03/2012 Document Reviewed: 09/18/2008 °ExitCare® Patient Information ©2015 [employer]. This information is not intended to replace advice given to you by your health care provider. Make sure you discuss any questions you have with your health care provider. ° °

## 2014-10-21 ENCOUNTER — Ambulatory Visit (INDEPENDENT_AMBULATORY_CARE_PROVIDER_SITE_OTHER): Payer: BC Managed Care – PPO | Admitting: Otolaryngology

## 2014-10-21 DIAGNOSIS — R040 Epistaxis: Secondary | ICD-10-CM

## 2014-10-25 ENCOUNTER — Ambulatory Visit: Payer: BC Managed Care – PPO

## 2014-10-26 ENCOUNTER — Ambulatory Visit
Admission: RE | Admit: 2014-10-26 | Discharge: 2014-10-26 | Disposition: A | Discharge status: Home / Self Care | Payer: BC Managed Care – PPO | Source: Ambulatory Visit

## 2014-10-26 DIAGNOSIS — Z9012 Acquired absence of left breast and nipple: Secondary | ICD-10-CM

## 2014-10-26 DIAGNOSIS — Z1231 Encounter for screening mammogram for malignant neoplasm of breast: Secondary | ICD-10-CM

## 2014-11-08 ENCOUNTER — Other Ambulatory Visit: Payer: Self-pay | Admitting: Gastroenterology

## 2014-11-25 ENCOUNTER — Ambulatory Visit (INDEPENDENT_AMBULATORY_CARE_PROVIDER_SITE_OTHER): Payer: BC Managed Care – PPO | Admitting: Otolaryngology

## 2015-01-06 ENCOUNTER — Encounter (HOSPITAL_COMMUNITY): Payer: Self-pay | Admitting: Neurosurgery

## 2015-01-20 ENCOUNTER — Other Ambulatory Visit: Payer: Self-pay | Admitting: Critical Care Medicine

## 2015-01-20 NOTE — Telephone Encounter (Signed)
Received refill request for Symbicort. Pt last seen by PW 01/11/14. No pending appt. Spoke with pt who reports she's had several asthma attacks recently (increased SOB and cough) - requesting OV this wk.  Pt scheduled to see MW tomorrow, Jan 29 at 10:30 am at [facility].  Pt confirmed appt, is aware rx sent to pharm, and is to seek emergency care if needed.

## 2015-01-21 ENCOUNTER — Ambulatory Visit (INDEPENDENT_AMBULATORY_CARE_PROVIDER_SITE_OTHER): Payer: BLUE CROSS/BLUE SHIELD | Admitting: Internal Medicine

## 2015-01-21 ENCOUNTER — Encounter: Payer: Self-pay | Admitting: Internal Medicine

## 2015-01-21 VITALS — BP 124/76 | HR 65 | Temp 98.5°F | Ht 59.0 in | Wt 232.0 lb

## 2015-01-21 DIAGNOSIS — J4531 Mild persistent asthma with (acute) exacerbation: Secondary | ICD-10-CM

## 2015-01-21 DIAGNOSIS — J4520 Mild intermittent asthma, uncomplicated: Secondary | ICD-10-CM

## 2015-01-21 MED ORDER — METHYLPREDNISOLONE ACETATE 80 MG/ML IJ SUSP
120.0000 mg | Freq: Once | INTRAMUSCULAR | Status: AC
  Administered 2015-01-21: 120 mg via INTRAMUSCULAR

## 2015-01-21 NOTE — Progress Notes (Signed)
Subjective:    Patient ID: Ann Fuller, female    DOB: Nov 10, 1946, 69 y.o.   MRN: [id number]    Brief patient profile:  69 yo female never smoker with asthma and tendency to cyclical cough     PUL ASTHMA HISTORY 01/11/2014 07/14/2013 05/25/2013 11/12/2012  Symptoms Daily 0-2 days/week Daily 0-2 days/week  Nighttime awakenings 0-2/month 0-2/month Often--7/wk 0-2/month  Interference with activity Some limitations No limitations Minor limitations No limitations  SABA use 0-2 days/wk 0-2 days/wk Several times/day 0-2 days/wk  Exacerbations requiring oral steroids 0-1 / year 0-1 / year 0-1 / year 0-1 / year   01/11/2014 Chief Complaint  Patient presents with  . Follow-up    c/o wheezing and prod cough with yellow mucus x 2 wks.  No increased SOB, chest tightness/pain, or f/c/s.  Notes wheezing and cough for two weeks.  Zpak rx before holidays, then cough came back.  Now is yellow mucus.  Notes more wheezing , notes more dyspnea with exertion. No sinus issues rec Stay on dulera two puff twice daily A azithromycin RX sent to pharmacy    01/21/2015 acute  ov/Samer Dutton re: asthma flare Chief Complaint  Patient presents with  . Acute Visit    Pt c/o increased SOB and cough for the past 2 wks. She started having soreness in her chest last night. Her cough is prod with minimal yellow sputum.  She is using rescue inhaler 2 x per day on average.   flared about the time she ran out of her dulera/symbicort (uses interchangeably) > sob x across the room when flares, some better with saba but hfa very poor. Coughs so hard hurts in ant chest diffusely during coughing fits coiugh Better with tessalon but not completely reieved   No obvious other patterns in day to day or daytime variabilty or assoc chest tightness, subjective wheeze overt sinus or hb symptoms. No unusual exp hx or h/o childhood pna/ asthma or knowledge of premature birth.  Sleeping ok without nocturnal  or early am exacerbation  of  respiratory  c/o's or need for noct saba. Also denies any obvious fluctuation of symptoms with weather or environmental changes or other aggravating or alleviating factors except as outlined above   Current Medications, Allergies, Complete Past Medical History, Past Surgical History, Family History, and Social History were reviewed in [employer] record.  ROS  The following are not active complaints unless bolded sore throat, dysphagia, dental problems, itching, sneezing,  nasal congestion or excess/ purulent secretions, ear ache,   fever, chills, sweats, unintended wt loss, pleuritic or exertional cp, hemoptysis,  orthopnea pnd or leg swelling, presyncope, palpitations, heartburn, abdominal pain, anorexia, nausea, vomiting, diarrhea  or change in bowel or urinary habits, change in stools or urine, dysuria,hematuria,  rash, arthralgias, visual complaints, headache, numbness weakness or ataxia or problems with walking or coordination,  change in mood/affect or memory.                      Objective:   Physical Exam   Wt Readings from Last 3 Encounters:  01/21/15 232 lb (105.235 kg)  10/18/14 233 lb (105.688 kg)  08/25/14 231 lb 9.6 oz (105.053 kg)    Vital signs reviewed      GEN: A/Ox3; pleasant , NAD, well nourished   HEENT:  [place]/AT,  EACs-clear, TMs-wnl, NOSE-clear drainage , THROAT-clear, no lesions, no postnasal drip or exudate noted.   NECK:  Supple w/ fair ROM; no JVD;  normal carotid impulses w/o bruits; no thyromegaly or nodules palpated; no lymphadenopathy.  RESP  No increased wob,  Mid exp wheeze bilaterally with assoc cough  CARD:  RRR, no m/r/g  , no peripheral edema, pulses intact, no cyanosis or clubbing.  GI:   Soft & nt; nml bowel sounds; no organomegaly or masses detected.  Musco: Warm bil, no deformities or joint swelling noted.   Neuro: alert, no focal deficits noted.    Skin: Warm, no lesions or rashes        Assessment &  Plan:

## 2015-01-21 NOTE — Patient Instructions (Addendum)
Depomedrol 120 mg today  Symbicort 160 Take 2 puffs first thing in am and then another 2 puffs about 12 hours later.   Only use your albuterol (ventolin)  as a rescue medication to be used if you can't catch your breath by resting or doing a relaxed purse lip breathing pattern.  - The less you use it, the better it will work when you need it. - Ok to use up to 2 puffs  every 4 hours if you must but call for immediate appointment if use goes up over your usual need - Don't leave home without it !!  (think of it like the spare tire for your car)   When you return please bring your spacer with you so we can verify it's working properly.

## 2015-01-22 ENCOUNTER — Encounter: Payer: Self-pay | Admitting: Internal Medicine

## 2015-01-22 DIAGNOSIS — J45991 Cough variant asthma: Secondary | ICD-10-CM | POA: Insufficient documentation

## 2015-01-22 NOTE — Assessment & Plan Note (Addendum)
Acute flare mod severity  DDX of  difficult airways management all start with A and  include Adherence, Ace Inhibitors, Acid Reflux, Active Sinus Disease, Alpha 1 Antitripsin deficiency, Anxiety masquerading as Airways dz,  ABPA,  allergy(esp in young), Aspiration (esp in elderly), Adverse effects of DPI,  Active smokers, plus two Bs  = Bronchiectasis and Beta blocker use..and one C= CHF  Adherence is always the initial "prime suspect" and is a multilayered concern that requires a "trust but verify" approach in every patient - starting with knowing how to use medications, especially inhalers, correctly, keeping up with refills and understanding the fundamental difference between maintenance and prns vs those medications only taken for a very short course and then stopped and not refilled.  - out of maint x rx x 2 weeks, cautioned that this really shouldn't be happening  - The proper method of use, as well as anticipated side effects, of a metered-dose inhaler are discussed and demonstrated to the patient. Improved effectiveness after extensive coaching during this visit to a level of approximately  25% - says uses spacer at home but even then doubt she does it correctly and advised to bring it with her to office next ov to check technique noting she's poor dpi candidate due to cough  ? Allergy > doubt/ depomedrol 120 should cover  ? Acid (or non-acid) GERD > always difficult to exclude as up to 75% of pts in some series report no assoc GI/ Heartburn symptoms> rec cont max (24h)  acid suppression and diet restrictions/ reviewed and instructions given in writing.   See instructions for specific recommendations which were reviewed directly with the patient who was given a copy with highlighter outlining the key components.

## 2015-03-23 ENCOUNTER — Ambulatory Visit: Payer: Medicare Other | Admitting: Critical Care Medicine

## 2015-04-06 ENCOUNTER — Emergency Department (HOSPITAL_COMMUNITY): Payer: BLUE CROSS/BLUE SHIELD

## 2015-04-06 ENCOUNTER — Encounter (HOSPITAL_COMMUNITY): Payer: Self-pay | Admitting: *Deleted

## 2015-04-06 ENCOUNTER — Inpatient Hospital Stay (HOSPITAL_COMMUNITY)
Admission: EM | Admit: 2015-04-06 | Discharge: 2015-04-10 | DRG: 871 | Disposition: A | Discharge status: Home / Self Care | Payer: BLUE CROSS/BLUE SHIELD | Attending: Internal Medicine | Admitting: Internal Medicine

## 2015-04-06 DIAGNOSIS — Z7982 Long term (current) use of aspirin: Secondary | ICD-10-CM | POA: Diagnosis not present

## 2015-04-06 DIAGNOSIS — J189 Pneumonia, unspecified organism: Secondary | ICD-10-CM | POA: Diagnosis present

## 2015-04-06 DIAGNOSIS — F419 Anxiety disorder, unspecified: Secondary | ICD-10-CM | POA: Diagnosis present

## 2015-04-06 DIAGNOSIS — E119 Type 2 diabetes mellitus without complications: Secondary | ICD-10-CM | POA: Diagnosis present

## 2015-04-06 DIAGNOSIS — Z800 Family history of malignant neoplasm of digestive organs: Secondary | ICD-10-CM

## 2015-04-06 DIAGNOSIS — J45909 Unspecified asthma, uncomplicated: Secondary | ICD-10-CM | POA: Diagnosis present

## 2015-04-06 DIAGNOSIS — Z853 Personal history of malignant neoplasm of breast: Secondary | ICD-10-CM | POA: Diagnosis not present

## 2015-04-06 DIAGNOSIS — I10 Essential (primary) hypertension: Secondary | ICD-10-CM | POA: Diagnosis present

## 2015-04-06 DIAGNOSIS — D899 Disorder involving the immune mechanism, unspecified: Secondary | ICD-10-CM | POA: Diagnosis present

## 2015-04-06 DIAGNOSIS — Z823 Family history of stroke: Secondary | ICD-10-CM

## 2015-04-06 DIAGNOSIS — G4700 Insomnia, unspecified: Secondary | ICD-10-CM | POA: Diagnosis present

## 2015-04-06 DIAGNOSIS — M1990 Unspecified osteoarthritis, unspecified site: Secondary | ICD-10-CM | POA: Diagnosis present

## 2015-04-06 DIAGNOSIS — Z8249 Family history of ischemic heart disease and other diseases of the circulatory system: Secondary | ICD-10-CM | POA: Diagnosis not present

## 2015-04-06 DIAGNOSIS — K589 Irritable bowel syndrome without diarrhea: Secondary | ICD-10-CM | POA: Diagnosis present

## 2015-04-06 DIAGNOSIS — K219 Gastro-esophageal reflux disease without esophagitis: Secondary | ICD-10-CM | POA: Diagnosis present

## 2015-04-06 DIAGNOSIS — E785 Hyperlipidemia, unspecified: Secondary | ICD-10-CM | POA: Diagnosis present

## 2015-04-06 DIAGNOSIS — Z794 Long term (current) use of insulin: Secondary | ICD-10-CM | POA: Diagnosis not present

## 2015-04-06 DIAGNOSIS — A419 Sepsis, unspecified organism: Principal | ICD-10-CM | POA: Diagnosis present

## 2015-04-06 DIAGNOSIS — R509 Fever, unspecified: Secondary | ICD-10-CM | POA: Diagnosis not present

## 2015-04-06 DIAGNOSIS — Z7951 Long term (current) use of inhaled steroids: Secondary | ICD-10-CM

## 2015-04-06 DIAGNOSIS — E871 Hypo-osmolality and hyponatremia: Secondary | ICD-10-CM | POA: Diagnosis present

## 2015-04-06 DIAGNOSIS — Z9010 Acquired absence of unspecified breast and nipple: Secondary | ICD-10-CM | POA: Diagnosis present

## 2015-04-06 LAB — URINALYSIS, ROUTINE W REFLEX MICROSCOPIC
Bilirubin Urine: NEGATIVE
Glucose, UA: NEGATIVE mg/dL
Ketones, ur: NEGATIVE mg/dL
LEUKOCYTES UA: NEGATIVE
NITRITE: NEGATIVE
PROTEIN: NEGATIVE mg/dL
SPECIFIC GRAVITY, URINE: 1.015 (ref 1.005–1.030)
UROBILINOGEN UA: 0.2 mg/dL (ref 0.0–1.0)
pH: 5.5 (ref 5.0–8.0)

## 2015-04-06 LAB — CBC WITH DIFFERENTIAL/PLATELET
Basophils Absolute: 0 10*3/uL (ref 0.0–0.1)
Basophils Relative: 0 % (ref 0–1)
EOS PCT: 1 % (ref 0–5)
Eosinophils Absolute: 0.1 10*3/uL (ref 0.0–0.7)
HEMATOCRIT: 33 % — AB (ref 36.0–46.0)
Hemoglobin: 11 g/dL — ABNORMAL LOW (ref 12.0–15.0)
Lymphocytes Relative: 4 % — ABNORMAL LOW (ref 12–46)
Lymphs Abs: 0.4 10*3/uL — ABNORMAL LOW (ref 0.7–4.0)
MCH: 30 pg (ref 26.0–34.0)
MCHC: 33.3 g/dL (ref 30.0–36.0)
MCV: 89.9 fL (ref 78.0–100.0)
Monocytes Absolute: 1.2 10*3/uL — ABNORMAL HIGH (ref 0.1–1.0)
Monocytes Relative: 13 % — ABNORMAL HIGH (ref 3–12)
NEUTROS ABS: 7.5 10*3/uL (ref 1.7–7.7)
Neutrophils Relative %: 82 % — ABNORMAL HIGH (ref 43–77)
Platelets: 210 10*3/uL (ref 150–400)
RBC: 3.67 MIL/uL — AB (ref 3.87–5.11)
RDW: 14.6 % (ref 11.5–15.5)
WBC: 9.2 10*3/uL (ref 4.0–10.5)

## 2015-04-06 LAB — URINE MICROSCOPIC-ADD ON

## 2015-04-06 LAB — CBG MONITORING, ED: Glucose-Capillary: 119 mg/dL — ABNORMAL HIGH (ref 70–99)

## 2015-04-06 LAB — COMPREHENSIVE METABOLIC PANEL
ALT: 22 U/L (ref 0–35)
ANION GAP: 11 (ref 5–15)
AST: 30 U/L (ref 0–37)
Albumin: 3.8 g/dL (ref 3.5–5.2)
Alkaline Phosphatase: 63 U/L (ref 39–117)
BUN: 10 mg/dL (ref 6–23)
CALCIUM: 8.8 mg/dL (ref 8.4–10.5)
CO2: 27 mmol/L (ref 19–32)
Chloride: 95 mmol/L — ABNORMAL LOW (ref 96–112)
Creatinine, Ser: 0.67 mg/dL (ref 0.50–1.10)
GFR calc non Af Amer: 90 mL/min — ABNORMAL LOW (ref >90)
Glucose, Bld: 112 mg/dL — ABNORMAL HIGH (ref 70–99)
Potassium: 3.5 mmol/L (ref 3.5–5.1)
Sodium: 133 mmol/L — ABNORMAL LOW (ref 135–145)
TOTAL PROTEIN: 7.5 g/dL (ref 6.0–8.3)
Total Bilirubin: 0.5 mg/dL (ref 0.3–1.2)

## 2015-04-06 LAB — BLOOD GAS, ARTERIAL
Acid-Base Excess: 2.1 mmol/L — ABNORMAL HIGH (ref 0.0–2.0)
Bicarbonate: 26.1 mEq/L — ABNORMAL HIGH (ref 20.0–24.0)
Drawn by: 21310
O2 SAT: 92.5 %
PATIENT TEMPERATURE: 37
TCO2: 22.9 mmol/L (ref 0–100)
pCO2 arterial: 40.5 mmHg (ref 35.0–45.0)
pH, Arterial: 7.425 (ref 7.350–7.450)
pO2, Arterial: 66.3 mmHg — ABNORMAL LOW (ref 80.0–100.0)

## 2015-04-06 LAB — PROCALCITONIN: Procalcitonin: <0.1 ng/mL

## 2015-04-06 LAB — I-STAT CG4 LACTIC ACID, ED: LACTIC ACID, VENOUS: 1.66 mmol/L (ref 0.5–2.0)

## 2015-04-06 MED ORDER — CHLORTHALIDONE 25 MG PO TABS
25.0000 mg | ORAL_TABLET | Freq: Every day | ORAL | Status: DC
  Administered 2015-04-07 – 2015-04-10 (×4): 25 mg via ORAL
  Filled 2015-04-06 (×7): qty 1

## 2015-04-06 MED ORDER — AMLODIPINE BESYLATE 5 MG PO TABS
5.0000 mg | ORAL_TABLET | Freq: Every day | ORAL | Status: DC
  Administered 2015-04-07 – 2015-04-09 (×3): 5 mg via ORAL
  Filled 2015-04-06 (×3): qty 1

## 2015-04-06 MED ORDER — SODIUM CHLORIDE 0.9 % IV BOLUS (SEPSIS): 500.0000 mL | INTRAVENOUS | Status: AC

## 2015-04-06 MED ORDER — PANTOPRAZOLE SODIUM 40 MG PO TBEC
40.0000 mg | DELAYED_RELEASE_TABLET | Freq: Every day | ORAL | Status: DC
  Administered 2015-04-07 – 2015-04-10 (×5): 40 mg via ORAL
  Filled 2015-04-06 (×5): qty 1

## 2015-04-06 MED ORDER — ONDANSETRON HCL 4 MG/2ML IJ SOLN: 4.0000 mg | Freq: Four times a day (QID) | INTRAMUSCULAR | Status: DC | PRN

## 2015-04-06 MED ORDER — ACETAMINOPHEN 325 MG PO TABS
650.0000 mg | ORAL_TABLET | Freq: Four times a day (QID) | ORAL | Status: DC | PRN
  Administered 2015-04-07 – 2015-04-10 (×6): 650 mg via ORAL
  Filled 2015-04-06 (×6): qty 2

## 2015-04-06 MED ORDER — INSULIN ASPART 100 UNIT/ML ~~LOC~~ SOLN
0.0000 [IU] | Freq: Three times a day (TID) | SUBCUTANEOUS | Status: DC
  Administered 2015-04-07 – 2015-04-08 (×2): 3 [IU] via SUBCUTANEOUS
  Administered 2015-04-08: 5 [IU] via SUBCUTANEOUS
  Administered 2015-04-09 (×3): 3 [IU] via SUBCUTANEOUS
  Administered 2015-04-10: 5 [IU] via SUBCUTANEOUS
  Administered 2015-04-10: 8 [IU] via SUBCUTANEOUS

## 2015-04-06 MED ORDER — BENZONATATE 100 MG PO CAPS: 200.0000 mg | ORAL_CAPSULE | ORAL | Status: DC | PRN

## 2015-04-06 MED ORDER — SODIUM CHLORIDE 0.9 % IV SOLN
INTRAVENOUS | Status: DC
  Administered 2015-04-06 – 2015-04-07 (×2): via INTRAVENOUS

## 2015-04-06 MED ORDER — ACETAMINOPHEN 650 MG RE SUPP: 650.0000 mg | Freq: Four times a day (QID) | RECTAL | Status: DC | PRN

## 2015-04-06 MED ORDER — DOXAZOSIN MESYLATE 2 MG PO TABS
2.0000 mg | ORAL_TABLET | Freq: Every day | ORAL | Status: DC
Start: 2015-04-07 — End: 2015-04-10
  Administered 2015-04-07 – 2015-04-10 (×4): 2 mg via ORAL
  Filled 2015-04-06 (×6): qty 1

## 2015-04-06 MED ORDER — ASPIRIN EC 81 MG PO TBEC
81.0000 mg | DELAYED_RELEASE_TABLET | Freq: Every day | ORAL | Status: DC
  Administered 2015-04-07 – 2015-04-09 (×4): 81 mg via ORAL
  Filled 2015-04-06 (×4): qty 1

## 2015-04-06 MED ORDER — VANCOMYCIN HCL IN DEXTROSE 1-5 GM/200ML-% IV SOLN: 1000.0000 mg | Freq: Once | INTRAVENOUS | Status: AC

## 2015-04-06 MED ORDER — SODIUM CHLORIDE 0.9 % IV SOLN
Freq: Once | INTRAVENOUS | Status: AC
  Administered 2015-04-06: 1000 mL via INTRAVENOUS

## 2015-04-06 MED ORDER — HEPARIN SODIUM (PORCINE) 5000 UNIT/ML IJ SOLN
5000.0000 [IU] | Freq: Three times a day (TID) | INTRAMUSCULAR | Status: DC
  Administered 2015-04-07 – 2015-04-10 (×11): 5000 [IU] via SUBCUTANEOUS
  Filled 2015-04-06 (×11): qty 1

## 2015-04-06 MED ORDER — ACETAMINOPHEN 325 MG PO TABS
ORAL_TABLET | ORAL | Status: AC
  Filled 2015-04-06: qty 2

## 2015-04-06 MED ORDER — ONDANSETRON HCL 4 MG PO TABS: 4.0000 mg | ORAL_TABLET | Freq: Four times a day (QID) | ORAL | Status: DC | PRN

## 2015-04-06 MED ORDER — BACLOFEN 10 MG PO TABS
10.0000 mg | ORAL_TABLET | Freq: Three times a day (TID) | ORAL | Status: DC | PRN
  Filled 2015-04-06: qty 1

## 2015-04-06 MED ORDER — LEVOFLOXACIN IN D5W 750 MG/150ML IV SOLN
750.0000 mg | Freq: Once | INTRAVENOUS | Status: AC
Start: 2015-04-06 — End: 2015-04-06
  Administered 2015-04-06: 750 mg via INTRAVENOUS
  Filled 2015-04-06: qty 150

## 2015-04-06 MED ORDER — INSULIN ASPART 100 UNIT/ML ~~LOC~~ SOLN
0.0000 [IU] | Freq: Every day | SUBCUTANEOUS | Status: DC
  Administered 2015-04-07: 4 [IU] via SUBCUTANEOUS

## 2015-04-06 MED ORDER — LORAZEPAM 1 MG PO TABS
1.0000 mg | ORAL_TABLET | Freq: Two times a day (BID) | ORAL | Status: DC | PRN
  Administered 2015-04-07 – 2015-04-10 (×3): 1 mg via ORAL
  Filled 2015-04-06: qty 1
  Filled 2015-04-06: qty 2
  Filled 2015-04-06: qty 1

## 2015-04-06 MED ORDER — BENZONATATE 100 MG PO CAPS
200.0000 mg | ORAL_CAPSULE | Freq: Three times a day (TID) | ORAL | Status: DC | PRN
  Administered 2015-04-07 – 2015-04-10 (×8): 200 mg via ORAL
  Filled 2015-04-06 (×9): qty 2

## 2015-04-06 MED ORDER — TEMAZEPAM 15 MG PO CAPS
15.0000 mg | ORAL_CAPSULE | Freq: Every evening | ORAL | Status: DC | PRN
  Administered 2015-04-07 – 2015-04-09 (×2): 15 mg via ORAL
  Filled 2015-04-06 (×2): qty 1

## 2015-04-06 MED ORDER — LOSARTAN POTASSIUM 50 MG PO TABS
100.0000 mg | ORAL_TABLET | Freq: Every day | ORAL | Status: DC
  Administered 2015-04-07 – 2015-04-10 (×4): 100 mg via ORAL
  Filled 2015-04-06 (×6): qty 2

## 2015-04-06 MED ORDER — SODIUM CHLORIDE 0.9 % IJ SOLN
3.0000 mL | Freq: Two times a day (BID) | INTRAMUSCULAR | Status: DC
  Administered 2015-04-06 – 2015-04-08 (×3): 3 mL via INTRAVENOUS

## 2015-04-06 MED ORDER — DICYCLOMINE HCL 10 MG PO CAPS
10.0000 mg | ORAL_CAPSULE | Freq: Three times a day (TID) | ORAL | Status: DC
  Administered 2015-04-07 – 2015-04-10 (×12): 10 mg via ORAL
  Filled 2015-04-06 (×26): qty 1

## 2015-04-06 MED ORDER — VANCOMYCIN HCL IN DEXTROSE 750-5 MG/150ML-% IV SOLN
750.0000 mg | Freq: Two times a day (BID) | INTRAVENOUS | Status: DC
  Administered 2015-04-07 – 2015-04-10 (×7): 750 mg via INTRAVENOUS
  Filled 2015-04-06 (×9): qty 150

## 2015-04-06 MED ORDER — BACLOFEN 5 MG HALF TABLET
5.0000 mg | ORAL_TABLET | Freq: Three times a day (TID) | ORAL | Status: DC | PRN
  Filled 2015-04-06: qty 2

## 2015-04-06 MED ORDER — ACETAMINOPHEN 325 MG PO TABS
650.0000 mg | ORAL_TABLET | Freq: Once | ORAL | Status: AC
  Administered 2015-04-06: 650 mg via ORAL

## 2015-04-06 MED ORDER — VANCOMYCIN HCL IN DEXTROSE 1-5 GM/200ML-% IV SOLN
1000.0000 mg | Freq: Once | INTRAVENOUS | Status: AC
Start: 2015-04-06 — End: 2015-04-06
  Administered 2015-04-06: 1000 mg via INTRAVENOUS
  Filled 2015-04-06: qty 200

## 2015-04-06 MED ORDER — LEVOFLOXACIN IN D5W 750 MG/150ML IV SOLN
750.0000 mg | INTRAVENOUS | Status: DC
  Administered 2015-04-07 – 2015-04-09 (×3): 750 mg via INTRAVENOUS
  Filled 2015-04-06 (×3): qty 150

## 2015-04-06 MED ORDER — POTASSIUM CHLORIDE CRYS ER 10 MEQ PO TBCR
10.0000 meq | EXTENDED_RELEASE_TABLET | Freq: Every day | ORAL | Status: DC
  Administered 2015-04-07 – 2015-04-10 (×5): 10 meq via ORAL
  Filled 2015-04-06 (×8): qty 1

## 2015-04-06 MED ORDER — SODIUM CHLORIDE 0.9 % IV BOLUS (SEPSIS)
1000.0000 mL | INTRAVENOUS | Status: AC
  Administered 2015-04-06: 1000 mL via INTRAVENOUS

## 2015-04-06 MED ORDER — IPRATROPIUM-ALBUTEROL 0.5-2.5 (3) MG/3ML IN SOLN
3.0000 mL | RESPIRATORY_TRACT | Status: DC
  Administered 2015-04-06 – 2015-04-10 (×22): 3 mL via RESPIRATORY_TRACT
  Filled 2015-04-06 (×23): qty 3

## 2015-04-06 NOTE — ED Notes (Signed)
MD at bedside. 

## 2015-04-06 NOTE — Progress Notes (Signed)
Ann Fuller for Levaquin & Vancomycin  Indication: pneumonia  Allergies  Allergen Reactions  . Ceftin [Cefuroxime Axetil] Swelling  . Codeine Swelling  . Metformin And Related Diarrhea  . Nexium [Esomeprazole Magnesium] Diarrhea  . Omnicef [Cefdinir] Diarrhea  . Sulfa Antibiotics Rash    Patient Measurements: Height: 4\' 11"  (149.9 cm) Weight: 226 lb (102.513 kg) IBW/kg (Calculated) : 43.2  Vital Signs: Temp: 101.2 F (38.4 C) (04/13 1844) Temp Source: Rectal (04/13 1844) BP: 137/79 mmHg (04/13 2103) Pulse Rate: 105 (04/13 2103) Intake/Output from previous day:   Intake/Output from this shift: Total I/O In: -  Out: 200 [Urine:200]  Labs:  Recent Labs  04/06/15 1830  WBC 9.2  HGB 11.0*  PLT 210  CREATININE 0.67   Estimated Creatinine Clearance: 73.1 mL/min (by C-G formula based on Cr of 0.67). No results for input(s): VANCOTROUGH, VANCOPEAK, VANCORANDOM, GENTTROUGH, GENTPEAK, GENTRANDOM, TOBRATROUGH, TOBRAPEAK, TOBRARND, AMIKACINPEAK, AMIKACINTROU, AMIKACIN in the last 72 hours.   Microbiology: Recent Results (from the past 720 hour(s))  Blood Culture (routine x 2)     Status: None (Preliminary result)   Collection Time: 04/06/15  7:10 PM  Result Value Ref Range Status   Specimen Description BLOOD RIGHT ARM  Final   Special Requests BOTTLES DRAWN AEROBIC AND ANAEROBIC 8CC EACH  Final   Culture PENDING  Incomplete   Report Status PENDING  Incomplete  Blood Culture (routine x 2)     Status: None (Preliminary result)   Collection Time: 04/06/15  7:15 PM  Result Value Ref Range Status   Specimen Description BLOOD RIGHT HAND  Final   Special Requests BOTTLES DRAWN AEROBIC AND ANAEROBIC [facility] EACH  Final   Culture PENDING  Incomplete   Report Status PENDING  Incomplete    Anti-infectives    Start     Dose/Rate Route Frequency Ordered Stop   04/07/15 1800  levofloxacin (LEVAQUIN) IVPB 750 mg     750 mg 100 mL/hr over 90 Minutes  Intravenous Every 24 hours 04/06/15 2115     04/07/15 0900  vancomycin (VANCOCIN) IVPB 750 mg/150 ml premix     750 mg 150 mL/hr over 60 Minutes Intravenous Every 12 hours 04/06/15 2115     04/06/15 2130  vancomycin (VANCOCIN) IVPB 1000 mg/200 mL premix     1,000 mg 200 mL/hr over 60 Minutes Intravenous  Once 04/06/15 2115     04/06/15 1900  levofloxacin (LEVAQUIN) IVPB 750 mg     750 mg 100 mL/hr over 90 Minutes Intravenous  Once 04/06/15 1850 04/06/15 2102   04/06/15 1900  vancomycin (VANCOCIN) IVPB 1000 mg/200 mL premix     1,000 mg 200 mL/hr over 60 Minutes Intravenous  Once 04/06/15 1850        Assessment: 69 yo obese F admitted with shortness of breath & productive cough x 2 days.  She was started on antibiotics for CAP and possible sepsis.  She is febrile on admission (Tm 101.48F).  WBC and lacticacid are wnl.  CXR + PNA.  Cx data pending.    Renal function is at patient's baseline. NCrCl ~ 90 ml/min.  Vancomycin 4/13>> Levaquin 4/13>>  Goal of Therapy:  Vancomycin trough level 15-20 mcg/ml  Plan:  Levaquin 750mg  IV q24h Change to PO once clinically appropriate Vancomycin 2gm IV loading dose x1 then 750mg  IV q12h Check Vancomycin trough at steady state Monitor renal function and cx data  Duration of therapy per MD  Biagio Borg 04/06/2015,9:20 PM

## 2015-04-06 NOTE — ED Notes (Signed)
Chest pain, sob, cough since last night, Had similar sx 2 weeks ago.

## 2015-04-06 NOTE — ED Notes (Signed)
Hospitalist at bedside 

## 2015-04-06 NOTE — H&P (Signed)
[facility] History and Physical  Ann Fuller RXV:[id number] DOB: 06-30-1946 DOA: 04/06/2015  Referring physician: Dr Winfred Leeds - [facility] PCP: Asencion Noble, MD   Chief Complaint: SOB  HPI: Ann Fuller is a 69 y.o. female  Shortness of breath. Constant and progressive. Associated with productive cough with yellow sputum. Onset of symptoms 2 days ago. One episode of posttussive emesis reported. Symptoms are somewhat relieved with home breathing treatments. Decreased by mouth during this period of time. She reports going to her PCP 2 weeks ago and being treated for bronchitis with a Z-Pak, and steroid Dosepak. Initially symptoms improved but then worsened as explained above.   Review of Systems:  Constitutional:  No weight loss, night sweats,   HEENT:  No headaches, Difficulty swallowing,Tooth/dental problems,Sore throat,  No sneezing, itching, ear ache, nasal congestion, post nasal drip,  Cardio-vascular:  No chest pain, Orthopnea, PND, swelling in lower extremities, anasarca, dizziness, palpitations  GI:  No heartburn, indigestion, abdominal pain, nausea, vomiting, diarrhea, change in bowel habits, Resp: Per HPI Skin:  no rash or lesions.  GU:  no dysuria, change in color of urine, no urgency or frequency. No flank pain.  Musculoskeletal:   No joint pain or swelling. No decreased range of motion. No back pain.  Psych:  No change in mood or affect. No depression or anxiety. No memory loss.   Past Medical History  Diagnosis Date  . Essential hypertension, benign   . Type 2 diabetes mellitus   . Adenomatous colon polyp   . Diverticulitis   . Internal hemorrhoid   . IBS (irritable bowel syndrome)   . GERD (gastroesophageal reflux disease)   . Dyspepsia   . Hiatal hernia   . Tubular adenoma 11/2010  . Bulging lumbar disc   . PONV (postoperative nausea and vomiting)   . Asthma   . Arthritis   . Right shoulder pain   . Breast cancer [id number]   Past  Surgical History  Procedure Laterality Date  . Cholecystectomy    . Tubal ligation    . Esophagogastroduodenoscopy  12/13/2010    Dr. Jennet Maduro hernia, fundal gland type polyps  . Colonoscopy  12/13/2010    Dr. Margarito Courser pancolonic diverticular.tubular adenoma  . Colonoscopy  07/13/2003  . Appendectomy    . Mastectomy  1995  . Back surgery  2007    Lumbar fusion  . Tonsillectomy    . Abdominal hysterectomy    . Cataract extraction w/phaco Left 08/27/2013    Procedure: CATARACT EXTRACTION PHACO AND INTRAOCULAR LENS PLACEMENT (IOC);  Surgeon: Tonny Branch, MD;  Location: [facility];  Service: Ophthalmology;  Laterality: Left;  CDE 4.25  . Eye surgery Left     Left KPE 08/27/13  . Cataract extraction w/phaco Right 09/24/2013    Procedure: CATARACT EXTRACTION PHACO AND INTRAOCULAR LENS PLACEMENT (IOC);  Surgeon: Tonny Branch, MD;  Location: [facility];  Service: Ophthalmology;  Laterality: Right;  CDE:  8.16   Social History:  reports that she has never smoked. She has never used smokeless tobacco. She reports that she does not drink alcohol or use illicit drugs.  Allergies  Allergen Reactions  . Ceftin [Cefuroxime Axetil] Swelling  . Codeine Swelling  . Metformin And Related Diarrhea  . Nexium [Esomeprazole Magnesium] Diarrhea  . Omnicef [Cefdinir] Diarrhea  . Sulfa Antibiotics Rash    Family History  Problem Relation Age of Onset  . Colon cancer Mother   . Heart attack Father   . Heart attack Sister   .  Stroke Sister   . Cancer - Lung Sister      Prior to Admission medications   Medication Sig Start Date End Date Taking? Authorizing Provider  albuterol (VENTOLIN HFA) 108 (90 BASE) MCG/ACT inhaler Inhale 2 puffs into the lungs every 6 (six) hours as needed. Shortness of Breath   Yes Historical Provider, MD  amLODipine (NORVASC) 5 MG tablet Take 5 mg by mouth at bedtime.    Yes Historical Provider, MD  amoxicillin (AMOXIL) 500 MG capsule Take 2,000 mg by mouth See admin  instructions. PRIOR TO DENTAL FILLED 3/29   Yes Historical Provider, MD  aspirin 81 MG tablet Take 81 mg by mouth at bedtime.    Yes Historical Provider, MD  atorvastatin (LIPITOR) 40 MG tablet Take 40 mg by mouth daily.     Yes Historical Provider, MD  baclofen (LIORESAL) 10 MG tablet Take 5-10 mg by mouth 3 (three) times daily as needed for muscle spasms.   Yes Historical Provider, MD  benzonatate (TESSALON) 200 MG capsule Take 200 mg by mouth every 4 (four) hours as needed for cough.   Yes Historical Provider, MD  chlorthalidone (HYGROTON) 25 MG tablet Take 25 mg by mouth daily.     Yes Historical Provider, MD  dexlansoprazole (DEXILANT) 60 MG capsule Take 60 mg by mouth daily.    Yes Historical Provider, MD  dicyclomine (BENTYL) 10 MG capsule TAKE ONE CAPSULE BY MOUTH PRIOR TO MEALS AS NEEDED NO MORE THAN FOUR TIMES DAILY. MAY CAUSE DROWSINESS 11/09/14  Yes Mahala Menghini, PA-C  doxazosin (CARDURA) 2 MG tablet Take 2 mg by mouth daily.   Yes Historical Provider, MD  exenatide (BYETTA 10 MCG PEN) 10 MCG/0.04ML SOLN Inject 10 mcg into the skin 2 (two) times daily with a meal.    Yes Historical Provider, MD  HYDROcodone-acetaminophen (NORCO/VICODIN) 5-325 MG per tablet Take 1 tablet by mouth every 4 (four) hours as needed for pain.   Yes Historical Provider, MD  Insulin Aspart (NOVOLOG FLEXPEN [place]) Inject 10 Units into the skin 2 (two) times daily. 10 units twice daily before meals   Yes Historical Provider, MD  Insulin Glargine (LANTUS) 100 UNIT/ML [place] Pen Inject 50 Units into the skin every morning.   Yes Historical Provider, MD  LORazepam (ATIVAN) 1 MG tablet Take 1 mg by mouth 2 (two) times daily as needed. Anxiety/Sleep   Yes Historical Provider, MD  losartan (COZAAR) 100 MG tablet Take 100 mg by mouth daily.   Yes Historical Provider, MD  potassium chloride (K-DUR) 10 MEQ tablet Take 10 mEq by mouth daily.   Yes Historical Provider, MD  SYMBICORT 160-4.5 MCG/ACT inhaler USE 2 PUFFS 2 TIMES  A DAY 01/20/15  Yes Elsie Stain, MD  temazepam (RESTORIL) 15 MG capsule Take 15 mg by mouth at bedtime as needed. Sleep   Yes Historical Provider, MD  HYDROcodone-homatropine (HYCODAN) 5-1.5 MG/5ML syrup Take 5 mLs by mouth every 6 (six) hours as needed for cough.    Historical Provider, MD  predniSONE (DELTASONE) 10 MG tablet Take 10 mg by mouth as directed. 4 TABS DAILY FOR 3 DAYS, THEN 3 DAILY FOR 3 DAYS THEN 2 DAILY FOR 3 DAYS THEN 1 DAILY FOR 3 DAYS THEN O0.5 DAILY FOR 3 DAYS    Historical Provider, MD   Physical Exam: Filed Vitals:   04/06/15 1758 04/06/15 1844 04/06/15 1947 04/06/15 2103  BP: 167/86  151/54 137/79  Pulse: 127 118 109 105  Temp: 100.1 F (37.8 C)  101.2 F (38.4 C)    TempSrc: Oral Rectal    Resp: 22 20 20 20   Height: 4\' 11"  (1.499 m)     Weight: 102.513 kg (226 lb)     SpO2: 94% 94% 93% 94%    Wt Readings from Last 3 Encounters:  04/06/15 102.513 kg (226 lb)  01/21/15 105.235 kg (232 lb)  10/18/14 105.688 kg (233 lb)    General: Appears mildly anxious Eyes:  PERRL, normal lids, irises & conjunctiva ENT: Dry mucous membranes Neck:  no LAD, masses or thyromegaly Cardiovascular:  RRR, 3/6 systolic murmur . 1+ bilat LE edema. Telemetry:  SR, no arrhythmias  Respiratory: Increased work of breathing, rhonchi in the bases with left greater than right, no wheezes, Abdomen:  soft, ntnd Skin:  no rash or induration seen on limited exam Musculoskeletal:  grossly normal tone BUE/BLE Psychiatric:  grossly normal mood and affect, speech fluent and appropriate Neurologic:  grossly non-focal.          Labs on Admission:  Basic Metabolic Panel:  Recent Labs Lab 04/06/15 1830  NA 133*  K 3.5  CL 95*  CO2 27  GLUCOSE 112*  BUN 10  CREATININE 0.67  CALCIUM 8.8   Liver Function Tests:  Recent Labs Lab 04/06/15 1830  AST 30  ALT 22  ALKPHOS 63  BILITOT 0.5  PROT 7.5  ALBUMIN 3.8   No results for input(s): LIPASE, AMYLASE in the last 168  hours. No results for input(s): AMMONIA in the last 168 hours. CBC:  Recent Labs Lab 04/06/15 1830  WBC 9.2  NEUTROABS 7.5  HGB 11.0*  HCT 33.0*  MCV 89.9  PLT 210   Cardiac Enzymes: No results for input(s): CKTOTAL, CKMB, CKMBINDEX, TROPONINI in the last 168 hours.  BNP (last 3 results) No results for input(s): BNP in the last 8760 hours.  ProBNP (last 3 results) No results for input(s): PROBNP in the last 8760 hours.  CBG: No results for input(s): GLUCAP in the last 168 hours.  Radiological Exams on Admission: Dg Chest Port 1 View  04/06/2015   CLINICAL DATA:  Shortness of breath and cough  EXAM: PORTABLE CHEST - 1 VIEW  COMPARISON:  03/25/2012  FINDINGS: The heart size and mediastinal contours are within normal limits. Pulmonary vascular congestion noted. The visualized skeletal structures are unremarkable.  IMPRESSION: 1. Pulmonary vascular congestion.   Electronically Signed   By: Kerby Moors M.D.   On: 04/06/2015 20:06    EKG: Independently reviewed. Sinus, tachycardic, no sign of ACS  Assessment/Plan Principal Problem:   Sepsis Active Problems:   Irritable bowel syndrome   GERD (gastroesophageal reflux disease)   Type 2 diabetes mellitus   Essential hypertension, benign   IBS (irritable bowel syndrome)   Community acquired pneumonia   Insomnia   Sepsis: Secondary to CAP. CXR concerning for pneumonia. Patient treated for bronchitis 2 weeks ago with Z-Pak and steroid taper. Initially improved but now acutely worsening. Lactic acid 1.66, WBC 9.2. UA normal. Tachycardic, tachypneic, febrile. NS bolus in ED w/ improvement - Stepdown - Vanc and Levaquin - sputum cx - ABG - Follow up blood culture - legionella and strep Ag - DuoNeb's - Continue Tessalon Per[place]  Dm: no previous A1c. On novolog [place] and Lantus at home - SSI - A1c - start Lantus when taking PO  Hypertension: Normotensive - Continue losartan, Norvasc, chlorthalidone, cardura - Continue  ASA  Chronic muscular skeletal pain: - Continue baclofen  Hyponatremia: NA 133 and admission. -  IVF normal saline  GERD: - PPI  IBS: - continue bentyl  HLD: Continue Lipitor  Anxiety: - Continue Ativan  Insomnia, RLS: Continue Restoril  Code Status: FULL DVT Prophylaxis: Hep Family Communication: Husband and multiple other family members Disposition Plan: pending improvement  MERRELL, DAVID J, MD Family Medicine [facility] [email or web address] Password [email or web address]

## 2015-04-06 NOTE — ED Provider Notes (Addendum)
CSN: [id number]     Arrival date & time 04/06/15  1742 History   First MD Initiated Contact with Patient 04/06/15 1815     Chief Complaint  Patient presents with  . Shortness of Breath     (Consider location/radiation/quality/duration/timing/severity/associated sxs/prior Treatment) HPI comPlains of cough productive of yellow sputum for the past 2 days. Symptoms accompanied by shortness of breath and chills. One episode of posttussive vomiting. No other associated symptoms. No treatment prior to coming here. Past Medical History  Diagnosis Date  . Essential hypertension, benign   . Type 2 diabetes mellitus   . Adenomatous colon polyp   . Diverticulitis   . Internal hemorrhoid   . IBS (irritable bowel syndrome)   . GERD (gastroesophageal reflux disease)   . Dyspepsia   . Hiatal hernia   . Tubular adenoma 11/2010  . Bulging lumbar disc   . PONV (postoperative nausea and vomiting)   . Asthma   . Arthritis   . Right shoulder pain   . Breast cancer [id number]   Past Surgical History  Procedure Laterality Date  . Cholecystectomy    . Tubal ligation    . Esophagogastroduodenoscopy  12/13/2010    Dr. Jennet Maduro hernia, fundal gland type polyps  . Colonoscopy  12/13/2010    Dr. Margarito Courser pancolonic diverticular.tubular adenoma  . Colonoscopy  07/13/2003  . Appendectomy    . Mastectomy  1995  . Back surgery  2007    Lumbar fusion  . Tonsillectomy    . Abdominal hysterectomy    . Cataract extraction w/phaco Left 08/27/2013    Procedure: CATARACT EXTRACTION PHACO AND INTRAOCULAR LENS PLACEMENT (IOC);  Surgeon: Tonny Branch, MD;  Location: [facility];  Service: Ophthalmology;  Laterality: Left;  CDE 4.25  . Eye surgery Left     Left KPE 08/27/13  . Cataract extraction w/phaco Right 09/24/2013    Procedure: CATARACT EXTRACTION PHACO AND INTRAOCULAR LENS PLACEMENT (IOC);  Surgeon: Tonny Branch, MD;  Location: [facility];  Service: Ophthalmology;  Laterality: Right;  CDE:  8.16   Family  History  Problem Relation Age of Onset  . Colon cancer Mother   . Heart attack Father   . Heart attack Sister   . Stroke Sister   . Cancer - Lung Sister    History  Substance Use Topics  . Smoking status: Never Smoker   . Smokeless tobacco: Never Used  . Alcohol Use: No   OB History    No data available     Review of Systems  Respiratory: Positive for shortness of breath.   Cardiovascular: Positive for chest pain.       Chest pain worse with coughing  Allergic/Immunologic: Positive for immunocompromised state.       History of breast cancer, lymph node dissection , diabetic      Allergies  Ceftin; Codeine; Metformin and related; Nexium; Omnicef; and Sulfa antibiotics  Home Medications   Prior to Admission medications   Medication Sig Start Date End Date Taking? Authorizing Provider  albuterol (VENTOLIN HFA) 108 (90 BASE) MCG/ACT inhaler Inhale 2 puffs into the lungs every 6 (six) hours as needed. Shortness of Breath    Historical Provider, MD  amLODipine (NORVASC) 5 MG tablet Take 5 mg by mouth daily.      Historical Provider, MD  amoxicillin (AMOXIL) 500 MG capsule Take 500 mg by mouth 4 (four) times daily.    Historical Provider, MD  aspirin 81 MG tablet Take 81 mg by mouth daily.  Historical Provider, MD  atorvastatin (LIPITOR) 40 MG tablet Take 40 mg by mouth daily.      Historical Provider, MD  benzonatate (TESSALON) 200 MG capsule Take 200 mg by mouth every 4 (four) hours as needed for cough.    Historical Provider, MD  chlorthalidone (HYGROTON) 25 MG tablet Take 25 mg by mouth daily.      Historical Provider, MD  dexlansoprazole (DEXILANT) 60 MG capsule Take 60 mg by mouth daily.     Historical Provider, MD  dicyclomine (BENTYL) 10 MG capsule TAKE ONE CAPSULE BY MOUTH PRIOR TO MEALS AS NEEDED NO MORE THAN FOUR TIMES DAILY. MAY CAUSE DROWSINESS 11/09/14   Mahala Menghini, PA-C  doxazosin (CARDURA) 2 MG tablet Take 2 mg by mouth daily.    Historical Provider, MD   exenatide (BYETTA 10 MCG PEN) 10 MCG/0.04ML SOLN Inject 10 mcg into the skin 2 (two) times daily with a meal.     Historical Provider, MD  HYDROcodone-acetaminophen (NORCO/VICODIN) 5-325 MG per tablet Take 1 tablet by mouth every 4 (four) hours as needed for pain.    Historical Provider, MD  Insulin Aspart (NOVOLOG FLEXPEN [place]) 10 units twice daily before meals    Historical Provider, MD  Insulin Glargine (LANTUS SOLOSTAR [place]) 55 units every morning before breakfast    Historical Provider, MD  LORazepam (ATIVAN) 1 MG tablet Take 1 mg by mouth 2 (two) times daily as needed. Anxiety/Sleep    Historical Provider, MD  metroNIDAZOLE (FLAGYL) 500 MG tablet Take 500 mg by mouth 3 (three) times daily.    Historical Provider, MD  SYMBICORT 160-4.5 MCG/ACT inhaler USE 2 PUFFS 2 TIMES A DAY Patient not taking: Reported on 01/21/2015 01/20/15   Elsie Stain, MD  temazepam (RESTORIL) 15 MG capsule Take 15 mg by mouth at bedtime as needed. Sleep    Historical Provider, MD   BP 167/86 mmHg  Pulse 127  Temp(Src) 100.1 F (37.8 C) (Oral)  Resp 22  Ht 4\' 11"  (1.499 m)  Wt 226 lb (102.513 kg)  BMI 45.62 kg/m2  SpO2 94% Physical Exam  Constitutional: She appears well-developed and well-nourished. No distress.  HENT:  Head: Normocephalic and atraumatic.  Eyes: Conjunctivae are normal. Pupils are equal, round, and reactive to light.  Neck: Neck supple. No tracheal deviation present. No thyromegaly present.  Cardiovascular: Normal rate and regular rhythm.   No murmur heard. Pulmonary/Chest: Effort normal.  Scant diffuse rhonchi, coughing frequently  Abdominal: Soft. Bowel sounds are normal. She exhibits no distension. There is no tenderness.  Obese  Musculoskeletal: Normal range of motion. She exhibits no edema or tenderness.  Neurological: She is alert. Coordination normal.  Skin: Skin is warm and dry. No rash noted.  Psychiatric: She has a normal mood and affect.  Nursing note and vitals  reviewed.   ED Course  Procedures (including critical care time) Labs Review Labs Reviewed  CBC WITH DIFFERENTIAL/PLATELET  COMPREHENSIVE METABOLIC PANEL  TROPONIN I    Imaging Review No results found.   EKG Interpretation   Date/Time:  Wednesday April 06 2015 18:03:40 EDT Ventricular Rate:  119 PR Interval:  138 QRS Duration: 76 QT Interval:  334 QTC Calculation: 469 R Axis:   17 Text Interpretation:  Sinus tachycardia Possible Left atrial enlargement  Left ventricular hypertrophy with repolarization abnormality Abnormal ECG  SINCE LAST TRACING HEART RATE HAS INCREASED Confirmed by Winfred Leeds  MD,  Nabiha Planck [phone]) on 04/06/2015 6:24:36 PM     8:30 PM patient looks and  feels much improved after treatment with venous antibiotic and intravenous fluids.  Results for orders placed or performed during the hospital encounter of 04/06/15  Blood Culture (routine x 2)  Result Value Ref Range   Specimen Description BLOOD RIGHT ARM    Special Requests BOTTLES DRAWN AEROBIC AND ANAEROBIC 8CC EACH    Culture PENDING    Report Status PENDING   Blood Culture (routine x 2)  Result Value Ref Range   Specimen Description BLOOD RIGHT HAND    Special Requests BOTTLES DRAWN AEROBIC AND ANAEROBIC 6CC EACH    Culture PENDING    Report Status PENDING   CBC with Differential  Result Value Ref Range   WBC 9.2 4.0 - 10.5 K/uL   RBC 3.67 (L) 3.87 - 5.11 MIL/uL   Hemoglobin 11.0 (L) 12.0 - 15.0 g/dL   HCT 33.0 (L) 36.0 - 46.0 %   MCV 89.9 78.0 - 100.0 fL   MCH 30.0 26.0 - 34.0 pg   MCHC 33.3 30.0 - 36.0 g/dL   RDW 14.6 11.5 - 15.5 %   Platelets 210 150 - 400 K/uL   Neutrophils Relative % 82 (H) 43 - 77 %   Neutro Abs 7.5 1.7 - 7.7 K/uL   Lymphocytes Relative 4 (L) 12 - 46 %   Lymphs Abs 0.4 (L) 0.7 - 4.0 K/uL   Monocytes Relative 13 (H) 3 - 12 %   Monocytes Absolute 1.2 (H) 0.1 - 1.0 K/uL   Eosinophils Relative 1 0 - 5 %   Eosinophils Absolute 0.1 0.0 - 0.7 K/uL   Basophils Relative 0 0  - 1 %   Basophils Absolute 0.0 0.0 - 0.1 K/uL  Comprehensive metabolic panel  Result Value Ref Range   Sodium 133 (L) 135 - 145 mmol/L   Potassium 3.5 3.5 - 5.1 mmol/L   Chloride 95 (L) 96 - 112 mmol/L   CO2 27 19 - 32 mmol/L   Glucose, Bld 112 (H) 70 - 99 mg/dL   BUN 10 6 - 23 mg/dL   Creatinine, Ser 0.67 0.50 - 1.10 mg/dL   Calcium 8.8 8.4 - 10.5 mg/dL   Total Protein 7.5 6.0 - 8.3 g/dL   Albumin 3.8 3.5 - 5.2 g/dL   AST 30 0 - 37 U/L   ALT 22 0 - 35 U/L   Alkaline Phosphatase 63 39 - 117 U/L   Total Bilirubin 0.5 0.3 - 1.2 mg/dL   GFR calc non Af Amer 90 (L) >90 mL/min   GFR calc Af Amer >90 >90 mL/min   Anion gap 11 5 - 15  Urinalysis, Routine w reflex microscopic  Result Value Ref Range   Color, Urine YELLOW YELLOW   APPearance CLEAR CLEAR   Specific Gravity, Urine 1.015 1.005 - 1.030   pH 5.5 5.0 - 8.0   Glucose, UA NEGATIVE NEGATIVE mg/dL   Hgb urine dipstick TRACE (A) NEGATIVE   Bilirubin Urine NEGATIVE NEGATIVE   Ketones, ur NEGATIVE NEGATIVE mg/dL   Protein, ur NEGATIVE NEGATIVE mg/dL   Urobilinogen, UA 0.2 0.0 - 1.0 mg/dL   Nitrite NEGATIVE NEGATIVE   Leukocytes, UA NEGATIVE NEGATIVE  Urine microscopic-add on  Result Value Ref Range   Squamous Epithelial / LPF RARE RARE   WBC, UA 3-6 <3 WBC/hpf   RBC / HPF 0-2 <3 RBC/hpf   Bacteria, UA FEW (A) RARE  I-Stat CG4 Lactic Acid, ED (not at [facility])  Result Value Ref Range   Lactic Acid, Venous 1.66 0.5 -  2.0 mmol/L   Dg Chest Port 1 View  04/06/2015   CLINICAL DATA:  Shortness of breath and cough  EXAM: PORTABLE CHEST - 1 VIEW  COMPARISON:  03/25/2012  FINDINGS: The heart size and mediastinal contours are within normal limits. Pulmonary vascular congestion noted. The visualized skeletal structures are unremarkable.  IMPRESSION: 1. Pulmonary vascular congestion.   Electronically Signed   By: Kerby Moors M.D.   On: 04/06/2015 20:06    MDM  Code sepsis called based on temperature, pulse Port score for pneumonia 96.  Clinically patient has pneumonia with productive cough, fever. Favor pneumonia over CHF after chest x-ray viewed  Final diagnoses:  None  spoke with Dr. Barbaraann Faster and admit step down unit Diagnosis #1 community acquired pneumonia       Orlie Dakin, MD 04/06/15 2044  Orlie Dakin, MD 04/06/15 2047

## 2015-04-07 LAB — EXPECTORATED SPUTUM ASSESSMENT W GRAM STAIN, RFLX TO RESP C

## 2015-04-07 LAB — EXPECTORATED SPUTUM ASSESSMENT W REFEX TO RESP CULTURE

## 2015-04-07 LAB — STREP PNEUMONIAE URINARY ANTIGEN: STREP PNEUMO URINARY ANTIGEN: NEGATIVE

## 2015-04-07 LAB — GLUCOSE, CAPILLARY
GLUCOSE-CAPILLARY: 117 mg/dL — AB (ref 70–99)
Glucose-Capillary: 73 mg/dL (ref 70–99)
Glucose-Capillary: 171 mg/dL — ABNORMAL HIGH (ref 70–99)
Glucose-Capillary: 328 mg/dL — ABNORMAL HIGH (ref 70–99)

## 2015-04-07 LAB — MRSA PCR SCREENING: MRSA by PCR: NEGATIVE

## 2015-04-07 MED ORDER — GUAIFENESIN-DM 100-10 MG/5ML PO SYRP
5.0000 mL | ORAL_SOLUTION | ORAL | Status: DC | PRN
  Administered 2015-04-07 – 2015-04-10 (×11): 5 mL via ORAL
  Filled 2015-04-07 (×11): qty 5

## 2015-04-07 NOTE — Progress Notes (Signed)
Subjective: Ann Fuller was admitted yesterday with pneumonia/possible sepsis. Chest x-ray reveals pulmonary vascular congestion. White count is normal. She was initially dyspneic, tachypneic and tachycardic. She is feeling better this morning. She is afebrile now.  Objective: Vital signs in last 24 hours: Filed Vitals:   04/07/15 0400 04/07/15 0500 04/07/15 0600 04/07/15 0810  BP: 155/67  158/76   Pulse: 104  114   Temp: 98.3 F (36.8 C)     TempSrc: Oral     Resp: 20  15   Height:      Weight:  238 lb 1.6 oz (108 kg)    SpO2: 94%  96% 94%   Weight change:   Intake/Output Summary (Last 24 hours) at 04/07/15 9518 Last data filed at 04/07/15 0600  Gross per 24 hour  Intake 797.92 ml  Output    600 ml  Net 197.92 ml    Physical Exam: Alert. No distress. Lungs reveal decreased wheezes. Heart tachycardic at 110. Abdomen soft and nontender. Extremities reveal no edema.  Lab Results:    Results for orders placed or performed during the hospital encounter of 04/06/15 (from the past 24 hour(s))  CBC with Differential     Status: Abnormal   Collection Time: 04/06/15  6:30 PM  Result Value Ref Range   WBC 9.2 4.0 - 10.5 K/uL   RBC 3.67 (L) 3.87 - 5.11 MIL/uL   Hemoglobin 11.0 (L) 12.0 - 15.0 g/dL   HCT 33.0 (L) 36.0 - 46.0 %   MCV 89.9 78.0 - 100.0 fL   MCH 30.0 26.0 - 34.0 pg   MCHC 33.3 30.0 - 36.0 g/dL   RDW 14.6 11.5 - 15.5 %   Platelets 210 150 - 400 K/uL   Neutrophils Relative % 82 (H) 43 - 77 %   Neutro Abs 7.5 1.7 - 7.7 K/uL   Lymphocytes Relative 4 (L) 12 - 46 %   Lymphs Abs 0.4 (L) 0.7 - 4.0 K/uL   Monocytes Relative 13 (H) 3 - 12 %   Monocytes Absolute 1.2 (H) 0.1 - 1.0 K/uL   Eosinophils Relative 1 0 - 5 %   Eosinophils Absolute 0.1 0.0 - 0.7 K/uL   Basophils Relative 0 0 - 1 %   Basophils Absolute 0.0 0.0 - 0.1 K/uL  Comprehensive metabolic panel     Status: Abnormal   Collection Time: 04/06/15  6:30 PM  Result Value Ref Range   Sodium 133 (L) 135 - 145 mmol/L   Potassium 3.5 3.5 - 5.1 mmol/L   Chloride 95 (L) 96 - 112 mmol/L   CO2 27 19 - 32 mmol/L   Glucose, Bld 112 (H) 70 - 99 mg/dL   BUN 10 6 - 23 mg/dL   Creatinine, Ser 0.67 0.50 - 1.10 mg/dL   Calcium 8.8 8.4 - 10.5 mg/dL   Total Protein 7.5 6.0 - 8.3 g/dL   Albumin 3.8 3.5 - 5.2 g/dL   AST 30 0 - 37 U/L   ALT 22 0 - 35 U/L   Alkaline Phosphatase 63 39 - 117 U/L   Total Bilirubin 0.5 0.3 - 1.2 mg/dL   GFR calc non Af Amer 90 (L) >90 mL/min   GFR calc Af Amer >90 >90 mL/min   Anion gap 11 5 - 15  Procalcitonin     Status: None   Collection Time: 04/06/15  6:39 PM  Result Value Ref Range   Procalcitonin <0.10 ng/mL  Blood Culture (routine x 2)     Status:  None (Preliminary result)   Collection Time: 04/06/15  7:10 PM  Result Value Ref Range   Specimen Description BLOOD RIGHT ARM    Special Requests BOTTLES DRAWN AEROBIC AND ANAEROBIC 8CC EACH    Culture PENDING    Report Status PENDING   I-Stat CG4 Lactic Acid, ED (not at [facility])     Status: None   Collection Time: 04/06/15  7:14 PM  Result Value Ref Range   Lactic Acid, Venous 1.66 0.5 - 2.0 mmol/L  Blood Culture (routine x 2)     Status: None (Preliminary result)   Collection Time: 04/06/15  7:15 PM  Result Value Ref Range   Specimen Description BLOOD RIGHT HAND    Special Requests BOTTLES DRAWN AEROBIC AND ANAEROBIC 6CC EACH    Culture PENDING    Report Status PENDING   Urinalysis, Routine w reflex microscopic     Status: Abnormal   Collection Time: 04/06/15  7:35 PM  Result Value Ref Range   Color, Urine YELLOW YELLOW   APPearance CLEAR CLEAR   Specific Gravity, Urine 1.015 1.005 - 1.030   pH 5.5 5.0 - 8.0   Glucose, UA NEGATIVE NEGATIVE mg/dL   Hgb urine dipstick TRACE (A) NEGATIVE   Bilirubin Urine NEGATIVE NEGATIVE   Ketones, ur NEGATIVE NEGATIVE mg/dL   Protein, ur NEGATIVE NEGATIVE mg/dL   Urobilinogen, UA 0.2 0.0 - 1.0 mg/dL   Nitrite NEGATIVE NEGATIVE   Leukocytes, UA NEGATIVE NEGATIVE  Urine microscopic-add  on     Status: Abnormal   Collection Time: 04/06/15  7:35 PM  Result Value Ref Range   Squamous Epithelial / LPF RARE RARE   WBC, UA 3-6 <3 WBC/hpf   RBC / HPF 0-2 <3 RBC/hpf   Bacteria, UA FEW (A) RARE  CBG monitoring, ED     Status: Abnormal   Collection Time: 04/06/15 10:14 PM  Result Value Ref Range   Glucose-Capillary 119 (H) 70 - 99 mg/dL   Comment 1 Notify RN    Comment 2 Document in Chart   Blood gas, arterial     Status: Abnormal   Collection Time: 04/06/15 10:30 PM  Result Value Ref Range   Delivery systems ROOM AIR    pH, Arterial 7.425 7.350 - 7.450   pCO2 arterial 40.5 35.0 - 45.0 mmHg   pO2, Arterial 66.3 (L) 80.0 - 100.0 mmHg   Bicarbonate 26.1 (H) 20.0 - 24.0 mEq/L   TCO2 22.9 0 - 100 mmol/L   Acid-Base Excess 2.1 (H) 0.0 - 2.0 mmol/L   O2 Saturation 92.5 %   Patient temperature 37.0    Collection site RIGHT RADIAL    Drawn by [id number]    Sample type ARTERIAL    Allens test (pass/fail) PASS PASS  MRSA PCR Screening     Status: None   Collection Time: 04/06/15 11:00 PM  Result Value Ref Range   MRSA by PCR NEGATIVE NEGATIVE  Glucose, capillary     Status: None   Collection Time: 04/07/15  7:30 AM  Result Value Ref Range   Glucose-Capillary 73 70 - 99 mg/dL     ABGS  Recent Labs  04/06/15 2230  PHART 7.425  PO2ART 66.3*  TCO2 22.9  HCO3 26.1*   CULTURES Recent Results (from the past 240 hour(s))  Blood Culture (routine x 2)     Status: None (Preliminary result)   Collection Time: 04/06/15  7:10 PM  Result Value Ref Range Status   Specimen Description BLOOD RIGHT ARM  Final  Special Requests BOTTLES DRAWN AEROBIC AND ANAEROBIC 8CC EACH  Final   Culture PENDING  Incomplete   Report Status PENDING  Incomplete  Blood Culture (routine x 2)     Status: None (Preliminary result)   Collection Time: 04/06/15  7:15 PM  Result Value Ref Range Status   Specimen Description BLOOD RIGHT HAND  Final   Special Requests BOTTLES DRAWN AEROBIC AND ANAEROBIC [facility]  EACH  Final   Culture PENDING  Incomplete   Report Status PENDING  Incomplete  MRSA PCR Screening     Status: None   Collection Time: 04/06/15 11:00 PM  Result Value Ref Range Status   MRSA by PCR NEGATIVE NEGATIVE Final    Comment:        The GeneXpert MRSA Assay (FDA approved for NASAL specimens only), is one component of a comprehensive MRSA colonization surveillance program. It is not intended to diagnose MRSA infection nor to guide or monitor treatment for MRSA infections.    Studies/Results: Dg Chest Port 1 View  04/06/2015   CLINICAL DATA:  Shortness of breath and cough  EXAM: PORTABLE CHEST - 1 VIEW  COMPARISON:  03/25/2012  FINDINGS: The heart size and mediastinal contours are within normal limits. Pulmonary vascular congestion noted. The visualized skeletal structures are unremarkable.  IMPRESSION: 1. Pulmonary vascular congestion.   Electronically Signed   By: Kerby Moors M.D.   On: 04/06/2015 20:06   Micro Results: Recent Results (from the past 240 hour(s))  Blood Culture (routine x 2)     Status: None (Preliminary result)   Collection Time: 04/06/15  7:10 PM  Result Value Ref Range Status   Specimen Description BLOOD RIGHT ARM  Final   Special Requests BOTTLES DRAWN AEROBIC AND ANAEROBIC 8CC EACH  Final   Culture PENDING  Incomplete   Report Status PENDING  Incomplete  Blood Culture (routine x 2)     Status: None (Preliminary result)   Collection Time: 04/06/15  7:15 PM  Result Value Ref Range Status   Specimen Description BLOOD RIGHT HAND  Final   Special Requests BOTTLES DRAWN AEROBIC AND ANAEROBIC [facility] EACH  Final   Culture PENDING  Incomplete   Report Status PENDING  Incomplete  MRSA PCR Screening     Status: None   Collection Time: 04/06/15 11:00 PM  Result Value Ref Range Status   MRSA by PCR NEGATIVE NEGATIVE Final    Comment:        The GeneXpert MRSA Assay (FDA approved for NASAL specimens only), is one component of a comprehensive MRSA  colonization surveillance program. It is not intended to diagnose MRSA infection nor to guide or monitor treatment for MRSA infections.    Studies/Results: Dg Chest Port 1 View  04/06/2015   CLINICAL DATA:  Shortness of breath and cough  EXAM: PORTABLE CHEST - 1 VIEW  COMPARISON:  03/25/2012  FINDINGS: The heart size and mediastinal contours are within normal limits. Pulmonary vascular congestion noted. The visualized skeletal structures are unremarkable.  IMPRESSION: 1. Pulmonary vascular congestion.   Electronically Signed   By: Kerby Moors M.D.   On: 04/06/2015 20:06   Medications:  I have reviewed the patient's current medications Scheduled Meds: . amLODipine  5 mg Oral QHS  . aspirin EC  81 mg Oral QHS  . chlorthalidone  25 mg Oral Daily  . dicyclomine  10 mg Oral TID AC & HS  . doxazosin  2 mg Oral Daily  . heparin  5,000 Units Subcutaneous 3  times per day  . insulin aspart  0-15 Units Subcutaneous TID WC  . insulin aspart  0-5 Units Subcutaneous QHS  . ipratropium-albuterol  3 mL Nebulization Q4H  . levofloxacin (LEVAQUIN) IV  750 mg Intravenous Q24H  . losartan  100 mg Oral Daily  . pantoprazole  40 mg Oral Daily  . potassium chloride  10 mEq Oral Daily  . sodium chloride  3 mL Intravenous Q12H  . vancomycin  750 mg Intravenous Q12H   Continuous Infusions: . sodium chloride 125 mL/hr at 04/07/15 0732   PRN Meds:.acetaminophen **OR** acetaminophen, baclofen, benzonatate, guaiFENesin-dextromethorphan, LORazepam, ondansetron **OR** ondansetron (ZOFRAN) IV, temazepam   Assessment/Plan: #1. Pneumonia. Continue levofloxacin and vancomycin. Blood cultures pending. #2. Asthma. Continue nebulizer treatments. Oxygen saturation is in the mid 90s on room air now. #3. Diabetes. Continue sliding scale insulin. Principal Problem:   Sepsis Active Problems:   Irritable bowel syndrome   GERD (gastroesophageal reflux disease)   Type 2 diabetes mellitus   Essential hypertension,  benign   IBS (irritable bowel syndrome)   Community acquired pneumonia   Insomnia     LOS: 1 day   Lafe Clerk 04/07/2015, 8:22 AM

## 2015-04-07 NOTE — Care Management Utilization Note (Signed)
UR completed 

## 2015-04-07 NOTE — Progress Notes (Signed)
INITIAL NUTRITION ASSESSMENT  DOCUMENTATION CODES Per approved criteria  -Morbid Obesity   INTERVENTION: - Snacks per pt request  - Recommend OP diabetes referral if pt agreeable, pt does not follow a carb modified diet.   NUTRITION DIAGNOSIS: Inadequate oral intake related to nausea and SOB as evidenced by reportedly eating <50% of usual intake the last 2 days.   Goal: Pt to meet >/= 90% of their estimated nutrition needs   Monitor:  Oral intake, labs-a1c, snack preferences  Reason for Assessment: MST  69 y.o. female  Admitting Dx: Sepsis  ASSESSMENT: 69 y.o. female pmhx significant for GERD, IBS, DM2, HTN.  Presents with SOB Associated with productive cough  Onset of symptoms 2 days ago. Decreased by mouth during this period of time.  Pt reports poor appetite these last 2 days d/t nausea and SOB. She reports still eating at least 1 meal a day though. She does not follow any sort of diet at home and denies taking any vit/minerals.   She was excited to hear her admit weight was 226 lbs. She believes she has lost the weight d/t a lot of stress she has been under recently.   She was interested in snacks saying all of her symptoms have gotten better and she has regained an appetite  Nutrition Focused Physical Exam: Subcutaneous Fat:  Well nourished all areas  Muscle:  Unable to determine all areas  Edema: none noted  Height: Ht Readings from Last 1 Encounters:  04/06/15 4\' 11"  (1.499 m)    Weight: Wt Readings from Last 1 Encounters:  04/07/15 238 lb 1.6 oz (108 kg)    Ideal Body Weight: 98.4 lbs  % Ideal Body Weight: 242%  Wt Readings from Last 10 Encounters:  04/07/15 238 lb 1.6 oz (108 kg)  01/21/15 232 lb (105.235 kg)  10/18/14 233 lb (105.688 kg)  08/25/14 231 lb 9.6 oz (105.053 kg)  03/08/14 236 lb (107.049 kg)  01/25/14 236 lb (107.049 kg)  01/11/14 237 lb 12.8 oz (107.865 kg)  08/27/13 228 lb (103.42 kg)  07/13/13 230 lb 9.6 oz (104.599 kg)   05/25/13 230 lb (104.327 kg)  Admit weight 226 lbs (102.7 kg)  Usual Body Weight: ~230 lbs  % Usual Body Weight: 98%  BMI:  Body mass index is 48.06 kg/(m^2).  45.7 using admit weight  Estimated Nutritional Needs: Kcal: 1400-1500 (13-14 kcal/kg) Protein: 76-90 (1/7-2 g/k IBW) Fluid: 1.4-1.5 liters fluid  Skin: WDL  Diet Order: Diet Carb Modified Fluid consistency:: Thin; Room service appropriate?: Yes  EDUCATION NEEDS: -No education needs identified at this time   Intake/Output Summary (Last 24 hours) at 04/07/15 1451 Last data filed at 04/07/15 0900  Gross per 24 hour  Intake 1115.42 ml  Output    600 ml  Net 515.42 ml    Last BM: Unknown   Labs:   Recent Labs Lab 04/06/15 1830  NA 133*  K 3.5  CL 95*  CO2 27  BUN 10  CREATININE 0.67  CALCIUM 8.8  GLUCOSE 112*    CBG (last 3)   Recent Labs  04/06/15 2214 04/07/15 0730 04/07/15 1132  GLUCAP 119* 73 117*    Scheduled Meds: . amLODipine  5 mg Oral QHS  . aspirin EC  81 mg Oral QHS  . chlorthalidone  25 mg Oral Daily  . dicyclomine  10 mg Oral TID AC & HS  . doxazosin  2 mg Oral Daily  . heparin  5,000 Units Subcutaneous 3 times per  day  . insulin aspart  0-15 Units Subcutaneous TID WC  . insulin aspart  0-5 Units Subcutaneous QHS  . ipratropium-albuterol  3 mL Nebulization Q4H  . levofloxacin (LEVAQUIN) IV  750 mg Intravenous Q24H  . losartan  100 mg Oral Daily  . pantoprazole  40 mg Oral Daily  . potassium chloride  10 mEq Oral Daily  . sodium chloride  3 mL Intravenous Q12H  . vancomycin  750 mg Intravenous Q12H    Continuous Infusions: . sodium chloride 10 mL/hr at 04/07/15 0830    Past Medical History  Diagnosis Date  . Essential hypertension, benign   . Type 2 diabetes mellitus   . Adenomatous colon polyp   . Diverticulitis   . Internal hemorrhoid   . IBS (irritable bowel syndrome)   . GERD (gastroesophageal reflux disease)   . Dyspepsia   . Hiatal hernia   . Tubular  adenoma 11/2010  . Bulging lumbar disc   . PONV (postoperative nausea and vomiting)   . Asthma   . Arthritis   . Right shoulder pain   . Breast cancer [id number]    Past Surgical History  Procedure Laterality Date  . Cholecystectomy    . Tubal ligation    . Esophagogastroduodenoscopy  12/13/2010    Dr. Jennet Maduro hernia, fundal gland type polyps  . Colonoscopy  12/13/2010    Dr. Margarito Courser pancolonic diverticular.tubular adenoma  . Colonoscopy  07/13/2003  . Appendectomy    . Mastectomy  1995  . Back surgery  2007    Lumbar fusion  . Tonsillectomy    . Abdominal hysterectomy    . Cataract extraction w/phaco Left 08/27/2013    Procedure: CATARACT EXTRACTION PHACO AND INTRAOCULAR LENS PLACEMENT (IOC);  Surgeon: Tonny Branch, MD;  Location: [facility];  Service: Ophthalmology;  Laterality: Left;  CDE 4.25  . Eye surgery Left     Left KPE 08/27/13  . Cataract extraction w/phaco Right 09/24/2013    Procedure: CATARACT EXTRACTION PHACO AND INTRAOCULAR LENS PLACEMENT (IOC);  Surgeon: Tonny Branch, MD;  Location: [facility];  Service: Ophthalmology;  Laterality: Right;  CDE:  8.16    Burtis Junes RD, LDN Nutrition Pager: [phone] 04/07/2015 2:51 PM

## 2015-04-08 LAB — LEGIONELLA ANTIGEN, URINE

## 2015-04-08 LAB — GLUCOSE, CAPILLARY
Glucose-Capillary: 177 mg/dL — ABNORMAL HIGH (ref 70–99)
Glucose-Capillary: 210 mg/dL — ABNORMAL HIGH (ref 70–99)

## 2015-04-08 LAB — HEMOGLOBIN A1C
HEMOGLOBIN A1C: 6.5 % — AB (ref 4.8–5.6)
Mean Plasma Glucose: 140 mg/dL

## 2015-04-08 NOTE — Progress Notes (Signed)
Subjective: Ann Fuller complains of continued cough with yellow sputum production. She had some chills last night. Tachycardia has improved with heart rate in the 90s now.  Objective: Vital signs in last 24 hours: Filed Vitals:   04/08/15 0500 04/08/15 0600 04/08/15 0655 04/08/15 0748  BP:   154/62   Pulse:  93 101   Temp:      TempSrc:      Resp:  21 20   Height:      Weight: 238 lb 12.1 oz (108.3 kg)     SpO2: 97% 95% 97% 95%   Weight change: 12 lb 12.1 oz (5.787 kg)  Intake/Output Summary (Last 24 hours) at 04/08/15 0752 Last data filed at 04/08/15 0500  Gross per 24 hour  Intake  837.5 ml  Output    950 ml  Net -112.5 ml    Physical Exam: Alert. No distress. Lungs basilar rales noted. Heart regular with grade 2 systolic murmur. Abdomen is soft and nontender. Extremities reveal no edema.  Lab Results:    Results for orders placed or performed during the hospital encounter of 04/06/15 (from the past 24 hour(s))  Glucose, capillary     Status: Abnormal   Collection Time: 04/07/15 11:32 AM  Result Value Ref Range   Glucose-Capillary 117 (H) 70 - 99 mg/dL   Comment 1 Notify RN    Comment 2 Document in Chart   Glucose, capillary     Status: Abnormal   Collection Time: 04/07/15  4:17 PM  Result Value Ref Range   Glucose-Capillary 171 (H) 70 - 99 mg/dL   Comment 1 Notify RN    Comment 2 Document in Chart   Glucose, capillary     Status: Abnormal   Collection Time: 04/07/15  9:49 PM  Result Value Ref Range   Glucose-Capillary 328 (H) 70 - 99 mg/dL     ABGS  Recent Labs  04/06/15 2230  PHART 7.425  PO2ART 66.3*  TCO2 22.9  HCO3 26.1*   CULTURES Recent Results (from the past 240 hour(s))  Blood Culture (routine x 2)     Status: None (Preliminary result)   Collection Time: 04/06/15  7:10 PM  Result Value Ref Range Status   Specimen Description BLOOD RIGHT ARM DRAWN BY RN  Final   Special Requests BOTTLES DRAWN AEROBIC AND ANAEROBIC 8CC EACH  Final   Culture NO GROWTH 2 DAYS  Final   Report Status PENDING  Incomplete  Blood Culture (routine x 2)     Status: None (Preliminary result)   Collection Time: 04/06/15  7:15 PM  Result Value Ref Range Status   Specimen Description BLOOD RIGHT HAND DRAWN BY RN  Final   Special Requests BOTTLES DRAWN AEROBIC AND ANAEROBIC 6CC EACH  Final   Culture NO GROWTH 2 DAYS  Final   Report Status PENDING  Incomplete  MRSA PCR Screening     Status: None   Collection Time: 04/06/15 11:00 PM  Result Value Ref Range Status   MRSA by PCR NEGATIVE NEGATIVE Final    Comment:        The GeneXpert MRSA Assay (FDA approved for NASAL specimens only), is one component of a comprehensive MRSA colonization surveillance program. It is not intended to diagnose MRSA infection nor to guide or monitor treatment for MRSA infections.   Culture, expectorated sputum-assessment     Status: None   Collection Time: 04/07/15  5:40 AM  Result Value Ref Range Status   Specimen Description SPUTUM EXPECTORATED  Final   Special Requests NONE  Final   Sputum evaluation   Final    THIS SPECIMEN IS ACCEPTABLE. RESPIRATORY CULTURE REPORT TO FOLLOW. Performed at [facility]    Report Status 04/07/2015 FINAL  Final   Studies/Results: Dg Chest Port 1 View  04/06/2015   CLINICAL DATA:  Shortness of breath and cough  EXAM: PORTABLE CHEST - 1 VIEW  COMPARISON:  03/25/2012  FINDINGS: The heart size and mediastinal contours are within normal limits. Pulmonary vascular congestion noted. The visualized skeletal structures are unremarkable.  IMPRESSION: 1. Pulmonary vascular congestion.   Electronically Signed   By: Kerby Moors M.D.   On: 04/06/2015 20:06   Micro Results: Recent Results (from the past 240 hour(s))  Blood Culture (routine x 2)     Status: None (Preliminary result)   Collection Time: 04/06/15  7:10 PM  Result Value Ref Range Status   Specimen Description BLOOD RIGHT ARM DRAWN BY RN  Final   Special Requests  BOTTLES DRAWN AEROBIC AND ANAEROBIC 8CC EACH  Final   Culture NO GROWTH 2 DAYS  Final   Report Status PENDING  Incomplete  Blood Culture (routine x 2)     Status: None (Preliminary result)   Collection Time: 04/06/15  7:15 PM  Result Value Ref Range Status   Specimen Description BLOOD RIGHT HAND DRAWN BY RN  Final   Special Requests BOTTLES DRAWN AEROBIC AND ANAEROBIC 6CC EACH  Final   Culture NO GROWTH 2 DAYS  Final   Report Status PENDING  Incomplete  MRSA PCR Screening     Status: None   Collection Time: 04/06/15 11:00 PM  Result Value Ref Range Status   MRSA by PCR NEGATIVE NEGATIVE Final    Comment:        The GeneXpert MRSA Assay (FDA approved for NASAL specimens only), is one component of a comprehensive MRSA colonization surveillance program. It is not intended to diagnose MRSA infection nor to guide or monitor treatment for MRSA infections.   Culture, expectorated sputum-assessment     Status: None   Collection Time: 04/07/15  5:40 AM  Result Value Ref Range Status   Specimen Description SPUTUM EXPECTORATED  Final   Special Requests NONE  Final   Sputum evaluation   Final    THIS SPECIMEN IS ACCEPTABLE. RESPIRATORY CULTURE REPORT TO FOLLOW. Performed at [facility]    Report Status 04/07/2015 FINAL  Final   Studies/Results: Dg Chest Port 1 View  04/06/2015   CLINICAL DATA:  Shortness of breath and cough  EXAM: PORTABLE CHEST - 1 VIEW  COMPARISON:  03/25/2012  FINDINGS: The heart size and mediastinal contours are within normal limits. Pulmonary vascular congestion noted. The visualized skeletal structures are unremarkable.  IMPRESSION: 1. Pulmonary vascular congestion.   Electronically Signed   By: Kerby Moors M.D.   On: 04/06/2015 20:06   Medications:  I have reviewed the patient's current medications Scheduled Meds: . amLODipine  5 mg Oral QHS  . aspirin EC  81 mg Oral QHS  . chlorthalidone  25 mg Oral Daily  . dicyclomine  10 mg Oral TID AC & HS  .  doxazosin  2 mg Oral Daily  . heparin  5,000 Units Subcutaneous 3 times per day  . insulin aspart  0-15 Units Subcutaneous TID WC  . insulin aspart  0-5 Units Subcutaneous QHS  . ipratropium-albuterol  3 mL Nebulization Q4H  . levofloxacin (LEVAQUIN) IV  750 mg Intravenous Q24H  .  losartan  100 mg Oral Daily  . pantoprazole  40 mg Oral Daily  . potassium chloride  10 mEq Oral Daily  . sodium chloride  3 mL Intravenous Q12H  . vancomycin  750 mg Intravenous Q12H   Continuous Infusions: . sodium chloride 10 mL/hr at 04/07/15 1900   PRN Meds:.acetaminophen **OR** acetaminophen, baclofen, benzonatate, guaiFENesin-dextromethorphan, LORazepam, ondansetron **OR** ondansetron (ZOFRAN) IV, temazepam   Assessment/Plan: #1. Community acquired pneumonia. Continue vancomycin and levofloxacin. Now afebrile. Blood cultures pending. Strep pneumo urinary antigen is negative. Sputum cultures been obtained and is pending. #2. Diabetes. Continue sliding scale insulin.  Transfer to a monitored bed outside step down. Overall condition appears improved. Principal Problem:   Sepsis Active Problems:   Irritable bowel syndrome   GERD (gastroesophageal reflux disease)   Type 2 diabetes mellitus   Essential hypertension, benign   IBS (irritable bowel syndrome)   Community acquired pneumonia   Insomnia     LOS: 2 days   Ann Fuller 04/08/2015, 7:52 AM

## 2015-04-08 NOTE — Progress Notes (Signed)
Patient transferred to room 318. Report given to Betha Loa RN. Vital signs stable at transfer.

## 2015-04-08 NOTE — Progress Notes (Signed)
[place] for Levaquin & Vancomycin  Indication: pneumonia  Allergies  Allergen Reactions  . Ceftin [Cefuroxime Axetil] Swelling  . Codeine Swelling  . Metformin And Related Diarrhea  . Nexium [Esomeprazole Magnesium] Diarrhea  . Omnicef [Cefdinir] Diarrhea  . Sulfa Antibiotics Rash    Patient Measurements: Height: 4\' 11"  (149.9 cm) Weight: 238 lb 12.1 oz (108.3 kg) IBW/kg (Calculated) : 43.2  Vital Signs: Temp: 98.4 F (36.9 C) (04/15 0919) Temp Source: Oral (04/15 0400) BP: 139/59 mmHg (04/15 1038) Pulse Rate: 101 (04/15 0655) Intake/Output from previous day: 04/14 0701 - 04/15 0700 In: 837.5 [P.O.:120; I.V.:417.5; IV Piggyback:300] Out: 950 [Urine:950] Intake/Output from this shift:    Labs:  Recent Labs  04/06/15 1830  WBC 9.2  HGB 11.0*  PLT 210  CREATININE 0.67   Estimated Creatinine Clearance: 75.6 mL/min (by C-G formula based on Cr of 0.67). No results for input(s): VANCOTROUGH, VANCOPEAK, VANCORANDOM, GENTTROUGH, GENTPEAK, GENTRANDOM, TOBRATROUGH, TOBRAPEAK, TOBRARND, AMIKACINPEAK, AMIKACINTROU, AMIKACIN in the last 72 hours.   Microbiology: Recent Results (from the past 720 hour(s))  Blood Culture (routine x 2)     Status: None (Preliminary result)   Collection Time: 04/06/15  7:10 PM  Result Value Ref Range Status   Specimen Description BLOOD RIGHT ARM DRAWN BY RN  Final   Special Requests BOTTLES DRAWN AEROBIC AND ANAEROBIC 8CC EACH  Final   Culture NO GROWTH 2 DAYS  Final   Report Status PENDING  Incomplete  Blood Culture (routine x 2)     Status: None (Preliminary result)   Collection Time: 04/06/15  7:15 PM  Result Value Ref Range Status   Specimen Description BLOOD RIGHT HAND DRAWN BY RN  Final   Special Requests BOTTLES DRAWN AEROBIC AND ANAEROBIC 6CC EACH  Final   Culture NO GROWTH 2 DAYS  Final   Report Status PENDING  Incomplete  MRSA PCR Screening     Status: None   Collection Time: 04/06/15 11:00 PM   Result Value Ref Range Status   MRSA by PCR NEGATIVE NEGATIVE Final    Comment:        The GeneXpert MRSA Assay (FDA approved for NASAL specimens only), is one component of a comprehensive MRSA colonization surveillance program. It is not intended to diagnose MRSA infection nor to guide or monitor treatment for MRSA infections.   Culture, expectorated sputum-assessment     Status: None   Collection Time: 04/07/15  5:40 AM  Result Value Ref Range Status   Specimen Description SPUTUM EXPECTORATED  Final   Special Requests NONE  Final   Sputum evaluation   Final    THIS SPECIMEN IS ACCEPTABLE. RESPIRATORY CULTURE REPORT TO FOLLOW. Performed at [facility]    Report Status 04/07/2015 FINAL  Final    Anti-infectives    Start     Dose/Rate Route Frequency Ordered Stop   04/07/15 1800  levofloxacin (LEVAQUIN) IVPB 750 mg     750 mg 100 mL/hr over 90 Minutes Intravenous Every 24 hours 04/06/15 2115     04/07/15 0900  vancomycin (VANCOCIN) IVPB 750 mg/150 ml premix     750 mg 150 mL/hr over 60 Minutes Intravenous Every 12 hours 04/06/15 2115     04/06/15 2130  vancomycin (VANCOCIN) IVPB 1000 mg/200 mL premix     1,000 mg 200 mL/hr over 60 Minutes Intravenous  Once 04/06/15 2115 04/07/15 0115   04/06/15 1900  levofloxacin (LEVAQUIN) IVPB 750 mg     750 mg  100 mL/hr over 90 Minutes Intravenous  Once 04/06/15 1850 04/06/15 2102   04/06/15 1900  vancomycin (VANCOCIN) IVPB 1000 mg/200 mL premix     1,000 mg 200 mL/hr over 60 Minutes Intravenous  Once 04/06/15 1850 04/06/15 2156      Assessment: 69 yo obese F admitted with shortness of breath & productive cough x 2 days.  She was started on antibiotics for CAP and possible sepsis.  She is febrile on admission (Tm 101.24F).  WBC and lacticacid are wnl.  CXR + PNA.  Cx data pending.    Renal function is at patient's baseline. NCrCl ~ 90 ml/min.  Vancomycin 4/13>> Levaquin 4/13>>  Goal of Therapy:  Vancomycin trough  level 15-20 mcg/ml  Plan:  Levaquin 750mg  IV q24h Change to PO once clinically appropriate Vancomycin 750mg  IV q12h Check Vancomycin trough at steady state Monitor renal function and cx data  Duration of therapy per MD - consider d/c Vanc at this point unless strong clinical suspicion for Ca-MRSA.  Biagio Borg 04/08/2015,12:23 PM

## 2015-04-08 NOTE — Care Management Note (Signed)
    Page 1 of 1   04/08/2015     12:07:02 PM CARE MANAGEMENT NOTE 04/08/2015  Patient:  Ann Fuller, Ann Fuller   Account Number:  [id number]  Date Initiated:  04/07/2015  Documentation initiated by:  Jolene Provost  Subjective/Objective Assessment:   Pt is from home, lives with husband. Pt has cane for PRN use but is independent at baselin. Pt has neb machine from [place] that is old and doesn't work anymore. Pt has no HH services.     Action/Plan:   Will request order for new neb machine Pt would like it from [facility]. Will cont to follow.   Anticipated DC Date:  04/10/2015   Anticipated DC Plan:  HOME/SELF CARE      DC Planning Services  CM consult      PAC Choice  DURABLE MEDICAL EQUIPMENT   Choice offered to / List presented to:  C-1 Patient   DME arranged  NEBULIZER MACHINE      DME agency  [facility]        Status of service:  Completed, signed off Medicare Important Message given?  YES (If response is "NO", the following Medicare IM given date fields will be blank) Date Medicare IM given:  04/08/2015 Medicare IM given by:  Jolene Provost Date Additional Medicare IM given:   Additional Medicare IM given by:    Discharge Disposition:  HOME/SELF CARE  Per UR Regulation:  Reviewed for med. necessity/level of care/duration of stay  If discussed at [place] of Stay Meetings, dates discussed:    Comments:  04/08/2015 [place], RN, MSN, CM Pt plans to discharge over weekend. Order for neb machine faxed to [facility] and spoke with Tammy, pt will need to call [facility] after discharge and time will be made for neb machine to be ordered. No further CM needs.  04/07/2015 [place], RN, MSN, CM

## 2015-04-09 ENCOUNTER — Inpatient Hospital Stay (HOSPITAL_COMMUNITY): Payer: BLUE CROSS/BLUE SHIELD

## 2015-04-09 LAB — URINE CULTURE

## 2015-04-09 LAB — GLUCOSE, CAPILLARY
GLUCOSE-CAPILLARY: 192 mg/dL — AB (ref 70–99)
GLUCOSE-CAPILLARY: 173 mg/dL — AB (ref 70–99)
Glucose-Capillary: 177 mg/dL — ABNORMAL HIGH (ref 70–99)
Glucose-Capillary: 182 mg/dL — ABNORMAL HIGH (ref 70–99)
Glucose-Capillary: 185 mg/dL — ABNORMAL HIGH (ref 70–99)

## 2015-04-09 MED ORDER — METHYLPREDNISOLONE 4 MG PO TBPK
4.0000 mg | ORAL_TABLET | ORAL | Status: AC
  Administered 2015-04-09: 4 mg via ORAL

## 2015-04-09 MED ORDER — METHYLPREDNISOLONE 4 MG PO TBPK
4.0000 mg | ORAL_TABLET | Freq: Three times a day (TID) | ORAL | Status: DC
  Administered 2015-04-10 (×2): 4 mg via ORAL

## 2015-04-09 MED ORDER — METHYLPREDNISOLONE 4 MG PO TBPK
8.0000 mg | ORAL_TABLET | Freq: Every morning | ORAL | Status: AC
  Administered 2015-04-09: 8 mg via ORAL
  Filled 2015-04-09: qty 21

## 2015-04-09 MED ORDER — METHYLPREDNISOLONE 4 MG PO TBPK
8.0000 mg | ORAL_TABLET | Freq: Every evening | ORAL | Status: AC
  Administered 2015-04-09: 8 mg via ORAL

## 2015-04-09 MED ORDER — METHYLPREDNISOLONE 4 MG PO TBPK: 8.0000 mg | ORAL_TABLET | Freq: Every evening | ORAL | Status: DC

## 2015-04-09 MED ORDER — METHYLPREDNISOLONE 4 MG PO TBPK: 4.0000 mg | ORAL_TABLET | ORAL | Status: DC

## 2015-04-09 MED ORDER — METHYLPREDNISOLONE 4 MG PO TBPK: 4.0000 mg | ORAL_TABLET | Freq: Four times a day (QID) | ORAL | Status: DC

## 2015-04-09 NOTE — Progress Notes (Signed)
Patient has increased wheezing and worsening breath sounds per nursing staff and respiratory staff.  Patient still alert and oriented x 4, but patient has become tachypneic, pulse has increased since yesterday, and patient has wheezing without exertion.  Notified md of change in patient status. Orders will be carried out per md.  Will continue to monitor patient.

## 2015-04-09 NOTE — Progress Notes (Signed)
Subjective: The patient had an uncomfortable night. She was coughing intermittently. This is a productive cough she remains on IV antibiotics  Objective: Vital signs in last 24 hours: Temp:  [98.2 F (36.8 C)-99.2 F (37.3 C)] 99.1 F (37.3 C) (04/16 0531) Pulse Rate:  [95-102] 102 (04/16 0531) Resp:  [20] 20 (04/16 0531) BP: (115-139)/(51-65) 115/51 mmHg (04/16 0531) SpO2:  [92 %-98 %] 94 % (04/16 0714) FiO2 (%):  [21 %] 21 % (04/15 2310) Weight change:  Last BM Date: 04/09/15  Intake/Output from previous day: 04/15 0701 - 04/16 0700 In: 540 [P.O.:540] Out: -  Intake/Output this shift:    Physical Exam: Gen. appearance the patient is alert and oriented  HEENT negative  Neck supple no JVD or thyroid abnormalities  Lungs occasional rhonchus heard over lower lung field  Heart regular rhythm no cardiomegaly  Abdomen no palpable organs or masses  Extremities free of edema   Recent Labs  04/06/15 1830  WBC 9.2  HGB 11.0*  HCT 33.0*  PLT 210   BMET  Recent Labs  04/06/15 1830  NA 133*  K 3.5  CL 95*  CO2 27  GLUCOSE 112*  BUN 10  CREATININE 0.67  CALCIUM 8.8    Studies/Results: No results found.  Medications:  . amLODipine  5 mg Oral QHS  . aspirin EC  81 mg Oral QHS  . chlorthalidone  25 mg Oral Daily  . dicyclomine  10 mg Oral TID AC & HS  . doxazosin  2 mg Oral Daily  . heparin  5,000 Units Subcutaneous 3 times per day  . insulin aspart  0-15 Units Subcutaneous TID WC  . insulin aspart  0-5 Units Subcutaneous QHS  . ipratropium-albuterol  3 mL Nebulization Q4H  . levofloxacin (LEVAQUIN) IV  750 mg Intravenous Q24H  . losartan  100 mg Oral Daily  . pantoprazole  40 mg Oral Daily  . potassium chloride  10 mEq Oral Daily  . sodium chloride  3 mL Intravenous Q12H  . vancomycin  750 mg Intravenous Q12H    . sodium chloride 10 mL/hr at 04/07/15 1900     Assessment/Plan: 1. Community-acquired pneumonia-plan to continue IV vancomycin and  levofloxacin  2. Type 2 diabetes-continue diabetic diet continue sliding scale insulin-meds reviewed continue   LOS: 3 days   Ann Fuller G 04/09/2015, 8:32 AM

## 2015-04-10 LAB — CULTURE, RESPIRATORY

## 2015-04-10 LAB — BASIC METABOLIC PANEL
ANION GAP: 12 (ref 5–15)
BUN: 13 mg/dL (ref 6–23)
CHLORIDE: 91 mmol/L — AB (ref 96–112)
CO2: 21 mmol/L (ref 19–32)
CREATININE: 0.86 mg/dL (ref 0.50–1.10)
Calcium: 8.4 mg/dL (ref 8.4–10.5)
GFR, EST AFRICAN AMERICAN: 80 mL/min — AB (ref >90)
GFR, EST NON AFRICAN AMERICAN: 69 mL/min — AB (ref >90)
Glucose, Bld: 263 mg/dL — ABNORMAL HIGH (ref 70–99)
Potassium: 3.8 mmol/L (ref 3.5–5.1)
Sodium: 124 mmol/L — ABNORMAL LOW (ref 135–145)

## 2015-04-10 LAB — GLUCOSE, CAPILLARY
GLUCOSE-CAPILLARY: 255 mg/dL — AB (ref 70–99)
Glucose-Capillary: 246 mg/dL — ABNORMAL HIGH (ref 70–99)

## 2015-04-10 LAB — CULTURE, RESPIRATORY W GRAM STAIN: Culture: NORMAL

## 2015-04-10 LAB — VANCOMYCIN, TROUGH: Vancomycin Tr: 12.9 ug/mL (ref 10.0–20.0)

## 2015-04-10 MED ORDER — IPRATROPIUM-ALBUTEROL 0.5-2.5 (3) MG/3ML IN SOLN: 3.0000 mL | RESPIRATORY_TRACT | Status: AC

## 2015-04-10 MED ORDER — PREDNISONE 20 MG PO TABS: 20.0000 mg | ORAL_TABLET | Freq: Every day | ORAL | Status: DC

## 2015-04-10 MED ORDER — LEVOFLOXACIN 750 MG PO TABS: 750.0000 mg | ORAL_TABLET | Freq: Every day | ORAL | Status: DC

## 2015-04-10 MED ORDER — VANCOMYCIN HCL IN DEXTROSE 1-5 GM/200ML-% IV SOLN
1000.0000 mg | Freq: Two times a day (BID) | INTRAVENOUS | Status: DC
  Filled 2015-04-10 (×2): qty 200

## 2015-04-10 MED ORDER — METHYLPREDNISOLONE 4 MG PO TABS
ORAL_TABLET | ORAL | Status: AC
  Filled 2015-04-10: qty 5

## 2015-04-10 NOTE — Progress Notes (Signed)
Patient received discharge instructions/scripts and had no further questions/concerns.  Patient verbalized understanding of follow-up appointments and medications.  Patient in stable condition at discharge.  Patient escorted to main entrance via wheelchair by nurse tech.

## 2015-04-10 NOTE — Progress Notes (Signed)
[place] for Levaquin & Vancomycin  Indication: pneumonia  Allergies  Allergen Reactions  . Ceftin [Cefuroxime Axetil] Swelling  . Codeine Swelling  . Metformin And Related Diarrhea  . Nexium [Esomeprazole Magnesium] Diarrhea  . Omnicef [Cefdinir] Diarrhea  . Sulfa Antibiotics Rash    Patient Measurements: Height: 4\' 11"  (149.9 cm) Weight: 238 lb 12.1 oz (108.3 kg) IBW/kg (Calculated) : 43.2  Vital Signs: Temp: 97.9 F (36.6 C) (04/17 0748) Temp Source: Oral (04/17 0748) BP: 147/60 mmHg (04/17 0748) Pulse Rate: 109 (04/17 0748) Intake/Output from previous day:   Intake/Output from this shift:    Labs:  Recent Labs  04/10/15 0722  CREATININE 0.86   Estimated Creatinine Clearance: 70.3 mL/min (by C-G formula based on Cr of 0.86).  Recent Labs  04/10/15 0723  VANCOTROUGH 12.9     Microbiology: Recent Results (from the past 720 hour(s))  Blood Culture (routine x 2)     Status: None (Preliminary result)   Collection Time: 04/06/15  7:10 PM  Result Value Ref Range Status   Specimen Description BLOOD RIGHT ARM DRAWN BY RN  Final   Special Requests BOTTLES DRAWN AEROBIC AND ANAEROBIC 8CC EACH  Final   Culture NO GROWTH 4 DAYS  Final   Report Status PENDING  Incomplete  Blood Culture (routine x 2)     Status: None (Preliminary result)   Collection Time: 04/06/15  7:15 PM  Result Value Ref Range Status   Specimen Description BLOOD RIGHT HAND DRAWN BY RN  Final   Special Requests BOTTLES DRAWN AEROBIC AND ANAEROBIC 6CC EACH  Final   Culture NO GROWTH 4 DAYS  Final   Report Status PENDING  Incomplete  Urine culture     Status: None   Collection Time: 04/06/15  7:34 PM  Result Value Ref Range Status   Specimen Description URINE, CLEAN CATCH  Final   Special Requests NONE  Final   Colony Count   Final    75,000 COLONIES/ML Performed at [facility]    Culture   Final    ESCHERICHIA COLI Performed at [facility]    Report Status 04/09/2015 FINAL  Final   Organism ID, Bacteria ESCHERICHIA COLI  Final      Susceptibility   Escherichia coli - MIC*    AMPICILLIN 8 SENSITIVE Sensitive     CEFAZOLIN <=4 SENSITIVE Sensitive     CEFTRIAXONE <=1 SENSITIVE Sensitive     CIPROFLOXACIN >=4 RESISTANT Resistant     GENTAMICIN <=1 SENSITIVE Sensitive     LEVOFLOXACIN >=8 RESISTANT Resistant     NITROFURANTOIN 64 INTERMEDIATE Intermediate     TOBRAMYCIN <=1 SENSITIVE Sensitive     TRIMETH/SULFA <=20 SENSITIVE Sensitive     PIP/TAZO <=4 SENSITIVE Sensitive     * ESCHERICHIA COLI  MRSA PCR Screening     Status: None   Collection Time: 04/06/15 11:00 PM  Result Value Ref Range Status   MRSA by PCR NEGATIVE NEGATIVE Final    Comment:        The GeneXpert MRSA Assay (FDA approved for NASAL specimens only), is one component of a comprehensive MRSA colonization surveillance program. It is not intended to diagnose MRSA infection nor to guide or monitor treatment for MRSA infections.   Culture, expectorated sputum-assessment     Status: None   Collection Time: 04/07/15  5:40 AM  Result Value Ref Range Status   Specimen Description SPUTUM EXPECTORATED  Final   Special Requests NONE  Final   Sputum evaluation   Final    THIS SPECIMEN IS ACCEPTABLE. RESPIRATORY CULTURE REPORT TO FOLLOW. Performed at [facility]    Report Status 04/07/2015 FINAL  Final  Culture, respiratory (NON-Expectorated)     Status: None (Preliminary result)   Collection Time: 04/07/15  5:40 AM  Result Value Ref Range Status   Specimen Description SPUTUM EXPECTORATED  Final   Special Requests NONE  Final   Gram Stain   Final    ABUNDANT WBC PRESENT, PREDOMINANTLY PMN FEW SQUAMOUS EPITHELIAL CELLS PRESENT FEW GRAM POSITIVE COCCI IN PAIRS Performed at [facility]    Culture   Final    Culture reincubated for better growth Performed at [facility]    Report Status PENDING  Incomplete     Anti-infectives    Start     Dose/Rate Route Frequency Ordered Stop   04/07/15 1800  levofloxacin (LEVAQUIN) IVPB 750 mg     750 mg 100 mL/hr over 90 Minutes Intravenous Every 24 hours 04/06/15 2115     04/07/15 0900  vancomycin (VANCOCIN) IVPB 750 mg/150 ml premix     750 mg 150 mL/hr over 60 Minutes Intravenous Every 12 hours 04/06/15 2115     04/06/15 2130  vancomycin (VANCOCIN) IVPB 1000 mg/200 mL premix     1,000 mg 200 mL/hr over 60 Minutes Intravenous  Once 04/06/15 2115 04/07/15 0115   04/06/15 1900  levofloxacin (LEVAQUIN) IVPB 750 mg     750 mg 100 mL/hr over 90 Minutes Intravenous  Once 04/06/15 1850 04/06/15 2102   04/06/15 1900  vancomycin (VANCOCIN) IVPB 1000 mg/200 mL premix     1,000 mg 200 mL/hr over 60 Minutes Intravenous  Once 04/06/15 1850 04/06/15 2156      Assessment: 69 yo obese F admitted with shortness of breath & productive cough x 2 days.  She was started on antibiotics for CAP and possible sepsis.  She is febrile on admission (Tm 101.29F).  WBC and lacticacid are wnl.  CXR + PNA.  Cx data pending.    Renal function is at patient's baseline. NCrCl ~ 90 ml/min.  Vancomycin 4/13>> Levaquin 4/13>>  Vancomycin trough below goal this AM Increased wheezing, worsening breath sounds last evening.  Goal of Therapy:  Vancomycin trough level 15-20 mcg/ml  Plan:  Levaquin 750mg  IV q24h Change to PO once clinically appropriate, continue IV presently due to patient condition Increase Vancomycin to 1 GM IV q12h Check Vancomycin trough at steady state Monitor renal function and cx data  Duration of therapy per MD   Abner Greenspan, Yuritza Paulhus Bennett 04/10/2015,9:05 AM

## 2015-04-11 LAB — CULTURE, BLOOD (ROUTINE X 2)
CULTURE: NO GROWTH
Culture: NO GROWTH

## 2015-04-12 NOTE — Discharge Summary (Signed)
Physician Discharge Summary  KEERTHANA VANROSSUM WNI:[id number] DOB: 1946/05/31 DOA: 04/06/2015   Admit date: 04/06/2015 Discharge date: 04/12/2015  Discharge Diagnoses:  Principal Problem:   Sepsis Active Problems:   Irritable bowel syndrome   GERD (gastroesophageal reflux disease)   Type 2 diabetes mellitus   Essential hypertension, benign   IBS (irritable bowel syndrome)   Community acquired pneumonia   Insomnia    Wt Readings from Last 3 Encounters:  04/08/15 238 lb 12.1 oz (108.3 kg)  01/21/15 232 lb (105.235 kg)  10/18/14 233 lb (105.688 kg)     Hospital Course:  Mrs. Loflin was admitted with cough and fever. She was felt to have sepsis and community acquired pneumonia. She was treated with intravenous antibiotic therapy with vancomycin and levofloxacin. She responded well to treatment. Her fever resolved. Her respiratory status improved. She was treated with corticosteroids and inhaled bronchodilators. Blood cultures have remained negative. She was initially on a step down setting and was then transferred to the floor. She gradually improved and was felt to be stable for discharge on the morning of April 17. She will continue oral antibiotic therapy with levofloxacin for another week. She will be seen in follow-up in my office in 5 days. Insulin doses will be at half-strength over the next few days until she returns to her usual eating habits. Exam on the day of discharge revealed no wheezes. Heart rate had improved into the 90s.  Condition at discharge is much improved. She will continue nebulizer treatments at home every 4-6 hours as needed. She will continue corticosteroids with prednisone which will be tapered.   Discharge Instructions     Medication List    STOP taking these medications        HYDROcodone-acetaminophen 5-325 MG per tablet  Commonly known as:  NORCO/VICODIN     HYDROcodone-homatropine 5-1.5 MG/5ML syrup  Commonly known as:  HYCODAN      TAKE  these medications        amLODipine 5 MG tablet  Commonly known as:  NORVASC  Take 5 mg by mouth at bedtime.     aspirin 81 MG tablet  Take 81 mg by mouth at bedtime.     baclofen 10 MG tablet  Commonly known as:  LIORESAL  Take 5-10 mg by mouth 3 (three) times daily as needed for muscle spasms.     benzonatate 200 MG capsule  Commonly known as:  TESSALON  Take 200 mg by mouth every 4 (four) hours as needed for cough.     BYETTA 10 MCG PEN 10 MCG/0.04ML Sopn injection  Generic drug:  exenatide  Inject 10 mcg into the skin 2 (two) times daily with a meal.     chlorthalidone 25 MG tablet  Commonly known as:  HYGROTON  Take 25 mg by mouth daily.     dexlansoprazole 60 MG capsule  Commonly known as:  DEXILANT  Take 60 mg by mouth daily.     dicyclomine 10 MG capsule  Commonly known as:  BENTYL  TAKE ONE CAPSULE BY MOUTH PRIOR TO MEALS AS NEEDED NO MORE THAN FOUR TIMES DAILY. MAY CAUSE DROWSINESS     doxazosin 2 MG tablet  Commonly known as:  CARDURA  Take 2 mg by mouth daily.     Insulin Glargine 100 UNIT/ML Solostar Pen  Commonly known as:  LANTUS  Inject 50 Units into the skin every morning.     ipratropium-albuterol 0.5-2.5 (3) MG/3ML Soln  Commonly known as:  DUONEB  Take 3 mLs by nebulization every 4 (four) hours.     levofloxacin 750 MG tablet  Commonly known as:  LEVAQUIN  Take 1 tablet (750 mg total) by mouth daily.     LIPITOR 40 MG tablet  Generic drug:  atorvastatin  Take 40 mg by mouth daily.     LORazepam 1 MG tablet  Commonly known as:  ATIVAN  Take 1 mg by mouth 2 (two) times daily as needed. Anxiety/Sleep     losartan 100 MG tablet  Commonly known as:  COZAAR  Take 100 mg by mouth daily.     NOVOLOG FLEXPEN [place]  Inject 10 Units into the skin 2 (two) times daily. 10 units twice daily before meals     potassium chloride 10 MEQ tablet  Commonly known as:  K-DUR  Take 10 mEq by mouth daily.     predniSONE 20 MG tablet  Commonly known as:   DELTASONE  Take 1 tablet (20 mg total) by mouth daily with breakfast.     SYMBICORT 160-4.5 MCG/ACT inhaler  Generic drug:  budesonide-formoterol  USE 2 PUFFS 2 TIMES A DAY     temazepam 15 MG capsule  Commonly known as:  RESTORIL  Take 15 mg by mouth at bedtime as needed. Sleep     VENTOLIN HFA 108 (90 BASE) MCG/ACT inhaler  Generic drug:  albuterol  Inhale 2 puffs into the lungs every 6 (six) hours as needed. Shortness of Breath         Ivee Poellnitz 04/12/2015

## 2015-04-12 NOTE — Care Management Utilization Note (Signed)
UR completed 

## 2015-04-19 ENCOUNTER — Ambulatory Visit (INDEPENDENT_AMBULATORY_CARE_PROVIDER_SITE_OTHER)
Admission: RE | Admit: 2015-04-19 | Discharge: 2015-04-19 | Disposition: A | Discharge status: Home / Self Care | Payer: BLUE CROSS/BLUE SHIELD | Source: Ambulatory Visit | Attending: Adult Health | Admitting: Adult Health

## 2015-04-19 ENCOUNTER — Encounter: Payer: Self-pay | Admitting: Adult Health

## 2015-04-19 ENCOUNTER — Ambulatory Visit (INDEPENDENT_AMBULATORY_CARE_PROVIDER_SITE_OTHER): Payer: BLUE CROSS/BLUE SHIELD | Admitting: Adult Health

## 2015-04-19 VITALS — BP 110/66 | HR 89 | Temp 98.1°F | Ht 59.0 in | Wt 223.0 lb

## 2015-04-19 DIAGNOSIS — J189 Pneumonia, unspecified organism: Secondary | ICD-10-CM

## 2015-04-19 DIAGNOSIS — J4531 Mild persistent asthma with (acute) exacerbation: Secondary | ICD-10-CM | POA: Diagnosis not present

## 2015-04-19 MED ORDER — PREDNISONE 10 MG PO TABS: ORAL_TABLET | ORAL | Status: DC

## 2015-04-19 NOTE — Progress Notes (Signed)
Subjective:    Patient ID: Ann Fuller, female    DOB: 07/27/1946, 69 y.o.   MRN: [id number]    Brief patient profile:  69 yo female never smoker with asthma and tendency to cyclical cough     PUL ASTHMA HISTORY 01/11/2014 07/14/2013 05/25/2013 11/12/2012  Symptoms Daily 0-2 days/week Daily 0-2 days/week  Nighttime awakenings 0-2/month 0-2/month Often--7/wk 0-2/month  Interference with activity Some limitations No limitations Minor limitations No limitations  SABA use 0-2 days/wk 0-2 days/wk Several times/day 0-2 days/wk  Exacerbations requiring oral steroids 0-1 / year 0-1 / year 0-1 / year 0-1 / year   01/11/2014 Chief Complaint  Patient presents with  . Follow-up    c/o wheezing and prod cough with yellow mucus x 2 wks.  No increased SOB, chest tightness/pain, or f/c/s.  Notes wheezing and cough for two weeks.  Zpak rx before holidays, then cough came back.  Now is yellow mucus.  Notes more wheezing , notes more dyspnea with exertion. No sinus issues rec Stay on dulera two puff twice daily A azithromycin RX sent to pharmacy    01/21/2015 acute  ov/Wert re: asthma flare Chief Complaint  Patient presents with  . Acute Visit    Pt c/o increased SOB and cough for the past 2 wks. She started having soreness in her chest last night. Her cough is prod with minimal yellow sputum.  She is using rescue inhaler 2 x per day on average.   flared about the time she ran out of her dulera/symbicort (uses interchangeably) > sob x across the room when flares, some better with saba but hfa very poor. Coughs so hard hurts in ant chest diffusely during coughing fits coiugh Better with tessalon but not completely reieved    >depo Inj    04/19/2015 [facility] follow up  Pt presents for a post hospital follow up .  Admitted 4/13 to 4/19 for CAP, asthma flare  and sepsis.  She was treated with IV abx, steroids and nebs.  BC were neg.  Discharged on levaquin and steroid taper.  She says  she still feels bad with nasal congestion, productive cough with thick white mucus.  Wheezing is starting back.  No fever, hemoptysis, n/v/d, orthopnea, edema . Cough is keeping her up at night despite tessalon, robitussin, and hydromet.    Current Medications, Allergies, Complete Past Medical History, Past Surgical History, Family History, and Social History were reviewed in [facility] record.  ROS  The following are not active complaints unless bolded sore throat, dysphagia, dental problems, itching, sneezing,  nasal congestion or excess/ purulent secretions, ear ache,   fever, chills, sweats, unintended wt loss, pleuritic or exertional cp, hemoptysis,  orthopnea pnd or leg swelling, presyncope, palpitations, heartburn, abdominal pain, anorexia, nausea, vomiting, diarrhea  or change in bowel or urinary habits, change in stools or urine, dysuria,hematuria,  rash, arthralgias, visual complaints, headache, numbness weakness or ataxia or problems with walking or coordination,  change in mood/affect or memory.           Objective:   Physical Exam Vital signs reviewed      GEN: A/Ox3; pleasant , NAD, obese   HEENT:  [place]/AT,  EACs-clear, TMs-wnl, NOSE-clear drainage , THROAT-clear, no lesions, no postnasal drip or exudate noted.   NECK:  Supple w/ fair ROM; no JVD; normal carotid impulses w/o bruits; no thyromegaly or nodules palpated; no lymphadenopathy.  RESP  Barking cough , faint exp wheeze on forced exp, speaks  in full sentences, no accessory use.   CARD:  RRR, no m/r/g  , tr  peripheral edema, pulses intact, no cyanosis or clubbing.  GI:   Soft & nt; nml bowel sounds; no organomegaly or masses detected.  Musco: Warm bil, no deformities or joint swelling noted.   Neuro: alert, no focal deficits noted.    Skin: Warm, no lesions or rashes        Assessment & Plan:

## 2015-04-19 NOTE — Patient Instructions (Addendum)
Add Delsym 2 tsp Twice daily  For cough.  Tessalon Three times a day  For cough  Prednisone taper over next week.  Call if blood sugar if >250  We are setting you up for a CT sinus  Chest xray today .  Add Pepcid 20mg  At bedtime  .  follow up Dr. Joya Gaskins  2-3 weeks and As needed   Please contact office for sooner follow up if symptoms do not improve or worsen or seek emergency care

## 2015-04-20 NOTE — Assessment & Plan Note (Signed)
Slow to resolve with lingering cough  Check cxr today  Hold on additional abx for now   Plan  Add Delsym 2 tsp Twice daily  For cough.  Tessalon Three times a day  For cough  Prednisone taper over next week.  Call if blood sugar if >250  We are setting you up for a CT sinus  Chest xray today .  Add Pepcid 20mg  At bedtime  .  follow up Dr. Joya Gaskins  2-3 weeks and As needed   Please contact office for sooner follow up if symptoms do not improve or worsen or seek emergency care

## 2015-04-20 NOTE — Assessment & Plan Note (Signed)
Slow to resolve exacerbation with PNA  Check cxr today  Check CT sinus to r/o sinux dz   Plan  Add Delsym 2 tsp Twice daily  For cough.  Tessalon Three times a day  For cough  Prednisone taper over next week.  Call if blood sugar if >250  We are setting you up for a CT sinus  Chest xray today .  Add Pepcid 20mg  At bedtime  .  follow up Dr. Joya Gaskins  2-3 weeks and As needed   Please contact office for sooner follow up if symptoms do not improve or worsen or seek emergency care

## 2015-04-22 ENCOUNTER — Telehealth: Payer: Self-pay | Admitting: Critical Care Medicine

## 2015-04-22 ENCOUNTER — Ambulatory Visit (INDEPENDENT_AMBULATORY_CARE_PROVIDER_SITE_OTHER)
Admission: RE | Admit: 2015-04-22 | Discharge: 2015-04-22 | Disposition: A | Discharge status: Home / Self Care | Payer: BLUE CROSS/BLUE SHIELD | Source: Ambulatory Visit | Attending: Adult Health | Admitting: Adult Health

## 2015-04-22 DIAGNOSIS — J4531 Mild persistent asthma with (acute) exacerbation: Secondary | ICD-10-CM | POA: Diagnosis not present

## 2015-04-22 MED ORDER — DOXYCYCLINE HYCLATE 100 MG PO TABS: 100.0000 mg | ORAL_TABLET | Freq: Two times a day (BID) | ORAL | Status: DC

## 2015-04-22 NOTE — Telephone Encounter (Signed)
Rx sent to pharmacy.  Patient notified via voicemail that Rx has been sent to pharmacy.  Nothing further needed.

## 2015-04-22 NOTE — Telephone Encounter (Signed)
Patient calling regarding her CXR results.  She spoke with Jess earlier and Jess told her that she may need to be put on antibiotics if she is not getting any better over the weekend.  Patient says she would like to have antibiotics called in for her so she can have it during the weekend.

## 2015-04-22 NOTE — Telephone Encounter (Signed)
Her CT sinus is neg  Doxycycline 100mg  Twice daily  #14 , if not improving will need ov with MD  Needs cxr on return that should have already been set up .  Eat yogurt. , take abx with food, use sunscreen while on abx.  Please contact office for sooner follow up if symptoms do not improve or worsen or seek emergency care

## 2015-04-22 NOTE — Progress Notes (Signed)
Quick Note:  Called spoke with patient, advised of cxr results / recs as stated by TP. Pt verbalized her understanding and denied any questions. Pt reports she does feel better with the pred pack, and the cough is "nothing like it was before" though she does mention some hoarseness. Pt to keep appt for CT Sinus today and will keep the 6/2 appt with PW. Pt is aware to contact the office if her symptoms do not continue to improve or they worsen. Will inform TP. ______

## 2015-04-26 ENCOUNTER — Telehealth: Payer: Self-pay | Admitting: Critical Care Medicine

## 2015-04-26 NOTE — Telephone Encounter (Signed)
No sign of sinus infection      Cont w/ ov recs    Please contact office for sooner follow up if symptoms do not improve or worsen or seek emergency care     Pt aware of results.  Nothing further needed.

## 2015-05-10 ENCOUNTER — Ambulatory Visit (INDEPENDENT_AMBULATORY_CARE_PROVIDER_SITE_OTHER): Payer: BLUE CROSS/BLUE SHIELD | Admitting: Adult Health

## 2015-05-10 ENCOUNTER — Other Ambulatory Visit (INDEPENDENT_AMBULATORY_CARE_PROVIDER_SITE_OTHER): Payer: BLUE CROSS/BLUE SHIELD

## 2015-05-10 ENCOUNTER — Encounter: Payer: Self-pay | Admitting: Adult Health

## 2015-05-10 ENCOUNTER — Ambulatory Visit (INDEPENDENT_AMBULATORY_CARE_PROVIDER_SITE_OTHER)
Admission: RE | Admit: 2015-05-10 | Discharge: 2015-05-10 | Disposition: A | Discharge status: Home / Self Care | Payer: BLUE CROSS/BLUE SHIELD | Source: Ambulatory Visit | Attending: Adult Health | Admitting: Adult Health

## 2015-05-10 VITALS — BP 132/62 | HR 82 | Temp 98.0°F | Ht 59.0 in | Wt 232.6 lb

## 2015-05-10 DIAGNOSIS — I5189 Other ill-defined heart diseases: Secondary | ICD-10-CM

## 2015-05-10 DIAGNOSIS — I519 Heart disease, unspecified: Secondary | ICD-10-CM | POA: Diagnosis not present

## 2015-05-10 DIAGNOSIS — J189 Pneumonia, unspecified organism: Secondary | ICD-10-CM

## 2015-05-10 DIAGNOSIS — J4531 Mild persistent asthma with (acute) exacerbation: Secondary | ICD-10-CM

## 2015-05-10 DIAGNOSIS — R0600 Dyspnea, unspecified: Secondary | ICD-10-CM | POA: Diagnosis not present

## 2015-05-10 LAB — BASIC METABOLIC PANEL
BUN: 13 mg/dL (ref 6–23)
CO2: 32 mEq/L (ref 19–32)
Calcium: 9.7 mg/dL (ref 8.4–10.5)
Chloride: 97 mEq/L (ref 96–112)
Creatinine, Ser: 0.95 mg/dL (ref 0.40–1.20)
GFR: 75.48 mL/min (ref >60.00)
Glucose, Bld: 150 mg/dL — ABNORMAL HIGH (ref 70–99)
POTASSIUM: 3.3 meq/L — AB (ref 3.5–5.1)
Sodium: 136 mEq/L (ref 135–145)

## 2015-05-10 LAB — SEDIMENTATION RATE: Sed Rate: 24 mm/hr — ABNORMAL HIGH (ref 0–22)

## 2015-05-10 LAB — BRAIN NATRIURETIC PEPTIDE: Pro B Natriuretic peptide (BNP): 20 pg/mL (ref 0.0–100.0)

## 2015-05-10 MED ORDER — FUROSEMIDE 20 MG PO TABS: 40.0000 mg | ORAL_TABLET | Freq: Every day | ORAL | Status: DC

## 2015-05-10 MED ORDER — LEVOFLOXACIN 750 MG PO TABS: 750.0000 mg | ORAL_TABLET | Freq: Every day | ORAL | Status: AC

## 2015-05-10 NOTE — Assessment & Plan Note (Signed)
Slow to resolve flare with underlying PNA.   Recent steroid  X  2 -hold on additional steroids  CT sinus neg  Needs to restart maintenance   Plan  Restart Symbicort 2 puffs Twice daily  , rinse after use  Eat yogurt , start Probiotic (Align ) daily  Mucinex Twice daily  As needed  Cough/congestion  Delsym As needed  Cough .  Follow up Dr. Joya Gaskins  In 2 weeks with chest xray .  Please contact office for sooner follow up if symptoms do not improve or worsen or seek emergency care

## 2015-05-10 NOTE — Assessment & Plan Note (Signed)
Residual LLL infiltrate ? cxr lag vs residual infection +/- edema  Check ESR ? BOOP  Will treat with additonal abx with Levaquin if not improving will need to consider CT chest  Advised on abx use with pt education , rec probiotic   Plan   Begin Levaquin $RemoveBefore'750mg'ZAlHCrhjqhnju$  daily for 5 days .-take with food.  Eat yogurt , start Probiotic (Align ) daily  Mucinex Twice daily  As needed  Cough/congestion  Delsym As needed  Cough .  Follow up Dr. Joya Gaskins  In 2 weeks with chest xray .  Please contact office for sooner follow up if symptoms do not improve or worsen or seek emergency care

## 2015-05-10 NOTE — Assessment & Plan Note (Signed)
Suspect decompensated in setting of PNA , frequent steroid use  Check bnp  Echo from last year reviewed   Plan  hold Chlorthiadone.  Lasix 40mg  daily for 3 days then restart chlorthiadone.  Follow up Dr. Joya Gaskins  In 2 weeks with chest xray .  Please contact office for sooner follow up if symptoms do not improve or worsen or seek emergency care

## 2015-05-10 NOTE — Progress Notes (Signed)
Subjective:    Patient ID: Ann Fuller, female    DOB: [birth year], 69 y.o.   MRN: [id number]    Brief patient profile:  69 yo female never smoker with asthma and tendency to cyclical cough     PUL ASTHMA HISTORY 01/11/2014 07/14/2013 05/25/2013 11/12/2012  Symptoms Daily 0-2 days/week Daily 0-2 days/week  Nighttime awakenings 0-2/month 0-2/month Often--7/wk 0-2/month  Interference with activity Some limitations No limitations Minor limitations No limitations  SABA use 0-2 days/wk 0-2 days/wk Several times/day 0-2 days/wk  Exacerbations requiring oral steroids 0-1 / year 0-1 / year 0-1 / year 0-1 / year   01/11/2014 Chief Complaint  Patient presents with  . Follow-up    c/o wheezing and prod cough with yellow mucus x 2 wks.  No increased SOB, chest tightness/pain, or f/c/s.  Notes wheezing and cough for two weeks.  Zpak rx before holidays, then cough came back.  Now is yellow mucus.  Notes more wheezing , notes more dyspnea with exertion. No sinus issues rec Stay on dulera two puff twice daily A azithromycin RX sent to pharmacy    01/21/2015 acute  ov/[facility] re: asthma flare Chief Complaint  Patient presents with  . Acute Visit    Pt c/o increased SOB and cough for the past 2 wks. She started having soreness in her chest last night. Her cough is prod with minimal yellow sputum.  She is using rescue inhaler 2 x per day on average.   flared about the time she ran out of her dulera/symbicort (uses interchangeably) > sob x across the room when flares, some better with saba but hfa very poor. Coughs so hard hurts in ant chest diffusely during coughing fits coiugh Better with tessalon but not completely reieved    >depo Inj    04/19/2015 [facility] follow up  Pt presents for a post hospital follow up .  Admitted 4/13 to 4/19 for CAP, asthma flare  and sepsis.  She was treated with IV abx, steroids and nebs.  BC were neg.  Discharged on levaquin and steroid taper.  She says  she still feels bad with nasal congestion, productive cough with thick white mucus.  Wheezing is starting back.  No fever, hemoptysis, n/v/d, orthopnea, edema . Cough is keeping her up at night despite tessalon, robitussin, and hydromet.  >>pred taper   05/10/2015 Follow up PNA  Pt returns for 3 week follow up of slow to resolve asthma and PNA flare Recent admission for PNA/asthma/sepsis 4/19 CXR last ov showed persistent LLL infiltrate CT sinus was neg.   She was treated with steroid taper and Doxycyline .  Says she still has persistent cough .  Complains of o prod cough (yellow), pain or soreness in left side under arm, sob, wheezing , chest tightness with lying down. Denies fever, hemoptysis , chest pain, orthopnea, or n/v/d.  CXR today shows persistent LLL infiltrate .  She has noticed more ankle swelling. On HCTZ daily  Has not been taking her symbicort. -ran out.  Previous echo 2015 with mod LVH, nml EF, GR 1 DD , mild aortic stenosis .   Current Medications, Allergies, Complete Past Medical History, Past Surgical History, Family History, and Social History were reviewed in [employer] record.  ROS  The following are not active complaints unless bolded sore throat, dysphagia, dental problems, itching, sneezing,  nasal congestion or excess/ purulent secretions, ear ache,   fever, chills, sweats, unintended wt loss, pleuritic or exertional cp, hemoptysis,  orthopnea pnd or  presyncope, palpitations, heartburn, abdominal pain, anorexia, nausea, vomiting, diarrhea  or change in bowel or urinary habits, change in stools or urine, dysuria,hematuria,  rash, arthralgias, visual complaints, headache, numbness weakness or ataxia or problems with walking or coordination,  change in mood/affect or memory.           Objective:   Physical Exam Vital signs reviewed      GEN: A/Ox3; pleasant , NAD, morbdily obese   HEENT:  [place]/AT,  EACs-clear, TMs-wnl, NOSE-clear  drainage , THROAT-clear, no lesions, no postnasal drip or exudate noted.   NECK:  Supple w/ fair ROM; no JVD; normal carotid impulses w/o bruits; no thyromegaly or nodules palpated; no lymphadenopathy.  RESP  Barking cough , no wheezing  speaks in full sentences, no accessory use.   CARD:  RRR, no m/r/g  , tr  peripheral edema, pulses intact, no cyanosis or clubbing.  GI:   Soft & nt; nml bowel sounds; no organomegaly or masses detected.  Musco: Warm bil, no deformities or joint swelling noted.   Neuro: alert, no focal deficits noted.    Skin: Warm, no lesions or rashes        Assessment & Plan:

## 2015-05-10 NOTE — Patient Instructions (Addendum)
Hold Chlorthiadone.  Lasix 40mg  daily for 3 days then restart chlorthiadone.  Restart Symbicort 2 puffs Twice daily  , rinse after use  Begin Levaquin 750mg  daily for 5 days .-take with food.  Eat yogurt , start Probiotic (Align ) daily  Mucinex Twice daily  As needed  Cough/congestion  Delsym As needed  Cough .  Follow up Dr. Joya Gaskins  In 2 weeks with chest xray .  Please contact office for sooner follow up if symptoms do not improve or worsen or seek emergency care

## 2015-05-25 ENCOUNTER — Encounter: Payer: Self-pay | Admitting: Critical Care Medicine

## 2015-05-25 ENCOUNTER — Ambulatory Visit (INDEPENDENT_AMBULATORY_CARE_PROVIDER_SITE_OTHER): Payer: BLUE CROSS/BLUE SHIELD | Admitting: Critical Care Medicine

## 2015-05-25 VITALS — BP 130/60 | HR 111 | Ht 59.0 in | Wt 233.0 lb

## 2015-05-25 DIAGNOSIS — J189 Pneumonia, unspecified organism: Secondary | ICD-10-CM

## 2015-05-25 DIAGNOSIS — J4540 Moderate persistent asthma, uncomplicated: Secondary | ICD-10-CM | POA: Diagnosis not present

## 2015-05-25 DIAGNOSIS — J690 Pneumonitis due to inhalation of food and vomit: Secondary | ICD-10-CM | POA: Diagnosis not present

## 2015-05-25 NOTE — Patient Instructions (Signed)
No change in medications CT scan will be done, will call with results F/U - 1 month

## 2015-05-25 NOTE — Progress Notes (Signed)
Subjective:    Patient ID: Ann Fuller, female    DOB: Oct 17, 1946, 69 y.o.   MRN: [id number]  HPI 05/25/2015 Chief Complaint  Patient presents with  . Follow-up    SOB w/activity; cough some; used neb last couple night; chest tightness when lying down   Prior hx LLL PNA sepsis 03/2015 slow to resolve. Not seen by PW since 12/2013 for RAD and cough on dulera.  Has had prolonged levaquin/doxy/pred and persistent LLL infiltrate on cxr 05/10/15. Pt is still dyspneic with any activity, still cough but is better, no real mucus, no fever. No real chest pain, notes some wheezing.    Pt denies any significant sore throat, nasal congestion or excess secretions, fever, chills, sweats, unintended weight loss, pleurtic or exertional chest pain, orthopnea PND, or leg swelling  PUL ASTHMA HISTORY 05/25/2015 01/11/2014 07/14/2013 05/25/2013 11/12/2012  Symptoms Daily Daily 0-2 days/week Daily 0-2 days/week  Nighttime awakenings 0-2/month 0-2/month 0-2/month Often--7/wk 0-2/month  Interference with activity Some limitations Some limitations No limitations Minor limitations No limitations  SABA use Daily 0-2 days/wk 0-2 days/wk Several times/day 0-2 days/wk  Exacerbations requiring oral steroids 2 or more / year 0-1 / year 0-1 / year 0-1 / year 0-1 / year    Current Medications, Allergies, Complete Past Medical History, Past Surgical History, Family History, and Social History were reviewed in [place] record per todays encounter:  05/25/2015 Review of Systems  Constitutional: Negative.   HENT: Negative.  Negative for ear pain, postnasal drip, rhinorrhea, sinus pressure, sore throat, trouble swallowing and voice change.   Eyes: Negative.   Respiratory: Positive for cough, shortness of breath and wheezing. Negative for apnea, choking, chest tightness and stridor.   Cardiovascular: Negative.  Negative for chest pain, palpitations and leg swelling.  Gastrointestinal: Negative.   Negative for nausea, vomiting, abdominal pain and abdominal distention.  Genitourinary: Negative.   Musculoskeletal: Negative.  Negative for myalgias and arthralgias.  Skin: Negative.  Negative for rash.  Allergic/Immunologic: Negative.  Negative for environmental allergies and food allergies.  Neurological: Negative.  Negative for dizziness, syncope, weakness and headaches.  Hematological: Negative.  Negative for adenopathy. Does not bruise/bleed easily.  Psychiatric/Behavioral: Negative.  Negative for sleep disturbance and agitation. The patient is not nervous/anxious.        Objective:   Physical Exam Filed Vitals:   05/25/15 1134  BP: 130/60  Pulse: 111  Height: 4\' 11"  (1.499 m)  Weight: 233 lb (105.688 kg)  SpO2: 93%    Gen: Pleasant, well-nourished, in no distress,  normal affect  ENT: No lesions,  mouth clear,  oropharynx clear, no postnasal drip  Neck: No JVD, no TMG, no carotid bruits  Lungs: No use of accessory muscles, no dullness to percussion, consolidation changes RLL  Cardiovascular: RRR, heart sounds normal, no murmur or gallops, no peripheral edema  Abdomen: soft and NT, no HSM,  BS normal  Musculoskeletal: No deformities, no cyanosis or clubbing  Neuro: alert, non focal  Skin: Warm, no lesions or rashes  No results found.        Assessment & Plan:  I personally reviewed all images and lab data in the [facility] system as well as any outside material available during this office visit and agree with the  radiology impressions.   Community acquired pneumonia Persistent RLL infiltrate ?boop Prior cap Plan Ct chest  May need steroids   Moderate persistent asthma in adult without complication Mod persis asthma not in exacerbation Cont inhaled  meds    Betheny was seen today for follow-up.  Diagnoses and all orders for this visit:  Aspiration pneumonia, unspecified aspiration pneumonia type Orders: -     CT Chest Wo Contrast;  Future  Community acquired pneumonia  Moderate persistent asthma in adult without complication    I had an extended discussion with the patient and or family lasting 10 minutes of a 25 minute visit including:  Dx, tx options, need for CT chest

## 2015-05-26 ENCOUNTER — Ambulatory Visit: Payer: BLUE CROSS/BLUE SHIELD | Admitting: Critical Care Medicine

## 2015-05-27 NOTE — Assessment & Plan Note (Signed)
Mod persis asthma not in exacerbation Cont inhaled meds

## 2015-05-27 NOTE — Assessment & Plan Note (Signed)
Persistent RLL infiltrate ?boop Prior cap Plan Ct chest  May need steroids

## 2015-05-30 ENCOUNTER — Ambulatory Visit (HOSPITAL_COMMUNITY)
Admission: RE | Admit: 2015-05-30 | Discharge: 2015-05-30 | Disposition: A | Discharge status: Home / Self Care | Payer: BLUE CROSS/BLUE SHIELD | Source: Ambulatory Visit | Attending: Critical Care Medicine | Admitting: Critical Care Medicine

## 2015-05-30 DIAGNOSIS — J690 Pneumonitis due to inhalation of food and vomit: Secondary | ICD-10-CM | POA: Diagnosis not present

## 2015-05-30 DIAGNOSIS — Z09 Encounter for follow-up examination after completed treatment for conditions other than malignant neoplasm: Secondary | ICD-10-CM | POA: Diagnosis not present

## 2015-05-30 DIAGNOSIS — Z853 Personal history of malignant neoplasm of breast: Secondary | ICD-10-CM | POA: Diagnosis not present

## 2015-05-30 DIAGNOSIS — Z923 Personal history of irradiation: Secondary | ICD-10-CM | POA: Insufficient documentation

## 2015-05-30 DIAGNOSIS — Z9221 Personal history of antineoplastic chemotherapy: Secondary | ICD-10-CM | POA: Diagnosis not present

## 2015-05-30 DIAGNOSIS — I2510 Atherosclerotic heart disease of native coronary artery without angina pectoris: Secondary | ICD-10-CM | POA: Diagnosis not present

## 2015-06-18 ENCOUNTER — Telehealth: Payer: Self-pay | Admitting: Internal Medicine

## 2015-06-18 MED ORDER — LEVOFLOXACIN 500 MG PO TABS: 500.0000 mg | ORAL_TABLET | Freq: Every day | ORAL | Status: DC

## 2015-06-18 NOTE — Telephone Encounter (Signed)
On call- Hosp w pneumonia in April. Now feels starting again- chills last night, hoarse, cough, SOB. Plan- called levaquin tro [facility]. INB or worse, go to ER

## 2015-07-04 ENCOUNTER — Encounter: Payer: Self-pay | Admitting: Critical Care Medicine

## 2015-07-04 ENCOUNTER — Ambulatory Visit (INDEPENDENT_AMBULATORY_CARE_PROVIDER_SITE_OTHER): Payer: BLUE CROSS/BLUE SHIELD | Admitting: Critical Care Medicine

## 2015-07-04 VITALS — BP 124/58 | HR 82 | Temp 97.5°F | Ht 59.0 in | Wt 236.0 lb

## 2015-07-04 DIAGNOSIS — J4540 Moderate persistent asthma, uncomplicated: Secondary | ICD-10-CM

## 2015-07-04 DIAGNOSIS — J189 Pneumonia, unspecified organism: Secondary | ICD-10-CM

## 2015-07-04 MED ORDER — HYDROCODONE-HOMATROPINE 5-1.5 MG/5ML PO SYRP: 5.0000 mL | ORAL_SOLUTION | Freq: Four times a day (QID) | ORAL | Status: DC | PRN

## 2015-07-04 MED ORDER — LEVOFLOXACIN 500 MG PO TABS: 500.0000 mg | ORAL_TABLET | Freq: Every day | ORAL | Status: DC

## 2015-07-04 MED ORDER — PREDNISONE 10 MG PO TABS: ORAL_TABLET | ORAL | Status: DC

## 2015-07-04 MED ORDER — BENZONATATE 200 MG PO CAPS: ORAL_CAPSULE | ORAL | Status: DC

## 2015-07-04 NOTE — Progress Notes (Signed)
Subjective:    Patient ID: Ann Fuller, female    DOB: 03-Nov-1946, 69 y.o.   MRN: [id number]  HPI 07/04/2015 Chief Complaint  Patient presents with  . Follow-up    coughing, yellow mucus.  no fever. SOB. [place], coughed a lot during the vacation.  Pt still coughing, yellow mucus, and now more dyspneic.  Coughed a lot during the vacation CT chest neg 6/6.  CT sinus neg 03/2015.  levaquin x 7days helped 6/25 No fever, notes some L breast pain from cough, occ dry cough, but also productive.  Notes wheezing at night.  No GERD symptoms  Current Medications, Allergies, Complete Past Medical History, Past Surgical History, Family History, and Social History were reviewed in [facility] record per todays encounter:  07/04/2015  Review of Systems  Constitutional: Negative.   HENT: Positive for ear pain. Negative for postnasal drip, rhinorrhea, sinus pressure, sore throat, trouble swallowing and voice change.   Eyes: Negative.   Respiratory: Positive for cough, choking, chest tightness, shortness of breath and wheezing. Negative for apnea and stridor.   Cardiovascular: Negative.  Negative for chest pain, palpitations and leg swelling.  Gastrointestinal: Negative.  Negative for nausea, vomiting, abdominal pain and abdominal distention.  Genitourinary: Negative.   Musculoskeletal: Negative.  Negative for myalgias and arthralgias.  Skin: Negative.  Negative for rash.  Allergic/Immunologic: Negative.  Negative for environmental allergies and food allergies.  Neurological: Negative.  Negative for dizziness, syncope, weakness and headaches.  Hematological: Negative.  Negative for adenopathy. Does not bruise/bleed easily.  Psychiatric/Behavioral: Negative.  Negative for sleep disturbance and agitation. The patient is not nervous/anxious.        Objective:   Physical Exam Filed Vitals:   07/04/15 1132  BP: 124/58  Pulse: 82  Temp: 97.5 F (36.4 C)    TempSrc: Oral  Height: 4\' 11"  (1.499 m)  Weight: 236 lb (107.049 kg)  SpO2: 98%    Gen: Pleasant, well-nourished, in no distress,  normal affect  ENT: No lesions,  mouth clear,  oropharynx clear, no postnasal drip  Neck: No JVD, no TMG, no carotid bruits  Lungs: No use of accessory muscles, no dullness to percussion, exp wheezes  Cardiovascular: RRR, heart sounds normal, no murmur or gallops, no peripheral edema  Abdomen: soft and NT, no HSM,  BS normal  Musculoskeletal: No deformities, no cyanosis or clubbing  Neuro: alert, non focal  Skin: Warm, no lesions or rashes  No results found.   Recent ct chest reviewed     Assessment & Plan:  I personally reviewed all images and lab data in the [facility] system as well as any outside material available during this office visit and agree with the  radiology impressions.   Moderate persistent asthma in adult without complication Mod persistent asthma with recurrent exacerbation Recent PNA resolved on CT Chest  Plan Repulse prednisone and levaquin 500/d x 5days Extensive hfa coaching>>improved to 50% with spacer  Community acquired pneumonia Cap without boop Now resolved   Ann Fuller was seen today for follow-up.  Diagnoses and all orders for this visit:  Moderate persistent asthma in adult without complication  Community acquired pneumonia  Other orders -     predniSONE (DELTASONE) 10 MG tablet; Take 4 for three days 3 for three days 2 for three days 1 for three days and stop -     levofloxacin (LEVAQUIN) 500 MG tablet; Take 1 tablet (500 mg total) by mouth daily. -  HYDROcodone-homatropine (HYCODAN) 5-1.5 MG/5ML syrup; Take 5 mLs by mouth every 6 (six) hours as needed for cough. -     benzonatate (TESSALON) 200 MG capsule; Take per cough protocol 1 every 6hours    I had an extended discussion with the patient and or family lasting 10 minutes of a 25 minute visit including:  Dx, tx options

## 2015-07-04 NOTE — Patient Instructions (Signed)
Cough protocol with Hycodan/tessalon Prednisone 10mg  Take 4 for three days 3 for three days 2 for three days 1 for three days and stop Levaquin one daily for 7days Stay on symbicort with spacer Stay on Dexilant daily Stay on a reflux diet Return 1 month

## 2015-07-06 NOTE — Assessment & Plan Note (Signed)
Cap without boop Now resolved

## 2015-07-06 NOTE — Assessment & Plan Note (Signed)
Mod persistent asthma with recurrent exacerbation Recent PNA resolved on CT Chest  Plan Repulse prednisone and levaquin 500/d x 5days Extensive hfa coaching>>improved to 50% with spacer

## 2015-08-08 ENCOUNTER — Ambulatory Visit (INDEPENDENT_AMBULATORY_CARE_PROVIDER_SITE_OTHER): Payer: BLUE CROSS/BLUE SHIELD | Admitting: Critical Care Medicine

## 2015-08-08 ENCOUNTER — Encounter: Payer: Self-pay | Admitting: Critical Care Medicine

## 2015-08-08 ENCOUNTER — Ambulatory Visit (INDEPENDENT_AMBULATORY_CARE_PROVIDER_SITE_OTHER)
Admission: RE | Admit: 2015-08-08 | Discharge: 2015-08-08 | Disposition: A | Discharge status: Home / Self Care | Payer: BLUE CROSS/BLUE SHIELD | Source: Ambulatory Visit | Attending: Critical Care Medicine | Admitting: Critical Care Medicine

## 2015-08-08 ENCOUNTER — Telehealth: Payer: Self-pay | Admitting: Critical Care Medicine

## 2015-08-08 VITALS — BP 124/62 | HR 94 | Temp 98.7°F | Ht 59.0 in | Wt 237.0 lb

## 2015-08-08 DIAGNOSIS — J4540 Moderate persistent asthma, uncomplicated: Secondary | ICD-10-CM

## 2015-08-08 DIAGNOSIS — J189 Pneumonia, unspecified organism: Secondary | ICD-10-CM

## 2015-08-08 MED ORDER — LEVOFLOXACIN 500 MG PO TABS: 500.0000 mg | ORAL_TABLET | Freq: Every day | ORAL | Status: DC

## 2015-08-08 MED ORDER — PREDNISONE 10 MG PO TABS: ORAL_TABLET | ORAL | Status: DC

## 2015-08-08 NOTE — Telephone Encounter (Signed)
Patient is returning Crystal's call, she can be reached at [phone].

## 2015-08-08 NOTE — Telephone Encounter (Signed)
Patient returned call

## 2015-08-08 NOTE — Progress Notes (Signed)
Quick Note:  lmomtcb for pt on home and cell #s ______ 

## 2015-08-08 NOTE — Progress Notes (Signed)
Subjective:    Patient ID: Ann Fuller, female    DOB: May 03, 1946, 69 y.o.   MRN: [id number]  HPI 08/09/2015 Chief Complaint  Patient presents with  . 1 month follow up    Pt states she had a bad night on 8.14.16 - pt states she was using her nab machine and albuterol hfa more frequently. Pt c/o prod cough with yellow mucus. Pt denies CP/tightness.   Coughing started in PM of 8/14.  Prod of yellow mucus. No chest pain.  No fever.  No sinus issues.  Pt had a flare of diverticulitis recently as well.  No rhinorrhea.   Pt denies any significant sore throat, nasal congestion or excess secretions, fever, chills, sweats, unintended weight loss, pleurtic or exertional chest pain, orthopnea PND, or leg swelling Pt notes  increase in rescue therapy over baseline,  Also notes nocturnal exacerbations of coughing/wheezing/or dyspnea.  Current Medications, Allergies, Complete Past Medical History, Past Surgical History, Family History, and Social History were reviewed in [place] [facility] record per todays encounter:  08/09/2015   Review of Systems  Constitutional: Positive for fatigue.  HENT: Negative.  Negative for ear pain, postnasal drip, rhinorrhea, sinus pressure, sore throat, trouble swallowing and voice change.   Eyes: Negative.   Respiratory: Positive for cough, chest tightness, shortness of breath and wheezing. Negative for apnea, choking and stridor.   Cardiovascular: Negative.  Negative for chest pain, palpitations and leg swelling.  Gastrointestinal: Negative.  Negative for nausea, vomiting, abdominal pain and abdominal distention.  Genitourinary: Negative.   Musculoskeletal: Negative.  Negative for myalgias and arthralgias.  Skin: Negative.  Negative for rash.  Allergic/Immunologic: Negative.  Negative for environmental allergies and food allergies.  Neurological: Negative.  Negative for dizziness, syncope, weakness and headaches.  Hematological: Negative.   Negative for adenopathy. Does not bruise/bleed easily.  Psychiatric/Behavioral: Negative.  Negative for sleep disturbance and agitation. The patient is not nervous/anxious.        Objective:   Physical Exam Filed Vitals:   08/08/15 1046  BP: 124/62  Pulse: 94  Temp: 98.7 F (37.1 C)  TempSrc: Oral  Height: 4\' 11"  (1.499 m)  Weight: 237 lb (107.502 kg)  SpO2: 97%    Gen: Pleasant, well-nourished, in no distress,  normal affect  ENT: No lesions,  mouth clear,  oropharynx clear, no postnasal drip  Neck: No JVD, no TMG, no carotid bruits  Lungs: No use of accessory muscles, no dullness to percussion, exp wheeze   Cardiovascular: RRR, heart sounds normal, no murmur or gallops, no peripheral edema  Abdomen: soft and NT, no HSM,  BS normal  Musculoskeletal: No deformities, no cyanosis or clubbing  Neuro: alert, non focal  Skin: Warm, no lesions or rashes  CXR reviewed: Dg Chest 2 View  08/08/2015   CLINICAL DATA:  Cough since March.  EXAM: CHEST  2 VIEW  COMPARISON:  Chest x-ray 05/10/2015 and chest CT 05/30/2015  FINDINGS: The cardiac silhouette, mediastinal and hilar contours are within normal limits and stable. There is mild tortuosity and calcification of the thoracic aorta. Stable surgical changes related to a left mastectomy. A left breast prosthesis is noted. The lungs demonstrate chronic bronchitic changes but no infiltrates or effusion.  IMPRESSION: Chronic bronchitic changes but no infiltrates or effusions.   Electronically Signed   By: Marijo Sanes M.D.   On: 08/08/2015 11:37          Assessment & Plan:  I personally reviewed all images and lab  data in the [facility] system as well as any outside material available during this office visit and agree with the  radiology impressions.   Moderate persistent asthma in adult without complication Moderate persistent asthma with cyclic cough features and acute sinusitis without pneumonia No chest x-ray negative for pneumonia  at this visit Plan evaquin for 5 days , use daily Prednisone 10mg  Take 4 for two days three for two days two for two days one for two days Use cough syrup as needed Keep sugar free candy drop in mouth and train self to swallow not clear throat Stay on symbicort     Yariana was seen today for 1 month follow up.  Diagnoses and all orders for this visit:  Pneumonia, organism unspecified -     DG Chest 2 View; Future  Moderate persistent asthma in adult without complication  Other orders -     levofloxacin (LEVAQUIN) 500 MG tablet; Take 1 tablet (500 mg total) by mouth daily. -     predniSONE (DELTASONE) 10 MG tablet; Take 4 for two days three for two days two for two days one for two days

## 2015-08-08 NOTE — Patient Instructions (Addendum)
levaquin for 5 days , use daily Prednisone 10mg  Take 4 for two days three for two days two for two days one for two days Use cough syrup as needed Keep sugar free candy drop in mouth and train self to swallow not clear throat Stay on symbicort Obtain a chest xray today Return 2 months with Dr Melvyn Novas

## 2015-08-08 NOTE — Telephone Encounter (Signed)
I spoke with patient about results and she verbalized understanding and had no questions 

## 2015-08-08 NOTE — Telephone Encounter (Signed)
Result Note     Let the pt know CXR shows NO pneumonia, no change in medications as before  --  lmomtcb x1

## 2015-08-09 NOTE — Assessment & Plan Note (Signed)
Moderate persistent asthma with cyclic cough features and acute sinusitis without pneumonia No chest x-ray negative for pneumonia at this visit Plan evaquin for 5 days , use daily Prednisone 10mg  Take 4 for two days three for two days two for two days one for two days Use cough syrup as needed Keep sugar free candy drop in mouth and train self to swallow not clear throat Stay on symbicort

## 2015-08-14 ENCOUNTER — Other Ambulatory Visit: Payer: Self-pay | Admitting: Critical Care Medicine

## 2015-09-22 ENCOUNTER — Emergency Department (HOSPITAL_COMMUNITY)
Admission: EM | Admit: 2015-09-22 | Discharge: 2015-09-22 | Disposition: A | Discharge status: Home / Self Care | Payer: BLUE CROSS/BLUE SHIELD | Attending: Emergency Medicine | Admitting: Emergency Medicine

## 2015-09-22 ENCOUNTER — Encounter (HOSPITAL_COMMUNITY): Payer: Self-pay | Admitting: Emergency Medicine

## 2015-09-22 ENCOUNTER — Emergency Department (HOSPITAL_COMMUNITY): Payer: BLUE CROSS/BLUE SHIELD

## 2015-09-22 DIAGNOSIS — Z7982 Long term (current) use of aspirin: Secondary | ICD-10-CM | POA: Insufficient documentation

## 2015-09-22 DIAGNOSIS — Z853 Personal history of malignant neoplasm of breast: Secondary | ICD-10-CM | POA: Insufficient documentation

## 2015-09-22 DIAGNOSIS — Z86018 Personal history of other benign neoplasm: Secondary | ICD-10-CM | POA: Insufficient documentation

## 2015-09-22 DIAGNOSIS — K589 Irritable bowel syndrome without diarrhea: Secondary | ICD-10-CM

## 2015-09-22 DIAGNOSIS — Z9049 Acquired absence of other specified parts of digestive tract: Secondary | ICD-10-CM | POA: Insufficient documentation

## 2015-09-22 DIAGNOSIS — Z794 Long term (current) use of insulin: Secondary | ICD-10-CM | POA: Insufficient documentation

## 2015-09-22 DIAGNOSIS — Z90710 Acquired absence of both cervix and uterus: Secondary | ICD-10-CM | POA: Insufficient documentation

## 2015-09-22 DIAGNOSIS — M1990 Unspecified osteoarthritis, unspecified site: Secondary | ICD-10-CM | POA: Insufficient documentation

## 2015-09-22 DIAGNOSIS — E119 Type 2 diabetes mellitus without complications: Secondary | ICD-10-CM | POA: Diagnosis not present

## 2015-09-22 DIAGNOSIS — I10 Essential (primary) hypertension: Secondary | ICD-10-CM | POA: Diagnosis not present

## 2015-09-22 DIAGNOSIS — J45909 Unspecified asthma, uncomplicated: Secondary | ICD-10-CM | POA: Insufficient documentation

## 2015-09-22 DIAGNOSIS — R1012 Left upper quadrant pain: Secondary | ICD-10-CM | POA: Diagnosis present

## 2015-09-22 DIAGNOSIS — K219 Gastro-esophageal reflux disease without esophagitis: Secondary | ICD-10-CM | POA: Diagnosis not present

## 2015-09-22 DIAGNOSIS — K580 Irritable bowel syndrome with diarrhea: Secondary | ICD-10-CM | POA: Insufficient documentation

## 2015-09-22 DIAGNOSIS — Z79899 Other long term (current) drug therapy: Secondary | ICD-10-CM | POA: Diagnosis not present

## 2015-09-22 DIAGNOSIS — Z86010 Personal history of colonic polyps: Secondary | ICD-10-CM | POA: Insufficient documentation

## 2015-09-22 LAB — CBC WITH DIFFERENTIAL/PLATELET
BASOS ABS: 0 10*3/uL (ref 0.0–0.1)
BASOS PCT: 0 %
Eosinophils Absolute: 0.3 10*3/uL (ref 0.0–0.7)
Eosinophils Relative: 4 %
HEMATOCRIT: 34.4 % — AB (ref 36.0–46.0)
HEMOGLOBIN: 11.3 g/dL — AB (ref 12.0–15.0)
LYMPHS PCT: 24 %
Lymphs Abs: 1.9 10*3/uL (ref 0.7–4.0)
MCH: 29.3 pg (ref 26.0–34.0)
MCHC: 32.8 g/dL (ref 30.0–36.0)
MCV: 89.1 fL (ref 78.0–100.0)
Monocytes Absolute: 0.5 10*3/uL (ref 0.1–1.0)
Monocytes Relative: 6 %
NEUTROS ABS: 5.3 10*3/uL (ref 1.7–7.7)
NEUTROS PCT: 66 %
Platelets: 275 10*3/uL (ref 150–400)
RBC: 3.86 MIL/uL — ABNORMAL LOW (ref 3.87–5.11)
RDW: 14 % (ref 11.5–15.5)
WBC: 8 10*3/uL (ref 4.0–10.5)

## 2015-09-22 LAB — COMPREHENSIVE METABOLIC PANEL
ALBUMIN: 3.3 g/dL — AB (ref 3.5–5.0)
ALK PHOS: 53 U/L (ref 38–126)
ALT: 20 U/L (ref 14–54)
AST: 30 U/L (ref 15–41)
Anion gap: 5 (ref 5–15)
BILIRUBIN TOTAL: 0.3 mg/dL (ref 0.3–1.2)
BUN: 13 mg/dL (ref 6–20)
CALCIUM: 8.2 mg/dL — AB (ref 8.9–10.3)
CO2: 30 mmol/L (ref 22–32)
CREATININE: 0.69 mg/dL (ref 0.44–1.00)
Chloride: 100 mmol/L — ABNORMAL LOW (ref 101–111)
GFR calc Af Amer: >60 mL/min (ref >60)
GLUCOSE: 80 mg/dL (ref 65–99)
POTASSIUM: 3.2 mmol/L — AB (ref 3.5–5.1)
Sodium: 135 mmol/L (ref 135–145)
TOTAL PROTEIN: 6.6 g/dL (ref 6.5–8.1)

## 2015-09-22 LAB — I-STAT CHEM 8, ED
BUN: 14 mg/dL (ref 6–20)
CREATININE: 0.8 mg/dL (ref 0.44–1.00)
Calcium, Ion: 1.14 mmol/L (ref 1.13–1.30)
Chloride: 96 mmol/L — ABNORMAL LOW (ref 101–111)
Glucose, Bld: 120 mg/dL — ABNORMAL HIGH (ref 65–99)
HEMATOCRIT: 35 % — AB (ref 36.0–46.0)
HEMOGLOBIN: 11.9 g/dL — AB (ref 12.0–15.0)
POTASSIUM: 3.8 mmol/L (ref 3.5–5.1)
SODIUM: 137 mmol/L (ref 135–145)
TCO2: 28 mmol/L (ref 0–100)

## 2015-09-22 MED ORDER — POTASSIUM CHLORIDE CRYS ER 20 MEQ PO TBCR: 40.0000 meq | EXTENDED_RELEASE_TABLET | Freq: Two times a day (BID) | ORAL | Status: DC

## 2015-09-22 MED ORDER — IOHEXOL 300 MG/ML  SOLN
25.0000 mL | Freq: Once | INTRAMUSCULAR | Status: AC | PRN
  Administered 2015-09-22: 25 mL via ORAL

## 2015-09-22 MED ORDER — PANTOPRAZOLE SODIUM 20 MG PO TBEC: 20.0000 mg | DELAYED_RELEASE_TABLET | Freq: Every day | ORAL | Status: DC

## 2015-09-22 MED ORDER — MORPHINE SULFATE (PF) 4 MG/ML IV SOLN
6.0000 mg | Freq: Once | INTRAVENOUS | Status: AC
  Administered 2015-09-22: 6 mg via INTRAVENOUS
  Filled 2015-09-22: qty 2

## 2015-09-22 MED ORDER — IOHEXOL 300 MG/ML  SOLN
100.0000 mL | Freq: Once | INTRAMUSCULAR | Status: AC | PRN
  Administered 2015-09-22: 100 mL via INTRAVENOUS

## 2015-09-22 MED ORDER — GI COCKTAIL ~~LOC~~
30.0000 mL | Freq: Once | ORAL | Status: AC
  Administered 2015-09-22: 30 mL via ORAL
  Filled 2015-09-22: qty 30

## 2015-09-22 MED ORDER — SODIUM CHLORIDE 0.9 % IV BOLUS (SEPSIS)
1000.0000 mL | Freq: Once | INTRAVENOUS | Status: AC
Start: 2015-09-22 — End: 2015-09-22
  Administered 2015-09-22: 1000 mL via INTRAVENOUS

## 2015-09-22 MED ORDER — RANITIDINE HCL 150 MG/10ML PO SYRP
150.0000 mg | ORAL_SOLUTION | Freq: Once | ORAL | Status: AC
  Administered 2015-09-22: 150 mg via ORAL
  Filled 2015-09-22: qty 10

## 2015-09-22 NOTE — ED Notes (Signed)
MD at bedside. 

## 2015-09-22 NOTE — ED Notes (Signed)
Pt states that she has been having pain on the left side of her abdomen with intermittent diarrhea for 2 months.  Has been seen by physician and tx with abx but not sure which ones.

## 2015-09-22 NOTE — ED Provider Notes (Signed)
CSN: [id number]     Arrival date & time 09/22/15  1049 History  By signing my name below, I, Erling Conte, attest that this documentation has been prepared under the direction and in the presence of Merrily Pew, MD. Electronically Signed: Erling Conte, ED Scribe. 09/22/2015. 2:15 PM.    Chief Complaint  Patient presents with  . Abdominal Pain    Patient is a 69 y.o. female presenting with abdominal pain. The history is provided by the patient and the spouse. No language interpreter was used.  Abdominal Pain Pain location:  LUQ and LLQ Pain radiates to:  Does not radiate Pain severity:  Moderate Onset quality:  Gradual Duration: 2 months. Timing:  Intermittent Progression:  Waxing and waning Chronicity:  Chronic Context: not recent illness, not recent travel, not sick contacts and not suspicious food intake   Relieved by:  Nothing Worsened by:  Nothing tried Ineffective treatments: antibiotics. Associated symptoms: diarrhea   Associated symptoms: no chills, no dysuria, no fever, no hematuria, no nausea and no vomiting     HPI Comments: Ann Fuller is a 69 y.o. female with a h/o diverticulitis and IBS who presents to the Emergency Department complaining of intermittent, left sided abdominal pain onset 2 months. Pt reports associated intermittent diarrhea. She states she was dx with diverticulitis and IBS by her gastroenterologist Dr. Jabier Mutton, 4 years ago when she had a colonoscopy. She was recently seen by her PCP, Dr. Willey Blade and put on antibiotics but she states they have provided no relief; Dr. Willey Blade told her to come her evaluation of her persistent pain. Pt cannot recall name of antibiotic. She states the antibiotics relieved the diarrhea but not the pain. Pt has not had any other meds PTA. Pt reports that nothing makes the pain better. She denies any recent sick contacts. Pt denies any suspicious food intake. She notes she has a h/o UTIs but denies any urinary symptoms. She  also admits she has a h/o kidney stones when she was a teenager but denies any recent kidney stones since. She denies any fever, chills, nausea or vomiting.  Past Medical History  Diagnosis Date  . Essential hypertension, benign   . Type 2 diabetes mellitus   . Adenomatous colon polyp   . Diverticulitis   . Internal hemorrhoid   . IBS (irritable bowel syndrome)   . GERD (gastroesophageal reflux disease)   . Dyspepsia   . Hiatal hernia   . Tubular adenoma 11/2010  . Bulging lumbar disc   . PONV (postoperative nausea and vomiting)   . Asthma   . Arthritis   . Right shoulder pain   . Breast cancer [id number]   Past Surgical History  Procedure Laterality Date  . Cholecystectomy    . Tubal ligation    . Esophagogastroduodenoscopy  12/13/2010    Dr. Jennet Maduro hernia, fundal gland type polyps  . Colonoscopy  12/13/2010    Dr. Margarito Courser pancolonic diverticular.tubular adenoma  . Colonoscopy  07/13/2003  . Appendectomy    . Mastectomy  1995  . Back surgery  2007    Lumbar fusion  . Tonsillectomy    . Abdominal hysterectomy    . Cataract extraction w/phaco Left 08/27/2013    Procedure: CATARACT EXTRACTION PHACO AND INTRAOCULAR LENS PLACEMENT (IOC);  Surgeon: Tonny Branch, MD;  Location: [facility];  Service: Ophthalmology;  Laterality: Left;  CDE 4.25  . Eye surgery Left     Left KPE 08/27/13  . Cataract extraction w/phaco Right 09/24/2013  Procedure: CATARACT EXTRACTION PHACO AND INTRAOCULAR LENS PLACEMENT (IOC);  Surgeon: Tonny Branch, MD;  Location: [facility];  Service: Ophthalmology;  Laterality: Right;  CDE:  8.16   Family History  Problem Relation Age of Onset  . Colon cancer Mother   . Heart attack Father   . Heart attack Sister   . Stroke Sister   . Cancer - Lung Sister    Social History  Substance Use Topics  . Smoking status: Never Smoker   . Smokeless tobacco: Never Used  . Alcohol Use: No   OB History    No data available     Review of Systems  Constitutional:  Negative for fever and chills.  Gastrointestinal: Positive for abdominal pain and diarrhea. Negative for nausea and vomiting.  Genitourinary: Negative for dysuria and hematuria.  All other systems reviewed and are negative.     Allergies  Ceftin; Codeine; Metformin and related; Nexium; Omnicef; and Sulfa antibiotics  Home Medications   Prior to Admission medications   Medication Sig Start Date End Date Taking? Authorizing Provider  albuterol (VENTOLIN HFA) 108 (90 BASE) MCG/ACT inhaler Inhale 2 puffs into the lungs every 6 (six) hours as needed. Shortness of Breath   Yes Historical Provider, MD  amLODipine (NORVASC) 5 MG tablet Take 5 mg by mouth at bedtime.    Yes Historical Provider, MD  aspirin 81 MG tablet Take 81 mg by mouth at bedtime.    Yes Historical Provider, MD  atorvastatin (LIPITOR) 40 MG tablet Take 40 mg by mouth daily.     Yes Historical Provider, MD  baclofen (LIORESAL) 10 MG tablet Take 5-10 mg by mouth 3 (three) times daily as needed for muscle spasms.   Yes Historical Provider, MD  benzonatate (TESSALON) 200 MG capsule Take per cough protocol 1 every 6hours 07/04/15  Yes Elsie Stain, MD  chlorthalidone (HYGROTON) 25 MG tablet Take 25 mg by mouth daily.     Yes Historical Provider, MD  dexlansoprazole (DEXILANT) 60 MG capsule Take 60 mg by mouth daily.    Yes Historical Provider, MD  dicyclomine (BENTYL) 10 MG capsule TAKE ONE CAPSULE BY MOUTH PRIOR TO MEALS AS NEEDED NO MORE THAN FOUR TIMES DAILY. MAY CAUSE DROWSINESS 11/09/14  Yes Mahala Menghini, PA-C  doxazosin (CARDURA) 2 MG tablet Take 2 mg by mouth daily.   Yes Historical Provider, MD  exenatide (BYETTA 10 MCG PEN) 10 MCG/0.04ML SOLN Inject 10 mcg into the skin 2 (two) times daily with a meal.    Yes Historical Provider, MD  HYDROcodone-acetaminophen (NORCO) 10-325 MG tablet Take 1 tablet by mouth every 6 (six) hours as needed for moderate pain.   Yes Historical Provider, MD  Insulin Aspart (NOVOLOG FLEXPEN )  Inject 10 Units into the skin 2 (two) times daily before a meal.    Yes Historical Provider, MD  Insulin Glargine (LANTUS) 100 UNIT/ML Solostar Pen Inject 30 Units into the skin every morning.    Yes Historical Provider, MD  ipratropium-albuterol (DUONEB) 0.5-2.5 (3) MG/3ML SOLN Take 3 mLs by nebulization every 4 (four) hours. Patient taking differently: Take 3 mLs by nebulization every 4 (four) hours as needed.  04/10/15  Yes Asencion Noble, MD  LORazepam (ATIVAN) 1 MG tablet Take 1 mg by mouth 2 (two) times daily as needed. Anxiety/Sleep   Yes Historical Provider, MD  losartan (COZAAR) 100 MG tablet Take 100 mg by mouth daily.   Yes Historical Provider, MD  SYMBICORT 160-4.5 MCG/ACT inhaler USE 2 PUFFS 2  TIMES A DAY 08/15/15  Yes Elsie Stain, MD  temazepam (RESTORIL) 15 MG capsule Take 15 mg by mouth at bedtime as needed. Sleep   Yes Historical Provider, MD  pantoprazole (PROTONIX) 20 MG tablet Take 1 tablet (20 mg total) by mouth daily. 09/22/15   Merrily Pew, MD  potassium chloride SA (K-DUR,KLOR-CON) 20 MEQ tablet Take 2 tablets (40 mEq total) by mouth 2 (two) times daily. 09/22/15 09/29/15  Merrily Pew, MD   Triage Vitals: BP 143/57 mmHg  Pulse 87  Temp(Src) 98 F (36.7 C) (Oral)  Resp 18  Ht 4\' 11"  (1.499 m)  Wt 226 lb (102.513 kg)  BMI 45.62 kg/m2  SpO2 98%  Physical Exam  Constitutional: She is oriented to person, place, and time. She appears well-developed and well-nourished. No distress.  HENT:  Head: Normocephalic and atraumatic.  Eyes: Conjunctivae and EOM are normal.  Neck: Neck supple. No tracheal deviation present.  Cardiovascular: Normal rate.   Pulmonary/Chest: Effort normal. No respiratory distress.  Abdominal: Soft. Bowel sounds are normal. There is tenderness (diffuse). There is no rebound, no guarding and no CVA tenderness.  Musculoskeletal: Normal range of motion.  No midline back pain  Neurological: She is alert and oriented to person, place, and time.  Skin:  Skin is warm and dry.  Psychiatric: She has a normal mood and affect. Her behavior is normal.  Nursing note and vitals reviewed.   ED Course  Procedures (including critical care time)  DIAGNOSTIC STUDIES: Oxygen Saturation is 98% on RA, normal by my interpretation.    COORDINATION OF CARE:    Labs Review Labs Reviewed  CBC WITH DIFFERENTIAL/PLATELET - Abnormal; Notable for the following:    RBC 3.86 (*)    Hemoglobin 11.3 (*)    HCT 34.4 (*)    All other components within normal limits  COMPREHENSIVE METABOLIC PANEL - Abnormal; Notable for the following:    Potassium 3.2 (*)    Chloride 100 (*)    Calcium 8.2 (*)    Albumin 3.3 (*)    All other components within normal limits  I-STAT CHEM 8, ED - Abnormal; Notable for the following:    Chloride 96 (*)    Glucose, Bld 120 (*)    Hemoglobin 11.9 (*)    HCT 35.0 (*)    All other components within normal limits    Imaging Review Ct Abdomen Pelvis W Contrast  09/22/2015   CLINICAL DATA:  Abdominal pain. Rule out diverticulitis. Diarrhea 2 months.  EXAM: CT ABDOMEN AND PELVIS WITH CONTRAST  TECHNIQUE: Multidetector CT imaging of the abdomen and pelvis was performed using the standard protocol following bolus administration of intravenous contrast.  CONTRAST:  36mL OMNIPAQUE IOHEXOL 300 MG/ML SOLN, 157mL OMNIPAQUE IOHEXOL 300 MG/ML SOLN  COMPARISON:  CT abdomen pelvis 11/21/2010  FINDINGS: Lower chest: Lung bases are clear. Left breast implant. Bilateral mastectomy.  Hepatobiliary: Gallbladder surgically absent. Biliary duct dilatation. Common bile duct measures up to 11 mm in diameter. This has progressed since prior study. No obstructing mass lesion identified. No focal liver lesion.  Pancreas: Negative for pancreatic mass or edema or calcification  Spleen: Negative  Adrenals/Urinary Tract: Normal kidneys. No renal mass or obstruction or stone. Urinary bladder normal. Pelvic floor laxity noted.  Stomach/Bowel: Negative for bowel  obstruction. Diverticulosis in the colon diffusely of a mild-to-moderate degree. No evidence of diverticulitis. No bowel mass or edema. Appendix not visualized.  Vascular/Lymphatic: Atherosclerotic calcification aorta iliac and visceral arteries without aneurysm.  Reproductive:  Hysterectomy.  No pelvic mass.  Other: No free fluid or free air.  Negative for adenopathy.  Musculoskeletal: Surgical hardware fusion L4-5 with grade 1 anterior slip L4-5. Negative for fracture or mass.  IMPRESSION: Colonic diverticulosis without diverticulitis.  Postop cholecystectomy. Common bile duct 11 mm, mildly dilated and larger than that seen in 2011. Correlate with LFT. No obstructing mass lesion.   Electronically Signed   By: Franchot Gallo M.D.   On: 09/22/2015 13:10   I have personally reviewed and evaluated these images and lab results as part of my medical decision-making.   EKG Interpretation None      MDM   Final diagnoses:  IBS (irritable bowel syndrome)   Symptoms improved. Labs negative aside from hypokalemia. CT negative. Likely IBS flare, will continue to follow up with GI. Will FU w/ PCP in 1 week for repeat potassium.   I have personally and contemperaneously reviewed labs and imaging and used in my decision making as above.   A medical screening exam was performed and I feel the patient has had an appropriate workup for their chief complaint at this time and likelihood of emergent condition existing is low. They have been counseled on decision, discharge, follow up and which symptoms necessitate immediate return to the emergency department. They or their family verbally stated understanding and agreement with plan and discharged in stable condition.   I personally performed the services described in this documentation, which was scribed in my presence. The recorded information has been reviewed and is accurate.      Merrily Pew, MD 09/22/15 1415

## 2015-09-22 NOTE — ED Notes (Signed)
Patient transported to CT 

## 2015-09-29 ENCOUNTER — Other Ambulatory Visit: Payer: Self-pay

## 2015-09-29 DIAGNOSIS — Z1231 Encounter for screening mammogram for malignant neoplasm of breast: Secondary | ICD-10-CM

## 2015-10-10 ENCOUNTER — Encounter: Payer: Self-pay | Admitting: Nurse Practitioner

## 2015-10-10 ENCOUNTER — Ambulatory Visit (INDEPENDENT_AMBULATORY_CARE_PROVIDER_SITE_OTHER): Payer: BLUE CROSS/BLUE SHIELD | Admitting: Nurse Practitioner

## 2015-10-10 ENCOUNTER — Ambulatory Visit (INDEPENDENT_AMBULATORY_CARE_PROVIDER_SITE_OTHER): Payer: BLUE CROSS/BLUE SHIELD | Admitting: Internal Medicine

## 2015-10-10 ENCOUNTER — Other Ambulatory Visit: Payer: Self-pay

## 2015-10-10 ENCOUNTER — Other Ambulatory Visit (INDEPENDENT_AMBULATORY_CARE_PROVIDER_SITE_OTHER): Payer: BLUE CROSS/BLUE SHIELD

## 2015-10-10 ENCOUNTER — Encounter: Payer: Self-pay | Admitting: Internal Medicine

## 2015-10-10 VITALS — BP 106/70 | HR 110 | Ht 59.0 in | Wt 229.4 lb

## 2015-10-10 VITALS — BP 140/82 | HR 94 | Temp 97.3°F | Ht 59.0 in | Wt 226.4 lb

## 2015-10-10 DIAGNOSIS — I10 Essential (primary) hypertension: Secondary | ICD-10-CM | POA: Diagnosis not present

## 2015-10-10 DIAGNOSIS — R05 Cough: Secondary | ICD-10-CM | POA: Diagnosis not present

## 2015-10-10 DIAGNOSIS — D126 Benign neoplasm of colon, unspecified: Secondary | ICD-10-CM

## 2015-10-10 DIAGNOSIS — J4540 Moderate persistent asthma, uncomplicated: Secondary | ICD-10-CM

## 2015-10-10 DIAGNOSIS — K589 Irritable bowel syndrome without diarrhea: Secondary | ICD-10-CM

## 2015-10-10 DIAGNOSIS — R058 Other specified cough: Secondary | ICD-10-CM

## 2015-10-10 DIAGNOSIS — Z6841 Body Mass Index (BMI) 40.0 and over, adult: Secondary | ICD-10-CM

## 2015-10-10 DIAGNOSIS — Z86010 Personal history of colonic polyps: Secondary | ICD-10-CM

## 2015-10-10 LAB — CBC WITH DIFFERENTIAL/PLATELET
BASOS ABS: 0 10*3/uL (ref 0.0–0.1)
BASOS PCT: 0.1 % (ref 0.0–3.0)
Eosinophils Absolute: 0.2 10*3/uL (ref 0.0–0.7)
Eosinophils Relative: 1.4 % (ref 0.0–5.0)
HEMATOCRIT: 35.8 % — AB (ref 36.0–46.0)
HEMOGLOBIN: 11.7 g/dL — AB (ref 12.0–15.0)
LYMPHS PCT: 23.9 % (ref 12.0–46.0)
Lymphs Abs: 3.5 10*3/uL (ref 0.7–4.0)
MCHC: 32.7 g/dL (ref 30.0–36.0)
MCV: 87.8 fl (ref 78.0–100.0)
MONOS PCT: 9 % (ref 3.0–12.0)
Monocytes Absolute: 1.3 10*3/uL — ABNORMAL HIGH (ref 0.1–1.0)
NEUTROS ABS: 9.5 10*3/uL — AB (ref 1.4–7.7)
NEUTROS PCT: 65.6 % (ref 43.0–77.0)
PLATELETS: 378 10*3/uL (ref 150.0–400.0)
RBC: 4.08 Mil/uL (ref 3.87–5.11)
RDW: 14.9 % (ref 11.5–15.5)
WBC: 14.5 10*3/uL — AB (ref 4.0–10.5)

## 2015-10-10 MED ORDER — MONTELUKAST SODIUM 10 MG PO TABS: 10.0000 mg | ORAL_TABLET | Freq: Every day | ORAL | Status: DC

## 2015-10-10 MED ORDER — PEG 3350-KCL-NA BICARB-NACL 420 G PO SOLR: 4000.0000 mL | Freq: Once | ORAL | Status: DC

## 2015-10-10 MED ORDER — FAMOTIDINE 20 MG PO TABS: ORAL_TABLET | ORAL | Status: DC

## 2015-10-10 MED ORDER — PREDNISONE 10 MG PO TABS: ORAL_TABLET | ORAL | Status: DC

## 2015-10-10 MED ORDER — VALSARTAN 160 MG PO TABS: 160.0000 mg | ORAL_TABLET | Freq: Every day | ORAL | Status: DC

## 2015-10-10 NOTE — Assessment & Plan Note (Signed)
For reasons that may related to vascular permability and nitric oxide pathways but not elevated  bradykinin levels (as seen with  ACEi use) losartan in the generic form has been reported now from mulitple sources  to cause a similar pattern of non-specific  upper airway symptoms as seen with acei.   This has not been reported with exposure to the other ARB's to date, so it seems reasonable for now to try either generic diovan or avapro if ARB needed or use an alternative class altogether.  See:  Lelon Frohlich Allergy Asthma Immunol  2008: 101: p [phone]    Try diovan 160 mg daily instead of losartan

## 2015-10-10 NOTE — Assessment & Plan Note (Signed)
Not clear she has asthma at all as already on max symbicort and breaks right thru it. See uacs

## 2015-10-10 NOTE — Patient Instructions (Addendum)
Stop symbicort and start dulera 100 Take 2 puffs first thing in am and then another 2 puffs about 12 hours later.   singulair 10 mg one each pm   Stop cozar and start diovan 160 mg daily instead   Only use your albuterol as a rescue medication to be used if you can't catch your breath by resting or doing a relaxed purse lip breathing pattern.  - The less you use it, the better it will work when you need it. - Ok to use up to 2 puffs  every 4 hours if you must but call for immediate appointment if use goes up over your usual need - Don't leave home without it !!  (think of it like the spare tire for your car)   Prednisone 10 mg take  4 each am x 2 days,   2 each am x 2 days,  1 each am x 2 days and stop   Pepcid ac 20 mg and chlorpheniramine 4 mg 2 at bedtime   For drainage / throat tickle try take CHLORPHENIRAMINE  4 mg - take one every 4 hours as needed - available over the counter- may cause drowsiness so start with just a bedtime dose or two and see how you tolerate it before trying in daytime    GERD (REFLUX)  is an extremely common cause of respiratory symptoms just like yours , many times with no obvious heartburn at all.    It can be treated with medication, but also with lifestyle changes including elevation of the head of your bed (ideally with 6 inch  bed blocks),  Smoking cessation, avoidance of late meals, excessive alcohol, and avoid fatty foods, chocolate, peppermint, colas, red wine, and acidic juices such as orange juice.  NO MINT OR MENTHOL PRODUCTS SO NO COUGH DROPS  USE SUGARLESS CANDY INSTEAD (Jolley ranchers or Stover's or Life Savers) or even ice chips will also do - the key is to swallow to prevent all throat clearing. NO OIL BASED VITAMINS - use powdered substitutes.    Please remember to go to the lab  department downstairs for your tests - we will call you with the results when they are available.  Please schedule a follow up office visit in 2  weeks, sooner if  needed to see Tammy NP or me with all meds in hand

## 2015-10-10 NOTE — Progress Notes (Signed)
Referring Provider: Asencion Noble, MD Primary Care Physician:  Asencion Noble, MD Primary GI:  Dr. Gala Romney  Chief Complaint  Patient presents with  . Abdominal Pain    HPI:   69 year old female presents for follow-up on abdominal pain. Last seen in our office 08/25/2014. Has a history of GERD, diverticulitis, IBS. At her last visit she was back to baseline. She has a flare of her abdominal pain typically when she is upset and/or eats certain foods including salads and nuts which other worst. Was recently seen in the emergency department 09/22/2015 for abdominal pain, ER notes reviewed. CT revealed diverticulosis without diverticulitis, common bile duct 11 mm mildly dilated larger than in 2011, no obstructing mass lesion seen, status post cholecystectomy. Labs normal in the ER including LFTs. Diagnosed with likely IBS flare.  Today she states she's had a rough year, but is feeling beter today. She was having diarrhea, fecal urgency, and abdominal pain. Symptoms improved but not quite back to baseline. Is taking Bentyl and Dexilant which helps. Is eating vegetables, fish, chicken, and bread which is tolerable for her. Cannot eat corn, salads, nuts. Her pain is left-sided. No pain today, just residual soreness. Was put on antibiotics by PCP which she feels helped, has finished the antibiotic course. At baseline has 2 bowel movements a day which are softer but not liquid. Seems to only be symptomatic with flares. This year has flares about once a month. She thinks she may have seen some blood in her stool once a few weeks ago, did recently have beets at that time. Denies any other hematochezia or melena. Denies fever, chills, N/V, unintentional weight loss. Denies chest pain, dyspnea, dizziness, lightheadedness, syncope, near syncope. Denies any other upper or lower GI symptoms.  She is due for a colonoscopy in 1.5 months, offered to schedule this now to avoid a repeat office visit.  Past Medical History    Diagnosis Date  . Essential hypertension, benign   . Type 2 diabetes mellitus ([place])   . Adenomatous colon polyp   . Diverticulitis   . Internal hemorrhoid   . IBS (irritable bowel syndrome)   . GERD (gastroesophageal reflux disease)   . Dyspepsia   . Hiatal hernia   . Tubular adenoma 11/2010  . Bulging lumbar disc   . PONV (postoperative nausea and vomiting)   . Asthma   . Arthritis   . Right shoulder pain   . Breast cancer [facility]) [id number]    Past Surgical History  Procedure Laterality Date  . Cholecystectomy    . Tubal ligation    . Esophagogastroduodenoscopy  12/13/2010    Dr. Jennet Maduro hernia, fundal gland type polyps  . Colonoscopy  12/13/2010    Dr. Margarito Courser pancolonic diverticular.tubular adenoma  . Colonoscopy  07/13/2003  . Appendectomy    . Mastectomy  1995  . Back surgery  2007    Lumbar fusion  . Tonsillectomy    . Abdominal hysterectomy    . Cataract extraction w/phaco Left 08/27/2013    Procedure: CATARACT EXTRACTION PHACO AND INTRAOCULAR LENS PLACEMENT (IOC);  Surgeon: Tonny Branch, MD;  Location: [facility];  Service: Ophthalmology;  Laterality: Left;  CDE 4.25  . Eye surgery Left     Left KPE 08/27/13  . Cataract extraction w/phaco Right 09/24/2013    Procedure: CATARACT EXTRACTION PHACO AND INTRAOCULAR LENS PLACEMENT (IOC);  Surgeon: Tonny Branch, MD;  Location: [facility];  Service: Ophthalmology;  Laterality: Right;  CDE:  8.16    Current Outpatient  Prescriptions  Medication Sig Dispense Refill  . albuterol (VENTOLIN HFA) 108 (90 BASE) MCG/ACT inhaler Inhale 2 puffs into the lungs every 6 (six) hours as needed. Shortness of Breath    . amLODipine (NORVASC) 5 MG tablet Take 5 mg by mouth at bedtime.     Marland Kitchen aspirin 81 MG tablet Take 81 mg by mouth at bedtime.     Marland Kitchen atorvastatin (LIPITOR) 40 MG tablet Take 40 mg by mouth daily.      . chlorthalidone (HYGROTON) 25 MG tablet Take 25 mg by mouth daily.      Marland Kitchen dexlansoprazole (DEXILANT) 60 MG capsule Take 60 mg  by mouth daily.     Marland Kitchen dicyclomine (BENTYL) 10 MG capsule TAKE ONE CAPSULE BY MOUTH PRIOR TO MEALS AS NEEDED NO MORE THAN FOUR TIMES DAILY. MAY CAUSE DROWSINESS 120 capsule 3  . exenatide (BYETTA 10 MCG PEN) 10 MCG/0.04ML SOLN Inject 10 mcg into the skin 2 (two) times daily with a meal.     . Insulin Aspart (NOVOLOG FLEXPEN I[place]) Inject 10 Units into the skin 2 (two) times daily before a meal.     . Insulin Glargine (LANTUS) 100 UNIT/ML Solostar Pen Inject 30 Units into the skin every morning.     Marland Kitchen ipratropium-albuterol (DUONEB) 0.5-2.5 (3) MG/3ML SOLN Take 3 mLs by nebulization every 4 (four) hours. (Patient taking differently: Take 3 mLs by nebulization every 4 (four) hours as needed. ) 360 mL 6  . LORazepam (ATIVAN) 1 MG tablet Take 1 mg by mouth 2 (two) times daily as needed. Anxiety/Sleep    . losartan (COZAAR) 100 MG tablet Take 100 mg by mouth daily.    . SYMBICORT 160-4.5 MCG/ACT inhaler USE 2 PUFFS 2 TIMES A DAY 10.2 g 3   No current facility-administered medications for this visit.    Allergies as of 10/10/2015 - Review Complete 10/10/2015  Allergen Reaction Noted  . Ceftin [cefuroxime axetil] Swelling 05/02/2012  . Codeine Swelling   . Metformin and related Diarrhea 11/26/2012  . Nexium [esomeprazole magnesium] Diarrhea 05/02/2012  . Omnicef [cefdinir] Diarrhea 05/02/2012  . Sulfa antibiotics Rash 05/02/2012    Family History  Problem Relation Age of Onset  . Colon cancer Mother   . Heart attack Father   . Heart attack Sister   . Stroke Sister   . Cancer - Lung Sister     Social History   Social History  . Marital Status: Married    Spouse Name: N/A  . Number of Children: N/A  . Years of Education: N/A   Social History Main Topics  . Smoking status: Never Smoker   . Smokeless tobacco: Never Used  . Alcohol Use: No  . Drug Use: No  . Sexual Activity: Yes    Birth Control/ Protection: Surgical   Other Topics Concern  . None   Social History Narrative     Review of Systems: General: Negative for anorexia, weight loss, fever, chills, fatigue, weakness.  ENT: Negative for hoarseness, difficulty swallowing. CV: Negative for chest pain, angina, palpitations, peripheral edema.  Respiratory: Negative for dyspnea at rest, cough, sputum, wheezing.  GI: See history of present illness. Endo: Negative for unusual weight change.    Physical Exam: BP 140/82 mmHg  Pulse 94  Temp(Src) 97.3 F (36.3 C) (Oral)  Ht 4\' 11"  (1.499 m)  Wt 226 lb 6.4 oz (102.694 kg)  BMI 45.70 kg/m2 General:   Alert and oriented. Pleasant and cooperative. Well-nourished and well-developed.  Head:  Normocephalic and  atraumatic. Eyes:  Without icterus, sclera clear and conjunctiva pink.  Ears:  Normal auditory acuity. Cardiovascular:  S1, S2 present without murmurs appreciated. Extremities without clubbing or edema. Respiratory:  Clear to auscultation bilaterally. No wheezes, rales, or rhonchi. No distress.  Gastrointestinal:  +BS, soft, non-tender and non-distended. No HSM noted. No guarding or rebound. No masses appreciated.  Rectal:  Deferred  Neurologic:  Alert and oriented x4;  grossly normal neurologically. Psych:  Alert and cooperative. Normal mood and affect. Heme/Lymph/Immune: No excessive bruising noted.    10/10/2015 9:24 AM

## 2015-10-10 NOTE — Assessment & Plan Note (Addendum)
-   CT sinus 04/02/15 > No evidence of sinusitis  Symptoms are markedly disproportionate to objective findings and not clear this is a lung problem but pt does appear to have difficult airway management issues.   DDX of  difficult airways management all start with A and  include Adherence, Ace Inhibitors, Acid Reflux, Active Sinus Disease, Alpha 1 Antitripsin deficiency, Anxiety masquerading as Airways dz,  ABPA,  allergy(esp in young), Aspiration (esp in elderly), Adverse effects of meds,  Active smokers, A bunch of PE's (a small clot burden can't cause this syndrome unless there is already severe underlying pulm or vascular dz with poor reserve) plus two Bs  = Bronchiectasis and Beta blocker use..and one C= CHF  Adherence is always the initial "prime suspect" and is a multilayered concern that requires a "trust but verify" approach in every patient - starting with knowing how to use medications, especially inhalers, correctly, keeping up with refills and understanding the fundamental difference between maintenance and prns vs those medications only taken for a very short course and then stopped and not refilled.  - very shaky on details of care - The proper method of use, as well as anticipated side effects, of a metered-dose inhaler are discussed and demonstrated to the patient. Improved effectiveness after extensive coaching during this visit to a level of approximately  50% so try dulera 100 2 bid to cover asthma possibility  ? Acid (or non-acid) GERD > always difficult to exclude as up to 75% of pts in some series report no assoc GI/ Heartburn symptoms> rec max (24h)  acid suppression and diet restrictions/ reviewed and instructions given in writing.  ? Allergy > suggested by steroid resp component > check profile/ add singulair as new maint rx/ Prednisone 10 mg take  4 each am x 2 days,   2 each am x 2 days,  1 each am x 2 days and stop   I had an extended discussion with the patient reviewing all  relevant studies completed to date and  lasting 25 minutes of a 40 minute visit    Each maintenance medication was reviewed in detail including most importantly the difference between maintenance and prns and under what circumstances the prns are to be triggered using an action plan format that is not reflected in the computer generated alphabetically organized AVS.    Please see instructions for details which were reviewed in writing and the patient given a copy highlighting the part that I personally wrote and discussed at today's ov.

## 2015-10-10 NOTE — Patient Instructions (Signed)
1. We will schedule your procedure for you. 2. Continue to take Bentyl for your IBS symptoms. 3. Return for follow-up in 6 months or as needed.

## 2015-10-10 NOTE — Assessment & Plan Note (Signed)
Complicated by hbp/ hyperlipidemia/ aodm   Body mass index is 46.31 kg/(m^2).  No results found for: TSH   Contributing to gerd tendency/ doe/reviewed the need and the process to achieve and maintain neg calorie balance > defer f/u primary care including intermittently monitoring thyroid status

## 2015-10-10 NOTE — Progress Notes (Signed)
Subjective:     Patient ID: Ann Fuller, female   DOB: 11/02/1946,    MRN: [id number]  HPI  37 yobf never smoker with tendency to reflux x 2011 then onset wheeze / cough labeled as asthma and worse x early 2016  always with better short pred / abx but only for only for a few week the cough starts again despite maint rx with symbicort  And followed by Dr Gretta Began referred her [place] 10/10/2015    10/10/2015   1st office visit/ [facility]/ transfer of care.   Chief Complaint  Patient presents with  . Follow-up    Pt states that her cough has been worse- producing some yellow sputum. She notices wheezing at night and is using neb 2 x per wk on average.   last visit was 08/08/15 no pna on cxr or CT and  rx with levaquin and predisone = > 100% better and maint symbicort / dexilant / cozar but gradaully worse x 2weeks Even when better doe x [employer] leaning on basket  Cough is worse at hs typically not better on tessilon or mucinex dm    No obvious day to day or daytime variability or assoc change in doe or cp or chest tightness,   or overt sinus or hb symptoms. No unusual exp hx or h/o childhood pna/ asthma or knowledge of premature birth.  Sleeping ok without nocturnal  or early am exacerbation  of respiratory  c/o's or need for noct saba. Also denies any obvious fluctuation of symptoms with weather or environmental changes or other aggravating or alleviating factors except as outlined above   Current Medications, Allergies, Complete Past Medical History, Past Surgical History, Family History, and Social History were reviewed in [employer] record.  ROS  The following are not active complaints unless bolded sore throat, dysphagia, dental problems, itching, sneezing,  nasal congestion or excess/ purulent secretions, ear ache,   fever, chills, sweats, unintended wt loss, classically pleuritic or exertional cp, hemoptysis,  orthopnea pnd or leg swelling, presyncope,  palpitations, abdominal pain, anorexia, nausea, vomiting, diarrhea  or change in bowel or bladder habits, change in stools or urine, dysuria,hematuria,  rash, arthralgias, visual complaints, headache, numbness, weakness or ataxia or problems with walking or coordination,  change in mood/affect or memory.        Review of Systems     Objective:   Physical Exam    amb obese bf moderately hoarse, mild pseudowheeze chewing mint gum   Wt Readings from Last 3 Encounters:  10/10/15 229 lb 6.4 oz (104.055 kg)  10/10/15 226 lb 6.4 oz (102.694 kg)  09/22/15 226 lb (102.513 kg)    Vital signs reviewed  HEENT: nl dentition, turbinates, and orophanx. Nl external ear canals without cough reflex   NECK :  without JVD/Nodes/TM/ nl carotid upstrokes bilaterally   LUNGS: no acc muscle use, clear to A and P bilaterally without cough on insp or exp maneuvers   CV:  RRR  no s3 or murmur or increase in P2, no edema   ABD:  soft and nontender with nl excursion in the supine position. No bruits or organomegaly, bowel sounds nl  MS:  warm without deformities, calf tenderness, cyanosis or clubbing  SKIN: warm and dry without lesions    NEURO:  alert, approp, no deficits   I personally reviewed images and agree with radiology impression as follows:  CXR:   98/29/16 Chronic bronchitic changes but no infiltrates or effusions.  Labs ordered 10/10/2015 allergy profile       Assessment:        Outpatient Encounter Prescriptions as of 10/10/2015  Medication Sig  . albuterol (VENTOLIN HFA) 108 (90 BASE) MCG/ACT inhaler Inhale 2 puffs into the lungs every 6 (six) hours as needed. Shortness of Breath  . amLODipine (NORVASC) 5 MG tablet Take 5 mg by mouth at bedtime.   Marland Kitchen aspirin 81 MG tablet Take 81 mg by mouth at bedtime.   Marland Kitchen atorvastatin (LIPITOR) 40 MG tablet Take 40 mg by mouth daily.    . benzonatate (TESSALON) 200 MG capsule Take 200 mg by mouth 3 (three) times daily as needed for cough.  .  chlorthalidone (HYGROTON) 25 MG tablet Take 25 mg by mouth daily.    Marland Kitchen dexlansoprazole (DEXILANT) 60 MG capsule Take 60 mg by mouth daily.   Marland Kitchen dextromethorphan-guaiFENesin (MUCINEX DM) 30-600 MG 12hr tablet Take 1 tablet by mouth 2 (two) times daily as needed for cough.  . dicyclomine (BENTYL) 10 MG capsule TAKE ONE CAPSULE BY MOUTH PRIOR TO MEALS AS NEEDED NO MORE THAN FOUR TIMES DAILY. MAY CAUSE DROWSINESS  . exenatide (BYETTA 10 MCG PEN) 10 MCG/0.04ML SOLN Inject 10 mcg into the skin 2 (two) times daily with a meal.   . Insulin Aspart (NOVOLOG FLEXPEN [place]) Inject 10 Units into the skin 2 (two) times daily before a meal.   . Insulin Glargine (LANTUS) 100 UNIT/ML Solostar Pen Inject 30 Units into the skin every morning.   Marland Kitchen ipratropium-albuterol (DUONEB) 0.5-2.5 (3) MG/3ML SOLN Take 3 mLs by nebulization every 4 (four) hours. (Patient taking differently: Take 3 mLs by nebulization every 4 (four) hours as needed. )  . LORazepam (ATIVAN) 1 MG tablet Take 1 mg by mouth 2 (two) times daily as needed. Anxiety/Sleep  . [DISCONTINUED] losartan (COZAAR) 100 MG tablet Take 100 mg by mouth daily.  . [DISCONTINUED] SYMBICORT 160-4.5 MCG/ACT inhaler USE 2 PUFFS 2 TIMES A DAY  . famotidine (PEPCID) 20 MG tablet One at bedtime  . montelukast (SINGULAIR) 10 MG tablet Take 1 tablet (10 mg total) by mouth at bedtime.  . predniSONE (DELTASONE) 10 MG tablet Take  4 each am x 2 days,   2 each am x 2 days,  1 each am x 2 days and stop  . valsartan (DIOVAN) 160 MG tablet Take 1 tablet (160 mg total) by mouth daily.  . [DISCONTINUED] polyethylene glycol-electrolytes (NULYTELY/GOLYTELY) 420 G solution Take 4,000 mLs by mouth once.   No facility-administered encounter medications on file as of 10/10/2015.

## 2015-10-11 LAB — ALLERGY FULL PROFILE
Bahia Grass: <0.1 kU/L
Box Elder IgE: <0.1 kU/L
Candida Albicans: <0.1 kU/L
Cat Dander: <0.1 kU/L
Curvularia lunata: 0.13 kU/L — ABNORMAL HIGH
Dog Dander: <0.1 kU/L
Elm IgE: <0.1 kU/L
Goldenrod: 0.1 kU/L — ABNORMAL HIGH
Helminthosporium halodes: <0.1 kU/L
IgE (Immunoglobulin E), Serum: 102 kU/L (ref <115)
Lamb's Quarters: <0.1 kU/L

## 2015-10-17 DIAGNOSIS — D126 Benign neoplasm of colon, unspecified: Secondary | ICD-10-CM | POA: Insufficient documentation

## 2015-10-17 NOTE — Progress Notes (Signed)
cc'ed to pcp °

## 2015-10-17 NOTE — Assessment & Plan Note (Signed)
Patient with tubular adenoma in 2011 with recommended 5 year surveillance colonoscopy. She will be due in the next month and a half. I offered to schedule this now for within the next 30 days to avoid her having to have a repeat office visit. She is agreeable to this.

## 2015-10-17 NOTE — Assessment & Plan Note (Signed)
History of irritable bowel with abdominal pain and loose stools. Her symptoms are improving although not completely back to normal. Is having 2 bowel movements a day which are softer although not liquid. Recommend she continue Bentyl which seems to be working well for her at improving her symptomatic presentation. She is due for a colonoscopy in a month and a half and offered to schedule this for her within the next 30 days to avoid having a rapid turn around repeat office visit. Return for follow-up in 6 months.

## 2015-10-24 ENCOUNTER — Ambulatory Visit (INDEPENDENT_AMBULATORY_CARE_PROVIDER_SITE_OTHER): Payer: BLUE CROSS/BLUE SHIELD | Admitting: Adult Health

## 2015-10-24 ENCOUNTER — Encounter: Payer: Self-pay | Admitting: Adult Health

## 2015-10-24 VITALS — BP 116/60 | HR 98 | Temp 98.7°F | Ht 59.0 in | Wt 230.0 lb

## 2015-10-24 DIAGNOSIS — I10 Essential (primary) hypertension: Secondary | ICD-10-CM | POA: Diagnosis not present

## 2015-10-24 DIAGNOSIS — J4540 Moderate persistent asthma, uncomplicated: Secondary | ICD-10-CM

## 2015-10-24 MED ORDER — MOMETASONE FURO-FORMOTEROL FUM 100-5 MCG/ACT IN AERO: 2.0000 | INHALATION_SPRAY | Freq: Two times a day (BID) | RESPIRATORY_TRACT | Status: DC

## 2015-10-24 NOTE — Progress Notes (Signed)
Subjective:     Patient ID: Ann Fuller, female   DOB: [birth year],    MRN: [id number]  HPI  68 yobf never smoker with tendency to reflux x 2011 then onset wheeze / cough labeled as asthma and worse x early 2016  always with better short pred / abx but only for only for a few week the cough starts again despite maint rx with symbicort  And followed by Dr Gretta Began referred her [place] 10/10/2015    10/10/2015   1st office visit/ [facility]/ transfer of care.   Chief Complaint  Patient presents with  . Follow-up    Pt states that her cough has been worse- producing some yellow sputum. She notices wheezing at night and is using neb 2 x per wk on average.   last visit was 08/08/15 no pna on cxr or CT and  rx with levaquin and predisone = > 100% better and maint symbicort / dexilant / cozar but gradaully worse x 2weeks Even when better doe x [employer] leaning on basket  >> Changed Symbicort to [facility], changed Cozaar to Diovan  10/24/2015 follow-up asthma  Patient returns for a two-week follow-up Patient was last seen 2 weeks ago with Dr. Melvyn Novas. She was having more cough and wheezing. She was changed over from Symbicort to [facility] and Cozaar to Diovan. Patient says she is feeling improved. She denies any hemoptysis, chest pain, orthopnea, PND or leg swelling. Flu shot is utd .      Current Medications, Allergies, Complete Past Medical History, Past Surgical History, Family History, and Social History were reviewed in [employer] record.  ROS  The following are not active complaints unless bolded sore throat, dysphagia, dental problems, itching, sneezing,  nasal congestion or excess/ purulent secretions, ear ache,   fever, chills, sweats, unintended wt loss, classically pleuritic or exertional cp, hemoptysis,  orthopnea pnd or leg swelling, presyncope, palpitations, abdominal pain, anorexia, nausea, vomiting, diarrhea  or change in bowel or bladder habits, change in  stools or urine, dysuria,hematuria,  rash, arthralgias, visual complaints, headache, numbness, weakness or ataxia or problems with walking or coordination,  change in mood/affect or memory.           Objective:   Physical Exam    amb obese bf   Vital signs reviewed  HEENT: nl dentition, turbinates, and orophanx. Nl external ear canals without cough reflex   NECK :  without JVD/Nodes/TM/ nl carotid upstrokes bilaterally   LUNGS: no acc muscle use, clear to A and P bilaterally without cough on insp or exp maneuvers   CV:  RRR  no s3 or murmur or increase in P2, no edema   ABD:  soft and nontender with nl excursion in the supine position. No bruits or organomegaly, bowel sounds nl  MS:  warm without deformities, calf tenderness, cyanosis or clubbing  SKIN: warm and dry without lesions    NEURO:  alert, approp, no deficits    CXR:   98/29/16 Chronic bronchitic changes but no infiltrates or effusions.  Labs ordered 10/10/2015 allergy profile     IgE 102 , rast min pos , eos ok on diff

## 2015-10-24 NOTE — Assessment & Plan Note (Signed)
Recent flare now improved  Improved control on Dulera and off cozaar   Plan  Continue on Dulera 2 puffs Twice daily  , rinse after use.  Follow up Dr. Melvyn Novas  In 2 months with PFT and As needed

## 2015-10-24 NOTE — Progress Notes (Signed)
Chart and office note reviewed in detail  > agree with a/p as outlined    

## 2015-10-24 NOTE — Assessment & Plan Note (Signed)
Cough improved off Cozaar.  Good control on Diovan .  Cont on current rx, follow up with PCP for b/p management /.

## 2015-10-24 NOTE — Addendum Note (Signed)
Addended by: Osa Craver on: 10/24/2015 02:23 PM   Modules accepted: Orders

## 2015-10-24 NOTE — Patient Instructions (Addendum)
Continue on Dulera 2 puffs Twice daily  , rinse after use.  Follow up Dr. Melvyn Novas  In 2 months with PFT and As needed

## 2015-10-28 ENCOUNTER — Ambulatory Visit: Admit: 2015-10-28 | Payer: Self-pay | Admitting: Internal Medicine

## 2015-10-28 ENCOUNTER — Encounter (HOSPITAL_COMMUNITY)
Admission: RE | Disposition: A | Discharge status: Home Health | Payer: Self-pay | Source: Ambulatory Visit | Attending: Internal Medicine

## 2015-10-28 ENCOUNTER — Encounter (HOSPITAL_COMMUNITY): Payer: Self-pay | Admitting: *Deleted

## 2015-10-28 ENCOUNTER — Ambulatory Visit (HOSPITAL_COMMUNITY)
Admission: RE | Admit: 2015-10-28 | Discharge: 2015-10-28 | Disposition: A | Discharge status: Home Health | Payer: BLUE CROSS/BLUE SHIELD | Source: Ambulatory Visit | Attending: Internal Medicine | Admitting: Internal Medicine

## 2015-10-28 DIAGNOSIS — K5730 Diverticulosis of large intestine without perforation or abscess without bleeding: Secondary | ICD-10-CM | POA: Diagnosis not present

## 2015-10-28 DIAGNOSIS — J45909 Unspecified asthma, uncomplicated: Secondary | ICD-10-CM | POA: Diagnosis not present

## 2015-10-28 DIAGNOSIS — D122 Benign neoplasm of ascending colon: Secondary | ICD-10-CM | POA: Diagnosis not present

## 2015-10-28 DIAGNOSIS — M1990 Unspecified osteoarthritis, unspecified site: Secondary | ICD-10-CM | POA: Diagnosis not present

## 2015-10-28 DIAGNOSIS — Z7951 Long term (current) use of inhaled steroids: Secondary | ICD-10-CM | POA: Diagnosis not present

## 2015-10-28 DIAGNOSIS — E119 Type 2 diabetes mellitus without complications: Secondary | ICD-10-CM | POA: Insufficient documentation

## 2015-10-28 DIAGNOSIS — Z79899 Other long term (current) drug therapy: Secondary | ICD-10-CM | POA: Diagnosis not present

## 2015-10-28 DIAGNOSIS — Z794 Long term (current) use of insulin: Secondary | ICD-10-CM | POA: Insufficient documentation

## 2015-10-28 DIAGNOSIS — Z801 Family history of malignant neoplasm of trachea, bronchus and lung: Secondary | ICD-10-CM | POA: Insufficient documentation

## 2015-10-28 DIAGNOSIS — I10 Essential (primary) hypertension: Secondary | ICD-10-CM | POA: Insufficient documentation

## 2015-10-28 DIAGNOSIS — Z86010 Personal history of colonic polyps: Secondary | ICD-10-CM

## 2015-10-28 DIAGNOSIS — Z7982 Long term (current) use of aspirin: Secondary | ICD-10-CM | POA: Diagnosis not present

## 2015-10-28 DIAGNOSIS — Z1211 Encounter for screening for malignant neoplasm of colon: Secondary | ICD-10-CM | POA: Insufficient documentation

## 2015-10-28 DIAGNOSIS — Z853 Personal history of malignant neoplasm of breast: Secondary | ICD-10-CM | POA: Insufficient documentation

## 2015-10-28 DIAGNOSIS — K589 Irritable bowel syndrome without diarrhea: Secondary | ICD-10-CM

## 2015-10-28 DIAGNOSIS — K219 Gastro-esophageal reflux disease without esophagitis: Secondary | ICD-10-CM | POA: Diagnosis not present

## 2015-10-28 HISTORY — PX: COLONOSCOPY: SHX5424

## 2015-10-28 LAB — GLUCOSE, CAPILLARY: GLUCOSE-CAPILLARY: 116 mg/dL — AB (ref 65–99)

## 2015-10-28 SURGERY — COLONOSCOPY
Anesthesia: Moderate Sedation

## 2015-10-28 MED ORDER — MEPERIDINE HCL 100 MG/ML IJ SOLN
INTRAMUSCULAR | Status: DC | PRN
  Administered 2015-10-28: 25 mg via INTRAVENOUS
  Administered 2015-10-28: 50 mg via INTRAVENOUS
  Administered 2015-10-28: 25 mg via INTRAVENOUS

## 2015-10-28 MED ORDER — SODIUM CHLORIDE 0.9 % IV SOLN
INTRAVENOUS | Status: DC
  Administered 2015-10-28: 08:00:00 via INTRAVENOUS

## 2015-10-28 MED ORDER — STERILE WATER FOR IRRIGATION IR SOLN
Status: DC | PRN
  Administered 2015-10-28: 09:00:00

## 2015-10-28 MED ORDER — MIDAZOLAM HCL 5 MG/5ML IJ SOLN
INTRAMUSCULAR | Status: DC | PRN
  Administered 2015-10-28 (×2): 1 mg via INTRAVENOUS
  Administered 2015-10-28: 2 mg via INTRAVENOUS

## 2015-10-28 MED ORDER — MIDAZOLAM HCL 5 MG/5ML IJ SOLN
INTRAMUSCULAR | Status: AC
  Filled 2015-10-28: qty 10

## 2015-10-28 MED ORDER — ONDANSETRON HCL 4 MG/2ML IJ SOLN
INTRAMUSCULAR | Status: AC
  Filled 2015-10-28: qty 2

## 2015-10-28 MED ORDER — MEPERIDINE HCL 100 MG/ML IJ SOLN
INTRAMUSCULAR | Status: AC
  Filled 2015-10-28: qty 2

## 2015-10-28 MED ORDER — ONDANSETRON HCL 4 MG/2ML IJ SOLN
INTRAMUSCULAR | Status: DC | PRN
  Administered 2015-10-28: 4 mg via INTRAVENOUS

## 2015-10-28 NOTE — Discharge Instructions (Addendum)
°Colonoscopy °Discharge Instructions ° °Read the instructions outlined below and refer to this sheet in the next few weeks. These discharge instructions provide you with general information on caring for yourself after you leave the hospital. Your doctor may also give you specific instructions. While your treatment has been planned according to the most current medical practices available, unavoidable complications occasionally occur. If you have any problems or questions after discharge, call Dr. [name] at [phone]. °ACTIVITY °· You may resume your regular activity, but move at a slower pace for the next 24 hours.  °· Take frequent rest periods for the next 24 hours.  °· Walking will help get rid of the air and reduce the bloated feeling in your belly (abdomen).  °· No driving for 24 hours (because of the medicine (anesthesia) used during the test).   °· Do not sign any important legal documents or operate any machinery for 24 hours (because of the anesthesia used during the test).  °NUTRITION °· Drink plenty of fluids.  °· You may resume your normal diet as instructed by your doctor.  °· Begin with a light meal and progress to your normal diet. Heavy or fried foods are harder to digest and may make you feel sick to your stomach (nauseated).  °· Avoid alcoholic beverages for 24 hours or as instructed.  °MEDICATIONS °· You may resume your normal medications unless your doctor tells you otherwise.  °WHAT YOU CAN EXPECT TODAY °· Some feelings of bloating in the abdomen.  °· Passage of more gas than usual.  °· Spotting of blood in your stool or on the toilet paper.  °IF YOU HAD POLYPS REMOVED DURING THE COLONOSCOPY: °· No aspirin products for 7 days or as instructed.  °· No alcohol for 7 days or as instructed.  °· Eat a soft diet for the next 24 hours.  °FINDING OUT THE RESULTS OF YOUR TEST °Not all test results are available during your visit. If your test results are not back during the visit, make an appointment  with your caregiver to find out the results. Do not assume everything is normal if you have not heard from your caregiver or the medical facility. It is important for you to follow up on all of your test results.  °SEEK IMMEDIATE MEDICAL ATTENTION IF: °· You have more than a spotting of blood in your stool.  °· Your belly is swollen (abdominal distention).  °· You are nauseated or vomiting.  °· You have a temperature over 101.  °· You have abdominal pain or discomfort that is severe or gets worse throughout the day.  ° ° °Colon polyp and diverticulosis information provided ° °Further recommendations to follow pending review of pathology report ° ° °Diverticulosis °Diverticulosis is the condition that develops when small pouches (diverticula) form in the wall of your colon. Your colon, or large intestine, is where water is absorbed and stool is formed. The pouches form when the inside layer of your colon pushes through weak spots in the outer layers of your colon. °CAUSES  °No one knows exactly what causes diverticulosis. °RISK FACTORS °· Being older than 50. Your risk for this condition increases with age. Diverticulosis is rare in people younger than 40 years. By age 80, almost everyone has it. °· Eating a low-fiber diet. °· Being frequently constipated. °· Being overweight. °· Not getting enough exercise. °· Smoking. °· Taking over-the-counter pain medicines, like aspirin and ibuprofen. °SYMPTOMS  °Most people with diverticulosis do not have symptoms. °DIAGNOSIS  °  Because diverticulosis often has no symptoms, health care providers often discover the condition during an exam for other colon problems. In many cases, a health care provider will diagnose diverticulosis while using a flexible scope to examine the colon (colonoscopy). °TREATMENT  °If you have never developed an infection related to diverticulosis, you may not need treatment. If you have had an infection before, treatment may include: °· Eating more  fruits, vegetables, and grains. °· Taking a fiber supplement. °· Taking a live bacteria supplement (probiotic). °· Taking medicine to relax your colon. °HOME CARE INSTRUCTIONS  °· Drink at least 6-8 glasses of water each day to prevent constipation. °· Try not to strain when you have a bowel movement. °· Keep all follow-up appointments. °If you have had an infection before:  °· Increase the fiber in your diet as directed by your health care provider or dietitian. °· Take a dietary fiber supplement if your health care provider approves. °· Only take medicines as directed by your health care provider. °SEEK MEDICAL CARE IF:  °· You have abdominal pain. °· You have bloating. °· You have cramps. °· You have not gone to the bathroom in 3 days. °SEEK IMMEDIATE MEDICAL CARE IF:  °· Your pain gets worse. °· Your bloating becomes very bad. °· You have a fever or chills, and your symptoms suddenly get worse. °· You begin vomiting. °· You have bowel movements that are bloody or black. °MAKE SURE YOU: °· Understand these instructions. °· Will watch your condition. °· Will get help right away if you are not doing well or get worse. °  °This information is not intended to replace advice given to you by your health care provider. Make sure you discuss any questions you have with your health care provider. °  °Document Released: 09/06/2004 Document Revised: 12/15/2013 Document Reviewed: 11/04/2013 °Elsevier Interactive Patient Education ©2016 [employer] °Colon Polyps °Polyps are lumps of extra tissue growing inside the body. Polyps can grow in the large intestine (colon). Most colon polyps are noncancerous (benign). However, some colon polyps can become cancerous over time. Polyps that are larger than a pea may be harmful. To be safe, caregivers remove and test all polyps. °CAUSES  °Polyps form when mutations in the genes cause your cells to grow and divide even though no more tissue is needed. °RISK FACTORS °There are a number  of risk factors that can increase your chances of getting colon polyps. They include: °· Being older than 50 years. °· Family history of colon polyps or colon cancer. °· Long-term colon diseases, such as colitis or Crohn disease. °· Being overweight. °· Smoking. °· Being inactive. °· Drinking too much alcohol. °SYMPTOMS  °Most small polyps do not cause symptoms. If symptoms are present, they may include: °· Blood in the stool. The stool may look dark red or black. °· Constipation or diarrhea that lasts longer than 1 week. °DIAGNOSIS °People often do not know they have polyps until their caregiver finds them during a regular checkup. Your caregiver can use 4 tests to check for polyps: °· Digital rectal exam. The caregiver wears gloves and feels inside the rectum. This test would find polyps only in the rectum. °· Barium enema. The caregiver puts a liquid called barium into your rectum before taking X-rays of your colon. Barium makes your colon look white. Polyps are dark, so they are easy to see in the X-ray pictures. °· Sigmoidoscopy. A thin, flexible tube (sigmoidoscope) is placed into your rectum. The sigmoidoscope has a   light and tiny camera in it. The caregiver uses the sigmoidoscope to look at the last third of your colon. °· Colonoscopy. This test is like sigmoidoscopy, but the caregiver looks at the entire colon. This is the most common method for finding and removing polyps. °TREATMENT  °Any polyps will be removed during a sigmoidoscopy or colonoscopy. The polyps are then tested for cancer. °PREVENTION  °To help lower your risk of getting more colon polyps: °· Eat plenty of fruits and vegetables. Avoid eating fatty foods. °· Do not smoke. °· Avoid drinking alcohol. °· Exercise every day. °· Lose weight if recommended by your caregiver. °· Eat plenty of calcium and folate. Foods that are rich in calcium include milk, cheese, and broccoli. Foods that are rich in folate include chickpeas, kidney beans, and  spinach. °HOME CARE INSTRUCTIONS °Keep all follow-up appointments as directed by your caregiver. You may need periodic exams to check for polyps. °SEEK MEDICAL CARE IF: °You notice bleeding during a bowel movement. °  °This information is not intended to replace advice given to you by your health care provider. Make sure you discuss any questions you have with your health care provider. °  °Document Released: 09/05/2004 Document Revised: 12/31/2014 Document Reviewed: 02/19/2012 °Elsevier Interactive Patient Education ©2016 [employer]vier Inc. ° °

## 2015-10-28 NOTE — Op Note (Signed)
[facility] [address] [place], [address]   COLONOSCOPY PROCEDURE REPORT  PATIENT: Ann Fuller, Ann Fuller  MR#: [id number] BIRTHDATE: 1946/12/14 , 67  yrs. old GENDER: female ENDOSCOPIST: R.  Garfield Cornea, MD FACP [facility] REFERRED BY:Roy Willey Blade, M.D. PROCEDURE DATE:  11-09-15 PROCEDURE:   Colonoscopy with biopsy INDICATIONS:Surveillance examination; history of colonic adenoma. MEDICATIONS: Versed 5 mg IV and Demerol 100 mg IV in divided doses. Zofran 4 mg IV. ASA CLASS:       Class II  CONSENT: The risks, benefits, alternatives and imponderables including but not limited to bleeding, perforation as well as the possibility of a missed lesion have been reviewed.  The potential for biopsy, lesion removal, etc. have also been discussed. Questions have been answered.  All parties agreeable.  Please see the history and physical in the medical record for more information.  DESCRIPTION OF PROCEDURE:   After the risks benefits and alternatives of the procedure were thoroughly explained, informed consent was obtained.  The digital rectal exam revealed no abnormalities of the rectum.   The EC-[id number] ([id number])  endoscope was introduced through the anus and advanced to the cecum, which was identified by both the appendix and ileocecal valve. No adverse events experienced.   The quality of the prep was adequate  The instrument was then slowly withdrawn as the colon was fully examined. Estimated blood loss is zero unless otherwise noted in this procedure report.      COLON FINDINGS: Normal-appearing rectal mucosa.  Scattered pancolonic diverticula; (3) diminutive polyps in the ascending segment; otherwise, the remainder of the colonic mucosa appeared normal.  The above-mentioned polyps were cold biopsy/removed.  Retroflexion was performed. .  Withdrawal time=10 minutes 0 seconds.  The scope was withdrawn and the procedure completed. COMPLICATIONS: There were no  immediate complications. EBL 3 mL ENDOSCOPIC IMPRESSION: Colonic diverticulosis. Colonic polyps?"removed as described above  RECOMMENDATIONS: Follow-up on pathology.  eSigned:  R. Garfield Cornea, MD Rosalita Chessman [facility] 11-09-15 9:17 AM   cc:  CPT CODES: ICD CODES:  The ICD and CPT codes recommended by this software are interpretations from the data that the clinical staff has captured with the software.  The verification of the translation of this report to the ICD and CPT codes and modifiers is the sole responsibility of the health care institution and practicing physician where this report was generated.  [employer] will not be held responsible for the validity of the ICD and CPT codes included on this report.  AMA assumes no liability for data contained or not contained herein. CPT is a Designer, television/film set of the [employer].

## 2015-10-28 NOTE — H&P (View-Only) (Signed)
Referring Provider: Asencion Noble, MD Primary Care Physician:  Asencion Noble, MD Primary GI:  Dr. Gala Romney  Chief Complaint  Patient presents with  . Abdominal Pain    HPI:   69 year old female presents for follow-up on abdominal pain. Last seen in our office 08/25/2014. Has a history of GERD, diverticulitis, IBS. At her last visit she was back to baseline. She has a flare of her abdominal pain typically when she is upset and/or eats certain foods including salads and nuts which other worst. Was recently seen in the emergency department 09/22/2015 for abdominal pain, ER notes reviewed. CT revealed diverticulosis without diverticulitis, common bile duct 11 mm mildly dilated larger than in 2011, no obstructing mass lesion seen, status post cholecystectomy. Labs normal in the ER including LFTs. Diagnosed with likely IBS flare.  Today she states she's had a rough year, but is feeling beter today. She was having diarrhea, fecal urgency, and abdominal pain. Symptoms improved but not quite back to baseline. Is taking Bentyl and Dexilant which helps. Is eating vegetables, fish, chicken, and bread which is tolerable for her. Cannot eat corn, salads, nuts. Her pain is left-sided. No pain today, just residual soreness. Was put on antibiotics by PCP which she feels helped, has finished the antibiotic course. At baseline has 2 bowel movements a day which are softer but not liquid. Seems to only be symptomatic with flares. This year has flares about once a month. She thinks she may have seen some blood in her stool once a few weeks ago, did recently have beets at that time. Denies any other hematochezia or melena. Denies fever, chills, N/V, unintentional weight loss. Denies chest pain, dyspnea, dizziness, lightheadedness, syncope, near syncope. Denies any other upper or lower GI symptoms.  She is due for a colonoscopy in 1.5 months, offered to schedule this now to avoid a repeat office visit.  Past Medical History    Diagnosis Date  . Essential hypertension, benign   . Type 2 diabetes mellitus ([place])   . Adenomatous colon polyp   . Diverticulitis   . Internal hemorrhoid   . IBS (irritable bowel syndrome)   . GERD (gastroesophageal reflux disease)   . Dyspepsia   . Hiatal hernia   . Tubular adenoma 11/2010  . Bulging lumbar disc   . PONV (postoperative nausea and vomiting)   . Asthma   . Arthritis   . Right shoulder pain   . Breast cancer [facility]) [id number]    Past Surgical History  Procedure Laterality Date  . Cholecystectomy    . Tubal ligation    . Esophagogastroduodenoscopy  12/13/2010    Dr. Jennet Maduro hernia, fundal gland type polyps  . Colonoscopy  12/13/2010    Dr. Margarito Courser pancolonic diverticular.tubular adenoma  . Colonoscopy  07/13/2003  . Appendectomy    . Mastectomy  1995  . Back surgery  2007    Lumbar fusion  . Tonsillectomy    . Abdominal hysterectomy    . Cataract extraction w/phaco Left 08/27/2013    Procedure: CATARACT EXTRACTION PHACO AND INTRAOCULAR LENS PLACEMENT (IOC);  Surgeon: Tonny Branch, MD;  Location: [facility];  Service: Ophthalmology;  Laterality: Left;  CDE 4.25  . Eye surgery Left     Left KPE 08/27/13  . Cataract extraction w/phaco Right 09/24/2013    Procedure: CATARACT EXTRACTION PHACO AND INTRAOCULAR LENS PLACEMENT (IOC);  Surgeon: Tonny Branch, MD;  Location: [facility];  Service: Ophthalmology;  Laterality: Right;  CDE:  8.16    Current Outpatient  Prescriptions  Medication Sig Dispense Refill  . albuterol (VENTOLIN HFA) 108 (90 BASE) MCG/ACT inhaler Inhale 2 puffs into the lungs every 6 (six) hours as needed. Shortness of Breath    . amLODipine (NORVASC) 5 MG tablet Take 5 mg by mouth at bedtime.     Marland Kitchen aspirin 81 MG tablet Take 81 mg by mouth at bedtime.     Marland Kitchen atorvastatin (LIPITOR) 40 MG tablet Take 40 mg by mouth daily.      . chlorthalidone (HYGROTON) 25 MG tablet Take 25 mg by mouth daily.      Marland Kitchen dexlansoprazole (DEXILANT) 60 MG capsule Take 60 mg  by mouth daily.     Marland Kitchen dicyclomine (BENTYL) 10 MG capsule TAKE ONE CAPSULE BY MOUTH PRIOR TO MEALS AS NEEDED NO MORE THAN FOUR TIMES DAILY. MAY CAUSE DROWSINESS 120 capsule 3  . exenatide (BYETTA 10 MCG PEN) 10 MCG/0.04ML SOLN Inject 10 mcg into the skin 2 (two) times daily with a meal.     . Insulin Aspart (NOVOLOG FLEXPEN [place]) Inject 10 Units into the skin 2 (two) times daily before a meal.     . Insulin Glargine (LANTUS) 100 UNIT/ML Solostar Pen Inject 30 Units into the skin every morning.     Marland Kitchen ipratropium-albuterol (DUONEB) 0.5-2.5 (3) MG/3ML SOLN Take 3 mLs by nebulization every 4 (four) hours. (Patient taking differently: Take 3 mLs by nebulization every 4 (four) hours as needed. ) 360 mL 6  . LORazepam (ATIVAN) 1 MG tablet Take 1 mg by mouth 2 (two) times daily as needed. Anxiety/Sleep    . losartan (COZAAR) 100 MG tablet Take 100 mg by mouth daily.    . SYMBICORT 160-4.5 MCG/ACT inhaler USE 2 PUFFS 2 TIMES A DAY 10.2 g 3   No current facility-administered medications for this visit.    Allergies as of 10/10/2015 - Review Complete 10/10/2015  Allergen Reaction Noted  . Ceftin [cefuroxime axetil] Swelling 05/02/2012  . Codeine Swelling   . Metformin and related Diarrhea 11/26/2012  . Nexium [esomeprazole magnesium] Diarrhea 05/02/2012  . Omnicef [cefdinir] Diarrhea 05/02/2012  . Sulfa antibiotics Rash 05/02/2012    Family History  Problem Relation Age of Onset  . Colon cancer Mother   . Heart attack Father   . Heart attack Sister   . Stroke Sister   . Cancer - Lung Sister     Social History   Social History  . Marital Status: Married    Spouse Name: N/A  . Number of Children: N/A  . Years of Education: N/A   Social History Main Topics  . Smoking status: Never Smoker   . Smokeless tobacco: Never Used  . Alcohol Use: No  . Drug Use: No  . Sexual Activity: Yes    Birth Control/ Protection: Surgical   Other Topics Concern  . None   Social History Narrative     Review of Systems: General: Negative for anorexia, weight loss, fever, chills, fatigue, weakness.  ENT: Negative for hoarseness, difficulty swallowing. CV: Negative for chest pain, angina, palpitations, peripheral edema.  Respiratory: Negative for dyspnea at rest, cough, sputum, wheezing.  GI: See history of present illness. Endo: Negative for unusual weight change.    Physical Exam: BP 140/82 mmHg  Pulse 94  Temp(Src) 97.3 F (36.3 C) (Oral)  Ht 4\' 11"  (1.499 m)  Wt 226 lb 6.4 oz (102.694 kg)  BMI 45.70 kg/m2 General:   Alert and oriented. Pleasant and cooperative. Well-nourished and well-developed.  Head:  Normocephalic and  atraumatic. Eyes:  Without icterus, sclera clear and conjunctiva pink.  Ears:  Normal auditory acuity. Cardiovascular:  S1, S2 present without murmurs appreciated. Extremities without clubbing or edema. Respiratory:  Clear to auscultation bilaterally. No wheezes, rales, or rhonchi. No distress.  Gastrointestinal:  +BS, soft, non-tender and non-distended. No HSM noted. No guarding or rebound. No masses appreciated.  Rectal:  Deferred  Neurologic:  Alert and oriented x4;  grossly normal neurologically. Psych:  Alert and cooperative. Normal mood and affect. Heme/Lymph/Immune: No excessive bruising noted.    10/10/2015 9:24 AM

## 2015-10-28 NOTE — Interval H&P Note (Signed)
History and Physical Interval Note:  10/28/2015 8:39 AM  Ann Fuller  has presented today for surgery, with the diagnosis of irritable bowel syndrome, history of colon adenoma  The various methods of treatment have been discussed with the patient and family. After consideration of risks, benefits and other options for treatment, the patient has consented to  Procedure(s) with comments: COLONOSCOPY (N/A) - 0845 as a surgical intervention .  The patient's history has been reviewed, patient examined, no change in status, stable for surgery.  I have reviewed the patient's chart and labs.  Questions were answered to the patient's satisfaction.     Robert [name]  No change. Surveillance colonoscopy per plan.The risks, benefits, limitations, alternatives and imponderables have been reviewed with the patient. Questions have been answered. All parties are agreeable.

## 2015-10-31 ENCOUNTER — Ambulatory Visit
Admission: RE | Admit: 2015-10-31 | Discharge: 2015-10-31 | Disposition: A | Discharge status: Home / Self Care | Payer: BLUE CROSS/BLUE SHIELD | Source: Ambulatory Visit

## 2015-10-31 ENCOUNTER — Encounter: Payer: Self-pay | Admitting: Internal Medicine

## 2015-10-31 DIAGNOSIS — Z1231 Encounter for screening mammogram for malignant neoplasm of breast: Secondary | ICD-10-CM

## 2015-11-02 ENCOUNTER — Encounter (HOSPITAL_COMMUNITY): Payer: Self-pay | Admitting: Internal Medicine

## 2015-11-12 ENCOUNTER — Other Ambulatory Visit: Payer: Self-pay | Admitting: Gastroenterology

## 2015-12-23 ENCOUNTER — Ambulatory Visit: Payer: BLUE CROSS/BLUE SHIELD | Admitting: Internal Medicine

## 2015-12-27 ENCOUNTER — Ambulatory Visit: Payer: BLUE CROSS/BLUE SHIELD | Admitting: Internal Medicine

## 2016-01-09 ENCOUNTER — Ambulatory Visit: Payer: BLUE CROSS/BLUE SHIELD | Admitting: Internal Medicine

## 2016-02-02 ENCOUNTER — Ambulatory Visit (INDEPENDENT_AMBULATORY_CARE_PROVIDER_SITE_OTHER): Payer: BLUE CROSS/BLUE SHIELD | Admitting: Internal Medicine

## 2016-02-02 ENCOUNTER — Encounter: Payer: Self-pay | Admitting: Internal Medicine

## 2016-02-02 VITALS — BP 144/66 | HR 90 | Ht 59.0 in | Wt 230.0 lb

## 2016-02-02 DIAGNOSIS — J4530 Mild persistent asthma, uncomplicated: Secondary | ICD-10-CM

## 2016-02-02 DIAGNOSIS — R05 Cough: Secondary | ICD-10-CM | POA: Diagnosis not present

## 2016-02-02 DIAGNOSIS — J4540 Moderate persistent asthma, uncomplicated: Secondary | ICD-10-CM | POA: Diagnosis not present

## 2016-02-02 DIAGNOSIS — I10 Essential (primary) hypertension: Secondary | ICD-10-CM

## 2016-02-02 DIAGNOSIS — R058 Other specified cough: Secondary | ICD-10-CM

## 2016-02-02 LAB — PULMONARY FUNCTION TEST
DL/VA % pred: 98 %
DL/VA: 4.02 ml/min/mmHg/L
DLCO UNC: 11.84 ml/min/mmHg
DLCO unc % pred: 67 %
FEF 25-75 Post: 1.75 L/sec
FEF 25-75 Pre: 1.71 L/sec
FEF2575-%Change-Post: 2 %
FEF2575-%Pred-Post: 121 %
FEF2575-%Pred-Pre: 118 %
FEV1-%CHANGE-POST: 1 %
FEV1-%PRED-PRE: 90 %
FEV1-%Pred-Post: 92 %
FEV1-POST: 1.35 L
FEV1-Pre: 1.33 L
FEV1FVC-%Change-Post: 2 %
FEV1FVC-%Pred-Pre: 107 %
FEV6-%Change-Post: -1 %
FEV6-%PRED-POST: 86 %
FEV6-%Pred-Pre: 87 %
FEV6-POST: 1.57 L
FEV6-PRE: 1.59 L
FEV6FVC-%CHANGE-POST: 0 %
FEV6FVC-%PRED-POST: 105 %
FEV6FVC-%PRED-PRE: 105 %
FVC-%Change-Post: 0 %
FVC-%PRED-POST: 82 %
FVC-%Pred-Pre: 83 %
FVC-Post: 1.57 L
FVC-Pre: 1.59 L
POST FEV6/FVC RATIO: 100 %
PRE FEV6/FVC RATIO: 100 %
Post FEV1/FVC ratio: 86 %
Pre FEV1/FVC ratio: 84 %
RV % PRED: 78 %
RV: 1.48 L
TLC % PRED: 70 %
TLC: 3.05 L

## 2016-02-02 MED ORDER — PREDNISONE 10 MG PO TABS: ORAL_TABLET | ORAL | Status: DC

## 2016-02-02 NOTE — Patient Instructions (Addendum)
GERD (REFLUX)  is an extremely common cause of respiratory symptoms just like yours , many times with no obvious heartburn at all.    It can be treated with medication, but also with lifestyle changes including elevation of the head of your bed (ideally with 6 inch  bed blocks),  Smoking cessation, avoidance of late meals, excessive alcohol, and avoid fatty foods, chocolate, peppermint, colas, red wine, and acidic juices such as orange juice.  NO MINT OR MENTHOL PRODUCTS SO NO COUGH DROPS  USE SUGARLESS CANDY INSTEAD (Jolley ranchers or Stover's or Life Savers) or even ice chips will also do - the key is to swallow to prevent all throat clearing. NO OIL BASED VITAMINS - use powdered substitutes.  Stop evening dulera x 2 weeks then stop the am dose, if symptoms recur then recur but work on technique  Prednisone 10 mg take  4 each am x 2 days,   2 each am x 2 days,  1 each am x 2 days and stop   Please schedule a follow up visit in 3 months but call sooner if needed When return bring your medications in 2 separate bags, the ones you take no matter(automatically)  what vs the as needed (only when you feel you need them) .

## 2016-02-02 NOTE — Progress Notes (Signed)
PFT done today. 

## 2016-02-02 NOTE — Progress Notes (Signed)
Subjective:     Patient ID: HAELYNN BECKLUND, female   DOB: 25-Jul-1946,    MRN: [id number]    Brief patient profile:  39 yobf never smoker with tendency to reflux x 2011 then onset wheeze / cough labeled as asthma and worse x early 2016  always with better short pred / abx but only for only for a few weeks then the cough starts again despite maint rx with symbicort  And followed by Dr Joya Gaskins who referred her Shrey Boike 10/10/2015 with no evidence of any airflow obst during flare of cough 02/02/2016.    History of Present Illness  10/10/2015   1st office visit/ Mirtie Bastyr/ transfer of care.   Chief Complaint  Patient presents with  . Follow-up    Pt states that her cough has been worse- producing some yellow sputum. She notices wheezing at night and is using neb 2 x per wk on average.   last visit was 08/08/15 no pna on cxr or CT and  rx with levaquin and predisone = > 100% better and maint symbicort / dexilant / cozar but gradaully worse x 2weeks Even when better doe x [employer] leaning on basket  >> Changed Symbicort to [facility], changed Cozaar to Diovan  10/24/2015 NP  follow-up asthma  Patient returns for a two-week follow-up Patient was last seen 2 weeks ago with Dr. Melvyn Novas. She was having more cough and wheezing. She was changed over from Symbicort to [facility] and Cozaar to Diovan. Patient says she is feeling improved. She denies any hemoptysis, chest pain, orthopnea, PND or leg swelling. Flu shot is utd . rec No change dulera     02/02/2016  f/u ov/Cherylin Waguespack re: uacs vs asthma on dulera 100 / singulair / on pepcid at bedtime and ? dexilant in am (did not recognize it from list)  Chief Complaint  Patient presents with  . Follow-up    Increased cough x 1 wk- prod with yellow sputum.  Breathing has been slightly worse since cough started back. She uses rescue inhaler 3 x per wk on average.   ok sitting still/ in general able to lie down on  2 pillows / doe x [employer] better with cart worse without it    Cough comes back with exp to temperature changes/ certain smells / fumes while cooking   No obvious day to day or daytime variability or assoc   or chest tightness, subjective wheeze or overt sinus or hb symptoms. No unusual exp hx or h/o childhood pna/ asthma or knowledge of premature birth.  Sleeping ok without nocturnal  or early am exacerbation  of respiratory  c/o's or need for noct saba. Also denies any obvious fluctuation of symptoms with weather or environmental changes or other aggravating or alleviating factors except as outlined above   Current Medications, Allergies, Complete Past Medical History, Past Surgical History, Family History, and Social History were reviewed in [employer] record.  ROS  The following are not active complaints unless bolded sore throat, dysphagia, dental problems, itching, sneezing,  nasal congestion or excess/ purulent secretions, ear ache,   fever, chills, sweats, unintended wt loss, classically pleuritic or exertional cp, hemoptysis,  orthopnea pnd or leg swelling, presyncope, palpitations, abdominal pain, anorexia, nausea, vomiting, diarrhea  or change in bowel or bladder habits, change in stools or urine, dysuria,hematuria,  rash, arthralgias, visual complaints, headache, numbness, weakness or ataxia or problems with walking or coordination,  change in mood/affect or memory.  Objective:   Physical Exam    amb obese bf  Wt Readings from Last 3 Encounters:  02/02/16 230 lb (104.327 kg)  10/28/15 230 lb (104.327 kg)  10/24/15 230 lb (104.327 kg)    Vital signs reviewed     HEENT: nl dentition, turbinates, and orophanx. Nl external ear canals without cough reflex   NECK :  without JVD/Nodes/TM/ nl carotid upstrokes bilaterally   LUNGS: no acc muscle use, clear to A and P bilaterally without cough on insp or exp maneuvers   CV:  RRR  no s3 or murmur or increase in P2, no edema   ABD:  soft and nontender with  nl excursion in the supine position. No bruits or organomegaly, bowel sounds nl  MS:  warm without deformities, calf tenderness, cyanosis or clubbing  SKIN: warm and dry without lesions    NEURO:  alert, approp, no deficits

## 2016-02-03 ENCOUNTER — Encounter: Payer: Self-pay | Admitting: Internal Medicine

## 2016-02-03 NOTE — Assessment & Plan Note (Signed)
D/c cozar ? Cough > diovan 10/10/2015   Doubt the change helped the cough but she's doing fine on diovan so no change rx needed

## 2016-02-03 NOTE — Assessment & Plan Note (Signed)
-   CT sinus 04/02/15 > No evidence of sinusitis  - singulair trial 10/10/2015 > pt thinks helped a little  - allergy profile 10/10/2015 >>  IgE 102 but no sign Pos Rast,  Eos 0.2   Hx/exam / pfts entirely c/w uacs >> asthma  Next step is see if can wean off dulera (see asthma) and max rx directed at gerd  I had an extended discussion with the patient reviewing all relevant studies completed to date and  lasting 15 to 20 minutes of a 25 minute visit  The standardized cough guidelines published in Chest by Lissa Morales in 2006 are still the best available and consist of a multiple step process (up to 12!) , not a single office visit,  and are intended  to address this problem logically,  with an alogrithm dependent on response to empiric treatment at  each progressive step  to determine a specific diagnosis with  minimal addtional testing needed. Therefore if adherence is an issue or can't be accurately verified,  it's very unlikely the standard evaluation and treatment will be successful here.    Furthermore, response to therapy (other than acute cough suppression, which should only be used short term with avoidance of narcotic containing cough syrups if possible), can be a gradual process for which the patient may perceive immediate benefit.  Unlike going to an eye doctor where the best perscription is almost always the first one and is immediately effective, this is almost never the case in the management of chronic cough syndromes. Therefore the patient needs to commit up front to consistently adhere to recommendations  for up to 6 weeks of therapy directed at the likely underlying problem(s) before the response can be reasonably evaluated.  Need to use a trust but verify approach here:   To keep things simple, I have asked the patient to first separate medicines that are perceived as maintenance, that is to be taken daily "no matter what", from those medicines that are taken on only on an as-needed  basis and I have given the patient examples of both, and then return to see our NP to generate a  detailed  medication calendar which should be followed until the next physician sees the patient and updates it.        Each maintenance medication was reviewed in detail including most importantly the difference between maintenance and prns and under what circumstances the prns are to be triggered using an action plan format that is not reflected in the computer generated alphabetically organized AVS.    Please see instructions for details which were reviewed in writing and the patient given a copy highlighting the part that I personally wrote and discussed at today's ov.

## 2016-02-03 NOTE — Assessment & Plan Note (Addendum)
07/06/2015 p extensive coaching HFA effectiveness =   50% s spacer  -10/10/2015    try change symb 160 to dulera 100 2bid  - CT chest 06/2015>>neg - PFT's wnl during flare of cough 02/02/2016   - The proper method of use, as well as anticipated side effects, of a metered-dose inhaler are discussed and demonstrated to the patient. Improved effectiveness after extensive coaching during this visit to a level of approximately 75 % from a baseline of 25 %  So I strongly doubt she was getting any benefit from hfa calling into question whether this is any asthma here at all    No evidence of asthma so see if flares off dulera and just use saba prn while on max gerd rx

## 2016-04-09 ENCOUNTER — Ambulatory Visit: Payer: BLUE CROSS/BLUE SHIELD | Admitting: Nurse Practitioner

## 2016-04-24 ENCOUNTER — Ambulatory Visit (INDEPENDENT_AMBULATORY_CARE_PROVIDER_SITE_OTHER): Payer: BLUE CROSS/BLUE SHIELD | Admitting: Pulmonary Disease

## 2016-04-24 ENCOUNTER — Encounter: Payer: Self-pay | Admitting: Pulmonary Disease

## 2016-04-24 DIAGNOSIS — J4530 Mild persistent asthma, uncomplicated: Secondary | ICD-10-CM

## 2016-04-24 DIAGNOSIS — R05 Cough: Secondary | ICD-10-CM

## 2016-04-24 DIAGNOSIS — R058 Other specified cough: Secondary | ICD-10-CM

## 2016-04-24 MED ORDER — PREDNISONE 10 MG PO TABS: 10.0000 mg | ORAL_TABLET | Freq: Every day | ORAL | Status: DC

## 2016-04-24 MED ORDER — PREDNISONE 20 MG PO TABS: 20.0000 mg | ORAL_TABLET | Freq: Every day | ORAL | Status: DC

## 2016-04-24 NOTE — Assessment & Plan Note (Signed)
Recent flare 2/2 pollen/weather change. Cont dulera 2 P BID > wean off as able. Was off x 2 mos until this weekend.

## 2016-04-24 NOTE — Assessment & Plan Note (Signed)
Dietary changes. Cont PPI.

## 2016-04-24 NOTE — Progress Notes (Signed)
   Subjective:    Patient ID: Ann Fuller, female    DOB: 1946/01/23, 70 y.o.   MRN: [id number]  HPI  Pt is being seen recently by MW. Has UACS and mild persistent asthma.   ROV 04/24/2016 Pt returns to the office as an urgent f/u. Since last seen, she weaned off dulera and she was OK but got sick over the weekend.  Cough, cold, congestion, SOB. Restarted dulera and alb neb. 70% better.    Review of Systems  Constitutional: Positive for fatigue. Negative for fever.  HENT: Positive for congestion.   Eyes: Negative.   Respiratory: Positive for cough and shortness of breath.   Cardiovascular: Negative.   Gastrointestinal: Negative.   Endocrine: Negative.   Genitourinary: Negative.   Musculoskeletal: Negative.   Skin: Negative.   Allergic/Immunologic: Negative.   Neurological: Negative.   Hematological: Negative.   Psychiatric/Behavioral: Negative.   All other systems reviewed and are negative.      Objective:   Physical Exam   Vitals:  Filed Vitals:   04/24/16 1429  BP: 142/62  Pulse: 88  Temp: 97.7 F (36.5 C)  TempSrc: Oral  Height: 4\' 11"  (1.499 m)  Weight: 236 lb (107.049 kg)  SpO2: 92%    Constitutional/General:  Pleasant, well-nourished, well-developed, not in any distress,  Comfortably seating.  Well kempt  Body mass index is 47.64 kg/(m^2). Wt Readings from Last 3 Encounters:  04/24/16 236 lb (107.049 kg)  02/02/16 230 lb (104.327 kg)  10/28/15 230 lb (104.327 kg)   HEENT: Pupils equal and reactive to light and accommodation. Anicteric sclerae. Normal nasal mucosa.   No oral  lesions,  mouth clear,  oropharynx clear, no postnasal drip. (-) Oral thrush. No dental caries.  Airway - Mallampati class III  Neck: No masses. Midline trachea. No JVD, (-) LAD. (-) bruits appreciated.  Respiratory/Chest: Grossly normal chest. (-) deformity. (-) Accessory muscle use.  Symmetric expansion. (-) Tenderness on palpation.  Resonant on percussion.    Diminished BS on both lower lung zones. (-) crackles, rhonchi. Occasional bibasilar wheezing (-) egophony  Cardiovascular: Regular rate and  rhythm, heart sounds normal, no murmur or gallops, no peripheral edema  Gastrointestinal:  Normal bowel sounds. Soft, non-tender. No hepatosplenomegaly.  (-) masses.   Musculoskeletal:  Normal muscle tone. Normal gait.   Extremities: Grossly normal. (-) clubbing, cyanosis.  (-) edema  Skin: (-) rash,lesions seen.   Neurological/Psychiatric : alert, oriented to time, place, person. Normal mood and affect           Assessment & Plan:  Upper airway cough syndrome Recent flare 2/2 pollen/allergies. Pred taper over 1 week. Cont cetirizine 10 mg/d.   Mild persistent asthma in adult without complication Recent flare 2/2 pollen/weather change. Cont dulera 2 P BID > wean off as able. Was off x 2 mos until this weekend.   Morbid obesity ([place]) Weight reduction  GERD (gastroesophageal reflux disease) Dietary changes. Cont PPI.    Return to clinic in 1 month with Dr. Tye Savoy. Shirl Harris, MD 04/24/2016, 2:52 PM [facility] Pager [phone] After 3 pm or if no answer, call [phone]

## 2016-04-24 NOTE — Patient Instructions (Addendum)
1. Start prednisone 20 mg/tab 1 tab daily for 7 days 2. Cont dulera as you are using. Wean off when better. 3. Cont albuterol neb every 4 hrs as need for shortness of breath.   Return to clinic in 1 month to see Dr. Melvyn Novas.

## 2016-04-24 NOTE — Assessment & Plan Note (Signed)
Weight reduction 

## 2016-04-24 NOTE — Assessment & Plan Note (Signed)
Recent flare 2/2 pollen/allergies. Pred taper over 1 week. Cont cetirizine 10 mg/d.

## 2016-04-26 ENCOUNTER — Ambulatory Visit (INDEPENDENT_AMBULATORY_CARE_PROVIDER_SITE_OTHER)
Admission: RE | Admit: 2016-04-26 | Discharge: 2016-04-26 | Disposition: A | Discharge status: Home / Self Care | Payer: BLUE CROSS/BLUE SHIELD | Source: Ambulatory Visit | Attending: Internal Medicine | Admitting: Internal Medicine

## 2016-04-26 ENCOUNTER — Encounter: Payer: Self-pay | Admitting: Internal Medicine

## 2016-04-26 ENCOUNTER — Telehealth: Payer: Self-pay | Admitting: Pulmonary Disease

## 2016-04-26 ENCOUNTER — Ambulatory Visit (INDEPENDENT_AMBULATORY_CARE_PROVIDER_SITE_OTHER): Payer: BLUE CROSS/BLUE SHIELD | Admitting: Internal Medicine

## 2016-04-26 VITALS — BP 114/60 | HR 89 | Temp 98.4°F | Ht 64.0 in | Wt 232.6 lb

## 2016-04-26 DIAGNOSIS — J189 Pneumonia, unspecified organism: Secondary | ICD-10-CM | POA: Diagnosis not present

## 2016-04-26 DIAGNOSIS — R05 Cough: Secondary | ICD-10-CM

## 2016-04-26 DIAGNOSIS — R058 Other specified cough: Secondary | ICD-10-CM

## 2016-04-26 MED ORDER — METHYLPREDNISOLONE ACETATE 80 MG/ML IJ SUSP
120.0000 mg | Freq: Once | INTRAMUSCULAR | Status: AC
  Administered 2016-04-26: 120 mg via INTRAMUSCULAR

## 2016-04-26 MED ORDER — HYDROCODONE-ACETAMINOPHEN 5-325 MG PO TABS: 1.0000 | ORAL_TABLET | ORAL | Status: DC | PRN

## 2016-04-26 NOTE — Progress Notes (Signed)
Subjective:     Patient ID: Ann Fuller, female   DOB: [birth year],    MRN: [id number]    Brief patient profile:  20 yobf never smoker with tendency to reflux x 2011 then onset wheeze / cough labeled as asthma and worse x early 2016  always with better short pred / abx but only for only for a few weeks then the cough starts again despite maint rx with symbicort  And followed by Dr Joya Gaskins who referred her Dazani Norby 10/10/2015 with no evidence of any airflow obst during flare of cough 02/02/2016.    History of Present Illness  10/10/2015   1st office visit/ Preet Perrier/ transfer of care.   Chief Complaint  Patient presents with  . Follow-up    Pt states that her cough has been worse- producing some yellow sputum. She notices wheezing at night and is using neb 2 x per wk on average.   last visit was 08/08/15 no pna on cxr or CT and  rx with levaquin and predisone = > 100% better and maint symbicort / dexilant / cozar but gradaully worse x 2weeks Even when better doe x [employer] leaning on basket  >> Changed Symbicort to [facility], changed Cozaar to Diovan  10/24/2015 NP  follow-up asthma  Patient returns for a two-week follow-up Patient was last seen 2 weeks ago with Dr. Melvyn Novas. She was having more cough and wheezing. She was changed over from Symbicort to [facility] and Cozaar to Diovan. Patient says she is feeling improved. She denies any hemoptysis, chest pain, orthopnea, PND or leg swelling. Flu shot is utd . rec No change dulera     02/02/2016  f/u ov/Kollins Fenter re: uacs vs asthma on dulera 100 / singulair / on pepcid at bedtime and ? dexilant in am (did not recognize it from list)  Chief Complaint  Patient presents with  . Follow-up    Increased cough x 1 wk- prod with yellow sputum.  Breathing has been slightly worse since cough started back. She uses rescue inhaler 3 x per wk on average.   ok sitting still/ in general able to lie down on  2 pillows / doe x [employer] better with cart worse without it   Cough comes back with exp to temperature changes/ certain smells / fumes while cooking  rec GERD diet  Stop evening dulera x 2 weeks then stop the am dose, if symptoms recur then recur but work on technique Prednisone 10 mg take  4 each am x 2 days,   2 each am x 2 days,  1 each am x 2 days and stop  Please schedule a follow up visit in 3 months but call sooner if needed When return bring your medications in 2 separate bags, the ones you take no matter(automatically)  what vs the as needed (only when you feel you need them)    04/24/16 [facility] eval for flare rec 1. Start prednisone 20 mg/tab 1 tab daily for 7 days 2. Cont dulera as you are using. Wean off when better. 3. Cont albuterol neb every 4 hrs as need for shortness of breath.     04/26/2016 acute extended ov/Alexah Kivett re: uacs vs asthma maint rx dex/pepcid Chief Complaint  Patient presents with  . Acute Visit    Increased SOB, left flank pain and cough since ov 04/24/16- producing some yellow sputum. She states she has been wheezing at night.   cough started first on 04/21/16 s antecedent symptoms and then  started dulera 100 04/22/16 though on "0" at ov/ L flank pain night prior to OV worse with coughing fits  avg use h1 may be twice a week   No obvious day to day or daytime variability or assoc   or chest tightness, subjective wheeze or overt sinus or hb symptoms. No unusual exp hx or h/o childhood pna/ asthma or knowledge of premature birth.  Sleeping ok without nocturnal  or early am exacerbation  of respiratory  c/o's or need for noct saba. Also denies any obvious fluctuation of symptoms with weather or environmental changes or other aggravating or alleviating factors except as outlined above   Current Medications, Allergies, Complete Past Medical History, Past Surgical History, Family History, and Social History were reviewed in [employer] record.  ROS  The following are not active complaints unless bolded sore  throat, dysphagia, dental problems, itching, sneezing,  nasal congestion or excess/ purulent secretions, ear ache,   fever, chills, sweats, unintended wt loss, classically pleuritic or exertional cp, hemoptysis,  orthopnea pnd or leg swelling, presyncope, palpitations, abdominal pain, anorexia, nausea, vomiting, diarrhea  or change in bowel or bladder habits, change in stools or urine, dysuria,hematuria,  rash, arthralgias, visual complaints, headache, numbness, weakness or ataxia or problems with walking or coordination,  change in mood/affect or memory.         Objective:   Physical Exam    amb obese bf with harsh upper airway sounding coughing fits    04/26/2016         238  02/02/16 230 lb (104.327 kg)  10/28/15 230 lb (104.327 kg)  10/24/15 230 lb (104.327 kg)    Vital signs reviewed     HEENT: nl dentition, turbinates, and orophanx. Nl external ear canals without cough reflex   NECK :  without JVD/Nodes/TM/ nl carotid upstrokes bilaterally   LUNGS: no acc muscle use, clear to A and P bilaterally without cough on insp or exp maneuvers   CV:  RRR  no s3 or murmur or increase in P2, no edema   ABD:  soft and nontender with nl excursion in the supine position. No bruits or organomegaly, bowel sounds nl  MS:  warm without deformities, calf tenderness, cyanosis or clubbing  SKIN: warm and dry without lesions    NEURO:  alert, approp, no deficits   CXR PA and Lateral:   04/26/2016 :    I personally reviewed images and agree with radiology impression as follows:   Normal cardiac and mediastinal contours. Focal heterogeneous opacities left lower lung. No pleural effusion or pneumothorax. Mid thoracic spine degenerative changes. Aortic vascular calcifications. Left chest wall surgical clips.

## 2016-04-26 NOTE — Patient Instructions (Addendum)
zpak Take prednisone 20 mg x 2 today then 1 daily until gone Depomedrol 120 IM   Take delsym two tsp every 12 hours and supplement if needed with Norco(vicodin) up to 2 every 4 hours to suppress the urge to cough. Swallowing water or using ice chips/non mint and menthol containing candies (such as lifesavers or sugarless jolly ranchers) are also effective.  You should rest your voice and avoid activities that you know make you cough.  Cough into the flutter valve to prevent trauma to airway from coughing  Once you have eliminated the cough for 3 straight days try reducing the Norco first,  then the delsym as tolerated.    GERD (REFLUX)  is an extremely common cause of respiratory symptoms just like yours , many times with no obvious heartburn at all.    It can be treated with medication, but also with lifestyle changes including elevation of the head of your bed (ideally with 6 inch  bed blocks),  Smoking cessation, avoidance of late meals, excessive alcohol, and avoid fatty foods, chocolate, peppermint, colas, red wine, and acidic juices such as orange juice.  NO MINT OR MENTHOL PRODUCTS SO NO COUGH DROPS  USE SUGARLESS CANDY INSTEAD (Jolley ranchers or Stover's or Life Savers) or even ice chips will also do - the key is to swallow to prevent all throat clearing. NO OIL BASED VITAMINS - use powdered substitutes.    For drainage / throat tickle try take CHLORPHENIRAMINE  4 mg - take one every 4 hours as needed - available over the counter- may cause drowsiness so start with just a bedtime dose or two and see how you tolerate it before trying in daytime     Keep your previous appt to see me 05/25/16  Late add needs cxr on return also ? Why stopped singulair?

## 2016-04-26 NOTE — Telephone Encounter (Signed)
Called and spoke with the pt  She was seen here by AD on 04/24/16  She reports since then her cough has become worse, and she is producing more sputum- yellow  I advised needs ov with CXR- come in today at 1:15  Pt aware to bring all meds

## 2016-04-27 ENCOUNTER — Telehealth: Payer: Self-pay | Admitting: Internal Medicine

## 2016-04-27 MED ORDER — AZITHROMYCIN 250 MG PO TABS: ORAL_TABLET | ORAL | Status: DC

## 2016-04-27 NOTE — Telephone Encounter (Signed)
Patient Instructions     zpak Take prednisone 20 mg x 2 tday then 1 daily until gone Depomedrol 120 IM     Spoke with pt and she states that Zpak was not at pharmacy. Per chart it does not look like it was sent. Rx sent to pharmacy. Pt aware. Nothing further needed.

## 2016-04-29 DIAGNOSIS — J189 Pneumonia, unspecified organism: Secondary | ICD-10-CM | POA: Insufficient documentation

## 2016-04-29 NOTE — Assessment & Plan Note (Signed)
Complicated by hbp/ hyperlipidemia/ aodm   Body mass index is 39.91   No results found for: TSH   Contributing to gerd tendency/ doe/reviewed the need and the process to achieve and maintain neg calorie balance > defer f/u primary care including intermittently monitoring thyroid status

## 2016-04-29 NOTE — Assessment & Plan Note (Signed)
Possible LLL pna  04/26/2016 > rx zpak, f/u cxr at [facility] 05/25/16 call sooner prn

## 2016-04-29 NOTE — Assessment & Plan Note (Signed)
-   CT sinus 04/02/15 > No evidence of sinusitis  - singulair trial 10/10/2015 > off it as of 04/26/2016  - allergy profile 10/10/2015 >>  IgE 102 but no sign Pos Rast,  Eos 0.2   Flare in setting of ? [place] with ? bronchopna LLL   rec Zpak/ taper off pred/ cyclical cough rx  I had an extended discussion with the patient reviewing all relevant studies completed to date and  lasting 15 to 20 minutes of a 25 minute visit    Each maintenance medication was reviewed in detail including most importantly the difference between maintenance and prns and under what circumstances the prns are to be triggered using an action plan format that is not reflected in the computer generated alphabetically organized AVS.    Please see instructions for details which were reviewed in writing and the patient given a copy highlighting the part that I personally wrote and discussed at today's ov.

## 2016-05-01 ENCOUNTER — Ambulatory Visit: Payer: BLUE CROSS/BLUE SHIELD | Admitting: Internal Medicine

## 2016-05-25 ENCOUNTER — Encounter: Payer: Self-pay | Admitting: Internal Medicine

## 2016-05-25 ENCOUNTER — Ambulatory Visit (INDEPENDENT_AMBULATORY_CARE_PROVIDER_SITE_OTHER)
Admission: RE | Admit: 2016-05-25 | Discharge: 2016-05-25 | Disposition: A | Discharge status: Home / Self Care | Payer: BLUE CROSS/BLUE SHIELD | Source: Ambulatory Visit | Attending: Internal Medicine | Admitting: Internal Medicine

## 2016-05-25 ENCOUNTER — Ambulatory Visit (INDEPENDENT_AMBULATORY_CARE_PROVIDER_SITE_OTHER): Payer: BLUE CROSS/BLUE SHIELD | Admitting: Internal Medicine

## 2016-05-25 VITALS — BP 130/68 | HR 91 | Ht 59.0 in | Wt 236.0 lb

## 2016-05-25 DIAGNOSIS — R058 Other specified cough: Secondary | ICD-10-CM

## 2016-05-25 DIAGNOSIS — J189 Pneumonia, unspecified organism: Secondary | ICD-10-CM | POA: Diagnosis not present

## 2016-05-25 DIAGNOSIS — R05 Cough: Secondary | ICD-10-CM | POA: Diagnosis not present

## 2016-05-25 DIAGNOSIS — J4530 Mild persistent asthma, uncomplicated: Secondary | ICD-10-CM | POA: Diagnosis not present

## 2016-05-25 NOTE — Patient Instructions (Addendum)
Please remember to go to the  x-ray department downstairs for your tests - we will call you with the results when they are available.  .See Tammy NP w/in 2 weeks (or next available)  with all your medications, even over the counter meds, separated in two separate bags, the ones you take no matter what vs the ones you stop once you feel better and take only as needed when you feel you need them.   Tammy  will generate for you a new user friendly medication calendar that will put [place] all on the same page re: your medication use.     Without this process, it simply isn't possible to assure that we are providing  your outpatient care  with  the attention to detail we feel you deserve.   If we cannot assure that you're getting that kind of care,  then we cannot manage your problem effectively from this clinic.  Once you have seen Tammy and we are sure that we're all on the same page with your medication use she will arrange follow up with me.

## 2016-05-25 NOTE — Progress Notes (Signed)
Subjective:     Patient ID: Ann Fuller, female   DOB: [birth year],    MRN: [id number]    Brief patient profile:  20 yobf never smoker with tendency to reflux x 2011 then onset wheeze / cough labeled as asthma and worse x early 2016  always with better short pred / abx but only for only for a few weeks then the cough starts again despite maint rx with symbicort  And followed by Dr Joya Gaskins who referred her Wert 10/10/2015 with no evidence of any airflow obst during flare of cough 02/02/2016.    History of Present Illness  10/10/2015   1st office visit/ Wert/ transfer of care.   Chief Complaint  Patient presents with  . Follow-up    Pt states that her cough has been worse- producing some yellow sputum. She notices wheezing at night and is using neb 2 x per wk on average.   last visit was 08/08/15 no pna on cxr or CT and  rx with levaquin and predisone = > 100% better and maint symbicort / dexilant / cozar but gradaully worse x 2weeks Even when better doe x [employer] leaning on basket  >> Changed Symbicort to [facility], changed Cozaar to Diovan  10/24/2015 NP  follow-up asthma  Patient returns for a two-week follow-up Patient was last seen 2 weeks ago with Dr. Melvyn Novas. She was having more cough and wheezing. She was changed over from Symbicort to [facility] and Cozaar to Diovan. Patient says she is feeling improved. She denies any hemoptysis, chest pain, orthopnea, PND or leg swelling. Flu shot is utd . rec No change dulera     02/02/2016  f/u ov/Wert re: uacs vs asthma on dulera 100 / singulair / on pepcid at bedtime and ? dexilant in am (did not recognize it from list)  Chief Complaint  Patient presents with  . Follow-up    Increased cough x 1 wk- prod with yellow sputum.  Breathing has been slightly worse since cough started back. She uses rescue inhaler 3 x per wk on average.   ok sitting still/ in general able to lie down on  2 pillows / doe x [employer] better with cart worse without it   Cough comes back with exp to temperature changes/ certain smells / fumes while cooking  rec GERD diet  Stop evening dulera x 2 weeks then stop the am dose, if symptoms recur then recur but work on technique Prednisone 10 mg take  4 each am x 2 days,   2 each am x 2 days,  1 each am x 2 days and stop  Please schedule a follow up visit in 3 months but call sooner if needed When return bring your medications in 2 separate bags, the ones you take no matter(automatically)  what vs the as needed (only when you feel you need them)    04/24/16 [facility] eval for flare rec 1. Start prednisone 20 mg/tab 1 tab daily for 7 days 2. Cont dulera as you are using. Wean off when better. 3. Cont albuterol neb every 4 hrs as need for shortness of breath.     04/26/2016 acute extended ov/Wert re: uacs vs asthma maint rx dex/pepcid Chief Complaint  Patient presents with  . Acute Visit    Increased SOB, left flank pain and cough since ov 04/24/16- producing some yellow sputum. She states she has been wheezing at night.   cough started first on 04/21/16 s antecedent symptoms and then  started dulera 100 04/22/16 though on "0" at ov/ L flank pain night prior to OV worse with coughing fits  avg use h1 may be twice a week  rec zpak Take prednisone 20 mg x 2 today then 1 daily until gone Depomedrol 120 IM  Take delsym two tsp every 12 hours and supplement if needed with Norco(vicodin) up to 2 every 4 hours to suppress the urge to cough. Swallowing water or using ice chips/non mint and menthol containing candies (such as lifesavers or sugarless jolly ranchers) are also effective.  You should rest your voice and avoid activities that you know make you cough. Cough into the flutter valve to prevent trauma to airway from coughing Once you have eliminated the cough for 3 straight days try reducing the Norco first,  then the delsym as tolerated.   GERD  Diet   For drainage / throat tickle try take CHLORPHENIRAMINE  4 mg - take  one every 4 hours as needed - available over the counter- may cause drowsiness so start with just a bedtime dose or two and see how you tolerate it before trying in daytime   Keep your previous appt to see me 05/25/16  Late add needs cxr on return also ? Why stopped singulair?      05/25/2016  f/u ov/Wert re: ? Cap vs arifact from breast implant in pt with   uacs  not needing saba  On dulera 200 2bid  Via spacer didn't bring Chief Complaint  Patient presents with  . Follow-up    Breathing is much improved. Her cough is also much better and she is no longer having left flank pain.   very easily confused with details of care, names of meds x 2 pages  No obvious day to day or daytime variability or assoc excess/ purulent sputum or mucus plugs    or chest tightness, subjective wheeze or overt sinus or hb symptoms. No unusual exp hx or h/o childhood pna/ asthma or knowledge of premature birth.  Sleeping ok without nocturnal  or early am exacerbation  of respiratory  c/o's or need for noct saba. Also denies any obvious fluctuation of symptoms with weather or environmental changes or other aggravating or alleviating factors except as outlined above   Current Medications, Allergies, Complete Past Medical History, Past Surgical History, Family History, and Social History were reviewed in [employer] record.  ROS  The following are not active complaints unless bolded sore throat, dysphagia, dental problems, itching, sneezing,  nasal congestion or excess/ purulent secretions, ear ache,   fever, chills, sweats, unintended wt loss, classically pleuritic or exertional cp, hemoptysis,  orthopnea pnd or leg swelling, presyncope, palpitations, abdominal pain, anorexia, nausea, vomiting, diarrhea  or change in bowel or bladder habits, change in stools or urine, dysuria,hematuria,  rash, arthralgias, visual complaints, headache, numbness, weakness or ataxia or problems with walking or  coordination,  change in mood/affect or memory.         Objective:   Physical Exam    amb obese bf  Nad/ all smiles   05/25/2016           236   04/26/2016         238  02/02/16 230 lb (104.327 kg)  10/28/15 230 lb (104.327 kg)  10/24/15 230 lb (104.327 kg)    Vital signs reviewed     HEENT: nl dentition, turbinates, and orophanx. Nl external ear canals without cough reflex   NECK :  without JVD/Nodes/TM/ nl carotid upstrokes bilaterally   LUNGS: no acc muscle use, clear to A and P bilaterally without cough on insp or exp maneuvers   CV:  RRR  no s3 or murmur or increase in P2, no edema   ABD:  soft and nontender with nl excursion in the supine position. No bruits or organomegaly, bowel sounds nl  MS:  warm without deformities, calf tenderness, cyanosis or clubbing  SKIN: warm and dry without lesions    NEURO:  alert, approp, no deficits   CXR PA and Lateral:   05/25/2016 :    I personally reviewed images and agree with radiology impression as follows:    Similar appearance to the previous chest radiograph. Markings appear more prominent in the left lower lobe compared to the right. Whereas this could represent patchy pneumonia in the left lower lobe, it may represent overlying density from the left-sided breast implant.

## 2016-05-26 NOTE — Assessment & Plan Note (Signed)
-   CT sinus 04/02/15 > No evidence of sinusitis  - singulair trial 10/10/2015 > off it as of 04/26/2016  - allergy profile 10/10/2015 >>  IgE 102 but no sign Pos Rast,  Eos 0.2  Adequate control on present rx, reviewed > no change in rx needed

## 2016-05-26 NOTE — Assessment & Plan Note (Signed)
07/06/2015 p extensive coaching HFA effectiveness =   50% s spacer  -10/10/2015    try change symb 160 to dulera 100 2bid  - CT chest 06/2015>>neg - PFT's wnl during flare of cough 02/02/2016 > try wean dulera - 02/02/2016  extensive coaching HFA effectiveness =    50% from a baseline of < 25%   - did not bring spacer 05/25/2016   All goals of chronic asthma control met including optimal function and elimination of symptoms with minimal need for rescue therapy.  Contingencies discussed in full including contacting this office immediately if not controlling the symptoms using the rule of two's.     Can consider lower dose of ICS next ov

## 2016-05-26 NOTE — Assessment & Plan Note (Signed)
Possible LLL pna  04/26/2016 > rx zpak, f/u cxr at ov 05/25/16 > no change c/w breast implant   No further abx/ cxr's planned

## 2016-05-26 NOTE — Assessment & Plan Note (Signed)
Complicated by hbp/ hyperlipidemia/ aodm   Body mass index is 47.64  Trending up so need to avoid any further pred here  No results found for: TSH   Contributing to gerd tendency/ doe/reviewed the need and the process to achieve and maintain neg calorie balance > defer f/u primary care including intermittently monitoring thyroid status

## 2016-05-28 NOTE — Progress Notes (Signed)
Quick Note:  Spoke with pt and notified of results per Dr. Wert. Pt verbalized understanding and denied any questions.  ______ 

## 2016-06-08 ENCOUNTER — Ambulatory Visit (INDEPENDENT_AMBULATORY_CARE_PROVIDER_SITE_OTHER): Payer: BLUE CROSS/BLUE SHIELD | Admitting: Adult Health

## 2016-06-08 ENCOUNTER — Encounter: Payer: Self-pay | Admitting: Adult Health

## 2016-06-08 VITALS — BP 126/74 | HR 86 | Temp 98.2°F | Ht 59.0 in | Wt 234.0 lb

## 2016-06-08 DIAGNOSIS — J4530 Mild persistent asthma, uncomplicated: Secondary | ICD-10-CM

## 2016-06-08 NOTE — Patient Instructions (Signed)
Continue on Dulera 2 puffs Twice daily  , rinse after use.  Follow med calendar closely and bring to each visit.  Follow up Dr. Melvyn Novas  In 2-3 months and As needed

## 2016-06-08 NOTE — Progress Notes (Signed)
Subjective:     Patient ID: Ann Fuller, female   DOB: 1946/03/12,    MRN: [id number]    Brief patient profile:  28 yobf never smoker with tendency to reflux x 2011 then onset wheeze / cough labeled as asthma and worse x early 2016  always with better short pred / abx but only for only for a few weeks then the cough starts again despite maint rx with symbicort  And followed by Dr Joya Gaskins who referred her [facility] 10/10/2015 with no evidence of any airflow obst during flare of cough 02/02/2016.    History of Present Illness  10/10/2015   1st office visit/ [facility]/ transfer of care.   Chief Complaint  Patient presents with  . Follow-up    Pt states that her cough has been worse- producing some yellow sputum. She notices wheezing at night and is using neb 2 x per wk on average.   last visit was 08/08/15 no pna on cxr or CT and  rx with levaquin and predisone = > 100% better and maint symbicort / dexilant / cozar but gradaully worse x 2weeks Even when better doe x [employer] leaning on basket  >> Changed Symbicort to [facility], changed Cozaar to Diovan  10/24/2015 NP  follow-up asthma  Patient returns for a two-week follow-up Patient was last seen 2 weeks ago with Dr. Melvyn Novas. She was having more cough and wheezing. She was changed over from Symbicort to [facility] and Cozaar to Diovan. Patient says she is feeling improved. She denies any hemoptysis, chest pain, orthopnea, PND or leg swelling. Flu shot is utd . rec No change dulera     02/02/2016  f/u ov/[facility] re: uacs vs asthma on dulera 100 / singulair / on pepcid at bedtime and ? dexilant in am (did not recognize it from list)  Chief Complaint  Patient presents with  . Follow-up    Increased cough x 1 wk- prod with yellow sputum.  Breathing has been slightly worse since cough started back. She uses rescue inhaler 3 x per wk on average.   ok sitting still/ in general able to lie down on  2 pillows / doe x [employer] better with cart worse without it   Cough comes back with exp to temperature changes/ certain smells / fumes while cooking  rec GERD diet  Stop evening dulera x 2 weeks then stop the am dose, if symptoms recur then recur but work on technique Prednisone 10 mg take  4 each am x 2 days,   2 each am x 2 days,  1 each am x 2 days and stop  Please schedule a follow up visit in 3 months but call sooner if needed When return bring your medications in 2 separate bags, the ones you take no matter(automatically)  what vs the as needed (only when you feel you need them)    04/24/16 [facility] eval for flare rec 1. Start prednisone 20 mg/tab 1 tab daily for 7 days 2. Cont dulera as you are using. Wean off when better. 3. Cont albuterol neb every 4 hrs as need for shortness of breath.     04/26/2016 acute extended ov/[facility] re: uacs vs asthma maint rx dex/pepcid Chief Complaint  Patient presents with  . Acute Visit    Increased SOB, left flank pain and cough since ov 04/24/16- producing some yellow sputum. She states she has been wheezing at night.   cough started first on 04/21/16 s antecedent symptoms and then  started dulera 100 04/22/16 though on "0" at ov/ L flank pain night prior to OV worse with coughing fits  avg use h1 may be twice a week  rec zpak Take prednisone 20 mg x 2 today then 1 daily until gone Depomedrol 120 IM  Take delsym two tsp every 12 hours and supplement if needed with Norco(vicodin) up to 2 every 4 hours to suppress the urge to cough. Swallowing water or using ice chips/non mint and menthol containing candies (such as lifesavers or sugarless jolly ranchers) are also effective.  You should rest your voice and avoid activities that you know make you cough. Cough into the flutter valve to prevent trauma to airway from coughing Once you have eliminated the cough for 3 straight days try reducing the Norco first,  then the delsym as tolerated.   GERD  Diet   For drainage / throat tickle try take CHLORPHENIRAMINE  4 mg - take  one every 4 hours as needed - available over the counter- may cause drowsiness so start with just a bedtime dose or two and see how you tolerate it before trying in daytime   Keep your previous appt to see me 05/25/16  Late add needs cxr on return also ? Why stopped singulair?      05/25/2016  f/u ov/Wert re: ? Cap vs arifact from breast implant in pt with   uacs  not needing saba  On dulera 200 2bid  Via spacer didn't bring Chief Complaint  Patient presents with  . Follow-up    Breathing is much improved. Her cough is also much better and she is no longer having left flank pain.   very easily confused with details of care, names of meds x 2 pages  >cxr   06/08/2016 Follow up : Med review .  Patient returns for a two-week follow-up Last visit. Patient is feeling okay, did have some dry cough  Last night .  We reviewed all her medications organize them into a medication calendar with patient education. She denies any chest pain, orthopnea, PND, or increased leg swelling Says PVX and Prevnar  is utd.    Current Medications, Allergies, Complete Past Medical History, Past Surgical History, Family History, and Social History were reviewed in [employer] record.  ROS  The following are not active complaints unless bolded sore throat, dysphagia, dental problems, itching, sneezing,  nasal congestion or excess/ purulent secretions, ear ache,   fever, chills, sweats, unintended wt loss, classically pleuritic or exertional cp, hemoptysis,  orthopnea pnd or leg swelling, presyncope, palpitations, abdominal pain, anorexia, nausea, vomiting, diarrhea  or change in bowel or bladder habits, change in stools or urine, dysuria,hematuria,  rash, arthralgias, visual complaints, headache, numbness, weakness or ataxia or problems with walking or coordination,  change in mood/affect or memory.         Objective:   Physical Exam    amb obese bf  Nad/ all smiles   05/25/2016           236   Filed Vitals:   06/08/16 1517  BP: 126/74  Pulse: 86  Temp: 98.2 F (36.8 C)  TempSrc: Oral  Height: 4\' 11"  (1.499 m)  Weight: 234 lb (106.142 kg)  SpO2: 97%   Vital signs reviewed     HEENT: nl dentition, turbinates, and orophanx. Nl external ear canals without cough reflex   NECK :  without JVD/Nodes/TM/ nl carotid upstrokes bilaterally   LUNGS: no acc muscle use, clear  to A and P bilaterally without cough on insp or exp maneuvers   CV:  RRR  no s3 or murmur or increase in P2, no edema   ABD:  soft and nontender with nl excursion in the supine position. No bruits or organomegaly, bowel sounds nl  MS:  warm without deformities, calf tenderness, cyanosis or clubbing  SKIN: warm and dry without lesions    NEURO:  alert, approp, no deficits   CXR PA and Lateral:   05/25/2016 :      Similar appearance to the previous chest radiograph. Markings appear more prominent in the left lower lobe compared to the right. Whereas this could represent patchy pneumonia in the left lower lobe, it may represent overlying density from the left-sided breast implant.    'Ilisha Blust NP-C  [facility]  06/08/2016

## 2016-06-08 NOTE — Assessment & Plan Note (Signed)
Compensated on present regimen   Plan  Continue on Dulera 2 puffs Twice daily  , rinse after use.  Follow med calendar closely and bring to each visit.  Follow up Dr. Melvyn Novas  In 2-3 months and As needed

## 2016-06-10 NOTE — Progress Notes (Signed)
Chart and office note reviewed in detail  > agree with a/p as outlined    

## 2016-06-14 NOTE — Addendum Note (Signed)
Addended by: Osa Craver on: 06/14/2016 12:10 PM   Modules accepted: Orders, Medications

## 2016-07-09 ENCOUNTER — Ambulatory Visit (INDEPENDENT_AMBULATORY_CARE_PROVIDER_SITE_OTHER): Payer: BLUE CROSS/BLUE SHIELD | Admitting: Otolaryngology

## 2016-07-09 DIAGNOSIS — R040 Epistaxis: Secondary | ICD-10-CM

## 2016-07-11 ENCOUNTER — Encounter (HOSPITAL_BASED_OUTPATIENT_CLINIC_OR_DEPARTMENT_OTHER): Payer: Self-pay | Admitting: *Deleted

## 2016-07-11 NOTE — Progress Notes (Signed)
Bring all medications. Going to [facility] [facility] or  Friday for  Bmet and Ekg. Request faxed to [facility].

## 2016-07-12 ENCOUNTER — Encounter (HOSPITAL_COMMUNITY)
Admission: RE | Admit: 2016-07-12 | Discharge: 2016-07-12 | Disposition: A | Discharge status: Home / Self Care | Payer: BLUE CROSS/BLUE SHIELD | Source: Ambulatory Visit | Attending: Otolaryngology | Admitting: Otolaryngology

## 2016-07-12 ENCOUNTER — Ambulatory Visit (INDEPENDENT_AMBULATORY_CARE_PROVIDER_SITE_OTHER): Payer: BLUE CROSS/BLUE SHIELD | Admitting: Otolaryngology

## 2016-07-12 DIAGNOSIS — R040 Epistaxis: Secondary | ICD-10-CM | POA: Diagnosis not present

## 2016-07-12 DIAGNOSIS — Z01812 Encounter for preprocedural laboratory examination: Secondary | ICD-10-CM | POA: Insufficient documentation

## 2016-07-12 DIAGNOSIS — Z01810 Encounter for preprocedural cardiovascular examination: Secondary | ICD-10-CM | POA: Insufficient documentation

## 2016-07-12 LAB — BASIC METABOLIC PANEL
Anion gap: 7 (ref 5–15)
BUN: 18 mg/dL (ref 6–20)
CALCIUM: 9 mg/dL (ref 8.9–10.3)
CO2: 27 mmol/L (ref 22–32)
CREATININE: 0.73 mg/dL (ref 0.44–1.00)
Chloride: 100 mmol/L — ABNORMAL LOW (ref 101–111)
GLUCOSE: 139 mg/dL — AB (ref 65–99)
Potassium: 3.7 mmol/L (ref 3.5–5.1)
Sodium: 134 mmol/L — ABNORMAL LOW (ref 135–145)

## 2016-07-13 ENCOUNTER — Other Ambulatory Visit: Payer: Self-pay | Admitting: Otolaryngology

## 2016-07-13 ENCOUNTER — Encounter (HOSPITAL_COMMUNITY): Payer: BLUE CROSS/BLUE SHIELD

## 2016-07-16 ENCOUNTER — Ambulatory Visit (HOSPITAL_BASED_OUTPATIENT_CLINIC_OR_DEPARTMENT_OTHER)
Admission: RE | Admit: 2016-07-16 | Discharge: 2016-07-16 | Disposition: A | Discharge status: Home / Self Care | Payer: BLUE CROSS/BLUE SHIELD | Source: Ambulatory Visit | Attending: Otolaryngology | Admitting: Otolaryngology

## 2016-07-16 ENCOUNTER — Ambulatory Visit (HOSPITAL_BASED_OUTPATIENT_CLINIC_OR_DEPARTMENT_OTHER): Payer: BLUE CROSS/BLUE SHIELD | Admitting: Anesthesiology

## 2016-07-16 ENCOUNTER — Encounter (HOSPITAL_BASED_OUTPATIENT_CLINIC_OR_DEPARTMENT_OTHER): Payer: Self-pay | Admitting: *Deleted

## 2016-07-16 ENCOUNTER — Encounter (HOSPITAL_BASED_OUTPATIENT_CLINIC_OR_DEPARTMENT_OTHER)
Admission: RE | Disposition: A | Discharge status: Home / Self Care | Payer: Self-pay | Source: Ambulatory Visit | Attending: Otolaryngology

## 2016-07-16 DIAGNOSIS — J45909 Unspecified asthma, uncomplicated: Secondary | ICD-10-CM | POA: Diagnosis not present

## 2016-07-16 DIAGNOSIS — Z794 Long term (current) use of insulin: Secondary | ICD-10-CM | POA: Diagnosis not present

## 2016-07-16 DIAGNOSIS — R040 Epistaxis: Secondary | ICD-10-CM | POA: Insufficient documentation

## 2016-07-16 DIAGNOSIS — E119 Type 2 diabetes mellitus without complications: Secondary | ICD-10-CM | POA: Insufficient documentation

## 2016-07-16 DIAGNOSIS — Z6841 Body Mass Index (BMI) 40.0 and over, adult: Secondary | ICD-10-CM | POA: Insufficient documentation

## 2016-07-16 HISTORY — PX: NASAL ENDOSCOPY WITH EPISTAXIS CONTROL: SHX5664

## 2016-07-16 HISTORY — DX: Major depressive disorder, single episode, unspecified: F32.9

## 2016-07-16 HISTORY — DX: Cardiac murmur, unspecified: R01.1

## 2016-07-16 HISTORY — DX: Depression, unspecified: F32.A

## 2016-07-16 LAB — GLUCOSE, CAPILLARY: Glucose-Capillary: 85 mg/dL (ref 65–99)

## 2016-07-16 SURGERY — CONTROL OF EPISTAXIS, ENDOSCOPIC
Anesthesia: General | Site: Nose | Laterality: Left

## 2016-07-16 MED ORDER — GLYCOPYRROLATE 0.2 MG/ML IJ SOLN: 0.2000 mg | Freq: Once | INTRAMUSCULAR | Status: DC | PRN

## 2016-07-16 MED ORDER — DEXAMETHASONE SODIUM PHOSPHATE 4 MG/ML IJ SOLN
INTRAMUSCULAR | Status: DC | PRN
  Administered 2016-07-16: 10 mg via INTRAVENOUS

## 2016-07-16 MED ORDER — OXYCODONE HCL 5 MG PO TABS: 5.0000 mg | ORAL_TABLET | Freq: Once | ORAL | Status: DC | PRN

## 2016-07-16 MED ORDER — HYDROMORPHONE HCL 1 MG/ML IJ SOLN: 0.2500 mg | INTRAMUSCULAR | Status: DC | PRN

## 2016-07-16 MED ORDER — FENTANYL CITRATE (PF) 100 MCG/2ML IJ SOLN
50.0000 ug | INTRAMUSCULAR | Status: DC | PRN
  Administered 2016-07-16: 50 ug via INTRAVENOUS

## 2016-07-16 MED ORDER — MIDAZOLAM HCL 2 MG/2ML IJ SOLN
INTRAMUSCULAR | Status: AC
  Filled 2016-07-16: qty 2

## 2016-07-16 MED ORDER — MUPIROCIN 2 % EX OINT
TOPICAL_OINTMENT | CUTANEOUS | Status: AC
  Filled 2016-07-16: qty 22

## 2016-07-16 MED ORDER — LACTATED RINGERS IV SOLN
INTRAVENOUS | Status: DC
  Administered 2016-07-16: 11:00:00 via INTRAVENOUS

## 2016-07-16 MED ORDER — FENTANYL CITRATE (PF) 100 MCG/2ML IJ SOLN
INTRAMUSCULAR | Status: AC
  Filled 2016-07-16: qty 2

## 2016-07-16 MED ORDER — DEXAMETHASONE SODIUM PHOSPHATE 10 MG/ML IJ SOLN
INTRAMUSCULAR | Status: AC
  Filled 2016-07-16: qty 1

## 2016-07-16 MED ORDER — OXYCODONE HCL 5 MG/5ML PO SOLN: 5.0000 mg | Freq: Once | ORAL | Status: DC | PRN

## 2016-07-16 MED ORDER — MIDAZOLAM HCL 2 MG/2ML IJ SOLN
1.0000 mg | INTRAMUSCULAR | Status: DC | PRN
  Administered 2016-07-16: 1 mg via INTRAVENOUS

## 2016-07-16 MED ORDER — ONDANSETRON HCL 4 MG/2ML IJ SOLN
INTRAMUSCULAR | Status: DC | PRN
  Administered 2016-07-16: 4 mg via INTRAVENOUS

## 2016-07-16 MED ORDER — SCOPOLAMINE 1 MG/3DAYS TD PT72
MEDICATED_PATCH | TRANSDERMAL | Status: AC
  Filled 2016-07-16: qty 1

## 2016-07-16 MED ORDER — SCOPOLAMINE 1 MG/3DAYS TD PT72
1.0000 | MEDICATED_PATCH | Freq: Once | TRANSDERMAL | Status: DC | PRN
Start: 2016-07-16 — End: 2016-07-16
  Administered 2016-07-16: 1.5 mg via TRANSDERMAL

## 2016-07-16 MED ORDER — MUPIROCIN 2 % EX OINT
TOPICAL_OINTMENT | CUTANEOUS | Status: DC | PRN
  Administered 2016-07-16: 1 via TOPICAL

## 2016-07-16 MED ORDER — PROPOFOL 10 MG/ML IV BOLUS
INTRAVENOUS | Status: DC | PRN
  Administered 2016-07-16: 150 mg via INTRAVENOUS

## 2016-07-16 MED ORDER — OXYMETAZOLINE HCL 0.05 % NA SOLN
NASAL | Status: DC | PRN
  Administered 2016-07-16: 1 via TOPICAL

## 2016-07-16 MED ORDER — LIDOCAINE 2% (20 MG/ML) 5 ML SYRINGE
INTRAMUSCULAR | Status: AC
  Filled 2016-07-16: qty 5

## 2016-07-16 MED ORDER — LIDOCAINE 2% (20 MG/ML) 5 ML SYRINGE
INTRAMUSCULAR | Status: DC | PRN
  Administered 2016-07-16: 100 mg via INTRAVENOUS

## 2016-07-16 MED ORDER — PROPOFOL 10 MG/ML IV BOLUS
INTRAVENOUS | Status: AC
  Filled 2016-07-16: qty 20

## 2016-07-16 MED ORDER — MEPERIDINE HCL 25 MG/ML IJ SOLN: 6.2500 mg | INTRAMUSCULAR | Status: DC | PRN

## 2016-07-16 MED ORDER — ONDANSETRON HCL 4 MG/2ML IJ SOLN
INTRAMUSCULAR | Status: AC
  Filled 2016-07-16: qty 2

## 2016-07-16 SURGICAL SUPPLY — 25 items
APPLICATOR COTTON TIP 6IN STRL (MISCELLANEOUS) ×6 IMPLANT
CANISTER SUCT 1200ML W/VALVE (MISCELLANEOUS) ×3 IMPLANT
COAGULATOR SUCT 8FR VV (MISCELLANEOUS) ×3 IMPLANT
CONT SPEC 4OZ CLIKSEAL STRL BL (MISCELLANEOUS) ×4 IMPLANT
DECANTER SPIKE VIAL GLASS SM (MISCELLANEOUS) IMPLANT
DEPRESSOR TONGUE BLADE STERILE (MISCELLANEOUS) IMPLANT
DRSG TELFA 3X8 NADH (GAUZE/BANDAGES/DRESSINGS) IMPLANT
ELECT REM PT RETURN 9FT ADLT (ELECTROSURGICAL) ×3
ELECT REM PT RETURN 9FT PED (ELECTROSURGICAL)
ELECTRODE REM PT RETRN 9FT PED (ELECTROSURGICAL) IMPLANT
ELECTRODE REM PT RTRN 9FT ADLT (ELECTROSURGICAL) IMPLANT
GAUZE SPONGE 4X4 12PLY STRL (GAUZE/BANDAGES/DRESSINGS) IMPLANT
GLOVE BIO SURGEON STRL SZ7.5 (GLOVE) ×1 IMPLANT
GLOVE SURG SS PI 7.0 STRL IVOR (GLOVE) ×2 IMPLANT
MARKER SKIN DUAL TIP RULER LAB (MISCELLANEOUS) IMPLANT
PACKING NASAL EPIS 4X2.4 XEROG (MISCELLANEOUS) IMPLANT
PAD DRESSING TELFA 3X8 NADH (GAUZE/BANDAGES/DRESSINGS) IMPLANT
SHEET MEDIUM DRAPE 40X70 STRL (DRAPES) ×3 IMPLANT
SOLUTION BUTLER CLEAR DIP (MISCELLANEOUS) ×3 IMPLANT
SPONGE GAUZE 2X2 8PLY STER LF (GAUZE/BANDAGES/DRESSINGS)
SPONGE GAUZE 2X2 8PLY STRL LF (GAUZE/BANDAGES/DRESSINGS) IMPLANT
SPONGE NEURO XRAY DETECT 1X3 (DISPOSABLE) ×3 IMPLANT
TOWEL OR 17X24 6PK STRL BLUE (TOWEL DISPOSABLE) ×3 IMPLANT
TUBE CONNECTING 20'X1/4 (TUBING) ×1
TUBE CONNECTING 20X1/4 (TUBING) ×2 IMPLANT

## 2016-07-16 NOTE — Anesthesia Postprocedure Evaluation (Signed)
Anesthesia Post Note  Patient: Ann Fuller  Procedure(s) Performed: Procedure(s) (LRB): ENDOSCOPIC LEFT NASAL CAUTERY (Left)  Patient location during evaluation: PACU Anesthesia Type: General Level of consciousness: awake and alert Pain management: pain level controlled Vital Signs Assessment: post-procedure vital signs reviewed and stable Respiratory status: spontaneous breathing, nonlabored ventilation and respiratory function stable Cardiovascular status: blood pressure returned to baseline and stable Postop Assessment: no signs of nausea or vomiting Anesthetic complications: no    Last Vitals:  Vitals:   07/16/16 1245 07/16/16 1409  BP:  (!) 147/63  Pulse: 71 93  Resp: (!) 22   Temp:  36.4 C    Last Pain:  Vitals:   07/16/16 1409  TempSrc: Oral  PainSc:                  Ginna Schuur A

## 2016-07-16 NOTE — H&P (Signed)
Cc: Recurrent left epistaxis  HPI: The patient is a 70 year old female who returns today complaining of more recurrent left epistaxis. The patient was last seen 4 weeks ago.  At that time, she was noted to have a severely hypervascular area at her left superior nasal septum. Profuse bleeding was noted at that time. She underwent extensive cauterization of the left superior nasal septum. According to the patient, she has been experiencing more recurrent severe bleeding. She has had 3 episodes of significant left sided nose bleeds over the past 2 weeks.  Her last epistaxis was 1 day ago.  The patient denies any recent nasal or facial trauma. She is taking a baby aspirin a day.   Exam: The left nasal cavity is sprayed with topical xylocaine and neo-synephrine.  After adequate anesthesia is achieved, the nasal cavity is examined with a 0 rigid endoscope.  A suction catheter is inserted in parallel with the 0 endoscope, and it is used to suction the blood clots from the left nasal cavity.  A hypervascular area is again noted at the left superior nasal septum.  Active and profuse bleeding is noted when the area is touched.  A silver nitrate stick is inserted in parallel with the 0 endoscope.  It is used to repeatedly cauterized the hypervascular area.  A packing is also placed in the left nasal cavity to control the bleeding.  The patient tolerated the procedure well.  Assessment:  A hypervascular area is again noted at the left superior nasal septum.  Active and profuse bleeding is noted when the area is touched.   Plan: 1.  Endoscopic cauterization of the left superior nasal septum.  Multiple passes are made today.   2.  A packing is also placed in the left nasal cavity to control the bleeding.  3.  If she experiences more recurrent bleeding, she may need to undergo electrocauterization of her left nasal septum under general anesthesia.

## 2016-07-16 NOTE — Transfer of Care (Signed)
Immediate Anesthesia Transfer of Care Note  Patient: Ann Fuller  Procedure(s) Performed: Procedure(s): ENDOSCOPIC LEFT NASAL CAUTERY (Left)  Patient Location: PACU  Anesthesia Type:General  Level of Consciousness: awake, sedated and patient cooperative  Airway & Oxygen Therapy: Patient Spontanous Breathing and Patient connected to face mask oxygen  Post-op Assessment: Report given to RN and Post -op Vital signs reviewed and stable  Post vital signs: Reviewed and stable  Last Vitals:  Vitals:   07/16/16 1031  BP: (!) 134/54  Pulse: 81  Resp: 20  Temp: 37 C    Last Pain:  Vitals:   07/16/16 1031  TempSrc: Oral         Complications: No apparent anesthesia complications

## 2016-07-16 NOTE — Op Note (Signed)
DATE OF PROCEDURE: 07/16/2016  OPERATIVE REPORT   SURGEON: Leta Baptist, MD  PREOPERATIVE DIAGNOSIS: Recurrent left epistaxis  POSTOPERATIVE DIAGNOSIS: Recurrent left epistaxis  PROCEDURES PERFORMED: 1. Endoscopic cauterization of the left nasal cavity to control the recurrent hemorrhage  ANESTHESIA: General laryngeal mask anesthesia.  COMPLICATIONS: None.  ESTIMATED BLOOD LOSS: Minimal.  INDICATION FOR PROCEDURE:  Ann Fuller is a 70 y.o. female with a history of frequent recurrent left epistaxis. She previously underwent multiple cauterization of her left superior nasal septum with silver nitrate. Despite her treatment, she continues to have recurrent bleeding.  Based on the above findings, the decision was made for the patient to undergo the above-stated procedures. The risks, benefits, alternatives, and details of the procedures were discussed with the patient. Questions were invited and answered. Informed consent was obtained.  DESCRIPTION OF PROCEDURE: The patient was taken to the operating room and placed supine on the operating table. General laryngeal mask anesthesia was induced by the anesthesiologist.  The patient was positioned and prepped and draped in a standard fashion for nasal surgery. Pledgets soaked with Afrin were placed in the left nasal cavity for vasoconstriction. The pledgets were subsequently removed. Using a 0 endoscope, the left nasal cavity was examined. A moderate amount of crusting was noted over the superior left nasal septum. The crusting was carefully removed. A hypovascular area is noted at the left superior nasal septum. Using the 0 endoscope, the hypervascular area is extensively cauterized with an electrocautery device. Hemostasis is achieved. Antibiotic ointment is applied to the left nasal septum.   That concluded the procedure for the patient. The care of the patient was turned over to the anesthesiologist. The patient was  awakened from anesthesia without difficulty. She was extubated and transferred to the recovery room in good condition.  OPERATIVE FINDINGS: A hypervascular area is noted at the left superior nasal septum.  SPECIMEN: None.  FOLLOWUP CARE: The patient will be discharged home once she is awake and alert. She will follow up in my office in approximately 2 weeks.

## 2016-07-16 NOTE — Anesthesia Preprocedure Evaluation (Signed)
Anesthesia Evaluation  Patient identified by MRN, date of birth, ID band Patient awake    Reviewed: Allergy & Precautions, NPO status , Patient's Chart, lab work & pertinent test results  History of Anesthesia Complications (+) PONV  Airway Mallampati: II  TM Distance: >3 FB Neck ROM: Full    Dental  (+) Teeth Intact, Dental Advisory Given   Pulmonary asthma ,    breath sounds clear to auscultation       Cardiovascular + Valvular Problems/Murmurs AS  Rhythm:Regular Rate:Normal     Neuro/Psych    GI/Hepatic   Endo/Other  diabetes, Well Controlled, Type 2, Insulin DependentMorbid obesity  Renal/GU      Musculoskeletal   Abdominal   Peds  Hematology   Anesthesia Other Findings   Reproductive/Obstetrics                             Anesthesia Physical Anesthesia Plan  ASA: III  Anesthesia Plan: General   Post-op Pain Management:    Induction: Intravenous  Airway Management Planned: LMA  Additional Equipment:   Intra-op Plan:   Post-operative Plan: Extubation in OR  Informed Consent: I have reviewed the patients History and Physical, chart, labs and discussed the procedure including the risks, benefits and alternatives for the proposed anesthesia with the patient or authorized representative who has indicated his/her understanding and acceptance.   Dental advisory given  Plan Discussed with: CRNA, Anesthesiologist and Surgeon  Anesthesia Plan Comments:         Anesthesia Quick Evaluation

## 2016-07-16 NOTE — Discharge Instructions (Addendum)
The patient may resume all her previous activities and diet. She'll follow-up in my [place] office as scheduled.    Post Anesthesia Home Care Instructions  Activity: Get plenty of rest for the remainder of the day. A responsible adult should stay with you for 24 hours following the procedure.  For the next 24 hours, DO NOT: -Drive a car -Paediatric nurse -Drink alcoholic beverages -Take any medication unless instructed by your physician -Make any legal decisions or sign important papers.  Meals: Start with liquid foods such as gelatin or soup. Progress to regular foods as tolerated. Avoid greasy, spicy, heavy foods. If nausea and/or vomiting occur, drink only clear liquids until the nausea and/or vomiting subsides. Call your physician if vomiting continues.  Special Instructions/Symptoms: Your throat may feel dry or sore from the anesthesia or the breathing tube placed in your throat during surgery. If this causes discomfort, gargle with warm salt water. The discomfort should disappear within 24 hours.  If you had a scopolamine patch placed behind your ear for the management of post- operative nausea and/or vomiting:  1. The medication in the patch is effective for 72 hours, after which it should be removed.  Wrap patch in a tissue and discard in the trash. Wash hands thoroughly with soap and water. 2. You may remove the patch earlier than 72 hours if you experience unpleasant side effects which may include dry mouth, dizziness or visual disturbances. 3. Avoid touching the patch. Wash your hands with soap and water after contact with the patch.    Call your surgeon if you experience:   1.  Fever over 101.0. 2.  Inability to urinate. 3.  Nausea and/or vomiting. 4.  Extreme swelling or bruising at the surgical site. 5.  Continued bleeding from the incision. 6.  Increased pain, redness or drainage from the incision. 7.  Problems related to your pain medication. 8.  Any problems  and/or concerns

## 2016-07-19 ENCOUNTER — Encounter (HOSPITAL_BASED_OUTPATIENT_CLINIC_OR_DEPARTMENT_OTHER): Payer: Self-pay | Admitting: Otolaryngology

## 2016-08-06 ENCOUNTER — Other Ambulatory Visit: Payer: Self-pay | Admitting: Obstetrics and Gynecology

## 2016-08-06 ENCOUNTER — Encounter: Payer: Self-pay | Admitting: Obstetrics and Gynecology

## 2016-08-06 ENCOUNTER — Telehealth: Payer: Self-pay | Admitting: Obstetrics and Gynecology

## 2016-08-06 ENCOUNTER — Ambulatory Visit (INDEPENDENT_AMBULATORY_CARE_PROVIDER_SITE_OTHER): Payer: BLUE CROSS/BLUE SHIELD | Admitting: Obstetrics and Gynecology

## 2016-08-06 DIAGNOSIS — N644 Mastodynia: Secondary | ICD-10-CM | POA: Diagnosis not present

## 2016-08-06 NOTE — Progress Notes (Signed)
[facility] Visit  08/06/16            Patient name: Ann Fuller MRN [id number]  Date of birth: 08/20/1946  CC & HPI:  Ann Fuller is a 70 y.o. female presenting today for intermittent, unchanged bilateral breast pain. Patient reports a history of breast CA with a lumpectomy with chemotherapy and radiation in 1990 and chemotherapy and reconstructive surgery with implants in 1995.    She has a history of GERD and reports increased symptoms including esophageal burning. She also reports difficulty with her weight loss attempts after the death of her sister who she used to exercise with.   ROS:  ROS +breast pain  +cough +GERD symptoms  -nipple discharge  Pertinent History Reviewed:   Reviewed: Significant for breast cancer Medical         Past Medical History:  Diagnosis Date  . Adenomatous colon polyp   . Arthritis   . Asthma   . Breast cancer ([place]) [id number]  . Bulging lumbar disc   . Depression   . Diverticulitis   . Dyspepsia   . Essential hypertension, benign   . GERD (gastroesophageal reflux disease)   . Heart murmur   . IBS (irritable bowel syndrome)   . Internal hemorrhoid   . PONV (postoperative nausea and vomiting)   . Right shoulder pain   . Shortness of breath dyspnea    when walking a lot  . Tubular adenoma 11/2010  . Type 2 diabetes mellitus ([place])                               Surgical Hx:    Past Surgical History:  Procedure Laterality Date  . ABDOMINAL HYSTERECTOMY    . APPENDECTOMY    . BACK SURGERY  2007   Lumbar fusion  . CATARACT EXTRACTION W/PHACO Left 08/27/2013   Procedure: CATARACT EXTRACTION PHACO AND INTRAOCULAR LENS PLACEMENT (IOC);  Surgeon: Tonny Branch, MD;  Location: [facility];  Service: Ophthalmology;  Laterality: Left;  CDE 4.25  . CATARACT EXTRACTION W/PHACO Right 09/24/2013   Procedure: CATARACT EXTRACTION PHACO AND INTRAOCULAR LENS PLACEMENT (IOC);  Surgeon: Tonny Branch, MD;  Location: [facility];  Service:  Ophthalmology;  Laterality: Right;  CDE:  8.16  . CHOLECYSTECTOMY    . COLONOSCOPY  12/13/2010   Dr. Margarito Courser pancolonic diverticular.tubular adenoma  . COLONOSCOPY  07/13/2003  . COLONOSCOPY N/A 10/28/2015   [id number] diverticulosis  . ESOPHAGOGASTRODUODENOSCOPY  12/13/2010   Dr. Jennet Maduro hernia, fundal gland type polyps  . EYE SURGERY Left    Left KPE 08/27/13  . MASTECTOMY  1995  . NASAL ENDOSCOPY WITH EPISTAXIS CONTROL Left 07/16/2016   Procedure: ENDOSCOPIC LEFT NASAL CAUTERY;  Surgeon: Leta Baptist, MD;  Location: [place];  Service: ENT;  Laterality: Left;  . TONSILLECTOMY    . TUBAL LIGATION     Medications: Reviewed & Updated - see associated section                       Current Outpatient Prescriptions:  .  amLODipine (NORVASC) 5 MG tablet, Take 5 mg by mouth daily., Disp: , Rfl:  .  atorvastatin (LIPITOR) 40 MG tablet, Take 40 mg by mouth at bedtime. , Disp: , Rfl:  .  baclofen (LIORESAL) 10 MG tablet, Take 5 mg by mouth 3 (three) times daily., Disp: , Rfl:  .  benzonatate (TESSALON) 200  MG capsule, Take 200 mg by mouth 3 (three) times daily as needed for cough., Disp: , Rfl:  .  chlorpheniramine (CHLOR-TRIMETON) 4 MG tablet, Take 4 mg by mouth every 4 (four) hours as needed (drippy nose, drainage, throat clearing). , Disp: , Rfl:  .  chlorthalidone (HYGROTON) 25 MG tablet, Take 25 mg by mouth every morning. , Disp: , Rfl:  .  dexlansoprazole (DEXILANT) 60 MG capsule, Take 60 mg by mouth daily before breakfast. , Disp: , Rfl:  .  dicyclomine (BENTYL) 10 MG capsule, TAKE ONE CAPSULE BY MOUTH PRIOR TO MEALSAS NEEDED. NO MORE THAN FOUR TIMES DAILY. MAY CAUSE DROWINESS. (Patient taking differently: Take 1 tablet by mouth 4 times a day as needed for abdomninal cramps), Disp: 120 capsule, Rfl: 11 .  doxazosin (CARDURA) 2 MG tablet, Take 2 mg by mouth every morning. , Disp: , Rfl:  .  exenatide (BYETTA 10 MCG PEN) 10 MCG/0.04ML SOLN, Inject 10 mcg into the skin 2  (two) times daily with a meal. , Disp: , Rfl:  .  famotidine (PEPCID) 20 MG tablet, One at bedtime, Disp: , Rfl:  .  HYDROcodone-acetaminophen (NORCO) 5-325 MG tablet, Take 1-2 tablets by mouth every 4 (four) hours as needed for moderate pain., Disp: 40 tablet, Rfl: 0 .  Insulin Glargine (LANTUS) 100 UNIT/ML Solostar Pen, Inject 30 Units into the skin every morning. , Disp: , Rfl:  .  ipratropium-albuterol (DUONEB) 0.5-2.5 (3) MG/3ML SOLN, Take 3 mLs by nebulization every 4 (four) hours. (Patient taking differently: Take 3 mLs by nebulization every 6 (six) hours as needed (wheezing and shortness of breath). ), Disp: 360 mL, Rfl: 6 .  LORazepam (ATIVAN) 1 MG tablet, Take 1 mg by mouth 2 (two) times daily as needed. Anxiety/Sleep, Disp: , Rfl:  .  magnesium oxide (MAG-OX) 400 MG tablet, Take 400 mg by mouth daily., Disp: , Rfl:  .  mometasone-formoterol (DULERA) 200-5 MCG/ACT AERO, Inhale 2 puffs into the lungs 2 (two) times daily., Disp: , Rfl:  .  POTASSIUM PO, Take 1 tablet by mouth daily., Disp: , Rfl:  .  temazepam (RESTORIL) 15 MG capsule, Take 15 mg by mouth at bedtime as needed for sleep., Disp: , Rfl:  .  valsartan (DIOVAN) 160 MG tablet, Take 1 tablet (160 mg total) by mouth daily. (Patient taking differently: Take 320 mg by mouth daily. ), Disp: 30 tablet, Rfl: 11   Social History: Reviewed -  reports that she has never smoked. She has never used smokeless tobacco.  Objective Findings:  Vitals: Blood pressure 140/84.  Physical Examination: General appearance - alert, well appearing, and in no distress and oriented to person, place, and time Mental status - alert, oriented to person, place, and time, normal mood, behavior, speech, dress, motor activity, and thought processes Breasts -  Left: s/p mastectomy with implant, firm without suspicious areas.  Right: no dimpling, retractions, or masses. Point tenderness medial aspect of breast near end of port-a-cath scar. Axilla negative  bilaterally  Pelvic - examination not indicated   Assessment & Plan:   A:  1. Right breast pain, no anatomic abnormalities 2 hx breast cancer, Left breast  P:  1. [facility] have requested fax of the form required by diagnostic center.  By signing my name below, I, Sonum Patel, attest that this documentation has been prepared under the direction and in the presence of Jonnie Kind, MD. Electronically Signed: Sonum Patel, Education administrator. 08/06/16.  3:16 PM.    I personally performed the services described in this documentation, which was SCRIBED in my presence. The recorded information has been reviewed and considered accurate. It has been edited as necessary during review. Jonnie Kind, MD

## 2016-08-07 ENCOUNTER — Ambulatory Visit
Admission: RE | Admit: 2016-08-07 | Discharge: 2016-08-07 | Disposition: A | Discharge status: Home / Self Care | Payer: BLUE CROSS/BLUE SHIELD | Source: Ambulatory Visit | Attending: Obstetrics and Gynecology | Admitting: Obstetrics and Gynecology

## 2016-08-07 ENCOUNTER — Telehealth: Payer: Self-pay | Admitting: Obstetrics and Gynecology

## 2016-08-07 ENCOUNTER — Encounter: Payer: Self-pay | Admitting: Internal Medicine

## 2016-08-07 ENCOUNTER — Ambulatory Visit (INDEPENDENT_AMBULATORY_CARE_PROVIDER_SITE_OTHER): Payer: BLUE CROSS/BLUE SHIELD | Admitting: Internal Medicine

## 2016-08-07 VITALS — BP 134/68 | HR 87 | Ht 59.0 in | Wt 236.4 lb

## 2016-08-07 DIAGNOSIS — J4530 Mild persistent asthma, uncomplicated: Secondary | ICD-10-CM | POA: Diagnosis not present

## 2016-08-07 DIAGNOSIS — N644 Mastodynia: Secondary | ICD-10-CM

## 2016-08-07 DIAGNOSIS — R058 Other specified cough: Secondary | ICD-10-CM

## 2016-08-07 DIAGNOSIS — R05 Cough: Secondary | ICD-10-CM

## 2016-08-07 MED ORDER — PREDNISONE 10 MG PO TABS: ORAL_TABLET | ORAL | 0 refills | Status: DC

## 2016-08-07 MED ORDER — MOMETASONE FURO-FORMOTEROL FUM 200-5 MCG/ACT IN AERO: 2.0000 | INHALATION_SPRAY | Freq: Two times a day (BID) | RESPIRATORY_TRACT | 0 refills | Status: DC

## 2016-08-07 MED ORDER — HYDROCODONE-ACETAMINOPHEN 5-325 MG PO TABS: 1.0000 | ORAL_TABLET | Freq: Four times a day (QID) | ORAL | 0 refills | Status: DC | PRN

## 2016-08-07 MED ORDER — AZITHROMYCIN 250 MG PO TABS: ORAL_TABLET | ORAL | 0 refills | Status: DC

## 2016-08-07 NOTE — Patient Instructions (Addendum)
Ok to try 2 chlorphenriamine along with the pepcid 20 mg at bedtime to see if helps night time cough   zpak   Prednisone 10 mg take  4 each am x 2 days,   2 each am x 2 days,  1 each am x 2 days and stop    Be sure to take the dulera 200 first thing in am and agan 12 hours later  Work on inhaler technique:  relax and gently blow all the way out then take a nice smooth deep breath back in, triggering the inhaler at same time you start breathing in.  Hold for up to 5 seconds if you can. Blow out thru nose. Rinse and gargle with water when done  See calendar for specific medication instructions and bring it back for each and every office visit for every healthcare provider you see.  Without it,  you may not receive the best quality medical care that we feel you deserve.  You will note that the calendar groups together  your maintenance  medications that are timed at particular times of the day.  Think of this as your checklist for what your doctor has instructed you to do until your next evaluation to see what benefit  there is  to staying on a consistent group of medications intended to keep you well.  The other group at the bottom is entirely up to you to use as you see fit  for specific symptoms that may arise between visits that require you to treat them on an as needed basis.  Think of this as your action plan or "what if" list.   Separating the top medications from the bottom group is fundamental to providing you adequate care going forward.       Please schedule a follow up office visit in 4 weeks, sooner if needed with all meds/ inhlaers/devices in hand

## 2016-08-07 NOTE — Progress Notes (Signed)
Subjective:     Patient ID: Ann Fuller, female   DOB: 02-Oct-1946,    MRN: [id number]    Brief patient profile:  49 yobf never smoker with tendency to reflux x 2011 then onset wheeze / cough labeled as asthma and worse x early 2016  always with better short pred / abx but only for only for a few weeks then the cough starts again despite maint rx with symbicort  And followed by Dr Joya Gaskins who referred her Kyerra Vargo 10/10/2015 with no evidence of any airflow obst during flare of cough 02/02/2016.   As of 08/07/2016 changed hx to at least decade   History of Present Illness  10/10/2015   1st office visit/ Milton Streicher/ transfer of care.   Chief Complaint  Patient presents with  . Follow-up    Pt states that her cough has been worse- producing some yellow sputum. She notices wheezing at night and is using neb 2 x per wk on average.   last visit was 08/08/15 no pna on cxr or CT and  rx with levaquin and predisone = > 100% better and maint symbicort / dexilant / cozar but gradaully worse x 2weeks Even when better doe x [employer] leaning on basket  >> Changed Symbicort to [facility], changed Cozaar to Diovan  10/24/2015 NP  follow-up asthma  Patient returns for a two-week follow-up Patient was last seen 2 weeks ago with Dr. Melvyn Novas. She was having more cough and wheezing. She was changed over from Symbicort to [facility] and Cozaar to Diovan. Patient says she is feeling improved. She denies any hemoptysis, chest pain, orthopnea, PND or leg swelling. Flu shot is utd . rec No change dulera     02/02/2016  f/u ov/Advay Volante re: uacs vs asthma on dulera 100 / singulair / on pepcid at bedtime and ? dexilant in am (did not recognize it from list)  Chief Complaint  Patient presents with  . Follow-up    Increased cough x 1 wk- prod with yellow sputum.  Breathing has been slightly worse since cough started back. She uses rescue inhaler 3 x per wk on average.   ok sitting still/ in general able to lie down on  2 pillows /  doe x [employer] better with cart worse without it  Cough comes back with exp to temperature changes/ certain smells / fumes while cooking  rec GERD diet  Stop evening dulera x 2 weeks then stop the am dose, if symptoms recur then recur but work on technique Prednisone 10 mg take  4 each am x 2 days,   2 each am x 2 days,  1 each am x 2 days and stop  Please schedule a follow up visit in 3 months but call sooner if needed When return bring your medications in 2 separate bags, the ones you take no matter(automatically)  what vs the as needed (only when you feel you need them)    04/24/16 [place] eval for flare rec 1. Start prednisone 20 mg/tab 1 tab daily for 7 days 2. Cont dulera as you are using. Wean off when better. 3. Cont albuterol neb every 4 hrs as need for shortness of breath.     04/26/2016 acute extended ov/[place] re: uacs vs asthma maint rx dex/pepcid Chief Complaint  Patient presents with  . Acute Visit    Increased SOB, left flank pain and cough since ov 04/24/16- producing some yellow sputum. She states she has been wheezing at night.  cough started first on 04/21/16 s antecedent symptoms and then started dulera 100 04/22/16 though on "0" at ov/ L flank pain night prior to OV worse with coughing fits  avg use h1 may be twice a week  rec zpak Take prednisone 20 mg x 2 today then 1 daily until gone Depomedrol 120 IM  Take delsym two tsp every 12 hours and supplement if needed with Norco(vicodin) up to 2 every 4 hours to suppress the urge to cough.   Once you have eliminated the cough for 3 straight days try reducing the Norco first,  then the delsym as tolerated.   GERD  Diet   For drainage / throat tickle try take CHLORPHENIRAMINE  4 mg - take one every 4 hours as needed -   Keep your previous appt to see me 05/25/16  Late add needs cxr on return also ? Why stopped singulair?      05/25/2016  f/u ov/Brenee Gajda re: ? Cap vs arifact from breast implant in pt with   uacs  not needing saba   On dulera 200 2bid  Via spacer didn't bring Chief Complaint  Patient presents with  . Follow-up    Breathing is much improved. Her cough is also much better and she is no longer having left flank pain.   very easily confused with details of care, names of meds x 2 pages rec Needs med cal    06/08/2016 NP  Follow up : Med review .  Patient returns for a two-week follow-up Last visit. Patient is feeling okay, did have some dry cough one night prior to OV   We reviewed all her medications organize them into a medication calendar with patient education. rec Continue on Dulera 2 puffs Twice daily, riinse after use.  Follow med calendar closely and bring to each visit.   Teoh rx 06/2016 for epistaxis   08/07/2016 acute extended ov/Harvy Riera re: recurrent cough / not using med calendar or action plan  Chief Complaint  Patient presents with  . Acute Visit    pt c/o worsening sometimes prod cough with yellow mucus, increased SOB, sensation of "lump in throat" X2 wks.  Cough worse qhs.   says prednisone always helps cough / sob but not using dulera 200 first thing in am as per med calendar .  No sob at rest but with > slow adls.   ? When were you last all better"  A now is > 10 years (changed from original)   No obvious day to day or daytime variability or assoc   cp or chest tightness, subjective wheeze or overt  hb symptoms. No unusual exp hx or h/o childhood pna/ asthma or knowledge of premature birth.  Sleeping ok without nocturnal  or early am exacerbation  of respiratory  c/o's or need for noct saba. Also denies any obvious fluctuation of symptoms with weather or environmental changes or other aggravating or alleviating factors except as outlined above   Current Medications, Allergies, Complete Past Medical History, Past Surgical History, Family History, and Social History were reviewed in [employer] [employer] record.  ROS  The following are not active complaints unless  bolded sore throat, dysphagia, dental problems, itching, sneezing,  nasal congestion or excess/ purulent secretions, ear ache,   fever, chills, sweats, unintended wt loss, classically pleuritic or exertional cp, hemoptysis,  orthopnea pnd or leg swelling, presyncope, palpitations, abdominal pain, anorexia, nausea, vomiting, diarrhea  or change in bowel or bladder habits, change in stools  or urine, dysuria,hematuria,  rash, arthralgias, visual complaints, headache, numbness, weakness or ataxia or problems with walking or coordination,  change in mood/affect or memory.                     Objective:   Physical Exam  amb obese bf  Nad   Wt Readings from Last 3 Encounters:  08/07/16 236 lb 6.4 oz (107.2 kg)  07/16/16 236 lb (107 kg)  06/08/16 234 lb (106.1 kg)    Vital signs reviewed      HEENT: nl dentition, turbinates, and orophanx. Nl external ear canals without cough reflex   NECK :  without JVD/Nodes/TM/ nl carotid upstrokes bilaterally   LUNGS: no acc muscle use, clear to A and P bilaterally without cough on insp or exp maneuvers   CV:  RRR  no s3 or murmur or increase in P2, no edema   ABD:  soft and nontender with nl excursion in the supine position. No bruits or organomegaly, bowel sounds nl  MS:  warm without deformities, calf tenderness, cyanosis or clubbing  SKIN: warm and dry without lesions    NEURO:  alert, approp, no deficits

## 2016-08-07 NOTE — Telephone Encounter (Signed)
Pt called stating that she is returning a phone call from Dr. Johnnye Sima nurse. I looked in chart it looks like Dr. Glo Herring gave a call to the pt. Please contact pt

## 2016-08-08 NOTE — Assessment & Plan Note (Addendum)
07/06/2015 p extensive coaching HFA effectiveness =   50% s spacer  -10/10/2015    try change symb 160 to dulera 100 2bid  - CT chest 06/2015>>neg - PFT's wnl during flare of cough 02/02/2016 > try wean dulera - 02/02/2016  extensive coaching HFA effectiveness =    50% from a baseline of < 25%  - 08/07/2016  After extensive coaching HFA effectiveness =    50% (did not bring spacer for training    Will try again on high dose dulera since says prednisone always helps and maintain on max dulera for now  - have asked her to bring the spacer with her if she's going to use it at home / trust but verify approach

## 2016-08-08 NOTE — Assessment & Plan Note (Signed)
-   CT sinus 04/02/15 > No evidence of sinusitis  - singulair trial 10/10/2015 > off it as of 04/26/2016 ? If helped not clear  -  allergy profile 10/10/2015 >>  IgE 102 but no sign Pos Rast,  Eos 0.2   Lack of cough resolution on a verified empirical regimen could mean an alternative diagnosis only if pt is actually taking her rx as instructed , persistence of the disease state (eg sinusitis or bronchiectasis) , or inadequacy of currently available therapy (eg no medical rx available for non-acid gerd) - all three are potential areas of concern here.  The reported steroid response is suggestive or eos rhinitis/ bronchitis or cough variant asthma  (see separate a/p)  I had an extended discussion with the patient reviewing all relevant studies completed to date and  lasting 25 minutes of a 40  minute extended  visit  Addressing unresolved symptoms x( it turns out) over a decade  The standardized cough guidelines published in Chest by Lissa Morales in 2006 are still the best available and consist of a multiple step process (up to 12!) , not a single office visit,  and are intended  to address this problem logically,  with an alogrithm dependent on response to empiric treatment at  each progressive step  to determine a specific diagnosis with  minimal addtional testing needed. Therefore if adherence is an issue or can't be accurately verified,  it's very unlikely the standard evaluation and treatment will be successful here.    Furthermore, response to therapy (other than acute cough suppression, which should only be used short term with avoidance of narcotic containing cough syrups if possible), can be a gradual process for which the patient is not likely to  perceive immediate benefit.  Unlike going to an eye doctor where the best perscription is almost always the first one and is immediately effective, this is almost never the case in the management of chronic cough syndromes. Therefore the patient needs to  commit up front to consistently adhere to recommendations  for up to 6 weeks of therapy directed at the likely underlying problem(s) before the response can be reasonably evaluated.   Each maintenance medication was reviewed in detail including most importantly the difference between maintenance and prns and under what circumstances the prns are to be triggered using an action plan format that is not reflected in the computer generated alphabetically organized AVS.    Please see instructions for details which were reviewed in writing and the patient given a copy highlighting the part that I personally wrote and discussed at today's ov.

## 2016-08-10 ENCOUNTER — Ambulatory Visit: Payer: BLUE CROSS/BLUE SHIELD | Admitting: Internal Medicine

## 2016-08-13 ENCOUNTER — Ambulatory Visit (INDEPENDENT_AMBULATORY_CARE_PROVIDER_SITE_OTHER): Payer: BLUE CROSS/BLUE SHIELD | Admitting: Otolaryngology

## 2016-08-13 DIAGNOSIS — R040 Epistaxis: Secondary | ICD-10-CM

## 2016-08-14 NOTE — Telephone Encounter (Signed)
Diagnostic mammogram was benign , reassuring of normal findings.

## 2016-08-25 ENCOUNTER — Other Ambulatory Visit: Payer: Self-pay | Admitting: Internal Medicine

## 2016-08-29 ENCOUNTER — Encounter (HOSPITAL_COMMUNITY): Payer: Self-pay | Admitting: *Deleted

## 2016-08-29 ENCOUNTER — Emergency Department (HOSPITAL_COMMUNITY)
Admission: EM | Admit: 2016-08-29 | Discharge: 2016-08-29 | Disposition: A | Discharge status: Home / Self Care | Payer: BLUE CROSS/BLUE SHIELD | Attending: Emergency Medicine | Admitting: Emergency Medicine

## 2016-08-29 ENCOUNTER — Emergency Department (HOSPITAL_COMMUNITY): Payer: BLUE CROSS/BLUE SHIELD

## 2016-08-29 DIAGNOSIS — E119 Type 2 diabetes mellitus without complications: Secondary | ICD-10-CM | POA: Insufficient documentation

## 2016-08-29 DIAGNOSIS — R05 Cough: Secondary | ICD-10-CM | POA: Insufficient documentation

## 2016-08-29 DIAGNOSIS — J4530 Mild persistent asthma, uncomplicated: Secondary | ICD-10-CM | POA: Insufficient documentation

## 2016-08-29 DIAGNOSIS — Z853 Personal history of malignant neoplasm of breast: Secondary | ICD-10-CM | POA: Diagnosis not present

## 2016-08-29 DIAGNOSIS — Z794 Long term (current) use of insulin: Secondary | ICD-10-CM | POA: Diagnosis not present

## 2016-08-29 DIAGNOSIS — I10 Essential (primary) hypertension: Secondary | ICD-10-CM | POA: Insufficient documentation

## 2016-08-29 DIAGNOSIS — R059 Cough, unspecified: Secondary | ICD-10-CM

## 2016-08-29 MED ORDER — AZITHROMYCIN 250 MG PO TABS
500.0000 mg | ORAL_TABLET | Freq: Once | ORAL | Status: AC
  Administered 2016-08-29: 500 mg via ORAL
  Filled 2016-08-29: qty 2

## 2016-08-29 MED ORDER — AZITHROMYCIN 250 MG PO TABS: ORAL_TABLET | ORAL | 0 refills | Status: DC

## 2016-08-29 MED ORDER — PREDNISONE 10 MG PO TABS: ORAL_TABLET | ORAL | 0 refills | Status: DC

## 2016-08-29 MED ORDER — PREDNISONE 20 MG PO TABS
40.0000 mg | ORAL_TABLET | Freq: Once | ORAL | Status: AC
  Administered 2016-08-29: 40 mg via ORAL
  Filled 2016-08-29: qty 2

## 2016-08-29 NOTE — ED Triage Notes (Signed)
Pt comes in with productive cough since 8/26. Pt has nasal congestion as well. Denies any n/v/d. Pt only has pain in her side from coughing.

## 2016-08-29 NOTE — ED Provider Notes (Signed)
[facility] DEPT Provider Note   CSN: [id number] Arrival date & time: 08/29/16  1727     History   Chief Complaint Chief Complaint  Patient presents with  . Nasal Congestion  . Cough    HPI Ann Fuller is a 70 y.o. female.  HPI   Patient is a 70 year old female with history of diabetes, GERD, HTN, breast cancer, asthma who presents the emergency department with progressively worsening cough for one half weeks. Patient is followed by a pulmonologist who gave her prednisone and a Z-Pak on 8/15. She states her cough went away and returned on 8/27. She states her cough is worse mainly at night. She is taken albuterol, Tessalon Perles, Norco, Tussionex, Dulera without relief. Patient denies other associated symptoms. Patient denies fever, chills, sore throat, sinus congestion, postnasal drainage, rhinorrhea, abdominal pain, nausea, chest pain, shortness of breath.  Past Medical History:  Diagnosis Date  . Adenomatous colon polyp   . Arthritis   . Asthma   . Breast cancer ([place]) [id number]  . Bulging lumbar disc   . Depression   . Diverticulitis   . Dyspepsia   . Essential hypertension, benign   . GERD (gastroesophageal reflux disease)   . Heart murmur   . IBS (irritable bowel syndrome)   . Internal hemorrhoid   . PONV (postoperative nausea and vomiting)   . Right shoulder pain   . Shortness of breath dyspnea    when walking a lot  . Tubular adenoma 11/2010  . Type 2 diabetes mellitus [facility])     Patient Active Problem List   Diagnosis Date Noted  . Breast pain, right 08/06/2016  . CAP (community acquired pneumonia) 04/29/2016  . History of adenomatous polyp of colon   . Diverticulosis of colon without hemorrhage   . Adenomatous polyp of colon 10/17/2015  . Upper airway cough syndrome 10/10/2015  . Morbid obesity ([place]) 10/10/2015  . Diastolic dysfunction [id number]  . Insomnia 04/06/2015  . Mild persistent asthma in adult without complication [id number]  .  IBS (irritable bowel syndrome) 08/25/2014  . Syncope 03/08/2014  . Murmur, cardiac 03/08/2014  . Type 2 diabetes mellitus ([place]) 03/05/2014  . Essential hypertension, benign 03/05/2014  . GERD (gastroesophageal reflux disease) 11/23/2011  . CARCINOMA, BREAST, HX OF 11/21/2010    Past Surgical History:  Procedure Laterality Date  . ABDOMINAL HYSTERECTOMY    . APPENDECTOMY    . BACK SURGERY  2007   Lumbar fusion  . CATARACT EXTRACTION W/PHACO Left 08/27/2013   Procedure: CATARACT EXTRACTION PHACO AND INTRAOCULAR LENS PLACEMENT (IOC);  Surgeon: Tonny Branch, MD;  Location: [facility];  Service: Ophthalmology;  Laterality: Left;  CDE 4.25  . CATARACT EXTRACTION W/PHACO Right 09/24/2013   Procedure: CATARACT EXTRACTION PHACO AND INTRAOCULAR LENS PLACEMENT (IOC);  Surgeon: Tonny Branch, MD;  Location: [facility];  Service: Ophthalmology;  Laterality: Right;  CDE:  8.16  . CHOLECYSTECTOMY    . COLONOSCOPY  12/13/2010   Dr. Margarito Courser pancolonic diverticular.tubular adenoma  . COLONOSCOPY  07/13/2003  . COLONOSCOPY N/A 10/28/2015   [id number] diverticulosis  . ESOPHAGOGASTRODUODENOSCOPY  12/13/2010   Dr. Jennet Maduro hernia, fundal gland type polyps  . EYE SURGERY Left    Left KPE 08/27/13  . MASTECTOMY  1995  . NASAL ENDOSCOPY WITH EPISTAXIS CONTROL Left 07/16/2016   Procedure: ENDOSCOPIC LEFT NASAL CAUTERY;  Surgeon: Leta Baptist, MD;  Location: [place];  Service: ENT;  Laterality: Left;  . TONSILLECTOMY    . TUBAL LIGATION  OB History    No data available       Home Medications    Prior to Admission medications   Medication Sig Start Date End Date Taking? Authorizing Provider  amLODipine (NORVASC) 5 MG tablet Take 5 mg by mouth daily.    Historical Provider, MD  atorvastatin (LIPITOR) 40 MG tablet Take 40 mg by mouth at bedtime.     Historical Provider, MD  azithromycin (ZITHROMAX) 250 MG tablet Take 1 tablet daily x 4 days 08/29/16   Kalman Drape, PA  baclofen  (LIORESAL) 10 MG tablet Take 5 mg by mouth 3 (three) times daily.    Historical Provider, MD  benzonatate (TESSALON) 200 MG capsule TAKE ONE CAPSULE BY MOUTH EVERY 6 HOURS FOR COUGH 08/28/16   Tanda Rockers, MD  chlorpheniramine (CHLOR-TRIMETON) 4 MG tablet Take 4 mg by mouth every 4 (four) hours as needed (drippy nose, drainage, throat clearing).     Historical Provider, MD  chlorthalidone (HYGROTON) 25 MG tablet Take 25 mg by mouth every morning.     Historical Provider, MD  dexlansoprazole (DEXILANT) 60 MG capsule Take 60 mg by mouth daily before breakfast.     Historical Provider, MD  dicyclomine (BENTYL) 10 MG capsule TAKE ONE CAPSULE BY MOUTH PRIOR TO MEALSAS NEEDED. NO MORE THAN FOUR TIMES DAILY. MAY CAUSE DROWINESS. Patient taking differently: Take 1 tablet by mouth 4 times a day as needed for abdomninal cramps 11/14/15   Mahala Menghini, PA-C  doxazosin (CARDURA) 2 MG tablet Take 2 mg by mouth every morning.     Historical Provider, MD  exenatide (BYETTA 10 MCG PEN) 10 MCG/0.04ML SOLN Inject 10 mcg into the skin 2 (two) times daily with a meal.     Historical Provider, MD  famotidine (PEPCID) 20 MG tablet One at bedtime 10/10/15   Tanda Rockers, MD  HYDROcodone-acetaminophen (NORCO) 5-325 MG tablet Take 1 tablet by mouth every 6 (six) hours as needed for moderate pain. 08/07/16   Tanda Rockers, MD  Insulin Glargine (LANTUS) 100 UNIT/ML Solostar Pen Inject 30 Units into the skin every morning.     Historical Provider, MD  ipratropium-albuterol (DUONEB) 0.5-2.5 (3) MG/3ML SOLN Take 3 mLs by nebulization every 4 (four) hours. Patient taking differently: Take 3 mLs by nebulization every 6 (six) hours as needed (wheezing and shortness of breath).  04/10/15   Asencion Noble, MD  LORazepam (ATIVAN) 1 MG tablet Take 1 mg by mouth 2 (two) times daily as needed. Anxiety/Sleep    Historical Provider, MD  magnesium oxide (MAG-OX) 400 MG tablet Take 400 mg by mouth daily.    Historical Provider, MD    mometasone-formoterol (DULERA) 200-5 MCG/ACT AERO Inhale 2 puffs into the lungs 2 (two) times daily.    Historical Provider, MD  mometasone-formoterol (DULERA) 200-5 MCG/ACT AERO Inhale 2 puffs into the lungs 2 (two) times daily. 08/07/16 08/08/16  Tanda Rockers, MD  POTASSIUM PO Take 1 tablet by mouth daily.    Historical Provider, MD  predniSONE (DELTASONE) 10 MG tablet Take  4 each am x 1 days,   2 each am x 2 days,  1 each am x 2 days and stop 08/29/16   Fraser Din Almas Rake, PA  temazepam (RESTORIL) 15 MG capsule Take 15 mg by mouth at bedtime as needed for sleep.    Historical Provider, MD  valsartan (DIOVAN) 160 MG tablet Take 1 tablet (160 mg total) by mouth daily. Patient taking differently: Take 320 mg by  mouth daily.  10/10/15   Tanda Rockers, MD    Family History Family History  Problem Relation Age of Onset  . Colon cancer Mother   . Heart attack Father   . Heart attack Sister   . Stroke Sister   . Cancer - Lung Sister     Social History Social History  Substance Use Topics  . Smoking status: Never Smoker  . Smokeless tobacco: Never Used  . Alcohol use No     Allergies   Ceftin [cefuroxime axetil]; Codeine; Metformin and related; Nexium [esomeprazole magnesium]; Omnicef [cefdinir]; and Sulfa antibiotics   Review of Systems Review of Systems  Constitutional: Negative for chills and fever.  HENT: Negative for congestion, postnasal drip, rhinorrhea, sinus pressure, sore throat and trouble swallowing.   Eyes: Negative for visual disturbance.  Respiratory: Positive for cough. Negative for shortness of breath.   Cardiovascular: Negative for chest pain.  Gastrointestinal: Negative for abdominal pain and nausea.  Skin: Negative for rash.  Neurological: Negative for dizziness and syncope.     Physical Exam Updated Vital Signs BP 160/78 (BP Location: Right Arm)   Pulse 94   Temp 98.7 F (37.1 C) (Oral)   Resp 16   Ht 4\' 11"  (1.499 m)   Wt 117.9 kg   SpO2 97%   BMI  52.51 kg/m   Physical Exam  Constitutional: She appears well-developed and well-nourished. No distress.  HENT:  Head: Normocephalic and atraumatic.  Eyes: Conjunctivae are normal.  Cardiovascular: Normal rate, regular rhythm and normal heart sounds.  Exam reveals no gallop and no friction rub.   No murmur heard. Pulmonary/Chest: Effort normal. No respiratory distress. She has no wheezes. She has no rhonchi. She has no rales.  Slightly diminished breath sounds on the left side  Musculoskeletal: Normal range of motion.  Neurological: She is alert. Coordination normal.  Skin: Skin is warm and dry. She is not diaphoretic.  Psychiatric: She has a normal mood and affect. Her behavior is normal.  Nursing note and vitals reviewed.    ED Treatments / Results  Labs (all labs ordered are listed, but only abnormal results are displayed) Labs Reviewed - No data to display  EKG  EKG Interpretation None       Radiology Dg Chest 2 View  Result Date: 08/29/2016 CLINICAL DATA:  70 year old female with productive cough and sneezing EXAM: CHEST  2 VIEW COMPARISON:  Chest radiograph dated 05/25/2016 FINDINGS: Two views of the chest demonstrate bilateral mid to lower lung field interstitial prominence similar to or slightly improved compared to the prior study. There is no focal consolidation, pleural effusion, or pneumothorax. The cardiac silhouette is within normal limits. No acute osseous pathology identified. Multiple surgical clips noted in the left chest wall. Linear and nodular skin calcification noted over the left breast. IMPRESSION: No focal consolidation. Mild bibasilar interstitial prominence, likely related to atelectatic changes, with interval improvement compared to prior study. Electronically Signed   By: Anner Crete M.D.   On: 08/29/2016 18:46    Procedures Procedures (including critical care time)  Medications Ordered in ED Medications  predniSONE (DELTASONE) tablet 40 mg  (40 mg Oral Given 08/29/16 1947)  azithromycin (ZITHROMAX) tablet 500 mg (500 mg Oral Given 08/29/16 1947)     Initial Impression / Assessment and Plan / ED Course  I have reviewed the triage vital signs and the nursing notes.  Pertinent labs & imaging results that were available during my care of the patient were reviewed by  me and considered in my medical decision making (see chart for details).  Clinical Course    Patient presents with chronic cough. Previous treatment from pulmonologist of prednisone and azithromycin relieved her symptoms back in August. Will discharge patient with prednisone and azithromycin. She has a follow-up appointment with her pulmonologist this coming Friday. I instructed her to keep that appointment. Patient afebrile, VSS, no chest pain or shortness of breath, no wheezes, chest x-ray reviewed by me revealed no acute infiltrates or consolidation, less concerning for pneumonia. Patient stable at time of discharge. Discussed strict return precautions. Patient expressed understanding to the discharge instructions.  Pt case discussed and pt seen by Dr. Lacinda Axon who agrees with the above plan.  Final Clinical Impressions(s) / ED Diagnoses   Final diagnoses:  Cough    New Prescriptions Current Discharge Medication List       Kalman Drape, [place] 08/29/16 [place], MD 08/30/16 [phone]

## 2016-08-29 NOTE — Discharge Instructions (Signed)
You were given 40 mg of prednisone and 500 mg of azithromycin here in the ED. Take the prednisone and azithromycin as prescribed starting tomorrow. Keep your appointment with your pulmonologist for Friday. Return to emergency department if you experience fever, chest pain, productive cough, shortness of breath, or any other concerning symptoms.

## 2016-08-29 NOTE — ED Notes (Signed)
Pt reports coughing since 08/19/16. Cough is productive and wheezing at times. Upper lungs diminished

## 2016-08-31 ENCOUNTER — Encounter: Payer: Self-pay | Admitting: Internal Medicine

## 2016-08-31 ENCOUNTER — Ambulatory Visit (INDEPENDENT_AMBULATORY_CARE_PROVIDER_SITE_OTHER): Payer: BLUE CROSS/BLUE SHIELD | Admitting: Internal Medicine

## 2016-08-31 VITALS — BP 124/80 | HR 100 | Ht 59.0 in | Wt 238.0 lb

## 2016-08-31 DIAGNOSIS — R05 Cough: Secondary | ICD-10-CM

## 2016-08-31 DIAGNOSIS — R058 Other specified cough: Secondary | ICD-10-CM

## 2016-08-31 DIAGNOSIS — J45991 Cough variant asthma: Secondary | ICD-10-CM | POA: Diagnosis not present

## 2016-08-31 DIAGNOSIS — J4530 Mild persistent asthma, uncomplicated: Secondary | ICD-10-CM | POA: Diagnosis not present

## 2016-08-31 LAB — NITRIC OXIDE: NITRIC OXIDE: 23

## 2016-08-31 MED ORDER — AEROCHAMBER MV MISC: 0 refills | Status: AC

## 2016-08-31 MED ORDER — GABAPENTIN 100 MG PO CAPS: 100.0000 mg | ORAL_CAPSULE | Freq: Four times a day (QID) | ORAL | 2 refills | Status: DC

## 2016-08-31 MED ORDER — GABAPENTIN 100 MG PO CAPS: 120.0000 mg | ORAL_CAPSULE | Freq: Four times a day (QID) | ORAL | 2 refills | Status: DC

## 2016-08-31 MED ORDER — BUDESONIDE-FORMOTEROL FUMARATE 80-4.5 MCG/ACT IN AERO: 2.0000 | INHALATION_SPRAY | Freq: Two times a day (BID) | RESPIRATORY_TRACT | 11 refills | Status: DC

## 2016-08-31 MED ORDER — BUDESONIDE-FORMOTEROL FUMARATE 80-4.5 MCG/ACT IN AERO: 2.0000 | INHALATION_SPRAY | Freq: Two times a day (BID) | RESPIRATORY_TRACT | 0 refills | Status: AC

## 2016-08-31 NOTE — Progress Notes (Signed)
Subjective:     Patient ID: Ann Fuller, female   DOB: 1946-09-27,    MRN: [id number]    Brief patient profile:  78 yobf never smoker with tendency to reflux x 2011 then onset wheeze / cough labeled as asthma and worse x early 2016  always with better short pred / abx but only for only for a few weeks then the cough starts again despite maint Fuller with symbicort  And followed by Dr Ann Fuller who referred her Ann Fuller 10/10/2015 with no evidence of any airflow obst during flare of cough 02/02/2016.   As of 08/07/2016 changed hx to at least decade of cough    History of Present Illness  10/10/2015   1st office visit/ Ann Fuller/ transfer of care.   Chief Complaint  Patient presents with  . Follow-up    Pt states that her cough has been worse- producing some yellow sputum. She notices wheezing at night and is using neb 2 x per wk on average.   last visit was 08/08/15 no pna on cxr or CT and  Fuller with levaquin and predisone = > 100% better and maint symbicort / dexilant / cozar but gradaully worse x 2weeks Even when better doe x [employer] leaning on basket  >> Changed Symbicort to [facility], changed Cozaar to Diovan  10/24/2015 NP  follow-up asthma  Patient returns for a two-week follow-up Patient was last seen 2 weeks ago with Dr. Melvyn Fuller. She was having more cough and wheezing. She was changed over from Symbicort to [facility] and Cozaar to Diovan. Patient says she is feeling improved. She denies any hemoptysis, chest pain, orthopnea, PND or leg swelling. Flu shot is utd . rec No change dulera     02/02/2016  f/u ov/Ann Fuller re: uacs vs asthma on dulera 100 / singulair / on pepcid at bedtime and ? dexilant in am (did not recognize it from list)  Chief Complaint  Patient presents with  . Follow-up    Increased cough x 1 wk- prod with yellow sputum.  Breathing has been slightly worse since cough started back. She uses rescue inhaler 3 x per wk on average.   ok sitting still/ in general able to lie down on  2  pillows / doe x [employer] better with cart worse without it  Cough comes back with exp to temperature changes/ certain smells / fumes while cooking  rec GERD diet  Stop evening dulera x 2 weeks then stop the am dose, if symptoms recur then recur but work on technique Prednisone 10 mg take  4 each am x 2 days,   2 each am x 2 days,  1 each am x 2 days and stop  Please schedule a follow up visit in 3 months but call sooner if needed When return bring your medications in 2 separate bags, the ones you take no matter(automatically)  what vs the as needed (only when you feel you need them)    04/24/16 [facility] eval for flare rec 1. Start prednisone 20 mg/tab 1 tab daily for 7 days 2. Cont dulera as you are using. Wean off when better. 3. Cont albuterol neb every 4 hrs as need for shortness of breath.     04/26/2016 acute extended ov/Ann Fuller re: uacs vs asthma maint Fuller dex/pepcid Chief Complaint  Patient presents with  . Acute Visit    Increased SOB, left flank pain and cough since ov 04/24/16- producing some yellow sputum. She states she has been wheezing at  night.   cough started first on 04/21/16 s antecedent symptoms and then started dulera 100 04/22/16 though on "0" at ov/ L flank pain night prior to OV worse with coughing fits  avg use h1 may be twice a week  rec zpak Take prednisone 20 mg x 2 today then 1 daily until gone Depomedrol 120 IM  Take delsym two tsp every 12 hours and supplement if needed with Norco(vicodin) up to 2 every 4 hours to suppress the urge to cough.   Once you have eliminated the cough for 3 straight days try reducing the Norco first,  then the delsym as tolerated.   GERD  Diet   For drainage / throat tickle try take CHLORPHENIRAMINE  4 mg - take one every 4 hours as needed -   Keep your previous appt to see me 05/25/16  Late add needs cxr on return also ? Why stopped singulair?      05/25/2016  f/u ov/Ann Fuller re: ? Cap vs arifact from breast implant in pt with   uacs  not  needing saba  On dulera 200 2bid  Via spacer didn't bring Chief Complaint  Patient presents with  . Follow-up    Breathing is much improved. Her cough is also much better and she is no longer having left flank pain.   very easily confused with details of care, names of meds x 2 pages rec Needs med cal    06/08/2016 NP  Follow up : Med review .  Patient returns for a two-week follow-up Last visit. Patient is feeling okay, did have some dry cough one night prior to OV   We reviewed all her medications organize them into a medication calendar with patient education. rec Continue on Dulera 2 puffs Twice daily, riinse after use.  Follow med calendar closely and bring to each visit.   Ann Fuller 06/2016 for epistaxis   08/07/2016 acute extended ov/Ann Fuller re: recurrent cough / not using med calendar or action plan  Chief Complaint  Patient presents with  . Acute Visit    pt c/o worsening sometimes prod cough with yellow mucus, increased SOB, sensation of "lump in throat" X2 wks.  Cough worse qhs.   says prednisone always helps cough / sob but not using dulera 200 first thing in am as per med calendar .  No sob at rest but with > slow adls.   ? When were you last all better"  A now is > 10 years (changed from original)  rec Ok to try 2 chlorphenriamine along with the pepcid 20 mg at bedtime to see if helps night time cough  zpak  Prednisone 10 mg take  4 each am x 2 days,   2 each am x 2 days,  1 each am x 2 days and stop   Be sure to take the dulera 200 first thing in am and agan 12 hours later Work on inhaler technique:  See calendar for specific medication instructions  Separating the top medications from the bottom group is fundamental to providing you adequate care going forward.   Please schedule a follow up office visit in 4 weeks, sooner if needed with all meds/ inhlaers/devices in hand    08/31/2016  f/u ov/Ann Fuller re: cough x 10 years never completely better / no med cal/ empty dulera  200 Chief Complaint  Patient presents with  . Follow-up    Cough progressively worse since over the past few wks.  She states she had  coughing spell on 9/6 that lasted lasted hours and she she could not catch her breath-went to ED.   still thoroughly confused with meds / inhalers/ ? Has flutter "I have a barrel - that's what you called it"  No obvious day to day or daytime variability or assoc   cp or chest tightness, subjective wheeze or overt  hb symptoms. No unusual exp hx or h/o childhood pna/ asthma or knowledge of premature birth.  Sleeping ok without nocturnal  or early am exacerbation  of respiratory  c/o's or need for noct saba. Also denies any obvious fluctuation of symptoms with weather or environmental changes or other aggravating or alleviating factors except as outlined above   Current Medications, Allergies, Complete Past Medical History, Past Surgical History, Family History, and Social History were reviewed in [employer] record.  ROS  The following are not active complaints unless bolded sore throat, dysphagia, dental problems, itching, sneezing,  nasal congestion or excess/ purulent secretions, ear ache,   fever, chills, sweats, unintended wt loss, classically pleuritic or exertional cp, hemoptysis,  orthopnea pnd or leg swelling, presyncope, palpitations, abdominal pain, anorexia, nausea, vomiting, diarrhea  or change in bowel or bladder habits, change in stools or urine, dysuria,hematuria,  rash, arthralgias, visual complaints, headache, numbness, weakness or ataxia or problems with walking or coordination,  change in mood/affect or memory.                     Objective:   Physical Exam  amb obese bf  Harsh upper airway cough and pseudowheeze    08/31/2016         238  08/07/16 236 lb 6.4 oz (107.2 kg)  07/16/16 236 lb (107 kg)  06/08/16 234 lb (106.1 kg)    Vital signs reviewed   - note sats 98% RA on arrival   HEENT: nl dentition,  turbinates, and orophanx. Nl external ear canals without cough reflex   NECK :  without JVD/Nodes/TM/ nl carotid upstrokes bilaterally   LUNGS: no acc muscle use, clear to A and P bilaterally without cough on insp or exp maneuvers   CV:  RRR  no s3 or murmur or increase in P2, no edema   ABD:  soft and nontender with nl excursion in the supine position. No bruits or organomegaly, bowel sounds nl  MS:  warm without deformities, calf tenderness, cyanosis or clubbing  SKIN: warm and dry without lesions    NEURO:  alert, approp, no deficits    I personally reviewed images and agree with radiology impression as follows:  CXR:   08/29/16 No focal consolidation.  Mild bibasilar interstitial prominence, likely related to atelectatic changes, with interval improvement compared to prior study.

## 2016-08-31 NOTE — Assessment & Plan Note (Signed)
07/06/2015 p extensive coaching HFA effectiveness =   50% s spacer  -10/10/2015    try change symb 160 to dulera 100 2bid  - CT chest 06/2015>>neg - PFT's wnl during flare of cough 02/02/2016 > try wean dulera - 02/02/2016  extensive coaching HFA effectiveness =    50% from a baseline of < 25%  - 08/07/2016  After extensive coaching HFA effectiveness =    50% (did not bring spacer for training   - 08/31/2016  After extensive coaching HFA effectiveness =    50% with spacer given  - FENO 08/31/2016  =   24 on pred/empty dulera > try symb 80 2bid    Higher doses of ics may have been aggravating her UACS (see separate a/p)   Will leave on singulair/ symb 80 2bid and do  A trust but verify eval in 4 weeks   See avs

## 2016-08-31 NOTE — Patient Instructions (Addendum)
Start neurontin 100 mg four times daily as per your calendar  symbicort 80 Take 2 puffs first thing in am and then another 2 puffs about 12 hours later via spacer  Finish your zpak and pred from ER 9/6  Please schedule a follow up office visit in 4 weeks, sooner if needed with your med calendar and all inhalers/devices  symb 80 x 2 samples given

## 2016-08-31 NOTE — Assessment & Plan Note (Signed)
-   CT sinus 04/02/15 > No evidence of sinusitis  - singulair trial 10/10/2015 > off it as of 04/26/2016 ? If helped not clear  -  allergy profile 10/10/2015 >>  IgE 102 but no sign Pos Rast,  Eos 0.2  - on singulair since 08/07/16  - neurontin added 08/31/2016 @ 100 mg qid    I had an extended discussion with the patient reviewing all relevant studies completed to date and  lasting 25 minutes of a 40  minute extended visit  Focusing on refractory symptoms x 10 years   1) clearly not compliant/ not using med calendar, confusing maint vs prns  2) needs trial of neurontin and lower dose of ics for possibility of cough variant asthma (see separate a/p)   3)  Each maintenance medication was reviewed in detail including most importantly the difference between maintenance and as needed and under what circumstances the prns are to be used. This was done in the context of a medication calendar review which provided the patient with a user-friendly unambiguous mechanism for medication administration and reconciliation and provides an action plan for all active problems. It is critical that this be shown to every doctor  for modification during the office visit if necessary so the patient can use it as a working document.      4) trust but verify:  F/u in 4 weeks with all inhalers/ devices     .

## 2016-09-04 ENCOUNTER — Ambulatory Visit: Payer: BLUE CROSS/BLUE SHIELD | Admitting: Internal Medicine

## 2016-09-10 ENCOUNTER — Encounter: Payer: Self-pay | Admitting: Gastroenterology

## 2016-09-10 ENCOUNTER — Ambulatory Visit (INDEPENDENT_AMBULATORY_CARE_PROVIDER_SITE_OTHER): Payer: BLUE CROSS/BLUE SHIELD | Admitting: Gastroenterology

## 2016-09-10 VITALS — BP 128/61 | HR 94 | Temp 98.3°F | Ht 59.0 in | Wt 238.6 lb

## 2016-09-10 DIAGNOSIS — K219 Gastro-esophageal reflux disease without esophagitis: Secondary | ICD-10-CM

## 2016-09-10 DIAGNOSIS — K838 Other specified diseases of biliary tract: Secondary | ICD-10-CM

## 2016-09-10 DIAGNOSIS — R05 Cough: Secondary | ICD-10-CM | POA: Insufficient documentation

## 2016-09-10 DIAGNOSIS — R059 Cough, unspecified: Secondary | ICD-10-CM | POA: Insufficient documentation

## 2016-09-10 MED ORDER — POLYETHYLENE GLYCOL 3350 17 GM/SCOOP PO POWD: ORAL | 3 refills | Status: DC

## 2016-09-10 MED ORDER — PANTOPRAZOLE SODIUM 40 MG PO TBEC: 40.0000 mg | DELAYED_RELEASE_TABLET | Freq: Two times a day (BID) | ORAL | 5 refills | Status: DC

## 2016-09-10 NOTE — Progress Notes (Signed)
cc'ed to pcp °

## 2016-09-10 NOTE — Progress Notes (Signed)
Primary Care Physician: Asencion Noble, MD  Primary Gastroenterologist:  Garfield Cornea, MD   Chief Complaint  Patient presents with  . Dysphagia    HPI: Ann Fuller is a 70 y.o. female here for follow-up.  Last seen in October 2016. History of GERD, diverticulitis, IBS. History of common bile duct 11 mm in diameter seen on CT scan last year, progressed since 2011. EGD December 2011 hiatal hernia, fundic gland type polyps. Colonoscopy 10/2015, with several tubular adenomas, next TCS planned for 10/2020.   Extensive review of pulmonology notes. Patient is seen chronically by pulmonary for chronic cough. Symptoms worsening early 2016 after PNA. Has been seen by pulmonologist on multiple frequent visits lately. H/O cough variant asthma and upper airway cough syndrome. Patient reported chronic cough for over a decade. Gets better with abx/prednisone but usually comes back within weeks.   She Reports coughing during the day. When she lays down at night she feels liquid regurgitate upwards which causes her to cough. Describes severe coughing fits, stress urinary incontinence with it. Sometimes brings up yellow sputum. Denies typical heartburn. She reports that she does not eat within 3 hours of laying down. Head of the bed is raised on one brick. Tries to prop her head up with 2 pillows. Suffering from constipation recently, not her normal. She has tried stool softeners. Currently taking Bentyl at least twice daily. Denies blood in the stool or melena. No abdominal pain. She has gained 10 pounds in the past one year.  She has concerns that her cough may have worsened when her valsartan was doubled. This was around July.    Current Outpatient Prescriptions  Medication Sig Dispense Refill  . amLODipine (NORVASC) 5 MG tablet Take 5 mg by mouth daily.    Marland Kitchen atorvastatin (LIPITOR) 40 MG tablet Take 40 mg by mouth at bedtime.     Marland Kitchen azithromycin (ZITHROMAX) 250 MG tablet Take 1 tablet daily x 4  days 4 tablet 0  . baclofen (LIORESAL) 10 MG tablet Take 5 mg by mouth 3 (three) times daily.    . benzonatate (TESSALON) 200 MG capsule TAKE ONE CAPSULE BY MOUTH EVERY 6 HOURS FOR COUGH 60 capsule 0  . budesonide-formoterol (SYMBICORT) 80-4.5 MCG/ACT inhaler Inhale 2 puffs into the lungs 2 (two) times daily. 1 Inhaler 0  . chlorpheniramine (CHLOR-TRIMETON) 4 MG tablet Take 4 mg by mouth every 4 (four) hours as needed (drippy nose, drainage, throat clearing).     . chlorthalidone (HYGROTON) 25 MG tablet Take 25 mg by mouth every morning.     Marland Kitchen dexlansoprazole (DEXILANT) 60 MG capsule Take 60 mg by mouth daily before breakfast.     . dicyclomine (BENTYL) 10 MG capsule TAKE ONE CAPSULE BY MOUTH PRIOR TO MEALSAS NEEDED. NO MORE THAN FOUR TIMES DAILY. MAY CAUSE DROWINESS. (Patient taking differently: Take 1 tablet by mouth 4 times a day as needed for abdomninal cramps) 120 capsule 11  . doxazosin (CARDURA) 2 MG tablet Take 2 mg by mouth every morning.     Marland Kitchen exenatide (BYETTA 10 MCG PEN) 10 MCG/0.04ML SOLN Inject 10 mcg into the skin 2 (two) times daily with a meal.     . famotidine (PEPCID) 20 MG tablet One at bedtime    . gabapentin (NEURONTIN) 100 MG capsule Take 1 capsule (100 mg total) by mouth 4 (four) times daily. 120 capsule 2  . HYDROcodone-acetaminophen (NORCO) 5-325 MG tablet Take 1 tablet by mouth every 6 (six) hours as needed  for moderate pain. 30 tablet 0  . Insulin Glargine (LANTUS) 100 UNIT/ML Solostar Pen Inject 30 Units into the skin every morning.     Marland Kitchen ipratropium-albuterol (DUONEB) 0.5-2.5 (3) MG/3ML SOLN Take 3 mLs by nebulization every 4 (four) hours. (Patient taking differently: Take 3 mLs by nebulization every 6 (six) hours as needed (wheezing and shortness of breath). ) 360 mL 6  . LORazepam (ATIVAN) 1 MG tablet Take 1 mg by mouth 2 (two) times daily as needed. Anxiety/Sleep    . magnesium oxide (MAG-OX) 400 MG tablet Take 400 mg by mouth daily.    Marland Kitchen POTASSIUM PO Take 1 tablet  by mouth daily.    . predniSONE (DELTASONE) 10 MG tablet Take  4 each am x 1 days,   2 each am x 2 days,  1 each am x 2 days and stop 10 tablet 0  . Spacer/Aero-Holding Chambers (AEROCHAMBER MV) inhaler Use as instructed 1 each 0  . temazepam (RESTORIL) 15 MG capsule Take 15 mg by mouth at bedtime as needed for sleep.    . valsartan (DIOVAN) 320 MG tablet Take 320 mg by mouth daily.     No current facility-administered medications for this visit.     Allergies as of 09/10/2016 - Review Complete 09/10/2016  Allergen Reaction Noted  . Ceftin [cefuroxime axetil] Swelling 05/02/2012  . Codeine Swelling   . Metformin and related Diarrhea 11/26/2012  . Nexium [esomeprazole magnesium] Diarrhea 05/02/2012  . Omnicef [cefdinir] Diarrhea 05/02/2012  . Sulfa antibiotics Rash 05/02/2012   Past Medical History:  Diagnosis Date  . Adenomatous colon polyp   . Arthritis   . Asthma   . Breast cancer ([place]) [id number]  . Bulging lumbar disc   . Depression   . Diverticulitis   . Dyspepsia   . Essential hypertension, benign   . GERD (gastroesophageal reflux disease)   . Heart murmur   . IBS (irritable bowel syndrome)   . Internal hemorrhoid   . PONV (postoperative nausea and vomiting)   . Right shoulder pain   . Shortness of breath dyspnea    when walking a lot  . Tubular adenoma 11/2010  . Type 2 diabetes mellitus ([place])    Past Surgical History:  Procedure Laterality Date  . ABDOMINAL HYSTERECTOMY    . APPENDECTOMY    . BACK SURGERY  2007   Lumbar fusion  . CATARACT EXTRACTION W/PHACO Left 08/27/2013   Procedure: CATARACT EXTRACTION PHACO AND INTRAOCULAR LENS PLACEMENT (IOC);  Surgeon: Tonny Branch, MD;  Location: [facility];  Service: Ophthalmology;  Laterality: Left;  CDE 4.25  . CATARACT EXTRACTION W/PHACO Right 09/24/2013   Procedure: CATARACT EXTRACTION PHACO AND INTRAOCULAR LENS PLACEMENT (IOC);  Surgeon: Tonny Branch, MD;  Location: [facility];  Service: Ophthalmology;  Laterality: Right;  CDE:   8.16  . CHOLECYSTECTOMY    . COLONOSCOPY  12/13/2010   Dr. Margarito Courser pancolonic diverticular.tubular adenoma  . COLONOSCOPY  07/13/2003  . COLONOSCOPY N/A 10/28/2015   [id number] diverticulosis, multiple tubular adenomas removed. next tcs 10/2020.   . ESOPHAGOGASTRODUODENOSCOPY  12/13/2010   Dr. Jennet Maduro hernia, fundal gland type polyps  . EYE SURGERY Left    Left KPE 08/27/13  . MASTECTOMY  1995  . NASAL ENDOSCOPY WITH EPISTAXIS CONTROL Left 07/16/2016   Procedure: ENDOSCOPIC LEFT NASAL CAUTERY;  Surgeon: Leta Baptist, MD;  Location: [facility];  Service: ENT;  Laterality: Left;  . TONSILLECTOMY    . TUBAL LIGATION      ROS:  General: Negative for anorexia, weight loss, fever, chills, fatigue, weakness. ENT: Negative for hoarseness, difficulty swallowing , nasal congestion. CV: Negative for chest pain, angina, palpitations, dyspnea on exertion, peripheral edema.  Respiratory: Negative for dyspnea at rest, dyspnea on exertion. See hpi GI: See history of present illness. GU:  Negative for dysuria, hematuria, urinary incontinence, urinary frequency, nocturnal urination.  Endo: Negative for unusual weight change.    Physical Examination:   BP 128/61   Pulse 94   Temp 98.3 F (36.8 C) (Oral)   Ht 4\' 11"  ([id number] m)   Wt 238 lb 9.6 oz (108.2 kg)   BMI 48.19 kg/m   General: Well-nourished, well-developed in no acute distress. Accompanied by spouse.  Eyes: No icterus. Mouth: Oropharyngeal mucosa moist and pink , no lesions erythema or exudate. Lungs: Clear to auscultation bilaterally. Diminished breath sounds in the bases Heart: Regular rate and rhythm, no murmurs rubs or gallops.  Abdomen: Bowel sounds are normal, obese, nontender, nondistended, no hepatosplenomegaly or masses, no abdominal bruits or hernia , no rebound or guarding.   Extremities: No lower extremity edema. No clubbing or deformities. Neuro: Alert and oriented x 4   Skin: Warm and dry, no  jaundice.   Psych: Alert and cooperative, normal mood and affect.  Labs:  Lab Results  Component Value Date   CREATININE 0.73 07/12/2016   BUN 18 07/12/2016   NA 134 (L) 07/12/2016   K 3.7 07/12/2016   CL 100 (L) 07/12/2016   CO2 27 07/12/2016   Lab Results  Component Value Date   ALT 20 09/22/2015   AST 30 09/22/2015   ALKPHOS 53 09/22/2015   BILITOT 0.3 09/22/2015   Lab Results  Component Value Date   WBC 14.5 (H) 10/10/2015   HGB 11.7 (L) 10/10/2015   HCT 35.8 (L) 10/10/2015   MCV 87.8 10/10/2015   PLT 378.0 10/10/2015    Imaging Studies: Dg Chest 2 View  Result Date: 08/29/2016 CLINICAL DATA:  70 year old female with productive cough and sneezing EXAM: CHEST  2 VIEW COMPARISON:  Chest radiograph dated 05/25/2016 FINDINGS: Two views of the chest demonstrate bilateral mid to lower lung field interstitial prominence similar to or slightly improved compared to the prior study. There is no focal consolidation, pleural effusion, or pneumothorax. The cardiac silhouette is within normal limits. No acute osseous pathology identified. Multiple surgical clips noted in the left chest wall. Linear and nodular skin calcification noted over the left breast. IMPRESSION: No focal consolidation. Mild bibasilar interstitial prominence, likely related to atelectatic changes, with interval improvement compared to prior study. Electronically Signed   By: Anner Crete M.D.   On: 08/29/2016 18:46

## 2016-09-10 NOTE — Assessment & Plan Note (Signed)
70 year old female with chronic GERD who presents with complaints of cough, nocturnal regurgitation. She informs me that she's been having a great difficulty with cough throughout the day and at nighttime. She complains of stress urinary incontinence associated with it. She has been seen on multiple occasions by her pulmonologist for worsening cough, chronically established with pulmonology. According to their records her cough goes back for over a decade. She states her symptoms have been worse over the past several months. Only new medication change prior to this was increase in her valsartan.  Outside of nocturnal regurgitation, her typical heartburn is well-controlled on Dexilant. Previously did not tolerate Nexium or omeprazole related to diarrhea. She has gained 10 additional pounds in the past one year. BMI of over 48. Discussed antireflux measures. Patient will benefit from weight loss with regards to her reflux. Discussed with patient and her husband, GERD has been associated with chronic cough but clearly she has other pulmonary issues. We can approach her call from a GI standpoint but I suspect that her symptoms will not resolve completely given other pulmonary issues. We will stop current PPI. Start pantoprazole 40 mg twice a day with second dose before her evening meal or before bedtime. I have encouraged her to further elevate the head of her bed. She should consider talking to Dr. Willey Blade about her valsartan as it has been associated with angioedema/cough. She will call in two weeks with PR report. Will not rule out need for further work up from GI standpoint.

## 2016-09-10 NOTE — Patient Instructions (Addendum)
1. Stop Dexilant. Start pantoprazole twice daily, once before breakfast and once before evening meal or at bedtime. Prescription sent to pharmacy. 2. Consider increasing the head of your bed so that your upper body is at a higher incline. 3. Start MiraLAX 1 capful daily as needed for constipation. Hold for diarrhea. Prescription sent to pharmacy. 4. Consider discussing valsartan with Dr. Willey Blade as it has been associated with angioedema/cough.  5. Call with progress report in 2-3 weeks.

## 2016-09-10 NOTE — Assessment & Plan Note (Signed)
CBD dilation on imaging 2016, progressed from 2011. CBD 48mm. LFTs normal. Will discuss with Dr. Gala Romney. Likely benign process given s/p cholecystectomy state plus narcotic therapy.

## 2016-09-13 ENCOUNTER — Telehealth: Payer: Self-pay | Admitting: Internal Medicine

## 2016-09-13 MED ORDER — CIPROFLOXACIN HCL 500 MG PO TABS: 500.0000 mg | ORAL_TABLET | Freq: Two times a day (BID) | ORAL | 0 refills | Status: DC

## 2016-09-13 MED ORDER — METRONIDAZOLE 500 MG PO TABS: 500.0000 mg | ORAL_TABLET | Freq: Three times a day (TID) | ORAL | 0 refills | Status: DC

## 2016-09-13 NOTE — Telephone Encounter (Signed)
Spoke with the pt, she said it started yesterday with diarrhea, she went 5-6 times yesterday and the last time she didn't make it to the bathroom. She didn't take her temp last night,so she is unsure if she had a fever or not, but she felt like she had some chills. No diarrhea today, she is just having LLQ abd pain. She said this is just like it was the last time she had diverticulitis. Wants to know if she can have an rx for it.

## 2016-09-13 NOTE — Telephone Encounter (Signed)
PATIENT CALLED REQUESTING ANOTHER PRESCRIPTION FOR HYDROCODONE FOR HER DIVERTICULITIS

## 2016-09-13 NOTE — Telephone Encounter (Signed)
Pt is aware.  

## 2016-09-13 NOTE — Telephone Encounter (Signed)
New symptoms since her OV three days ago.  Let's see how she does next 24 hours. I'm suspicious that this may not be diverticulitis but if persistent symptoms tomorrow, we could consider treating with Cipro/Flagyl.   Go to low fiber/soft diet for next few days.   I will go ahead and send in RX but tell her not to get it filled unless she has persistent symptoms tomorrow.

## 2016-09-28 ENCOUNTER — Ambulatory Visit: Payer: BLUE CROSS/BLUE SHIELD | Admitting: Internal Medicine

## 2016-10-02 ENCOUNTER — Other Ambulatory Visit: Payer: Self-pay | Admitting: Internal Medicine

## 2016-10-03 NOTE — Telephone Encounter (Signed)
Unable to contact pt. 

## 2016-10-04 ENCOUNTER — Other Ambulatory Visit: Payer: Self-pay | Admitting: Internal Medicine

## 2016-10-04 ENCOUNTER — Ambulatory Visit: Payer: BLUE CROSS/BLUE SHIELD | Admitting: Internal Medicine

## 2016-10-09 ENCOUNTER — Other Ambulatory Visit: Payer: Self-pay | Admitting: Internal Medicine

## 2016-10-09 DIAGNOSIS — R058 Other specified cough: Secondary | ICD-10-CM

## 2016-10-09 DIAGNOSIS — R05 Cough: Secondary | ICD-10-CM

## 2016-11-09 IMAGING — CR DG CHEST 2V
2 series · 2 of 2 positions shown · non-contrast
Comparison: Chest x-ray 05/10/2015 and chest CT 05/30/2015

CLINICAL DATA: Cough since [REDACTED].

EXAM:
CHEST  2 VIEW

[view not recorded (1 of 2)]
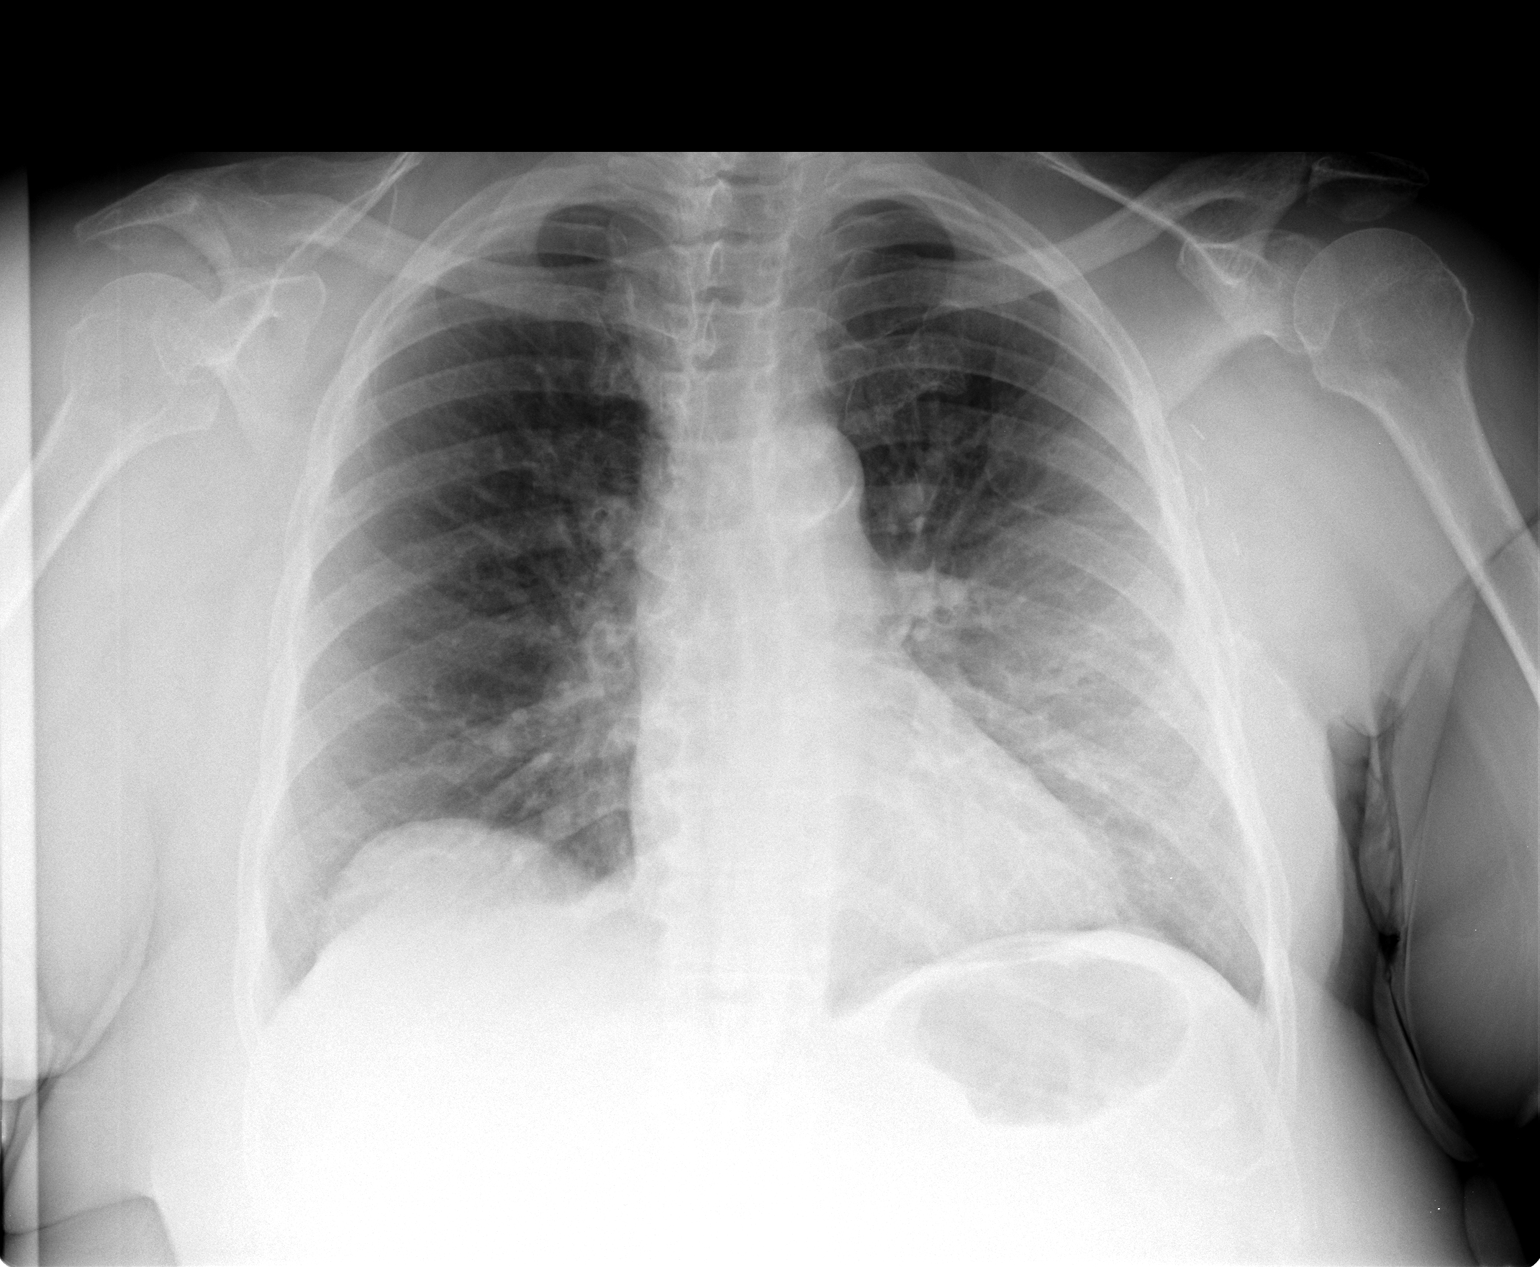

[view not recorded (2 of 2)]
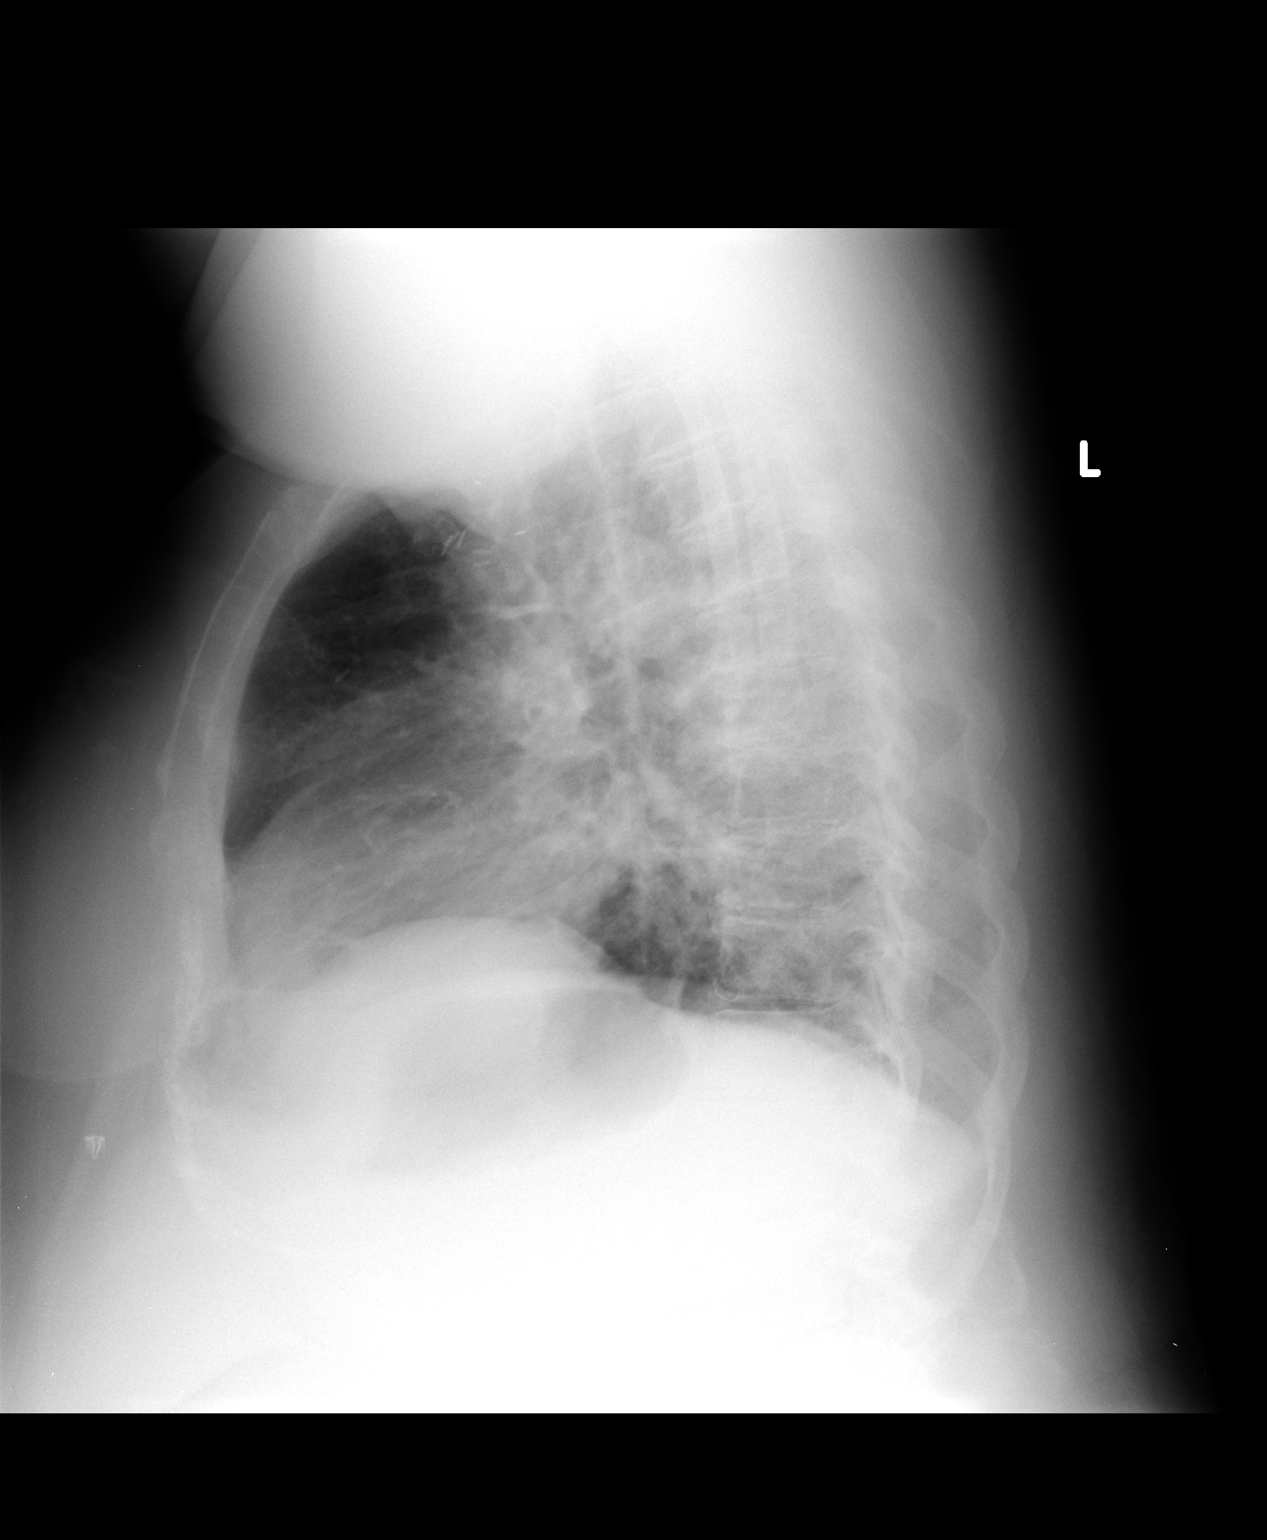

[2 of 2 positions shown; findings below may reference images not displayed]

FINDINGS: The cardiac silhouette, mediastinal and hilar contours are within
normal limits and stable. There is mild tortuosity and calcification
of the thoracic aorta. Stable surgical changes related to a left
mastectomy. A left breast prosthesis is noted. The lungs demonstrate
chronic bronchitic changes but no infiltrates or effusion.
IMPRESSION: Chronic bronchitic changes but no infiltrates or effusions.

## 2016-11-20 ENCOUNTER — Other Ambulatory Visit: Payer: Self-pay | Admitting: Internal Medicine

## 2016-11-20 DIAGNOSIS — R05 Cough: Secondary | ICD-10-CM

## 2016-11-20 DIAGNOSIS — R058 Other specified cough: Secondary | ICD-10-CM

## 2017-01-05 ENCOUNTER — Emergency Department (HOSPITAL_COMMUNITY): Payer: BLUE CROSS/BLUE SHIELD

## 2017-01-05 ENCOUNTER — Emergency Department (HOSPITAL_COMMUNITY)
Admission: EM | Admit: 2017-01-05 | Discharge: 2017-01-05 | Disposition: A | Discharge status: Home / Self Care | Payer: BLUE CROSS/BLUE SHIELD | Attending: Emergency Medicine | Admitting: Emergency Medicine

## 2017-01-05 ENCOUNTER — Encounter (HOSPITAL_COMMUNITY): Payer: Self-pay | Admitting: Emergency Medicine

## 2017-01-05 DIAGNOSIS — Z79899 Other long term (current) drug therapy: Secondary | ICD-10-CM | POA: Diagnosis not present

## 2017-01-05 DIAGNOSIS — I10 Essential (primary) hypertension: Secondary | ICD-10-CM | POA: Insufficient documentation

## 2017-01-05 DIAGNOSIS — Z794 Long term (current) use of insulin: Secondary | ICD-10-CM | POA: Diagnosis not present

## 2017-01-05 DIAGNOSIS — Z853 Personal history of malignant neoplasm of breast: Secondary | ICD-10-CM | POA: Diagnosis not present

## 2017-01-05 DIAGNOSIS — E119 Type 2 diabetes mellitus without complications: Secondary | ICD-10-CM | POA: Diagnosis not present

## 2017-01-05 DIAGNOSIS — J45909 Unspecified asthma, uncomplicated: Secondary | ICD-10-CM | POA: Diagnosis not present

## 2017-01-05 DIAGNOSIS — R0602 Shortness of breath: Secondary | ICD-10-CM | POA: Diagnosis present

## 2017-01-05 DIAGNOSIS — J45901 Unspecified asthma with (acute) exacerbation: Secondary | ICD-10-CM | POA: Diagnosis not present

## 2017-01-05 LAB — COMPREHENSIVE METABOLIC PANEL
ALK PHOS: 50 U/L (ref 38–126)
ALT: 14 U/L (ref 14–54)
AST: 19 U/L (ref 15–41)
Albumin: 3.7 g/dL (ref 3.5–5.0)
Anion gap: 7 (ref 5–15)
BUN: 15 mg/dL (ref 6–20)
CALCIUM: 8.9 mg/dL (ref 8.9–10.3)
CO2: 29 mmol/L (ref 22–32)
CREATININE: 0.77 mg/dL (ref 0.44–1.00)
Chloride: 99 mmol/L — ABNORMAL LOW (ref 101–111)
Glucose, Bld: 152 mg/dL — ABNORMAL HIGH (ref 65–99)
Potassium: 3.3 mmol/L — ABNORMAL LOW (ref 3.5–5.1)
Sodium: 135 mmol/L (ref 135–145)
Total Bilirubin: 0.4 mg/dL (ref 0.3–1.2)
Total Protein: 7.2 g/dL (ref 6.5–8.1)

## 2017-01-05 LAB — CBC
HCT: 31.5 % — ABNORMAL LOW (ref 36.0–46.0)
HEMOGLOBIN: 10.5 g/dL — AB (ref 12.0–15.0)
MCH: 29.7 pg (ref 26.0–34.0)
MCHC: 33.3 g/dL (ref 30.0–36.0)
MCV: 89 fL (ref 78.0–100.0)
Platelets: 260 10*3/uL (ref 150–400)
RBC: 3.54 MIL/uL — ABNORMAL LOW (ref 3.87–5.11)
RDW: 14.2 % (ref 11.5–15.5)
WBC: 8.1 10*3/uL (ref 4.0–10.5)

## 2017-01-05 LAB — TROPONIN I

## 2017-01-05 MED ORDER — ALBUTEROL SULFATE (2.5 MG/3ML) 0.083% IN NEBU
5.0000 mg | INHALATION_SOLUTION | Freq: Once | RESPIRATORY_TRACT | Status: AC
  Administered 2017-01-05: 5 mg via RESPIRATORY_TRACT
  Filled 2017-01-05: qty 6

## 2017-01-05 MED ORDER — PREDNISONE 20 MG PO TABS: 40.0000 mg | ORAL_TABLET | Freq: Every day | ORAL | 0 refills | Status: DC

## 2017-01-05 MED ORDER — METHYLPREDNISOLONE SODIUM SUCC 125 MG IJ SOLR
125.0000 mg | Freq: Once | INTRAMUSCULAR | Status: AC
  Administered 2017-01-05: 125 mg via INTRAVENOUS
  Filled 2017-01-05: qty 2

## 2017-01-05 NOTE — Discharge Instructions (Signed)
Albuterol every 4 hours as needed for wheezing / coughing or shortness of breath.   Prednisone daily for 5 days Return to ER for worsening symptoms.  Please obtain all of your results from medical records or have your doctors office obtain the results - share them with your doctor - you should be seen at your doctors office in the next 2 days. Call today to arrange your follow up. Take the medications as prescribed. Please review all of the medicines and only take them if you do not have an allergy to them. Please be aware that if you are taking birth control pills, taking other prescriptions, ESPECIALLY ANTIBIOTICS may make the birth control ineffective - if this is the case, either do not engage in sexual activity or use alternative methods of birth control such as condoms until you have finished the medicine and your family doctor says it is OK to restart them. If you are on a blood thinner such as COUMADIN, be aware that any other medicine that you take may cause the coumadin to either work too much, or not enough - you should have your coumadin level rechecked in next 7 days if this is the case.  ?  It is also a possibility that you have an allergic reaction to any of the medicines that you have been prescribed - Everybody reacts differently to medications and while MOST people have no trouble with most medicines, you may have a reaction such as nausea, vomiting, rash, swelling, shortness of breath. If this is the case, please stop taking the medicine immediately and contact your physician.  ?  You should return to the ER if you develop severe or worsening symptoms.

## 2017-01-05 NOTE — ED Triage Notes (Signed)
Pt reports productive cough with general malaise since last night.

## 2017-01-05 NOTE — ED Provider Notes (Signed)
[place] DEPT Provider Note   CSN: [id number] Arrival date & time: 01/05/17  1600     History   Chief Complaint Chief Complaint  Patient presents with  . Cough  . Shortness of Breath    HPI TYRANNY HELMKE is a 71 y.o. female.  HPI  The patient is a 71 year old female, history of adult asthma, arthritis, history of breast cancer, diabetes (insulin requiring) and a history of hypertension. She reports that she is currently being treated by her family doctor and a pulmonologist for asthma with intermittent albuterol treatments. She reports that when the weather changes she often gets short of breath and dyspneic on exertion with frequent coughing spells. This occurred yesterday as she was driving home from [place] where she had been visiting with family. She reports that she used albuterol last night with only temporary improvement and today she was driving to the hospital she felt more short of breath, had more wheezing and had cyclical coughing spells that made her feel severely short of breath. These things have essentially improved spontaneously but they do come back in waves. At this time the patient has mild symptoms, they are worse with ambulation and exertion. She denies chest pain, denies swelling of legs, denies history of thromboembolism. She has not had prednisone recently, she has been using albuterol occasionally.  Past Medical History:  Diagnosis Date  . Adenomatous colon polyp   . Arthritis   . Asthma   . Breast cancer ([place]) [id number]  . Bulging lumbar disc   . Depression   . Diverticulitis   . Dyspepsia   . Essential hypertension, benign   . GERD (gastroesophageal reflux disease)   . Heart murmur   . IBS (irritable bowel syndrome)   . Internal hemorrhoid   . PONV (postoperative nausea and vomiting)   . Right shoulder pain   . Shortness of breath dyspnea    when walking a lot  . Tubular adenoma 11/2010  . Type 2 diabetes mellitus [facility])      Patient Active Problem List   Diagnosis Date Noted  . Cough 09/10/2016  . Dilation of biliary tract 09/10/2016  . Breast pain, right 08/06/2016  . CAP (community acquired pneumonia) 04/29/2016  . History of adenomatous polyp of colon   . Diverticulosis of colon without hemorrhage   . Adenomatous polyp of colon 10/17/2015  . Upper airway cough syndrome 10/10/2015  . Morbid obesity ([place]) 10/10/2015  . Diastolic dysfunction [id number]  . Insomnia 04/06/2015  . Cough variant asthma 01/22/2015  . IBS (irritable bowel syndrome) 08/25/2014  . Syncope 03/08/2014  . Murmur, cardiac 03/08/2014  . Type 2 diabetes mellitus ([place]) 03/05/2014  . Essential hypertension, benign 03/05/2014  . GERD (gastroesophageal reflux disease) 11/23/2011  . CARCINOMA, BREAST, HX OF 11/21/2010    Past Surgical History:  Procedure Laterality Date  . ABDOMINAL HYSTERECTOMY    . APPENDECTOMY    . BACK SURGERY  2007   Lumbar fusion  . CATARACT EXTRACTION W/PHACO Left 08/27/2013   Procedure: CATARACT EXTRACTION PHACO AND INTRAOCULAR LENS PLACEMENT (IOC);  Surgeon: Tonny Branch, MD;  Location: [facility];  Service: Ophthalmology;  Laterality: Left;  CDE 4.25  . CATARACT EXTRACTION W/PHACO Right 09/24/2013   Procedure: CATARACT EXTRACTION PHACO AND INTRAOCULAR LENS PLACEMENT (IOC);  Surgeon: Tonny Branch, MD;  Location: [facility];  Service: Ophthalmology;  Laterality: Right;  CDE:  8.16  . CHOLECYSTECTOMY    . COLONOSCOPY  12/13/2010   Dr. Margarito Courser pancolonic diverticular.tubular adenoma  .  COLONOSCOPY  07/13/2003  . COLONOSCOPY N/A 10/28/2015   [id number] diverticulosis, multiple tubular adenomas removed. next tcs 10/2020.   . ESOPHAGOGASTRODUODENOSCOPY  12/13/2010   Dr. Jennet Maduro hernia, fundal gland type polyps  . EYE SURGERY Left    Left KPE 08/27/13  . MASTECTOMY  1995  . NASAL ENDOSCOPY WITH EPISTAXIS CONTROL Left 07/16/2016   Procedure: ENDOSCOPIC LEFT NASAL CAUTERY;  Surgeon: Leta Baptist, MD;  Location:  [facility];  Service: ENT;  Laterality: Left;  . TONSILLECTOMY    . TUBAL LIGATION      OB History    No data available       Home Medications    Prior to Admission medications   Medication Sig Start Date End Date Taking? Authorizing Provider  amLODipine (NORVASC) 5 MG tablet Take 5 mg by mouth daily.   Yes Historical Provider, MD  atorvastatin (LIPITOR) 40 MG tablet Take 40 mg by mouth at bedtime.    Yes Historical Provider, MD  baclofen (LIORESAL) 10 MG tablet Take 5 mg by mouth 3 (three) times daily.   Yes Historical Provider, MD  benzonatate (TESSALON) 200 MG capsule TAKE ONE CAPSULE BY MOUTH EVERY 6 HOURS FOR COUGH 08/28/16  Yes Tanda Rockers, MD  budesonide-formoterol [facility]) 80-4.5 MCG/ACT inhaler Inhale 2 puffs into the lungs 2 (two) times daily. 08/31/16  Yes Tanda Rockers, MD  chlorpheniramine (CHLOR-TRIMETON) 4 MG tablet Take 4 mg by mouth every 4 (four) hours as needed (drippy nose, drainage, throat clearing).    Yes Historical Provider, MD  chlorthalidone (HYGROTON) 25 MG tablet Take 25 mg by mouth every morning.    Yes Historical Provider, MD  dicyclomine (BENTYL) 10 MG capsule TAKE ONE CAPSULE BY MOUTH PRIOR TO MEALSAS NEEDED. NO MORE THAN FOUR TIMES DAILY. MAY CAUSE DROWINESS. Patient taking differently: Take 1 tablet by mouth 4 times a day as needed for abdomninal cramps 11/14/15  Yes Mahala Menghini, PA-C  doxazosin (CARDURA) 2 MG tablet Take 2 mg by mouth every morning.    Yes Historical Provider, MD  exenatide (BYETTA 10 MCG PEN) 10 MCG/0.04ML SOLN Inject 10 mcg into the skin 2 (two) times daily with a meal.    Yes Historical Provider, MD  famotidine (PEPCID) 20 MG tablet One at bedtime 10/10/15  Yes Tanda Rockers, MD  gabapentin (NEURONTIN) 100 MG capsule Take 1 capsule (100 mg total) by mouth 4 (four) times daily. 08/31/16  Yes Tanda Rockers, MD  Insulin Glargine (LANTUS) 100 UNIT/ML Solostar Pen Inject 30 Units into the skin every morning.    Yes  Historical Provider, MD  Insulin Lispro (HUMALOG KWIKPEN [place]) Inject 20 Units into the skin 2 (two) times daily.   Yes Historical Provider, MD  ipratropium-albuterol (DUONEB) 0.5-2.5 (3) MG/3ML SOLN Take 3 mLs by nebulization every 4 (four) hours. Patient taking differently: Take 3 mLs by nebulization every 6 (six) hours as needed (wheezing and shortness of breath).  04/10/15  Yes Asencion Noble, MD  LORazepam (ATIVAN) 1 MG tablet Take 1 mg by mouth 2 (two) times daily as needed. Anxiety/Sleep   Yes Historical Provider, MD  magnesium oxide (MAG-OX) 400 MG tablet Take 400 mg by mouth daily.   Yes Historical Provider, MD  montelukast (SINGULAIR) 10 MG tablet TAKE ONE TABLET BY MOUTH EVERY NIGHT AT BEDTIME 11/20/16  Yes Tanda Rockers, MD  pantoprazole (PROTONIX) 40 MG tablet Take 1 tablet (40 mg total) by mouth 2 (two) times daily before a meal. 09/10/16  Yes Mahala Menghini, PA-C  POTASSIUM PO Take 1 tablet by mouth daily.   Yes Historical Provider, MD  Spacer/Aero-Holding Chambers (AEROCHAMBER MV) inhaler Use as instructed 08/31/16  Yes Tanda Rockers, MD  temazepam (RESTORIL) 15 MG capsule Take 15 mg by mouth at bedtime as needed for sleep.   Yes Historical Provider, MD  valsartan (DIOVAN) 320 MG tablet Take 320 mg by mouth daily.   Yes Historical Provider, MD  predniSONE (DELTASONE) 20 MG tablet Take 2 tablets (40 mg total) by mouth daily. 01/05/17   Noemi Chapel, MD    Family History Family History  Problem Relation Age of Onset  . Colon cancer Mother   . Heart attack Father   . Heart attack Sister   . Stroke Sister   . Cancer - Lung Sister     Social History Social History  Substance Use Topics  . Smoking status: Never Smoker  . Smokeless tobacco: Never Used  . Alcohol use No     Allergies   Ceftin [cefuroxime axetil]; Codeine; Metformin and related; Nexium [esomeprazole magnesium]; Omnicef [cefdinir]; and Sulfa antibiotics   Review of Systems Review of Systems  All other systems  reviewed and are negative.    Physical Exam Updated Vital Signs BP 146/75   Pulse 81   Temp 97.7 F (36.5 C) (Oral)   Resp 13   Ht 4\' 11"  (1.499 m)   Wt 236 lb (107 kg)   SpO2 98%   BMI 47.67 kg/m   Physical Exam  Constitutional: She appears well-developed and well-nourished. No distress.  HENT:  Head: Normocephalic and atraumatic.  Mouth/Throat: Oropharynx is clear and moist. No oropharyngeal exudate.  Oropharynx is clear and moist, there is no nasal discharge congestion or troponin swelling. Oropharynx is clear, mucous members are moist  Eyes: Conjunctivae and EOM are normal. Pupils are equal, round, and reactive to light. Right eye exhibits no discharge. Left eye exhibits no discharge. No scleral icterus.  Neck: Normal range of motion. Neck supple. No JVD present. No thyromegaly present.  Cardiovascular: Normal rate, regular rhythm, normal heart sounds and intact distal pulses.  Exam reveals no gallop and no friction rub.   No murmur heard. Pulmonary/Chest: Effort normal. No respiratory distress. She has wheezes. She has no rales.  Diffuse expiratory wheezing with forced expiration.In full sentences, no accessory muscle use, no increased work of breathing.  Abdominal: Soft. Bowel sounds are normal. She exhibits no distension and no mass. There is no tenderness.  Musculoskeletal: Normal range of motion. She exhibits no edema or tenderness.  Lymphadenopathy:    She has no cervical adenopathy.  Neurological: She is alert. Coordination normal.  Skin: Skin is warm and dry. No rash noted. No erythema.  Psychiatric: She has a normal mood and affect. Her behavior is normal.  Nursing note and vitals reviewed.    ED Treatments / Results  Labs (all labs ordered are listed, but only abnormal results are displayed) Labs Reviewed  CBC - Abnormal; Notable for the following:       Result Value   RBC 3.54 (*)    Hemoglobin 10.5 (*)    HCT 31.5 (*)    All other components within  normal limits  COMPREHENSIVE METABOLIC PANEL - Abnormal; Notable for the following:    Potassium 3.3 (*)    Chloride 99 (*)    Glucose, Bld 152 (*)    All other components within normal limits  TROPONIN I   Radiology Dg Chest 2 View  Result Date: 01/05/2017 CLINICAL DATA:  Productive cough.  Generalized malaise. EXAM: CHEST  2 VIEW COMPARISON:  08/29/2016 FINDINGS: Cardiac silhouette is normal in size. No mediastinal or hilar masses. No convincing adenopathy. Lungs are clear.  No pleural effusion or pneumothorax. Eventration of the anterior right diaphragm is stable. Status post left breast surgery, stable. Skeletal structures are intact. IMPRESSION: No active cardiopulmonary disease. Electronically Signed   By: Lajean Manes M.D.   On: 01/05/2017 16:32    Procedures Procedures (including critical care time)  Medications Ordered in ED Medications  albuterol (PROVENTIL) (2.5 MG/3ML) 0.083% nebulizer solution 5 mg (5 mg Nebulization Given 01/05/17 1855)  methylPREDNISolone sodium succinate (SOLU-MEDROL) 125 mg/2 mL injection 125 mg (125 mg Intravenous Given 01/05/17 1914)     Initial Impression / Assessment and Plan / ED Course  I have reviewed the triage vital signs and the nursing notes.  Pertinent labs & imaging results that were available during my care of the patient were reviewed by me and considered in my medical decision making (see chart for details).  Clinical Course     The patient has no findings on x-ray concerning for pneumonia, she has a known history of reactive airway disease and in fact does have wheezing on exam. Her labs are unremarkable, she will be given albuterol treatment, Solu-Medrol and will be discharged to follow-up in the outpatient setting. She does have a follow-up with the [facility] pulmonology department on Wednesday thickly distributed on day 5 of her prednisone and she should be much better. I've had the discussion with the patient and her  husband about what this does do her diabetic condition and hyperglycemia, they're well aware of this and are willing to watch this and adjust insulin accordingly. Given the change in weather and the patient's pulmonary exam I doubt this is related to a pulmonary embolus.  Improved after medications, imaging negative for infection, stable for discharge on prednisone and albuterol. Patient expresses understanding, requests discharge.  Vitals:   01/05/17 1830 01/05/17 1900 01/05/17 1911 01/05/17 1930  BP: 142/77 162/66  146/75  Pulse: 89 86  81  Resp: 17 21  13   Temp:      TempSrc:      SpO2: 97% 100% 98% 98%  Weight:      Height:         Final Clinical Impressions(s) / ED Diagnoses   Final diagnoses:  Moderate asthma with exacerbation, unspecified whether persistent    New Prescriptions New Prescriptions   PREDNISONE (DELTASONE) 20 MG TABLET    Take 2 tablets (40 mg total) by mouth daily.     Noemi Chapel, MD 01/05/17 2101

## 2017-01-20 NOTE — Progress Notes (Signed)
Discussed CBD dilation with Dr. Gala Romney. He wrote: "With no biliary symptoms, would not workup further. Chronic narcotic therapy is well known to cause some degree of biliary dilation. With normal LFTs and cholecystectomy state would not do anything else."

## 2017-01-21 ENCOUNTER — Other Ambulatory Visit: Payer: Self-pay | Admitting: Internal Medicine

## 2017-01-21 DIAGNOSIS — R058 Other specified cough: Secondary | ICD-10-CM

## 2017-01-21 DIAGNOSIS — R05 Cough: Secondary | ICD-10-CM

## 2017-01-22 ENCOUNTER — Encounter: Payer: Self-pay | Admitting: Gastroenterology

## 2017-01-22 ENCOUNTER — Ambulatory Visit (INDEPENDENT_AMBULATORY_CARE_PROVIDER_SITE_OTHER): Payer: BLUE CROSS/BLUE SHIELD | Admitting: Gastroenterology

## 2017-01-22 ENCOUNTER — Other Ambulatory Visit: Payer: Self-pay | Admitting: Gastroenterology

## 2017-01-22 VITALS — BP 138/72 | HR 96 | Temp 98.2°F | Ht 59.0 in | Wt 235.6 lb

## 2017-01-22 DIAGNOSIS — R197 Diarrhea, unspecified: Secondary | ICD-10-CM | POA: Diagnosis not present

## 2017-01-22 DIAGNOSIS — D649 Anemia, unspecified: Secondary | ICD-10-CM | POA: Diagnosis not present

## 2017-01-22 DIAGNOSIS — R1032 Left lower quadrant pain: Secondary | ICD-10-CM | POA: Insufficient documentation

## 2017-01-22 DIAGNOSIS — R1012 Left upper quadrant pain: Secondary | ICD-10-CM | POA: Diagnosis not present

## 2017-01-22 NOTE — Patient Instructions (Signed)
1. Please have your labs and CT scan done. 2. Please take Bentyl one capsule before breakfast, lunch, supper, and at bedtime. 3. Please discuss lorazepam/anxiety with Dr. Willey Blade.  Diet for Irritable Bowel Syndrome Introduction When you have irritable bowel syndrome (IBS), the foods you eat and your eating habits are very important. IBS may cause various symptoms, such as abdominal pain, constipation, or diarrhea. Choosing the right foods can help ease discomfort caused by these symptoms. Work with your health care provider and dietitian to find the best eating plan to help control your symptoms. What general guidelines do I need to follow?  Keep a food diary. This will help you identify foods that cause symptoms. Write down:  What you eat and when.  What symptoms you have.  When symptoms occur in relation to your meals.  Avoid foods that cause symptoms. Talk with your dietitian about other ways to get the same nutrients that are in these foods.  Eat more foods that contain fiber. Take a fiber supplement if directed by your dietitian.  Eat your meals slowly, in a relaxed setting.  Aim to eat 5-6 small meals per day. Do not skip meals.  Drink enough fluids to keep your urine clear or pale yellow.  Ask your health care provider if you should take an over-the-counter probiotic during flare-ups to help restore healthy gut bacteria.  If you have cramping or diarrhea, try making your meals low in fat and high in carbohydrates. Examples of carbohydrates are pasta, rice, whole grain breads and cereals, fruits, and vegetables.  If dairy products cause your symptoms to flare up, try eating less of them. You might be able to handle yogurt better than other dairy products because it contains bacteria that help with digestion. What foods are not recommended? The following are some foods and drinks that may worsen your symptoms:  Fatty foods, such as [place] fries.  Milk products, such as cheese  or ice cream.  Chocolate.  Alcohol.  Products with caffeine, such as coffee.  Carbonated drinks, such as soda. The items listed above may not be a complete list of foods and beverages to avoid. Contact your dietitian for more information.  What foods are good sources of fiber? Your health care provider or dietitian may recommend that you eat more foods that contain fiber. Fiber can help reduce constipation and other IBS symptoms. Add foods with fiber to your diet a little at a time so that your body can get used to them. Too much fiber at once might cause gas and swelling of your abdomen. The following are some foods that are good sources of fiber:  Apples.  Peaches.  Pears.  Berries.  Figs.  Broccoli (raw).  Cabbage.  Carrots.  Raw peas.  Kidney beans.  Lima beans.  Whole grain bread.  Whole grain cereal. Where to find more information: [employer] for Functional Gastrointestinal Disorders: www.iffgd.[employer] of Diabetes and Digestive and Kidney Diseases: NetworkAffair.co.za.aspx This information is not intended to replace advice given to you by your health care provider. Make sure you discuss any questions you have with your health care provider. Document Released: 03/01/2004 Document Revised: 05/17/2016 Document Reviewed: 03/12/2014  2017 Elsevier

## 2017-01-22 NOTE — Progress Notes (Signed)
No PA is required for CT scan

## 2017-01-22 NOTE — Progress Notes (Signed)
Primary Care Physician: Asencion Noble, MD  Primary Gastroenterologist:  Garfield Cornea, MD   Chief Complaint  Patient presents with  . Abdominal Pain    LUQ  . Diarrhea    sometimes worse after eating, occurred several times this month  . Nausea    HPI: Ann Fuller is a 71 y.o. female hereFor further evaluation of abdominal pain, diarrhea. Last seen in September 2017. History of GERD, diverticulitis, IBS. History of CBD dilation requiring no further workup.EGD December 2011 hiatal hernia, fundic gland type polyps. Colonoscopy 10/2015, with several tubular adenomas, next TCS planned for 10/2020.   Patient states she has been having issues with left sided abdominal pain and diarrhea over the past four months, becoming more frequent. Correlates with anxiety and also with meals. Urgency with Incontinence. 7 bad episodes of the past 6 weeks. Nocturnal stools as well. Sleeps on a towel or with diapers. She does have solid stools in between episodes. She takes Bentyl every morning and every evening every single day. When she's had these episodes she has taken for daily. Bentyl helps with abdominal pain but does not slow down the stools. She generally have 5-6 stools were until she is "empty". No melena or rectal bleeding. She believes she needs to go up on her Ativan. Plans to discuss with PCP. She does not take daily. No vomiting or weight loss. She's had some nausea. Consuming low dairy, no salads. She inquires about lap band procedure. Advised her that she needs to get her GI symptoms under control before she considers.    Current Outpatient Prescriptions  Medication Sig Dispense Refill  . amLODipine (NORVASC) 5 MG tablet Take 5 mg by mouth daily.    Marland Kitchen atorvastatin (LIPITOR) 40 MG tablet Take 40 mg by mouth at bedtime.     . baclofen (LIORESAL) 10 MG tablet Take 5 mg by mouth as needed.     . benzonatate (TESSALON) 200 MG capsule TAKE ONE CAPSULE BY MOUTH EVERY 6 HOURS FOR COUGH  (Patient taking differently: TAKE ONE CAPSULE BY MOUTH EVERY 6 HOURS FOR COUGH. Takes as needed) 60 capsule 0  . budesonide-formoterol (SYMBICORT) 80-4.5 MCG/ACT inhaler Inhale 2 puffs into the lungs 2 (two) times daily. 1 Inhaler 0  . chlorpheniramine (CHLOR-TRIMETON) 4 MG tablet Take 4 mg by mouth every 4 (four) hours as needed (drippy nose, drainage, throat clearing).     . chlorthalidone (HYGROTON) 25 MG tablet Take 25 mg by mouth every morning.     . dicyclomine (BENTYL) 10 MG capsule TAKE ONE CAPSULE BY MOUTH PRIOR TO MEALSAS NEEDED. NO MORE THAN FOUR TIMES DAILY. MAY CAUSE DROWINESS. (Patient taking differently: Take 1 tablet by mouth 4 times a day as needed for abdomninal cramps) 120 capsule 11  . doxazosin (CARDURA) 2 MG tablet Take 2 mg by mouth every morning.     Marland Kitchen exenatide (BYETTA 10 MCG PEN) 10 MCG/0.04ML SOLN Inject 10 mcg into the skin 2 (two) times daily with a meal.     . gabapentin (NEURONTIN) 100 MG capsule Take 1 capsule (100 mg total) by mouth 4 (four) times daily. (Patient taking differently: Take 100 mg by mouth 2 (two) times daily. ) 120 capsule 2  . Insulin Glargine (LANTUS) 100 UNIT/ML Solostar Pen Inject 30 Units into the skin every morning.     . Insulin Lispro (HUMALOG KWIKPEN [place]) Inject 20 Units into the skin 2 (two) times daily.    Marland Kitchen ipratropium-albuterol (DUONEB) 0.5-2.5 (3) MG/3ML SOLN  Take 3 mLs by nebulization every 4 (four) hours. (Patient taking differently: Take 3 mLs by nebulization every 6 (six) hours as needed (wheezing and shortness of breath). ) 360 mL 6  . LORazepam (ATIVAN) 1 MG tablet Take 1 mg by mouth 2 (two) times daily as needed. Anxiety/Sleep    . montelukast (SINGULAIR) 10 MG tablet TAKE ONE TABLET BY MOUTH EVERY NIGHT AT BEDTIME 30 tablet 5  . pantoprazole (PROTONIX) 40 MG tablet Take 1 tablet (40 mg total) by mouth 2 (two) times daily before a meal. 60 tablet 5  . POTASSIUM PO Take 1 tablet by mouth daily.    Marland Kitchen Spacer/Aero-Holding Chambers  (AEROCHAMBER MV) inhaler Use as instructed 1 each 0  . temazepam (RESTORIL) 15 MG capsule Take 15 mg by mouth at bedtime as needed for sleep.    . valsartan (DIOVAN) 320 MG tablet Take 320 mg by mouth daily.    . famotidine (PEPCID) 20 MG tablet One at bedtime (Patient not taking: Reported on 01/22/2017)    . magnesium oxide (MAG-OX) 400 MG tablet Take 400 mg by mouth daily.    . predniSONE (DELTASONE) 20 MG tablet Take 2 tablets (40 mg total) by mouth daily. (Patient not taking: Reported on 01/22/2017) 10 tablet 0   No current facility-administered medications for this visit.     Allergies as of 01/22/2017 - Review Complete 01/22/2017  Allergen Reaction Noted  . Ceftin [cefuroxime axetil] Swelling 05/02/2012  . Codeine Swelling   . Metformin and related Diarrhea 11/26/2012  . Nexium [esomeprazole magnesium] Diarrhea 05/02/2012  . Omnicef [cefdinir] Diarrhea 05/02/2012  . Sulfa antibiotics Rash 05/02/2012    ROS:  General: Negative for anorexia, weight loss, fever, chills, fatigue, weakness. ENT: Negative for hoarseness, difficulty swallowing , nasal congestion. CV: Negative for chest pain, angina, palpitations, dyspnea on exertion, peripheral edema.  Respiratory: Negative for dyspnea at rest, dyspnea on exertion, cough, sputum, wheezing.  GI: See history of present illness. GU:  Negative for dysuria, hematuria, urinary incontinence, urinary frequency, nocturnal urination.  Endo: Negative for unusual weight change.    Physical Examination:   BP 138/72   Pulse 96   Temp 98.2 F (36.8 C) (Oral)   Ht 4\' 11"  (1.499 m)   Wt 235 lb 9.6 oz (106.9 kg)   BMI 47.59 kg/m   General: Well-nourished, well-developed in no acute distress.  Eyes: No icterus. Mouth: Oropharyngeal mucosa moist and pink , no lesions erythema or exudate. Lungs: Clear to auscultation bilaterally.  Heart: Regular rate and rhythm, no murmurs rubs or gallops.  Abdomen: Bowel sounds are normal, LLQ to left mid abd  tenderness to deep palpation, moderate in severity, nondistended, no hepatosplenomegaly or masses, no abdominal bruits or hernia , no rebound or guarding.   Extremities: No lower extremity edema. No clubbing or deformities. Neuro: Alert and oriented x 4   Skin: Warm and dry, no jaundice.   Psych: Alert and cooperative, normal mood and affect.  Labs:  Lab Results  Component Value Date   CREATININE 0.77 01/05/2017   BUN 15 01/05/2017   NA 135 01/05/2017   K 3.3 (L) 01/05/2017   CL 99 (L) 01/05/2017   CO2 29 01/05/2017   Lab Results  Component Value Date   ALT 14 01/05/2017   AST 19 01/05/2017   ALKPHOS 50 01/05/2017   BILITOT 0.4 01/05/2017   Lab Results  Component Value Date   WBC 8.1 01/05/2017   HGB 10.5 (L) 01/05/2017   HCT 31.5 (L)  01/05/2017   MCV 89.0 01/05/2017   PLT 260 01/05/2017    Imaging Studies: Dg Chest 2 View  Result Date: 01/05/2017 CLINICAL DATA:  Productive cough.  Generalized malaise. EXAM: CHEST  2 VIEW COMPARISON:  08/29/2016 FINDINGS: Cardiac silhouette is normal in size. No mediastinal or hilar masses. No convincing adenopathy. Lungs are clear.  No pleural effusion or pneumothorax. Eventration of the anterior right diaphragm is stable. Status post left breast surgery, stable. Skeletal structures are intact. IMPRESSION: No active cardiopulmonary disease. Electronically Signed   By: Lajean Manes M.D.   On: 01/05/2017 16:32

## 2017-01-22 NOTE — Progress Notes (Signed)
cc'ed to pcp °

## 2017-01-22 NOTE — Assessment & Plan Note (Signed)
71 year old female with 3-4 month history of progressive left mid to left lower quadrant abdominal pain associated with diarrhea intermittent nature. 7 episodes in the past 6 weeks. Symptoms last for less than one day at a time. In between she has normal bowel movements. No significant improvement with Bentyl as far as stool frequency. She has had nocturnal episodes. Colonoscopy 2016 as outlined above. I suspect we are dealing with IBS D. She does have diverticulosis and given left mid to left lower quadrant abdominal tenderness on exam, have offered her CT abdomen pelvis with IV and oral contrast. Also obtain routine labs further evaluate her anemia which is been progressive as well. Advised increase Bentyl to daily see daily at bedtime 4 times daily. Further recommendations to follow.

## 2017-01-23 ENCOUNTER — Telehealth: Payer: Self-pay

## 2017-01-23 LAB — CREATININE, SERUM
Creatinine, Ser: 0.89 mg/dL (ref 0.57–1.00)
GFR calc Af Amer: 77 mL/min/{1.73_m2} (ref >59)
GFR calc non Af Amer: 67 mL/min/{1.73_m2} (ref >59)

## 2017-01-23 LAB — CBC/DIFF AMBIGUOUS DEFAULT
BASOS ABS: 0 10*3/uL (ref 0.0–0.2)
Basos: 0 %
EOS (ABSOLUTE): 0.3 10*3/uL (ref 0.0–0.4)
Eos: 4 %
Hematocrit: 33 % — ABNORMAL LOW (ref 34.0–46.6)
Hemoglobin: 10.7 g/dL — ABNORMAL LOW (ref 11.1–15.9)
IMMATURE GRANS (ABS): 0 10*3/uL (ref 0.0–0.1)
Immature Granulocytes: 0 %
LYMPHS: 32 %
Lymphocytes Absolute: 2.7 10*3/uL (ref 0.7–3.1)
MCH: 28.4 pg (ref 26.6–33.0)
MCHC: 32.4 g/dL (ref 31.5–35.7)
MCV: 88 fL (ref 79–97)
Monocytes Absolute: 0.9 10*3/uL (ref 0.1–0.9)
Monocytes: 10 %
NEUTROS ABS: 4.5 10*3/uL (ref 1.4–7.0)
NEUTROS PCT: 54 %
PLATELETS: 280 10*3/uL (ref 150–379)
RBC: 3.77 x10E6/uL (ref 3.77–5.28)
RDW: 14.4 % (ref 12.3–15.4)
WBC: 8.5 10*3/uL (ref 3.4–10.8)

## 2017-01-23 LAB — IRON AND TIBC
Iron Saturation: 20 % (ref 15–55)
Iron: 62 ug/dL (ref 27–139)
TIBC: 317 ug/dL (ref 250–450)
UIBC: 255 ug/dL (ref 118–369)

## 2017-01-23 LAB — LIPASE: LIPASE: 43 U/L (ref 14–72)

## 2017-01-23 LAB — FERRITIN: FERRITIN: 108 ng/mL (ref 15–150)

## 2017-01-23 NOTE — Telephone Encounter (Signed)
Received labs from [facility] and they are in [facility] cart.

## 2017-01-24 ENCOUNTER — Ambulatory Visit (HOSPITAL_COMMUNITY)
Admission: RE | Admit: 2017-01-24 | Discharge: 2017-01-24 | Disposition: A | Discharge status: Home / Self Care | Payer: BLUE CROSS/BLUE SHIELD | Source: Ambulatory Visit | Attending: Gastroenterology | Admitting: Gastroenterology

## 2017-01-24 DIAGNOSIS — R1032 Left lower quadrant pain: Secondary | ICD-10-CM | POA: Diagnosis present

## 2017-01-24 DIAGNOSIS — I708 Atherosclerosis of other arteries: Secondary | ICD-10-CM | POA: Diagnosis not present

## 2017-01-24 DIAGNOSIS — R1012 Left upper quadrant pain: Secondary | ICD-10-CM | POA: Insufficient documentation

## 2017-01-24 DIAGNOSIS — R197 Diarrhea, unspecified: Secondary | ICD-10-CM | POA: Insufficient documentation

## 2017-01-24 DIAGNOSIS — I700 Atherosclerosis of aorta: Secondary | ICD-10-CM | POA: Insufficient documentation

## 2017-01-24 DIAGNOSIS — I2510 Atherosclerotic heart disease of native coronary artery without angina pectoris: Secondary | ICD-10-CM | POA: Insufficient documentation

## 2017-01-24 DIAGNOSIS — K5730 Diverticulosis of large intestine without perforation or abscess without bleeding: Secondary | ICD-10-CM | POA: Diagnosis not present

## 2017-01-24 DIAGNOSIS — D649 Anemia, unspecified: Secondary | ICD-10-CM | POA: Diagnosis not present

## 2017-01-24 MED ORDER — IOPAMIDOL (ISOVUE-300) INJECTION 61%
100.0000 mL | Freq: Once | INTRAVENOUS | Status: AC | PRN
  Administered 2017-01-24: 100 mL via INTRAVENOUS

## 2017-01-24 NOTE — Telephone Encounter (Signed)
Noted  

## 2017-01-28 NOTE — Progress Notes (Signed)
Hgb stable. No evidence of iron deficiency. Await ct.

## 2017-01-28 NOTE — Progress Notes (Signed)
Please let patient know her CT shows no diverticulitis and nothing to explain abd pain and diarrhea. Suspect IBS-D.  If she continues to have diarrhea on bentyl, she can add imodium 1/2 to 1 tablet every morning. Hold for constipation.   Return to the office in 6 weeks with RMR.  F/U with PCP regarding CAD/aortoiliac atherosclerosis seen on CT. Send copy of CT. She may need cardiology evaluation but will leave up to PCP.

## 2017-01-29 ENCOUNTER — Encounter: Payer: Self-pay | Admitting: Internal Medicine

## 2017-01-29 NOTE — Progress Notes (Signed)
APPT MADE AND LETTER SENT  °

## 2017-02-12 ENCOUNTER — Ambulatory Visit (INDEPENDENT_AMBULATORY_CARE_PROVIDER_SITE_OTHER): Payer: Medicare Other | Admitting: Internal Medicine

## 2017-02-12 ENCOUNTER — Encounter: Payer: Self-pay | Admitting: Internal Medicine

## 2017-02-12 VITALS — BP 137/72 | HR 106 | Temp 97.9°F | Ht 59.0 in | Wt 235.0 lb

## 2017-02-12 DIAGNOSIS — K219 Gastro-esophageal reflux disease without esophagitis: Secondary | ICD-10-CM | POA: Diagnosis not present

## 2017-02-12 DIAGNOSIS — R1032 Left lower quadrant pain: Secondary | ICD-10-CM

## 2017-02-12 DIAGNOSIS — R197 Diarrhea, unspecified: Secondary | ICD-10-CM | POA: Diagnosis not present

## 2017-02-12 NOTE — Patient Instructions (Addendum)
Continue Protonix 40mg  twice daily  Stop oral magnesium  Continue probiotic  Use imodium 1/2 tablet as needed  Stool sample for GIP panel and C diff  Further recommendations to follow

## 2017-02-12 NOTE — Progress Notes (Signed)
Primary Care Physician:  Asencion Noble, MD Primary Gastroenterologist:  Dr. Gala Romney  Pre-Procedure History & Physical: HPI:  Ann Fuller is a 71 y.o. female here for left lower quadrant abdominal pain, intermittent nonbloody watery diarrhea. Patient states lately she has had diarrhea 90% of the time. She has occasional good days;  for instance, yesterday she had a good day having 1 formed bowel movement. Has not seen any blood. One half of a single Imodium really slows her diarrhea down for a couple of days. Recent CT demonstrated colonic diverticulosis but no diverticulitis or other explanation for her symptoms. Still has a cough. Remains on valsartan. She is being referred to [facility] to look into her cough further.  She's not taking metformin. She has been diabetic for some time. She did start a probiotic last month because someone told her it would protect her from the "flu". Went to the ED and was given antibiotics last month as well. She takes oral magnesium for leg cramps when necessary  Past Medical History:  Diagnosis Date  . Adenomatous colon polyp   . Arthritis   . Asthma   . Breast cancer ([place]) [id number]  . Bulging lumbar disc   . Depression   . Diverticulitis   . Dyspepsia   . Essential hypertension, benign   . GERD (gastroesophageal reflux disease)   . Heart murmur   . IBS (irritable bowel syndrome)   . Internal hemorrhoid   . PONV (postoperative nausea and vomiting)   . Right shoulder pain   . Shortness of breath dyspnea    when walking a lot  . Tubular adenoma 11/2010  . Type 2 diabetes mellitus ([place])     Past Surgical History:  Procedure Laterality Date  . ABDOMINAL HYSTERECTOMY    . APPENDECTOMY    . BACK SURGERY  2007   Lumbar fusion  . CATARACT EXTRACTION W/PHACO Left 08/27/2013   Procedure: CATARACT EXTRACTION PHACO AND INTRAOCULAR LENS PLACEMENT (IOC);  Surgeon: Tonny Branch, MD;  Location: [facility];  Service: Ophthalmology;  Laterality: Left;  CDE 4.25    . CATARACT EXTRACTION W/PHACO Right 09/24/2013   Procedure: CATARACT EXTRACTION PHACO AND INTRAOCULAR LENS PLACEMENT (IOC);  Surgeon: Tonny Branch, MD;  Location: [facility];  Service: Ophthalmology;  Laterality: Right;  CDE:  8.16  . CHOLECYSTECTOMY    . COLONOSCOPY  12/13/2010   Dr. Margarito Courser pancolonic diverticular.tubular adenoma  . COLONOSCOPY  07/13/2003  . COLONOSCOPY N/A 10/28/2015   [id number] diverticulosis, multiple tubular adenomas removed. next tcs 10/2020.   . ESOPHAGOGASTRODUODENOSCOPY  12/13/2010   Dr. Jennet Maduro hernia, fundal gland type polyps  . EYE SURGERY Left    Left KPE 08/27/13  . MASTECTOMY  1995  . NASAL ENDOSCOPY WITH EPISTAXIS CONTROL Left 07/16/2016   Procedure: ENDOSCOPIC LEFT NASAL CAUTERY;  Surgeon: Leta Baptist, MD;  Location: [place];  Service: ENT;  Laterality: Left;  . TONSILLECTOMY    . TUBAL LIGATION      Prior to Admission medications   Medication Sig Start Date End Date Taking? Authorizing Provider  amLODipine (NORVASC) 5 MG tablet Take 5 mg by mouth daily.   Yes Historical Provider, MD  aspirin EC 81 MG tablet Take 81 mg by mouth daily.   Yes Historical Provider, MD  atorvastatin (LIPITOR) 40 MG tablet Take 40 mg by mouth at bedtime.    Yes Historical Provider, MD  baclofen (LIORESAL) 10 MG tablet Take 5 mg by mouth as needed.    Yes Historical  Provider, MD  benzonatate (TESSALON) 200 MG capsule TAKE ONE CAPSULE BY MOUTH EVERY 6 HOURS FOR COUGH Patient taking differently: TAKE ONE CAPSULE BY MOUTH EVERY 6 HOURS FOR COUGH. Takes as needed 08/28/16  Yes Tanda Rockers, MD  budesonide-formoterol [facility]) 80-4.5 MCG/ACT inhaler Inhale 2 puffs into the lungs 2 (two) times daily. 08/31/16  Yes Tanda Rockers, MD  chlorpheniramine (CHLOR-TRIMETON) 4 MG tablet Take 4 mg by mouth every 4 (four) hours as needed (drippy nose, drainage, throat clearing).    Yes Historical Provider, MD  chlorthalidone (HYGROTON) 25 MG tablet Take 25 mg by mouth  every morning.    Yes Historical Provider, MD  dicyclomine (BENTYL) 10 MG capsule TAKE ONE CAPSULE BY MOUTH PRIOR TO MEALSAS NEEDED. NO MORE THAN FOUR TIMES DAILY. MAY CAUSE DROWINESS. Patient taking differently: Take 1 tablet by mouth 4 times a day as needed for abdomninal cramps 11/14/15  Yes Mahala Menghini, PA-C  doxazosin (CARDURA) 2 MG tablet Take 4 mg by mouth every morning.    Yes Historical Provider, MD  exenatide (BYETTA 10 MCG PEN) 10 MCG/0.04ML SOLN Inject 10 mcg into the skin 2 (two) times daily with a meal.    Yes Historical Provider, MD  gabapentin (NEURONTIN) 100 MG capsule Take 1 capsule (100 mg total) by mouth 4 (four) times daily. Patient taking differently: Take 100 mg by mouth 2 (two) times daily.  08/31/16  Yes Tanda Rockers, MD  Insulin Glargine (LANTUS) 100 UNIT/ML Solostar Pen Inject 30 Units into the skin every morning.    Yes Historical Provider, MD  Insulin Lispro (HUMALOG KWIKPEN [place]) Inject 20 Units into the skin 2 (two) times daily.   Yes Historical Provider, MD  ipratropium-albuterol (DUONEB) 0.5-2.5 (3) MG/3ML SOLN Take 3 mLs by nebulization every 4 (four) hours. Patient taking differently: Take 3 mLs by nebulization every 6 (six) hours as needed (wheezing and shortness of breath).  04/10/15  Yes Asencion Noble, MD  LORazepam (ATIVAN) 1 MG tablet Take 1 mg by mouth 2 (two) times daily as needed. Anxiety/Sleep   Yes Historical Provider, MD  magnesium oxide (MAG-OX) 400 MG tablet Take 400 mg by mouth daily.   Yes Historical Provider, MD  montelukast (SINGULAIR) 10 MG tablet TAKE ONE TABLET BY MOUTH EVERY NIGHT AT BEDTIME 11/20/16  Yes Tanda Rockers, MD  pantoprazole (PROTONIX) 40 MG tablet Take 1 tablet (40 mg total) by mouth 2 (two) times daily before a meal. 09/10/16  Yes Mahala Menghini, PA-C  Spacer/Aero-Holding Chambers (AEROCHAMBER MV) inhaler Use as instructed 08/31/16  Yes Tanda Rockers, MD  temazepam (RESTORIL) 15 MG capsule Take 15 mg by mouth at bedtime as needed for  sleep.   Yes Historical Provider, MD  valsartan (DIOVAN) 320 MG tablet Take 320 mg by mouth daily.   Yes Historical Provider, MD  vitamin C (ASCORBIC ACID) 500 MG tablet Take 500 mg by mouth as needed.   Yes Historical Provider, MD  famotidine (PEPCID) 20 MG tablet One at bedtime Patient not taking: Reported on 01/22/2017 10/10/15   Tanda Rockers, MD  POTASSIUM PO Take 1 tablet by mouth daily.    Historical Provider, MD  predniSONE (DELTASONE) 20 MG tablet Take 2 tablets (40 mg total) by mouth daily. Patient not taking: Reported on 01/22/2017 01/05/17   Noemi Chapel, MD    Allergies as of 02/12/2017 - Review Complete 02/12/2017  Allergen Reaction Noted  . Ceftin [cefuroxime axetil] Swelling 05/02/2012  . Codeine Swelling   .  Metformin and related Diarrhea 11/26/2012  . Nexium [esomeprazole magnesium] Diarrhea 05/02/2012  . Omnicef [cefdinir] Diarrhea 05/02/2012  . Sulfa antibiotics Rash 05/02/2012    Family History  Problem Relation Age of Onset  . Colon cancer Mother   . Heart attack Father   . Heart attack Sister   . Stroke Sister   . Cancer - Lung Sister     Social History   Social History  . Marital status: Married    Spouse name: N/A  . Number of children: N/A  . Years of education: N/A   Occupational History  . Not on file.   Social History Main Topics  . Smoking status: Never Smoker  . Smokeless tobacco: Never Used  . Alcohol use No  . Drug use: No  . Sexual activity: Yes    Birth control/ protection: Surgical   Other Topics Concern  . Not on file   Social History Narrative  . No narrative on file    Review of Systems: See HPI, otherwise negative ROS  Physical Exam: BP 137/72   Pulse (!) 106   Temp 97.9 F (36.6 C) (Oral)   Ht 4\' 11"  (1.499 m)   Wt 235 lb (106.6 kg)   BMI 47.46 kg/m  General:   Alert,  Well-developed, well-nourished, pleasant and cooperative in NAD;  Accompanied by her husband Mouth:  No deformity or lesions. Neck:  Supple; no  masses or thyromegaly. No significant cervical adenopathy. Lungs:  Clear throughout to auscultation.   No wheezes, crackles, or rhonchi. No acute distress. Heart:  Regular rate and rhythm; no murmurs, clicks, rubs,  or gallops. Abdomen: Obese. Non-distended, normal bowel sounds.  Soft and nontender without appreciable mass or hepatosplenomegaly.  Pulses:  Normal pulses noted. Extremities:  Without clubbing or edema.  Impression:   Pleasant morbidly obese 71 year old lady with diabetes,  intermittent postprandial abdominal pain and diarrhea most consistent with irritable bowel syndrome. Some worsening in abdominal pain in association with nonbloody diarrhea recently. By history, recent exposure to antibiotics. Recent addition of probiotic therapy. Takes magnesium regularly for leg cramps. Recent CT scan reassuring. Use of magnesium may be exacerbating diarrhea. An element of diabetic visceral enteropathy not excluded this time.  Recommendations:   Continue Protonix 40mg  twice daily  Stop oral magnesium  Continue probiotic  Use imodium 1/2 tablet as needed  Stool sample for GIP panel and C diff  Further recommendations to follow     Notice: This dictation was prepared with Dragon dictation along with smaller phrase technology. Any transcriptional errors that result from this process are unintentional and may not be corrected upon review.

## 2017-02-13 ENCOUNTER — Other Ambulatory Visit: Payer: Self-pay | Admitting: Internal Medicine

## 2017-02-15 LAB — CLOSTRIDIUM DIFFICILE BY PCR: Toxigenic C. Difficile by PCR: NEGATIVE

## 2017-02-22 LAB — GI PROFILE, STOOL, PCR

## 2017-02-22 LAB — PLEASE NOTE

## 2017-02-22 LAB — TEST CODE CHANGE

## 2017-02-23 ENCOUNTER — Other Ambulatory Visit: Payer: Self-pay | Admitting: Gastroenterology

## 2017-04-10 DIAGNOSIS — J4550 Severe persistent asthma, uncomplicated: Secondary | ICD-10-CM | POA: Diagnosis present

## 2017-04-26 ENCOUNTER — Other Ambulatory Visit: Payer: Self-pay | Admitting: Gastroenterology

## 2017-04-26 ENCOUNTER — Other Ambulatory Visit: Payer: Self-pay | Admitting: Nurse Practitioner

## 2017-08-13 ENCOUNTER — Other Ambulatory Visit: Payer: Self-pay | Admitting: Internal Medicine

## 2017-08-13 DIAGNOSIS — R058 Other specified cough: Secondary | ICD-10-CM

## 2017-08-13 DIAGNOSIS — R05 Cough: Secondary | ICD-10-CM

## 2017-08-22 ENCOUNTER — Other Ambulatory Visit: Payer: Self-pay | Admitting: Internal Medicine

## 2017-08-22 DIAGNOSIS — R05 Cough: Secondary | ICD-10-CM

## 2017-08-22 DIAGNOSIS — R058 Other specified cough: Secondary | ICD-10-CM

## 2017-08-27 ENCOUNTER — Other Ambulatory Visit: Payer: Self-pay | Admitting: Internal Medicine

## 2017-08-27 NOTE — Telephone Encounter (Signed)
Received Rx request of Montelukast. Pt last seen at our office 08/31/16 by Dr. Melvyn Novas. At last OV, pt was told to schedule an appt to have all meds brought in so we could be able to see what all she is taking. Left message for pt letting her know that she was going to need to come in for an OV if she wanted [place] to refill this med for her, or she could contact her PCP to have them refill the med for her if she did not want to come in for an OV with [place].  Nothing further needed at this time.

## 2017-10-23 DIAGNOSIS — I350 Nonrheumatic aortic (valve) stenosis: Secondary | ICD-10-CM

## 2017-11-05 ENCOUNTER — Other Ambulatory Visit: Payer: Self-pay | Admitting: Obstetrics and Gynecology

## 2017-11-05 DIAGNOSIS — Z1231 Encounter for screening mammogram for malignant neoplasm of breast: Secondary | ICD-10-CM

## 2017-11-17 ENCOUNTER — Emergency Department (HOSPITAL_COMMUNITY): Payer: Medicare Other

## 2017-11-17 ENCOUNTER — Other Ambulatory Visit: Payer: Self-pay

## 2017-11-17 ENCOUNTER — Inpatient Hospital Stay (HOSPITAL_COMMUNITY)
Admission: EM | Admit: 2017-11-17 | Discharge: 2017-11-21 | DRG: 291 | Disposition: A | Discharge status: Home Health | Payer: Medicare Other | Attending: Internal Medicine | Admitting: Internal Medicine

## 2017-11-17 ENCOUNTER — Encounter (HOSPITAL_COMMUNITY): Payer: Self-pay

## 2017-11-17 DIAGNOSIS — M5126 Other intervertebral disc displacement, lumbar region: Secondary | ICD-10-CM | POA: Diagnosis present

## 2017-11-17 DIAGNOSIS — E785 Hyperlipidemia, unspecified: Secondary | ICD-10-CM | POA: Diagnosis present

## 2017-11-17 DIAGNOSIS — Z800 Family history of malignant neoplasm of digestive organs: Secondary | ICD-10-CM

## 2017-11-17 DIAGNOSIS — Z794 Long term (current) use of insulin: Secondary | ICD-10-CM | POA: Diagnosis not present

## 2017-11-17 DIAGNOSIS — I5189 Other ill-defined heart diseases: Secondary | ICD-10-CM | POA: Diagnosis present

## 2017-11-17 DIAGNOSIS — Z881 Allergy status to other antibiotic agents status: Secondary | ICD-10-CM | POA: Diagnosis not present

## 2017-11-17 DIAGNOSIS — E872 Acidosis, unspecified: Secondary | ICD-10-CM | POA: Diagnosis present

## 2017-11-17 DIAGNOSIS — I110 Hypertensive heart disease with heart failure: Principal | ICD-10-CM | POA: Diagnosis present

## 2017-11-17 DIAGNOSIS — Z8249 Family history of ischemic heart disease and other diseases of the circulatory system: Secondary | ICD-10-CM | POA: Diagnosis not present

## 2017-11-17 DIAGNOSIS — Z9049 Acquired absence of other specified parts of digestive tract: Secondary | ICD-10-CM | POA: Diagnosis not present

## 2017-11-17 DIAGNOSIS — J189 Pneumonia, unspecified organism: Secondary | ICD-10-CM | POA: Diagnosis present

## 2017-11-17 DIAGNOSIS — Z885 Allergy status to narcotic agent status: Secondary | ICD-10-CM

## 2017-11-17 DIAGNOSIS — Z6841 Body Mass Index (BMI) 40.0 and over, adult: Secondary | ICD-10-CM | POA: Diagnosis not present

## 2017-11-17 DIAGNOSIS — Z823 Family history of stroke: Secondary | ICD-10-CM

## 2017-11-17 DIAGNOSIS — I10 Essential (primary) hypertension: Secondary | ICD-10-CM | POA: Diagnosis present

## 2017-11-17 DIAGNOSIS — K219 Gastro-esophageal reflux disease without esophagitis: Secondary | ICD-10-CM | POA: Diagnosis present

## 2017-11-17 DIAGNOSIS — J82 Pulmonary eosinophilia, not elsewhere classified: Secondary | ICD-10-CM | POA: Diagnosis present

## 2017-11-17 DIAGNOSIS — Z853 Personal history of malignant neoplasm of breast: Secondary | ICD-10-CM

## 2017-11-17 DIAGNOSIS — M1990 Unspecified osteoarthritis, unspecified site: Secondary | ICD-10-CM | POA: Diagnosis present

## 2017-11-17 DIAGNOSIS — I519 Heart disease, unspecified: Secondary | ICD-10-CM | POA: Diagnosis not present

## 2017-11-17 DIAGNOSIS — Z961 Presence of intraocular lens: Secondary | ICD-10-CM | POA: Diagnosis present

## 2017-11-17 DIAGNOSIS — Z9841 Cataract extraction status, right eye: Secondary | ICD-10-CM | POA: Diagnosis not present

## 2017-11-17 DIAGNOSIS — I5033 Acute on chronic diastolic (congestive) heart failure: Secondary | ICD-10-CM | POA: Diagnosis present

## 2017-11-17 DIAGNOSIS — Z9842 Cataract extraction status, left eye: Secondary | ICD-10-CM

## 2017-11-17 DIAGNOSIS — Z9012 Acquired absence of left breast and nipple: Secondary | ICD-10-CM | POA: Diagnosis not present

## 2017-11-17 DIAGNOSIS — J45901 Unspecified asthma with (acute) exacerbation: Secondary | ICD-10-CM

## 2017-11-17 DIAGNOSIS — Z7982 Long term (current) use of aspirin: Secondary | ICD-10-CM

## 2017-11-17 DIAGNOSIS — D649 Anemia, unspecified: Secondary | ICD-10-CM | POA: Diagnosis present

## 2017-11-17 DIAGNOSIS — Z923 Personal history of irradiation: Secondary | ICD-10-CM

## 2017-11-17 DIAGNOSIS — R05 Cough: Secondary | ICD-10-CM | POA: Diagnosis present

## 2017-11-17 DIAGNOSIS — Z882 Allergy status to sulfonamides status: Secondary | ICD-10-CM

## 2017-11-17 DIAGNOSIS — J9620 Acute and chronic respiratory failure, unspecified whether with hypoxia or hypercapnia: Secondary | ICD-10-CM | POA: Diagnosis present

## 2017-11-17 DIAGNOSIS — F329 Major depressive disorder, single episode, unspecified: Secondary | ICD-10-CM | POA: Diagnosis present

## 2017-11-17 DIAGNOSIS — Z888 Allergy status to other drugs, medicaments and biological substances status: Secondary | ICD-10-CM

## 2017-11-17 DIAGNOSIS — Z90710 Acquired absence of both cervix and uterus: Secondary | ICD-10-CM | POA: Diagnosis not present

## 2017-11-17 DIAGNOSIS — E119 Type 2 diabetes mellitus without complications: Secondary | ICD-10-CM | POA: Diagnosis present

## 2017-11-17 DIAGNOSIS — R059 Cough, unspecified: Secondary | ICD-10-CM

## 2017-11-17 DIAGNOSIS — G4700 Insomnia, unspecified: Secondary | ICD-10-CM | POA: Diagnosis present

## 2017-11-17 DIAGNOSIS — I080 Rheumatic disorders of both mitral and aortic valves: Secondary | ICD-10-CM | POA: Diagnosis present

## 2017-11-17 DIAGNOSIS — I350 Nonrheumatic aortic (valve) stenosis: Secondary | ICD-10-CM | POA: Diagnosis not present

## 2017-11-17 DIAGNOSIS — M5136 Other intervertebral disc degeneration, lumbar region: Secondary | ICD-10-CM | POA: Diagnosis present

## 2017-11-17 DIAGNOSIS — Z9221 Personal history of antineoplastic chemotherapy: Secondary | ICD-10-CM

## 2017-11-17 DIAGNOSIS — K5790 Diverticulosis of intestine, part unspecified, without perforation or abscess without bleeding: Secondary | ICD-10-CM | POA: Diagnosis present

## 2017-11-17 HISTORY — DX: Hyperlipidemia, unspecified: E78.5

## 2017-11-17 LAB — CBC WITH DIFFERENTIAL/PLATELET
BASOS ABS: 0 10*3/uL (ref 0.0–0.1)
BASOS PCT: 0 %
EOS PCT: 1 %
Eosinophils Absolute: 0.1 10*3/uL (ref 0.0–0.7)
HCT: 29 % — ABNORMAL LOW (ref 36.0–46.0)
Hemoglobin: 9.1 g/dL — ABNORMAL LOW (ref 12.0–15.0)
Lymphocytes Relative: 9 %
Lymphs Abs: 1.1 10*3/uL (ref 0.7–4.0)
MCH: 29.3 pg (ref 26.0–34.0)
MCHC: 31.4 g/dL (ref 30.0–36.0)
MCV: 93.2 fL (ref 78.0–100.0)
MONO ABS: 1.2 10*3/uL — AB (ref 0.1–1.0)
Monocytes Relative: 10 %
Neutro Abs: 9.6 10*3/uL — ABNORMAL HIGH (ref 1.7–7.7)
Neutrophils Relative %: 80 %
PLATELETS: 268 10*3/uL (ref 150–400)
RBC: 3.11 MIL/uL — AB (ref 3.87–5.11)
RDW: 15 % (ref 11.5–15.5)
WBC: 12 10*3/uL — AB (ref 4.0–10.5)

## 2017-11-17 LAB — BASIC METABOLIC PANEL
Anion gap: 9 (ref 5–15)
BUN: 19 mg/dL (ref 6–20)
CALCIUM: 9.6 mg/dL (ref 8.9–10.3)
CHLORIDE: 101 mmol/L (ref 101–111)
CO2: 28 mmol/L (ref 22–32)
CREATININE: 0.89 mg/dL (ref 0.44–1.00)
Glucose, Bld: 111 mg/dL — ABNORMAL HIGH (ref 65–99)
Potassium: 4.3 mmol/L (ref 3.5–5.1)
SODIUM: 138 mmol/L (ref 135–145)

## 2017-11-17 LAB — MAGNESIUM: Magnesium: 1.7 mg/dL (ref 1.7–2.4)

## 2017-11-17 LAB — BRAIN NATRIURETIC PEPTIDE: B NATRIURETIC PEPTIDE 5: 55 pg/mL (ref 0.0–100.0)

## 2017-11-17 LAB — I-STAT CG4 LACTIC ACID, ED: Lactic Acid, Venous: 3.25 mmol/L (ref 0.5–1.9)

## 2017-11-17 LAB — TROPONIN I: Troponin I: <0.03 ng/mL (ref <0.03)

## 2017-11-17 LAB — PHOSPHORUS: PHOSPHORUS: 4 mg/dL (ref 2.5–4.6)

## 2017-11-17 MED ORDER — IRBESARTAN 150 MG PO TABS
150.0000 mg | ORAL_TABLET | Freq: Every day | ORAL | Status: DC
  Administered 2017-11-18: 150 mg via ORAL
  Filled 2017-11-17: qty 1

## 2017-11-17 MED ORDER — DICYCLOMINE HCL 10 MG PO CAPS
10.0000 mg | ORAL_CAPSULE | Freq: Four times a day (QID) | ORAL | Status: DC | PRN
  Administered 2017-11-18: 10 mg via ORAL
  Filled 2017-11-17 (×2): qty 1

## 2017-11-17 MED ORDER — DOXAZOSIN MESYLATE 2 MG PO TABS
4.0000 mg | ORAL_TABLET | Freq: Every day | ORAL | Status: DC
  Administered 2017-11-18 – 2017-11-21 (×4): 4 mg via ORAL
  Filled 2017-11-17 (×4): qty 2

## 2017-11-17 MED ORDER — SODIUM CHLORIDE 0.45 % IV SOLN
INTRAVENOUS | Status: DC
  Administered 2017-11-17: via INTRAVENOUS

## 2017-11-17 MED ORDER — ATORVASTATIN CALCIUM 40 MG PO TABS
40.0000 mg | ORAL_TABLET | Freq: Every day | ORAL | Status: DC
  Administered 2017-11-17 – 2017-11-20 (×4): 40 mg via ORAL
  Filled 2017-11-17 (×4): qty 1

## 2017-11-17 MED ORDER — AMLODIPINE BESYLATE 5 MG PO TABS: 5.0000 mg | ORAL_TABLET | Freq: Every day | ORAL | Status: DC

## 2017-11-17 MED ORDER — LEVOFLOXACIN IN D5W 750 MG/150ML IV SOLN
750.0000 mg | Freq: Once | INTRAVENOUS | Status: AC
  Administered 2017-11-17: 750 mg via INTRAVENOUS
  Filled 2017-11-17: qty 150

## 2017-11-17 MED ORDER — DIPHENHYDRAMINE HCL 25 MG PO CAPS
25.0000 mg | ORAL_CAPSULE | Freq: Four times a day (QID) | ORAL | Status: DC | PRN
  Administered 2017-11-18 – 2017-11-19 (×3): 25 mg via ORAL
  Filled 2017-11-17 (×3): qty 1

## 2017-11-17 MED ORDER — ASPIRIN EC 81 MG PO TBEC
81.0000 mg | DELAYED_RELEASE_TABLET | Freq: Every day | ORAL | Status: DC
  Administered 2017-11-18 – 2017-11-21 (×4): 81 mg via ORAL
  Filled 2017-11-17 (×4): qty 1

## 2017-11-17 MED ORDER — SODIUM CHLORIDE 0.45 % IV BOLUS
1000.0000 mL | Freq: Once | INTRAVENOUS | Status: AC
  Administered 2017-11-17: 1000 mL via INTRAVENOUS

## 2017-11-17 MED ORDER — EXENATIDE 10 MCG/0.04ML ~~LOC~~ SOPN
10.0000 ug | PEN_INJECTOR | Freq: Two times a day (BID) | SUBCUTANEOUS | Status: DC
  Administered 2017-11-19 – 2017-11-21 (×6): 10 ug via SUBCUTANEOUS
  Filled 2017-11-17: qty 2.4

## 2017-11-17 MED ORDER — INSULIN ASPART 100 UNIT/ML ~~LOC~~ SOLN
0.0000 [IU] | Freq: Three times a day (TID) | SUBCUTANEOUS | Status: DC
  Administered 2017-11-18: 4 [IU] via SUBCUTANEOUS
  Administered 2017-11-18 (×2): 7 [IU] via SUBCUTANEOUS
  Administered 2017-11-19: 4 [IU] via SUBCUTANEOUS
  Administered 2017-11-19: 3 [IU] via SUBCUTANEOUS
  Administered 2017-11-19: 11 [IU] via SUBCUTANEOUS
  Administered 2017-11-20: 3 [IU] via SUBCUTANEOUS
  Administered 2017-11-20: 4 [IU] via SUBCUTANEOUS
  Administered 2017-11-20: 3 [IU] via SUBCUTANEOUS
  Administered 2017-11-21: 11 [IU] via SUBCUTANEOUS
  Administered 2017-11-21: 3 [IU] via SUBCUTANEOUS

## 2017-11-17 MED ORDER — ALBUTEROL SULFATE (2.5 MG/3ML) 0.083% IN NEBU
2.5000 mg | INHALATION_SOLUTION | RESPIRATORY_TRACT | Status: DC | PRN
  Administered 2017-11-18: 2.5 mg via RESPIRATORY_TRACT
  Filled 2017-11-17: qty 3

## 2017-11-17 MED ORDER — TEMAZEPAM 15 MG PO CAPS
15.0000 mg | ORAL_CAPSULE | Freq: Every evening | ORAL | Status: DC | PRN
  Administered 2017-11-17 – 2017-11-20 (×4): 15 mg via ORAL
  Filled 2017-11-17 (×4): qty 1

## 2017-11-17 MED ORDER — ENOXAPARIN SODIUM 40 MG/0.4ML ~~LOC~~ SOLN
40.0000 mg | SUBCUTANEOUS | Status: DC
  Administered 2017-11-17 – 2017-11-20 (×4): 40 mg via SUBCUTANEOUS
  Filled 2017-11-17 (×4): qty 0.4

## 2017-11-17 MED ORDER — ALBUTEROL (5 MG/ML) CONTINUOUS INHALATION SOLN
10.0000 mg/h | INHALATION_SOLUTION | Freq: Once | RESPIRATORY_TRACT | Status: AC
  Administered 2017-11-17: 10 mg/h via RESPIRATORY_TRACT
  Filled 2017-11-17: qty 20

## 2017-11-17 MED ORDER — PANTOPRAZOLE SODIUM 40 MG PO TBEC
40.0000 mg | DELAYED_RELEASE_TABLET | Freq: Two times a day (BID) | ORAL | Status: DC
  Administered 2017-11-17 – 2017-11-21 (×8): 40 mg via ORAL
  Filled 2017-11-17 (×8): qty 1

## 2017-11-17 MED ORDER — PROMETHAZINE HCL 25 MG/ML IJ SOLN: 12.5000 mg | Freq: Once | INTRAMUSCULAR | Status: DC

## 2017-11-17 MED ORDER — CHLORTHALIDONE 25 MG PO TABS
25.0000 mg | ORAL_TABLET | Freq: Every day | ORAL | Status: DC
  Filled 2017-11-17: qty 1

## 2017-11-17 MED ORDER — INSULIN GLARGINE 100 UNIT/ML ~~LOC~~ SOLN
26.0000 [IU] | Freq: Every morning | SUBCUTANEOUS | Status: DC
  Administered 2017-11-18 – 2017-11-21 (×4): 26 [IU] via SUBCUTANEOUS
  Filled 2017-11-17 (×6): qty 0.26

## 2017-11-17 MED ORDER — IPRATROPIUM BROMIDE 0.02 % IN SOLN
1.0000 mg | Freq: Once | RESPIRATORY_TRACT | Status: AC
  Administered 2017-11-17: 1 mg via RESPIRATORY_TRACT
  Filled 2017-11-17: qty 5

## 2017-11-17 MED ORDER — METHYLPREDNISOLONE SODIUM SUCC 125 MG IJ SOLR
125.0000 mg | Freq: Once | INTRAMUSCULAR | Status: AC
  Administered 2017-11-17: 125 mg via INTRAVENOUS
  Filled 2017-11-17: qty 2

## 2017-11-17 MED ORDER — KETOROLAC TROMETHAMINE 30 MG/ML IJ SOLN: 15.0000 mg | Freq: Once | INTRAMUSCULAR | Status: DC

## 2017-11-17 MED ORDER — LEVOFLOXACIN IN D5W 750 MG/150ML IV SOLN: 750.0000 mg | INTRAVENOUS | Status: DC

## 2017-11-17 MED ORDER — MAGNESIUM SULFATE 2 GM/50ML IV SOLN
2.0000 g | Freq: Once | INTRAVENOUS | Status: AC
  Administered 2017-11-17: 2 g via INTRAVENOUS
  Filled 2017-11-17: qty 50

## 2017-11-17 MED ORDER — MONTELUKAST SODIUM 10 MG PO TABS
10.0000 mg | ORAL_TABLET | Freq: Every day | ORAL | Status: DC
  Administered 2017-11-17 – 2017-11-20 (×4): 10 mg via ORAL
  Filled 2017-11-17 (×4): qty 1

## 2017-11-17 MED ORDER — BACLOFEN 10 MG PO TABS
5.0000 mg | ORAL_TABLET | Freq: Every day | ORAL | Status: DC | PRN
  Filled 2017-11-17: qty 1

## 2017-11-17 MED ORDER — BENZONATATE 100 MG PO CAPS
100.0000 mg | ORAL_CAPSULE | ORAL | Status: DC | PRN
  Administered 2017-11-18 – 2017-11-21 (×9): 100 mg via ORAL
  Filled 2017-11-17 (×9): qty 1

## 2017-11-17 MED ORDER — GUAIFENESIN-DM 100-10 MG/5ML PO SYRP
10.0000 mL | ORAL_SOLUTION | ORAL | Status: DC | PRN
  Administered 2017-11-17 – 2017-11-19 (×4): 10 mL via ORAL
  Filled 2017-11-17 (×4): qty 10

## 2017-11-17 MED ORDER — LORAZEPAM 1 MG PO TABS
1.0000 mg | ORAL_TABLET | Freq: Two times a day (BID) | ORAL | Status: DC | PRN
  Administered 2017-11-18: 1 mg via ORAL
  Filled 2017-11-17 (×2): qty 1

## 2017-11-17 MED ORDER — IRBESARTAN 75 MG PO TABS: 75.0000 mg | ORAL_TABLET | Freq: Every day | ORAL | Status: DC

## 2017-11-17 MED ORDER — PROMETHAZINE HCL 12.5 MG PO TABS
12.5000 mg | ORAL_TABLET | Freq: Four times a day (QID) | ORAL | Status: DC | PRN
  Administered 2017-11-18 (×2): 12.5 mg via ORAL
  Filled 2017-11-17 (×2): qty 1

## 2017-11-17 MED ORDER — IPRATROPIUM-ALBUTEROL 0.5-2.5 (3) MG/3ML IN SOLN
3.0000 mL | Freq: Four times a day (QID) | RESPIRATORY_TRACT | Status: DC | PRN
  Administered 2017-11-18: 3 mL via RESPIRATORY_TRACT
  Filled 2017-11-17: qty 3

## 2017-11-17 MED ORDER — MAGNESIUM OXIDE 400 MG PO TABS
400.0000 mg | ORAL_TABLET | Freq: Every day | ORAL | Status: DC
  Administered 2017-11-18 – 2017-11-21 (×4): 400 mg via ORAL
  Filled 2017-11-17 (×12): qty 1

## 2017-11-17 MED ORDER — SERTRALINE HCL 50 MG PO TABS
50.0000 mg | ORAL_TABLET | Freq: Every day | ORAL | Status: DC
  Administered 2017-11-18 – 2017-11-21 (×4): 50 mg via ORAL
  Filled 2017-11-17 (×4): qty 1

## 2017-11-17 NOTE — ED Provider Notes (Signed)
[facility] EMERGENCY DEPARTMENT Provider Note   CSN: [id number] Arrival date & time: 11/17/17  1643     History   Chief Complaint Chief Complaint  Patient presents with  . Cough    HPI Ann Fuller is a 71 y.o. female.  HPI  Pt was seen at 1715.   Per pt, c/o gradual onset and worsening of persistent cough, wheezing and SOB for the past 5 days.  Describes her symptoms as "my asthma is acting up."  Has been using home MDI and nebs without relief. Has been associated with lower ribs "pain" for the past 4 to 5 days. Describes the CP as "sore" and "from all the coughing."  Denies palpitations, no back pain, no abd pain, no N/V/D, no fevers, no rash.    Past Medical History:  Diagnosis Date  . Adenomatous colon polyp   . Arthritis   . Asthma   . Breast cancer ([place]) [id number]  . Bulging lumbar disc   . Depression   . Diverticulitis   . Dyspepsia   . Essential hypertension, benign   . GERD (gastroesophageal reflux disease)   . Heart murmur   . IBS (irritable bowel syndrome)   . Internal hemorrhoid   . PONV (postoperative nausea and vomiting)   . Right shoulder pain   . Shortness of breath dyspnea    when walking a lot  . Tubular adenoma 11/2010  . Type 2 diabetes mellitus [facility])     Patient Active Problem List   Diagnosis Date Noted  . Abdominal pain, LLQ 01/22/2017  . LUQ abdominal pain 01/22/2017  . Normocytic anemia 01/22/2017  . Cough 09/10/2016  . Dilation of biliary tract 09/10/2016  . Breast pain, right 08/06/2016  . CAP (community acquired pneumonia) 04/29/2016  . History of adenomatous polyp of colon   . Diverticulosis of colon without hemorrhage   . Adenomatous polyp of colon 10/17/2015  . Upper airway cough syndrome 10/10/2015  . Morbid obesity ([place]) 10/10/2015  . Diastolic dysfunction 50/35/4656  . Insomnia 04/06/2015  . Cough variant asthma 01/22/2015  . IBS (irritable bowel syndrome) 08/25/2014  . Syncope 03/08/2014  . Murmur, cardiac  03/08/2014  . Type 2 diabetes mellitus ([place]) 03/05/2014  . Essential hypertension, benign 03/05/2014  . GERD (gastroesophageal reflux disease) 11/23/2011  . Diarrhea 01/25/2011  . CARCINOMA, BREAST, HX OF 11/21/2010    Past Surgical History:  Procedure Laterality Date  . ABDOMINAL HYSTERECTOMY    . APPENDECTOMY    . BACK SURGERY  2007   Lumbar fusion  . CATARACT EXTRACTION W/PHACO Left 08/27/2013   Procedure: CATARACT EXTRACTION PHACO AND INTRAOCULAR LENS PLACEMENT (IOC);  Surgeon: Tonny Branch, MD;  Location: [facility];  Service: Ophthalmology;  Laterality: Left;  CDE 4.25  . CATARACT EXTRACTION W/PHACO Right 09/24/2013   Procedure: CATARACT EXTRACTION PHACO AND INTRAOCULAR LENS PLACEMENT (IOC);  Surgeon: Tonny Branch, MD;  Location: [facility];  Service: Ophthalmology;  Laterality: Right;  CDE:  8.16  . CHOLECYSTECTOMY    . COLONOSCOPY  12/13/2010   Dr. Margarito Courser pancolonic diverticular.tubular adenoma  . COLONOSCOPY  07/13/2003  . COLONOSCOPY N/A 10/28/2015   CLE:XNTZGYF diverticulosis, multiple tubular adenomas removed. next tcs 10/2020.   . ESOPHAGOGASTRODUODENOSCOPY  12/13/2010   Dr. Jennet Maduro hernia, fundal gland type polyps  . EYE SURGERY Left    Left KPE 08/27/13  . MASTECTOMY  1995  . NASAL ENDOSCOPY WITH EPISTAXIS CONTROL Left 07/16/2016   Procedure: ENDOSCOPIC LEFT NASAL CAUTERY;  Surgeon: Leta Baptist, MD;  Location: [place];  Service: ENT;  Laterality: Left;  . TONSILLECTOMY    . TUBAL LIGATION      OB History    No data available       Home Medications    Prior to Admission medications   Medication Sig Start Date End Date Taking? Authorizing Provider  amLODipine (NORVASC) 5 MG tablet Take 5 mg by mouth daily.   Yes [provider]  aspirin EC 81 MG tablet Take 81 mg by mouth daily.   Yes [provider]  atorvastatin (LIPITOR) 40 MG tablet Take 40 mg by mouth at bedtime.    Yes [provider]  baclofen (LIORESAL) 10 MG  tablet Take 5 mg by mouth daily as needed for muscle spasms.    Yes [provider]  benzonatate (TESSALON) 200 MG capsule TAKE ONE CAPSULE BY MOUTH EVERY 6 HOURS FOR COUGH Patient taking differently: TAKE ONE CAPSULE BY MOUTH EVERY 6 HOURS FOR COUGH. Takes as needed 08/28/16  Yes Tanda Rockers, MD  budesonide-formoterol (SYMBICORT) 80-4.5 MCG/ACT inhaler Inhale 2 puffs into the lungs 2 (two) times daily. 08/31/16  Yes Tanda Rockers, MD  chlorpheniramine (CHLOR-TRIMETON) 4 MG tablet Take 4 mg by mouth every 4 (four) hours as needed (drippy nose, drainage, throat clearing).    Yes [provider]  chlorthalidone (HYGROTON) 25 MG tablet Take 25 mg by mouth every morning.    Yes [provider]  dicyclomine (BENTYL) 10 MG capsule TAKE ONE CAPSULE BY MOUTH PRIOR TO MEALSAS NEEDED. NO MORE THAN FOUR TIMES DAILY. MAY CAUSE DROWINESS. 04/28/17  Yes Mahala Menghini, PA-C  doxazosin (CARDURA) 2 MG tablet Take 4 mg by mouth every morning.    Yes [provider]  exenatide (BYETTA 10 MCG PEN) 10 MCG/0.04ML SOLN Inject 10 mcg into the skin 2 (two) times daily with a meal.    Yes [provider]  Insulin Glargine (LANTUS) 100 UNIT/ML Solostar Pen Inject 26 Units into the skin every morning.    Yes [provider]  Insulin Lispro (HUMALOG KWIKPEN [place]) Inject 6 Units into the skin 2 (two) times daily.    Yes [provider]  ipratropium-albuterol (DUONEB) 0.5-2.5 (3) MG/3ML SOLN Take 3 mLs by nebulization every 4 (four) hours. Patient taking differently: Take 3 mLs by nebulization every 6 (six) hours as needed (wheezing and shortness of breath).  04/10/15  Yes Asencion Noble, MD  LORazepam (ATIVAN) 1 MG tablet Take 1 mg by mouth 2 (two) times daily as needed. Anxiety/Sleep   Yes [provider]  magnesium oxide (MAG-OX) 400 MG tablet Take 400 mg by mouth daily.   Yes [provider]  montelukast (SINGULAIR) 10 MG tablet TAKE ONE TABLET BY MOUTH  EVERY NIGHT AT BEDTIME 11/20/16  Yes Tanda Rockers, MD  olmesartan (BENICAR) 20 MG tablet Take 20 mg by mouth daily.   Yes [provider]  pantoprazole (PROTONIX) 40 MG tablet TAKE ONE TABLET TWICE DAILY BEFORE A MEAL 04/28/17  Yes Mahala Menghini, PA-C  sertraline (ZOLOFT) 50 MG tablet Take 50 mg by mouth.   Yes [provider]  temazepam (RESTORIL) 15 MG capsule Take 15 mg by mouth at bedtime as needed for sleep.   Yes [provider]  vitamin C (ASCORBIC ACID) 500 MG tablet Take 500 mg by mouth daily as needed.    Yes [provider]  Spacer/Aero-Holding Chambers (AEROCHAMBER MV) inhaler Use as instructed 08/31/16   Wert,  Christena Deem, MD    Family History Family History  Problem Relation Age of Onset  . Colon cancer Mother   . Heart attack Father   . Heart attack Sister   . Stroke Sister   . Cancer - Lung Sister     Social History Social History   Tobacco Use  . Smoking status: Never Smoker  . Smokeless tobacco: Never Used  Substance Use Topics  . Alcohol use: No  . Drug use: No     Allergies   Ceftin [cefuroxime axetil]; Codeine; Metformin and related; Nexium [esomeprazole magnesium]; Omnicef [cefdinir]; and Sulfa antibiotics   Review of Systems Review of Systems ROS: Statement: All systems negative except as marked or noted in the HPI; Constitutional: Negative for fever and chills. ; ; Eyes: Negative for eye pain, redness and discharge. ; ; ENMT: Negative for ear pain, hoarseness, nasal congestion, sinus pressure and sore throat. ; ; Cardiovascular: Negative for chest pain, palpitations, diaphoresis, and peripheral edema. ; ; Respiratory: +cough, wheezing, SOB. Negative for stridor. ; ; Gastrointestinal: Negative for nausea, vomiting, diarrhea, abdominal pain, blood in stool, hematemesis, jaundice and rectal bleeding. . ; ; Genitourinary: Negative for dysuria, flank pain and hematuria. ; ; Musculoskeletal: Negative for back pain and neck  pain. Negative for swelling and trauma.; ; Skin: Negative for pruritus, rash, abrasions, blisters, bruising and skin lesion.; ; Neuro: Negative for headache, lightheadedness and neck stiffness. Negative for weakness, altered level of consciousness, altered mental status, extremity weakness, paresthesias, involuntary movement, seizure and syncope.       Physical Exam Updated Vital Signs BP (!) 158/49 (BP Location: Right Arm)   Pulse (!) 107   Temp 98.2 F (36.8 C) (Oral)   Resp 20   Ht 4\' 11"  (1.499 m)   Wt 107 kg (236 lb)   SpO2 93%   BMI 47.67 kg/m   Physical Exam 1720: Physical examination:  Nursing notes reviewed; Vital signs and O2 SAT reviewed;  Constitutional: Well developed, Well nourished, Well hydrated, In no acute distress; Head:  Normocephalic, atraumatic; Eyes: EOMI, PERRL, No scleral icterus; ENMT: Mouth and pharynx normal, Mucous membranes moist; Neck: Supple, Full range of motion, No lymphadenopathy; Cardiovascular: Tachycardic rate and rhythm, No gallop; Respiratory: Breath sounds diminished & equal bilaterally, scattered wheezes. No audible wheezing. +frequent non-productive cough during exam. Speaking full sentences, sitting upright, tachypneic, Normal respiratory effort/excursion; Chest: +lower anterior and lateral chest wall tenderness bilat. No deformity, no soft tissue crepitus. Movement normal; Abdomen: Soft, Nontender, Nondistended, Normal bowel sounds; Genitourinary: No CVA tenderness; Extremities: Pulses normal, No tenderness, No edema, No calf edema or asymmetry.; Neuro: AA&Ox3, Major CN grossly intact.  Speech clear. No gross focal motor or sensory deficits in extremities.; Skin: Color normal, Warm, Dry.    ED Treatments / Results  Labs (all labs ordered are listed, but only abnormal results are displayed)   EKG  EKG Interpretation  Date/Time:  Sunday November 17 2017 17:37:40 EST Ventricular Rate:  102 PR Interval:    QRS Duration: 75 QT  Interval:  350 QTC Calculation: 456 R Axis:   40 Text Interpretation:  Sinus tachycardia Atrial premature complex Baseline wander When compared with ECG of 07/12/2016 Rate faster Otherwise no significant change Confirmed by Francine Graven [phone]) on 11/17/2017 5:50:49 PM       Radiology   Procedures Procedures (including critical care time)  Medications Ordered in ED Medications  methylPREDNISolone sodium succinate (SOLU-MEDROL) 125 mg/2 mL injection 125 mg (not administered)  albuterol (PROVENTIL,VENTOLIN) solution  continuous neb (not administered)  ipratropium (ATROVENT) nebulizer solution 1 mg (not administered)     Initial Impression / Assessment and Plan / ED Course  I have reviewed the triage vital signs and the nursing notes.  Pertinent labs & imaging results that were available during my care of the patient were reviewed by me and considered in my medical decision making (see chart for details).  MDM Reviewed: previous chart, nursing note and vitals Reviewed previous: labs and ECG Interpretation: labs, ECG and x-ray Total time providing critical care: 30-74 minutes. This excludes time spent performing separately reportable procedures and services. Consults: admitting MD   CRITICAL CARE Performed by: Alfonzo Feller Total critical care time: 35 minutes Critical care time was exclusive of separately billable procedures and treating other patients. Critical care was necessary to treat or prevent imminent or life-threatening deterioration. Critical care was time spent personally by me on the following activities: development of treatment plan with patient and/or surrogate as well as nursing, discussions with consultants, evaluation of patient's response to treatment, examination of patient, obtaining history from patient or surrogate, ordering and performing treatments and interventions, ordering and review of laboratory studies, ordering and review of radiographic  studies, pulse oximetry and re-evaluation of patient's condition.   Results for orders placed or performed during the hospital encounter of 01/01/31  Basic metabolic panel  Result Value Ref Range   Sodium 138 135 - 145 mmol/L   Potassium 4.3 3.5 - 5.1 mmol/L   Chloride 101 101 - 111 mmol/L   CO2 28 22 - 32 mmol/L   Glucose, Bld 111 (H) 65 - 99 mg/dL   BUN 19 6 - 20 mg/dL   Creatinine, Ser 0.89 0.44 - 1.00 mg/dL   Calcium 9.6 8.9 - 10.3 mg/dL   GFR calc non Af Amer >60 >60 mL/min   GFR calc Af Amer >60 >60 mL/min   Anion gap 9 5 - 15  Troponin I  Result Value Ref Range   Troponin I <0.03 <0.03 ng/mL  CBC with Differential  Result Value Ref Range   WBC 12.0 (H) 4.0 - 10.5 K/uL   RBC 3.11 (L) 3.87 - 5.11 MIL/uL   Hemoglobin 9.1 (L) 12.0 - 15.0 g/dL   HCT 29.0 (L) 36.0 - 46.0 %   MCV 93.2 78.0 - 100.0 fL   MCH 29.3 26.0 - 34.0 pg   MCHC 31.4 30.0 - 36.0 g/dL   RDW 15.0 11.5 - 15.5 %   Platelets 268 150 - 400 K/uL   Neutrophils Relative % 80 %   Neutro Abs 9.6 (H) 1.7 - 7.7 K/uL   Lymphocytes Relative 9 %   Lymphs Abs 1.1 0.7 - 4.0 K/uL   Monocytes Relative 10 %   Monocytes Absolute 1.2 (H) 0.1 - 1.0 K/uL   Eosinophils Relative 1 %   Eosinophils Absolute 0.1 0.0 - 0.7 K/uL   Basophils Relative 0 %   Basophils Absolute 0.0 0.0 - 0.1 K/uL   Dg Chest Port 1 View Result Date: 11/17/2017 CLINICAL DATA:  Cough.  Shortness of breath. EXAM: PORTABLE CHEST 1 VIEW COMPARISON:  January 05, 2017 FINDINGS: No pneumothorax. The heart size is borderline. The hila and mediastinum are normal. Bilateral hazy pulmonary infiltrates, left greater than right, particularly in the bases. No nodules or masses. No other acute abnormalities. IMPRESSION: Bilateral hazy infiltrates may represent multifocal pneumonia. Asymmetric edema is possible as well. Recommend clinical correlation and follow-up to resolution. Electronically Signed   By: Dorise Bullion  III M.D   On: 11/17/2017 19:32    2020:  On  arrival: pt sitting upright, tachypneic, tachycardic, Sats 93 % R/A, lungs diminished with wheezing. IV solumedrol and hour long neb started. After neb: pt appears more comfortable at rest, less tachypneic, Sats 95-100 % on R/A, lungs continue diminished. Pt ambulated in hallway: pt's O2 Sats dropped to 88 % R/A with pt c/o increasing SOB, with increasing HR to 150's and RR. Pt escorted back to stretcher with O2 Sats slowly increasing to 100 % on O2 2L N/C. Will dose IV levaquin, obtain BNP, admit. Dx and testing d/w pt and family.  Questions answered.  Verb understanding, agreeable to admit.  T/C to [facility] Dr. Olevia Bowens, case discussed, including:  HPI, pertinent PM/SHx, VS/PE, dx testing, ED course and treatment:  Agreeable to admit.        Final Clinical Impressions(s) / ED Diagnoses   Final diagnoses:  None    ED Discharge Orders    None       Francine Graven, DO 11/20/17 1903

## 2017-11-17 NOTE — ED Notes (Signed)
I STAT Lactic acid had brackets indicating a bad sample- Dr Olevia Bowens aware and new order received for lab to draw lactic acid. Tori, phlebotomist made aware.

## 2017-11-17 NOTE — H&P (Signed)
History and Physical    [name] YNW:[id number] DOB: 07-Sep-1946 DOA: 11/17/2017  PCP: Asencion Noble, MD  Pulmonologist: Christinia Gully, MD.  Patient coming from: Home.  I have personally briefly reviewed patient's old medical records in [place]  Chief Complaint: Shortness of breath and dry cough.  HPI: Ann Fuller is a 71 y.o. female with medical history significant of colon polyps, arthritis, adult onset asthma, history of breast cancer (1991, 1995), bulging lumbar disc, depression, diverticulosis, dyspepsia, GERD, hypertension, IBS, hemorrhoids, tubular adenoma, type 2 diabetes who is coming to the emergency department with progressively worse dyspnea associated with persistent nonproductive cough that occasionally induces nausea and wheezing for the past 5 days despite using her albuterol MDI and nebulizer solution at home.  She mentions that the the frequent coughing has given her pain in her lower ribs and she feels very fatigued.  She denies fever, chills, night sweats, rhinorrhea, hemoptysis, diaphoresis, dizziness, PND, orthopnea, abdominal pain, constipation, melena or hematochezia.  She denies dysuria, frequency or hematuria.  Denies sick contacts to her knowledge, travel history, environmental allergens, tobacco smoke or industrial fumes exposure.   ED Course: Initial vital signs in the emergency department were temperature 36.8C, pulse 107, respirations 20, blood pressure 150/49 mmHg and O2 sat 93% on room air.  She received a continuous 10 mg albuterol nebulization with 1 mg of Atrovent, Solu-Medrol 125 mg IVP along with supplemental oxygen.  I added Phenergan 12.5 mg, Tessalon Perle for cough and Toradol 15 mg IVP x1 for rib cage pain.  Her CBC shows a WBC of 12.0 with 80% neutrophils, hemoglobin 9.1 g/dL and platelets 268.  Troponin x2 and BNP level were normal.  BMP showed a nonfasting glucose of 111 mg/dL, otherwise all other values were normal.  Phosphorus  4.0 and magnesium 1.7 mg/dL.  EKG was sinus tachycardia with APCs, baseline wander.  Her chest radiograph showed  bilateral hazy infiltrates consistent with multifocal pneumonia versus asymmetric edema.  Please image and full radiology report for further detail.   PFTs done in February 2017 showed decreased pulmonary volumes.  Please see attached graph after chest radiograph report below.  Review of Systems: As per HPI otherwise 10 point review of systems negative.    Past Medical History:  Diagnosis Date  . Adenomatous colon polyp   . Arthritis   . Asthma   . Breast cancer ([place]) [id number]  . Bulging lumbar disc   . Depression   . Diverticulitis   . Dyspepsia   . Essential hypertension, benign   . GERD (gastroesophageal reflux disease)   . Heart murmur   . IBS (irritable bowel syndrome)   . Internal hemorrhoid   . PONV (postoperative nausea and vomiting)   . Right shoulder pain   . Shortness of breath dyspnea    when walking a lot  . Tubular adenoma 11/2010  . Type 2 diabetes mellitus ([place])     Past Surgical History:  Procedure Laterality Date  . ABDOMINAL HYSTERECTOMY    . APPENDECTOMY    . BACK SURGERY  2007   Lumbar fusion  . CATARACT EXTRACTION W/PHACO Left 08/27/2013   Procedure: CATARACT EXTRACTION PHACO AND INTRAOCULAR LENS PLACEMENT (IOC);  Surgeon: Tonny Branch, MD;  Location: [facility];  Service: Ophthalmology;  Laterality: Left;  CDE 4.25  . CATARACT EXTRACTION W/PHACO Right 09/24/2013   Procedure: CATARACT EXTRACTION PHACO AND INTRAOCULAR LENS PLACEMENT (IOC);  Surgeon: Tonny Branch, MD;  Location: [facility];  Service: Ophthalmology;  Laterality:  Right;  CDE:  8.16  . CHOLECYSTECTOMY    . COLONOSCOPY  12/13/2010   Dr. Margarito Courser pancolonic diverticular.tubular adenoma  . COLONOSCOPY  07/13/2003  . COLONOSCOPY N/A 10/28/2015   EUM:PNTIRWE diverticulosis, multiple tubular adenomas removed. next tcs 10/2020.   . ESOPHAGOGASTRODUODENOSCOPY  12/13/2010   Dr. Jennet Maduro  hernia, fundal gland type polyps  . EYE SURGERY Left    Left KPE 08/27/13  . MASTECTOMY  1995  . NASAL ENDOSCOPY WITH EPISTAXIS CONTROL Left 07/16/2016   Procedure: ENDOSCOPIC LEFT NASAL CAUTERY;  Surgeon: Leta Baptist, MD;  Location: [facility];  Service: ENT;  Laterality: Left;  . TONSILLECTOMY    . TUBAL LIGATION       reports that  has never smoked. she has never used smokeless tobacco. She reports that she does not drink alcohol or use drugs.  Allergies  Allergen Reactions  . Ceftin [Cefuroxime Axetil] Swelling  . Codeine Swelling    Tongue swells  . Metformin And Related Diarrhea  . Nexium [Esomeprazole Magnesium] Diarrhea  . Omnicef [Cefdinir] Diarrhea  . Sulfa Antibiotics Rash    Family History  Problem Relation Age of Onset  . Colon cancer Mother   . Heart attack Father   . Heart attack Sister   . Stroke Sister   . Cancer - Lung Sister     Prior to Admission medications   Medication Sig Start Date End Date Taking? Authorizing Provider  albuterol (PROVENTIL HFA;VENTOLIN HFA) 108 (90 Base) MCG/ACT inhaler Inhale 2 puffs into the lungs every 4 (four) hours as needed for wheezing or shortness of breath.   Yes [provider]  amLODipine (NORVASC) 5 MG tablet Take 5 mg by mouth daily.   Yes [provider]  aspirin EC 81 MG tablet Take 81 mg by mouth daily.   Yes [provider]  atorvastatin (LIPITOR) 40 MG tablet Take 40 mg by mouth at bedtime.    Yes [provider]  baclofen (LIORESAL) 10 MG tablet Take 5 mg by mouth daily as needed for muscle spasms.    Yes [provider]  benzonatate (TESSALON) 200 MG capsule TAKE ONE CAPSULE BY MOUTH EVERY 6 HOURS FOR COUGH Patient taking differently: TAKE ONE CAPSULE BY MOUTH EVERY 6 HOURS FOR COUGH. Takes as needed 08/28/16  Yes Tanda Rockers, MD  budesonide-formoterol (SYMBICORT) 80-4.5 MCG/ACT inhaler Inhale 2 puffs into the lungs 2 (two) times daily. 08/31/16  Yes Tanda Rockers, MD  chlorpheniramine (CHLOR-TRIMETON) 4 MG tablet Take 4 mg by mouth every 4 (four) hours as needed (drippy nose, drainage, throat clearing).    Yes [provider]  chlorthalidone (HYGROTON) 25 MG tablet Take 25 mg by mouth every morning.    Yes [provider]  dicyclomine (BENTYL) 10 MG capsule TAKE ONE CAPSULE BY MOUTH PRIOR TO MEALSAS NEEDED. NO MORE THAN FOUR TIMES DAILY. MAY CAUSE DROWINESS. 04/28/17  Yes Mahala Menghini, PA-C  doxazosin (CARDURA) 2 MG tablet Take 4 mg by mouth every morning.    Yes [provider]  exenatide (BYETTA 10 MCG PEN) 10 MCG/0.04ML SOLN Inject 10 mcg into the skin 2 (two) times daily with a meal.    Yes [provider]  Insulin Glargine (LANTUS) 100 UNIT/ML Solostar Pen Inject 26 Units into the skin every morning.    Yes [provider]  Insulin Lispro (HUMALOG KWIKPEN [place]) Inject 6 Units into the skin 2 (two) times daily.    Yes [provider]  ipratropium-albuterol (DUONEB) 0.5-2.5 (3) MG/3ML SOLN Take 3 mLs by nebulization every 4 (four) hours. Patient taking differently: Take 3 mLs by nebulization every 6 (six) hours as needed (wheezing and shortness of breath).  04/10/15  Yes Asencion Noble, MD  LORazepam (ATIVAN) 1 MG tablet Take 1 mg by mouth 2 (two) times daily as needed. Anxiety/Sleep   Yes [provider]  magnesium oxide (MAG-OX) 400 MG tablet Take 400 mg by mouth daily.   Yes [provider]  montelukast (SINGULAIR) 10 MG tablet TAKE ONE TABLET BY MOUTH EVERY NIGHT AT BEDTIME 11/20/16  Yes Tanda Rockers, MD  olmesartan (BENICAR) 20 MG tablet Take 20 mg by mouth daily.   Yes [provider]  pantoprazole (PROTONIX) 40 MG tablet TAKE ONE TABLET TWICE DAILY BEFORE A MEAL 04/28/17  Yes Mahala Menghini, PA-C  sertraline (ZOLOFT) 50 MG tablet Take 50 mg by mouth.   Yes [provider]  temazepam (RESTORIL) 15 MG capsule Take 15 mg by mouth at bedtime as needed for  sleep.   Yes [provider]  vitamin C (ASCORBIC ACID) 500 MG tablet Take 500 mg by mouth daily as needed.    Yes [provider]  Spacer/Aero-Holding Chambers (AEROCHAMBER MV) inhaler Use as instructed 08/31/16   Tanda Rockers, MD    Physical Exam: Vitals:   11/17/17 2030 11/17/17 2045 11/17/17 2100 11/17/17 2206  BP: (!) 142/64  (!) 163/83 (!) 169/111  Pulse: (!) 118 (!) 119 (!) 116 (!) 127  Resp: (!) 23 (!) 30 (!) 21 15  Temp:      TempSrc:      SpO2: 98% (!) 89% 95% 99%  Weight:      Height:        Constitutional: NAD, calm, comfortable Eyes: PERRL, lids and conjunctivae normal ENMT: Mucous membranes are mildly dry. Posterior pharynx clear of any exudate or lesions. Neck: normal, supple, no masses, no thyromegaly Respiratory: Decreased breath sounds with respiratory wheezing bilaterally.  Mildly increased respiratory effort. No accessory muscle use.  Cardiovascular: Tachycardic in the 110s with a regular rhythm, positive 2/6 systolic murmur, no rubs / gallops.  Mild nonpitting lower extremities edema. 2+ pedal pulses. No carotid bruits.   Chest: Positive bilateral lower rib cage tenderness on percussion and with deep inspiration  abdomen: no tenderness, no masses palpated. No hepatosplenomegaly. Bowel sounds positive.  Musculoskeletal: no clubbing / cyanosis. Good ROM, no contractures. Normal muscle tone.  Skin: no significant rashes, lesions, ulcers on limited dermatological examination Neurologic: CN 2-12 grossly intact. Sensation intact, DTR normal. Strength 5/5 in all 4.  Psychiatric: Normal judgment and insight. Alert and oriented x 4.  Mildly anxious mood.    Labs on Admission: I have personally reviewed following labs and imaging studies  CBC: Recent Labs  Lab 11/17/17 1744  WBC 12.0*  NEUTROABS 9.6*  HGB 9.1*  HCT 29.0*  MCV 93.2  PLT 952   Basic Metabolic Panel: Recent Labs  Lab 11/17/17 1744  NA 138  K 4.3  CL 101  CO2 28  GLUCOSE  111*  BUN 19  CREATININE 0.89  CALCIUM 9.6  MG 1.7  PHOS 4.0   GFR: Estimated Creatinine Clearance: 64.7 mL/min (by C-G formula based on SCr of 0.89 mg/dL). Liver Function Tests: No results for input(s): AST, ALT, ALKPHOS, BILITOT, PROT, ALBUMIN in the last 168 hours. No results for input(s): LIPASE, AMYLASE in the last 168 hours. No results for input(s): AMMONIA in the last 168 hours.  Coagulation Profile: No results for input(s): INR, PROTIME in the last 168 hours. Cardiac Enzymes: Recent Labs  Lab 11/17/17 1744 11/17/17 2124  TROPONINI <0.03 <0.03   BNP (last 3 results) No results for input(s): PROBNP in the last 8760 hours. HbA1C: No results for input(s): HGBA1C in the last 72 hours. CBG: No results for input(s): GLUCAP in the last 168 hours. Lipid Profile: No results for input(s): CHOL, HDL, LDLCALC, TRIG, CHOLHDL, LDLDIRECT in the last 72 hours. Thyroid Function Tests: No results for input(s): TSH, T4TOTAL, FREET4, T3FREE, THYROIDAB in the last 72 hours. Anemia Panel: No results for input(s): VITAMINB12, FOLATE, FERRITIN, TIBC, IRON, RETICCTPCT in the last 72 hours. Urine analysis:    Component Value Date/Time   COLORURINE YELLOW 04/06/2015 1935   APPEARANCEUR CLEAR 04/06/2015 1935   LABSPEC 1.015 04/06/2015 1935   PHURINE 5.5 04/06/2015 1935   GLUCOSEU NEGATIVE 04/06/2015 1935   GLUCOSEU NEG mg/dL 11/21/2010 1308   HGBUR TRACE (A) 04/06/2015 1935   BILIRUBINUR NEGATIVE 04/06/2015 1935   KETONESUR NEGATIVE 04/06/2015 1935   PROTEINUR NEGATIVE 04/06/2015 1935   UROBILINOGEN 0.2 04/06/2015 1935   NITRITE NEGATIVE 04/06/2015 1935   LEUKOCYTESUR NEGATIVE 04/06/2015 1935    Radiological Exams on Admission: Dg Chest Port 1 View  Result Date: 11/17/2017 CLINICAL DATA:  Cough.  Shortness of breath. EXAM: PORTABLE CHEST 1 VIEW COMPARISON:  January 05, 2017 FINDINGS: No pneumothorax. The heart size is borderline. The hila and mediastinum are normal. Bilateral hazy  pulmonary infiltrates, left greater than right, particularly in the bases. No nodules or masses. No other acute abnormalities. IMPRESSION: Bilateral hazy infiltrates may represent multifocal pneumonia. Asymmetric edema is possible as well. Recommend clinical correlation and follow-up to resolution. Electronically Signed   By: Dorise Bullion III M.D   On: 11/17/2017 19:32   02/02/2016 pulmonary function test (Full report scanned under procedures tabs)   Ref Range & Units 58yr ago  FVC-Pre L 1.59   FVC-%Pred-Pre % 83   FVC-Post L 1.57   FVC-%Pred-Post % 82   FVC-%Change-Post % 0   FEV1-Pre L 1.33   FEV1-%Pred-Pre % 90   FEV1-Post L 1.35   FEV1-%Pred-Post % 92   FEV1-%Change-Post % 1   FEV6-Pre L 1.59   FEV6-%Pred-Pre % 87   FEV6-Post L 1.57   FEV6-%Pred-Post % 86   FEV6-%Change-Post % -1   Pre FEV1/FVC ratio % 84   FEV1FVC-%Pred-Pre % 107   Post FEV1/FVC ratio % 86   FEV1FVC-%Change-Post % 2   Pre FEV6/FVC Ratio % 100   FEV6FVC-%Pred-Pre % 105   Post FEV6/FVC ratio % 100   FEV6FVC-%Pred-Post % 105   FEV6FVC-%Change-Post % 0   FEF 25-75 Pre L/sec 1.71   FEF2575-%Pred-Pre % 118   FEF 25-75 Post L/sec 1.75   FEF2575-%Pred-Post % 121   FEF2575-%Change-Post % 2   RV L 1.48   RV % pred % 78   TLC L 3.05   TLC % pred % 70   DLCO unc ml/min/mmHg 11.84   DLCO unc % pred % 67   DL/VA  ml/min/mmHg/L 4.02   DL/VA % pred % 98   Resulting Agency  [facility]       03/17/2014 echocardiogram without contrast ------------------------------------------------------------ LV EF: 60% -  65%  ------------------------------------------------------------ Indications:   Murmur 785.2. Syncope 780.2.  ------------------------------------------------------------ History:  PMH:  Murmur. Risk factors: Hypertension. Diabetes mellitus.  ------------------------------------------------------------ Study Conclusions  - Procedure narrative: Transthoracic echocardiography. Image quality  was poor. The study was technically difficult,  as a result of poor sound wave transmission, breast implants, and body habitus. - Left ventricle: The cavity size was normal. Wall thickness was increased in a pattern of moderate LVH. Systolic function was normal. The estimated ejection fraction was in the range of 60% to 65%. There was an increased relative contribution of atrial contraction to ventricular filling. Doppler parameters are consistent with abnormal left ventricular relaxation (grade 1 diastolic dysfunction). - Aortic valve: Poorly visualized. Uncertain number of leaflets. Mildly calcified annulus. Mildly thickened, mildly calcified leaflets. Mild regurgitation. Mild aortic stenosis. Peak velocity: 246cm/s (S). Mean gradient: 51mm Hg (S). Valve area: 1.61cm^2(VTI). Valve area: 1.43cm^2 (Vmax). - Mitral valve: Mildly thickened leaflets . Severe posterior annular calcification. Trivial regurgitation.  EKG: Independently reviewed.  Vent. rate 102 BPM PR interval * ms QRS duration 75 ms QT/QTc 350/456 ms P-R-T axes 63 40 51 Sinus tachycardia Atrial premature complex Baseline wander  Assessment/Plan Principal Problem:   CAP (community acquired pneumonia)   Acute asthma exacerbation Admit to stepdown/inpatient. Continue supplemental oxygen. Continue with scheduled and as needed bronchodilators. She received Solu-Medrol 125 mg IVP in the emergency department. Continue Levaquin 750 mg IVPB every 24 hours. Check sputum Gram stain, culture and sensitivity if able to expectorate. Check strep pneumoniae urinary antigen. Continue Singulair 10 mg p.o. Daily. Tessalon Perles, Robitussin-DM and Phenergan as needed for cough. She denies that she received both Pneumovax and flu vaccine. After discharge, she should probably follow-up with pulmonology.  Active Problems:   Lactic acidosis Likely secondary to albuterol use vs pneumonia and increased  work of breathing. Half NS 1000 mL bolus.  Follow-up lactic acid level.    GERD (gastroesophageal reflux disease) Protonix 40 mg p.o. twice daily.    Type 2 diabetes mellitus ([place]) She received Solu-Medrol 125 mg IVP x1 in the ED. Carbohydrate modified diet. Continue Lantus 26 units SQ every morning. Continue Byetta 10 mcg SQ twice daily with meals. CBG monitoring with regular insulin sliding scale.    Essential hypertension, benign Continue amlodipine 5 mg p.o. daily. Continue chlorthalidone 25 mg p.o. daily. Continue Cardura 2 mg p.o. daily. Continue Benicar 20 mg p.o. daily or formulary equivalent. Monitor blood pressure, renal function and electrolytes.    Grade 1 diastolic dysfunction Continue chlorthalidone and Benicar. No beta-blockers in the setting of asthma.    Hyperlipidemia Continue atorvastatin 40 mg p.o. at bedtime. Monitor LFTs as needed. Fasting lipid panel follow-up as an outpatient with PCP.    Insomnia Continue temazepam 15 mg p.o. at bedtime as needed    Normocytic anemia Anemia panel in January this year showed normal iron studies. Monitor hematocrit and hemoglobin.    Bulging lumbar disc Continue baclofen as needed.       DVT prophylaxis: Lovenox SQ. Code Status: Full code. Family Communication: Her spouse and daughter were present in the ED room. Disposition Plan: Admit for asthma exacerbation and community-acquired  pneumonia treatment for 2-3 days. Consults called:  Admission status: Inpatient/SDU.   Reubin Milan MD [facility] Pager [phone].  If 7PM-7AM, please contact night-coverage [email or web address] Password [facility]  11/17/2017, 10:43 PM

## 2017-11-17 NOTE — ED Notes (Signed)
Ambulated pt around nurses station on room air, o2 stayed around 92, dropped to 88 halfway to nurses station , heart rate went up to 150s , stopped walking process , pt put in wheelchair and back to room. Nurse put pt on 2 liters of oxygen.

## 2017-11-17 NOTE — ED Triage Notes (Addendum)
Reports of non-productive cough and shortness of breath when walking x4 days. Denies fevers. States she has been coughing for the last 4 hours constant. Reports of chest sore when coughing.   Currently being seen at [facility] for cough Dr. Lyda Kalata.

## 2017-11-18 ENCOUNTER — Encounter (HOSPITAL_COMMUNITY): Payer: Self-pay | Admitting: Internal Medicine

## 2017-11-18 DIAGNOSIS — M5126 Other intervertebral disc displacement, lumbar region: Secondary | ICD-10-CM | POA: Diagnosis present

## 2017-11-18 DIAGNOSIS — I519 Heart disease, unspecified: Secondary | ICD-10-CM

## 2017-11-18 DIAGNOSIS — M5136 Other intervertebral disc degeneration, lumbar region: Secondary | ICD-10-CM | POA: Diagnosis present

## 2017-11-18 LAB — BASIC METABOLIC PANEL
ANION GAP: 10 (ref 5–15)
BUN: 19 mg/dL (ref 6–20)
CALCIUM: 9.1 mg/dL (ref 8.9–10.3)
CHLORIDE: 94 mmol/L — AB (ref 101–111)
CO2: 25 mmol/L (ref 22–32)
Creatinine, Ser: 0.86 mg/dL (ref 0.44–1.00)
GFR calc non Af Amer: >60 mL/min (ref >60)
GLUCOSE: 251 mg/dL — AB (ref 65–99)
POTASSIUM: 4.5 mmol/L (ref 3.5–5.1)
SODIUM: 129 mmol/L — AB (ref 135–145)

## 2017-11-18 LAB — EXPECTORATED SPUTUM ASSESSMENT W REFEX TO RESP CULTURE

## 2017-11-18 LAB — MRSA PCR SCREENING: MRSA by PCR: NEGATIVE

## 2017-11-18 LAB — GLUCOSE, CAPILLARY
GLUCOSE-CAPILLARY: 216 mg/dL — AB (ref 65–99)
GLUCOSE-CAPILLARY: 100 mg/dL — AB (ref 65–99)
Glucose-Capillary: 237 mg/dL — ABNORMAL HIGH (ref 65–99)
Glucose-Capillary: 241 mg/dL — ABNORMAL HIGH (ref 65–99)
Glucose-Capillary: 161 mg/dL — ABNORMAL HIGH (ref 65–99)

## 2017-11-18 LAB — LACTIC ACID, PLASMA
LACTIC ACID, VENOUS: 1.8 mmol/L (ref 0.5–1.9)
LACTIC ACID, VENOUS: 2.1 mmol/L — AB (ref 0.5–1.9)

## 2017-11-18 LAB — STREP PNEUMONIAE URINARY ANTIGEN: Strep Pneumo Urinary Antigen: NEGATIVE

## 2017-11-18 LAB — EXPECTORATED SPUTUM ASSESSMENT W GRAM STAIN, RFLX TO RESP C

## 2017-11-18 MED ORDER — FUROSEMIDE 10 MG/ML IJ SOLN
60.0000 mg | Freq: Two times a day (BID) | INTRAMUSCULAR | Status: DC
  Administered 2017-11-18 – 2017-11-20 (×5): 60 mg via INTRAVENOUS
  Filled 2017-11-18 (×5): qty 6

## 2017-11-18 MED ORDER — LEVOFLOXACIN 250 MG PO TABS
250.0000 mg | ORAL_TABLET | Freq: Every day | ORAL | Status: DC
  Administered 2017-11-18 – 2017-11-20 (×3): 250 mg via ORAL
  Filled 2017-11-18 (×2): qty 1

## 2017-11-18 MED ORDER — METHYLPREDNISOLONE SODIUM SUCC 125 MG IJ SOLR
60.0000 mg | Freq: Two times a day (BID) | INTRAMUSCULAR | Status: DC
  Administered 2017-11-18 – 2017-11-19 (×2): 60 mg via INTRAVENOUS
  Filled 2017-11-18 (×2): qty 2

## 2017-11-18 MED ORDER — METHYLPREDNISOLONE SODIUM SUCC 125 MG IJ SOLR: 60.0000 mg | Freq: Four times a day (QID) | INTRAMUSCULAR | Status: DC

## 2017-11-18 MED ORDER — IRBESARTAN 75 MG PO TABS
75.0000 mg | ORAL_TABLET | Freq: Every day | ORAL | Status: DC
  Administered 2017-11-19 – 2017-11-21 (×3): 75 mg via ORAL
  Filled 2017-11-18 (×3): qty 1

## 2017-11-18 MED ORDER — IPRATROPIUM-ALBUTEROL 0.5-2.5 (3) MG/3ML IN SOLN
3.0000 mL | Freq: Four times a day (QID) | RESPIRATORY_TRACT | Status: DC
  Administered 2017-11-18 – 2017-11-21 (×12): 3 mL via RESPIRATORY_TRACT
  Filled 2017-11-18 (×12): qty 3

## 2017-11-18 MED ORDER — PHENOL 1.4 % MT LIQD
1.0000 | OROMUCOSAL | Status: DC | PRN
  Administered 2017-11-18: 1 via OROMUCOSAL
  Filled 2017-11-18 (×2): qty 177

## 2017-11-18 NOTE — Progress Notes (Addendum)
PROGRESS NOTE    CHISTINE Fuller  DDU:[id number] DOB: [birth year] DOA: 11/17/2017 PCP: Asencion Noble, MD  Brief Narrative: Ann Fuller is a 71 y.o. female with medical history significant of colon polyps, arthritis, adult onset asthma, history of breast cancer (1991, 1995), bulging lumbar disc, depression, diverticulosis, dyspepsia, GERD, hypertension, IBS, hemorrhoids, tubular adenoma, type 2 diabetes who is coming to the emergency department with progressively worse dyspnea associated with persistent nonproductive cough that occasionally induces nausea and wheezing for the past 5 days despite using her albuterol MDI and nebulizer solution at home.   Her chest radiograph showed  bilateral hazy infiltrates consistent with multifocal pneumonia versus asymmetric edema, she was started on Abx for pnuemonia and IVF Assessment & Plan:    1. Acute on chronic CHF due to valvular heart disease, has moderate aortic stenosis -Last echocardiogram with preserved ejection fraction and moderate aortic stenosis -She is profoundly volume overloaded with 2+ edema elevated JVD, fine crackles on exam -Clinically do not suspect pneumonia -BNP is not elevated however this could be falsely low in obese patients -stop IV fluids, start IV Lasix 40 mg every 12 -Strict I/O, daily weights -Assess her clinical response  2. ? Asthma exacerbation/CAP -as noted above suspect this is more pulmonary edema rather than pneumonia with asthma exacerbation -Cut down IV steroids -Change to by mouth low-dose Levaquin could treat for 3-5 days -Continue nebs when necessary  3. GERD (gastroesophageal reflux disease) -Protonix 40 mg p.o. twice daily.  4.  Type 2 diabetes mellitus (HCC) - Continue Lantus, Byetta  -SSI  5.  Essential hypertension, benign -Hold amlodipine and chlorthalidone  -Diuresis with IV Lasix  -Cut down Benicar dose   6.   Hyperlipidemia Continue atorvastatin   7. Normocytic anemia    -Anemia panel in January showed normal iron studies  -Monitor   8.   Bulging lumbar disc Continue baclofen as needed.  DVT prophylaxis: Lovenox SQ. Code Status: Full code. Family Communication:none at bedside Disposition Plan: home when adequately diuresed  Consultants:      Procedures:   Antimicrobials:    Subjective: -Denies any improvement in her breathing, reports that she may be a little worse  Objective: Vitals:   11/18/17 0600 11/18/17 0851 11/18/17 0901 11/18/17 1000  BP: (!) 145/83   (!) 167/81  Pulse: 97   99  Resp: 16   (!) 22  Temp:  98 F (36.7 C)    TempSrc:  Oral    SpO2: 95%  96% 100%  Weight:      Height:        Intake/Output Summary (Last 24 hours) at 11/18/2017 1402 Last data filed at 11/18/2017 0600 Gross per 24 hour  Intake 627.5 ml  Output -  Net 627.5 ml   Filed Weights   11/17/17 1648  Weight: 107 kg (236 lb)    Examination:  General exam: Obese middle-aged female, laying in bed, uncomfortable appearing Respiratory system: Fine basilar crackles Cardiovascular system: S1 & S2 heard, RRR. + JVD  Gastrointestinal system: Abdomen is nondistended, soft and nontender.Normal bowel sounds heard. Central nervous system: Alert and oriented. No focal neurological deficits. Extremities: 2+ edema Skin: No rashes, lesions or ulcers Psychiatry: Judgement and insight appear normal. Mood & affect appropriate.     Data Reviewed:   CBC: Recent Labs  Lab 11/17/17 1744  WBC 12.0*  NEUTROABS 9.6*  HGB 9.1*  HCT 29.0*  MCV 93.2  PLT 237   Basic Metabolic Panel: Recent Labs  Lab  11/17/17 1744 11/18/17 1052  NA 138 129*  K 4.3 4.5  CL 101 94*  CO2 28 25  GLUCOSE 111* 251*  BUN 19 19  CREATININE 0.89 0.86  CALCIUM 9.6 9.1  MG 1.7  --   PHOS 4.0  --    GFR: Estimated Creatinine Clearance: 67 mL/min (by C-G formula based on SCr of 0.86 mg/dL). Liver Function Tests: No results for input(s): AST, ALT, ALKPHOS, BILITOT, PROT,  ALBUMIN in the last 168 hours. No results for input(s): LIPASE, AMYLASE in the last 168 hours. No results for input(s): AMMONIA in the last 168 hours. Coagulation Profile: No results for input(s): INR, PROTIME in the last 168 hours. Cardiac Enzymes: Recent Labs  Lab 11/17/17 1744 11/17/17 2124  TROPONINI <0.03 <0.03   BNP (last 3 results) No results for input(s): PROBNP in the last 8760 hours. HbA1C: No results for input(s): HGBA1C in the last 72 hours. CBG: Recent Labs  Lab 11/17/17 2255 11/18/17 0734 11/18/17 1127  GLUCAP 237* 216* 241*   Lipid Profile: No results for input(s): CHOL, HDL, LDLCALC, TRIG, CHOLHDL, LDLDIRECT in the last 72 hours. Thyroid Function Tests: No results for input(s): TSH, T4TOTAL, FREET4, T3FREE, THYROIDAB in the last 72 hours. Anemia Panel: No results for input(s): VITAMINB12, FOLATE, FERRITIN, TIBC, IRON, RETICCTPCT in the last 72 hours. Urine analysis:    Component Value Date/Time   COLORURINE YELLOW 04/06/2015 1935   APPEARANCEUR CLEAR 04/06/2015 1935   LABSPEC 1.015 04/06/2015 1935   PHURINE 5.5 04/06/2015 1935   GLUCOSEU NEGATIVE 04/06/2015 1935   GLUCOSEU NEG mg/dL 11/21/2010 1308   HGBUR TRACE (A) 04/06/2015 1935   BILIRUBINUR NEGATIVE 04/06/2015 1935   KETONESUR NEGATIVE 04/06/2015 1935   PROTEINUR NEGATIVE 04/06/2015 1935   UROBILINOGEN 0.2 04/06/2015 1935   NITRITE NEGATIVE 04/06/2015 1935   LEUKOCYTESUR NEGATIVE 04/06/2015 1935   Sepsis Labs: @LABRCNTIP (procalcitonin:4,lacticidven:4)  ) Recent Results (from the past 240 hour(s))  MRSA PCR Screening     Status: None   Collection Time: 11/17/17 11:00 PM  Result Value Ref Range Status   MRSA by PCR NEGATIVE NEGATIVE Final    Comment:        The GeneXpert MRSA Assay (FDA approved for NASAL specimens only), is one component of a comprehensive MRSA colonization surveillance program. It is not intended to diagnose MRSA infection nor to guide or monitor treatment for MRSA  infections.   Culture, sputum-assessment     Status: None   Collection Time: 11/18/17  1:30 AM  Result Value Ref Range Status   Specimen Description SPUTUM  Final   Special Requests NONE  Final   Sputum evaluation THIS SPECIMEN IS ACCEPTABLE FOR SPUTUM CULTURE  Final   Report Status 11/18/2017 FINAL  Final         Radiology Studies: Dg Chest Port 1 View  Result Date: 11/17/2017 CLINICAL DATA:  Cough.  Shortness of breath. EXAM: PORTABLE CHEST 1 VIEW COMPARISON:  January 05, 2017 FINDINGS: No pneumothorax. The heart size is borderline. The hila and mediastinum are normal. Bilateral hazy pulmonary infiltrates, left greater than right, particularly in the bases. No nodules or masses. No other acute abnormalities. IMPRESSION: Bilateral hazy infiltrates may represent multifocal pneumonia. Asymmetric edema is possible as well. Recommend clinical correlation and follow-up to resolution. Electronically Signed   By: Dorise Bullion III M.D   On: 11/17/2017 19:32        Scheduled Meds: . aspirin EC  81 mg Oral Daily  . atorvastatin  40 mg Oral  QHS  . doxazosin  4 mg Oral Daily  . enoxaparin (LOVENOX) injection  40 mg Subcutaneous Q24H  . exenatide  10 mcg Subcutaneous BID WC  . furosemide  60 mg Intravenous Q12H  . insulin aspart  0-20 Units Subcutaneous TID WC  . insulin glargine  26 Units Subcutaneous q morning - 10a  . ipratropium-albuterol  3 mL Nebulization Q6H  . irbesartan  150 mg Oral Daily  . ketorolac  15 mg Intravenous Once  . magnesium oxide  400 mg Oral Daily  . methylPREDNISolone (SOLU-MEDROL) injection  60 mg Intravenous Q12H  . montelukast  10 mg Oral QHS  . pantoprazole  40 mg Oral BID  . promethazine  12.5 mg Intravenous Once  . sertraline  50 mg Oral Daily   Continuous Infusions: . levofloxacin (LEVAQUIN) IV       LOS: 1 day    Time spent: 73min    Domenic Polite, MD [facility] Page via [email or web address], password [email or web address] After 7PM please contact  night-coverage  11/18/2017, 2:02 PM

## 2017-11-18 NOTE — ACP (Advance Care Planning) (Signed)
Ann Fuller received Advance Directives information and will work to help her complete if she decides to do so.

## 2017-11-19 ENCOUNTER — Inpatient Hospital Stay (HOSPITAL_COMMUNITY): Payer: Medicare Other

## 2017-11-19 ENCOUNTER — Encounter (HOSPITAL_COMMUNITY): Payer: Self-pay | Admitting: Cardiology

## 2017-11-19 DIAGNOSIS — M5126 Other intervertebral disc displacement, lumbar region: Secondary | ICD-10-CM

## 2017-11-19 DIAGNOSIS — I350 Nonrheumatic aortic (valve) stenosis: Secondary | ICD-10-CM

## 2017-11-19 DIAGNOSIS — J189 Pneumonia, unspecified organism: Secondary | ICD-10-CM

## 2017-11-19 DIAGNOSIS — J9620 Acute and chronic respiratory failure, unspecified whether with hypoxia or hypercapnia: Secondary | ICD-10-CM

## 2017-11-19 DIAGNOSIS — I5033 Acute on chronic diastolic (congestive) heart failure: Secondary | ICD-10-CM

## 2017-11-19 DIAGNOSIS — D649 Anemia, unspecified: Secondary | ICD-10-CM

## 2017-11-19 LAB — BASIC METABOLIC PANEL
Anion gap: 10 (ref 5–15)
BUN: 31 mg/dL — AB (ref 6–20)
CHLORIDE: 92 mmol/L — AB (ref 101–111)
CO2: 29 mmol/L (ref 22–32)
CREATININE: 1.18 mg/dL — AB (ref 0.44–1.00)
Calcium: 9.1 mg/dL (ref 8.9–10.3)
GFR calc Af Amer: 53 mL/min — ABNORMAL LOW (ref >60)
GFR, EST NON AFRICAN AMERICAN: 46 mL/min — AB (ref >60)
Glucose, Bld: 237 mg/dL — ABNORMAL HIGH (ref 65–99)
Potassium: 4.2 mmol/L (ref 3.5–5.1)
Sodium: 131 mmol/L — ABNORMAL LOW (ref 135–145)

## 2017-11-19 LAB — GLUCOSE, CAPILLARY
GLUCOSE-CAPILLARY: 253 mg/dL — AB (ref 65–99)
GLUCOSE-CAPILLARY: 128 mg/dL — AB (ref 65–99)
GLUCOSE-CAPILLARY: 185 mg/dL — AB (ref 65–99)
Glucose-Capillary: 106 mg/dL — ABNORMAL HIGH (ref 65–99)

## 2017-11-19 LAB — BLOOD GAS, ARTERIAL
Acid-Base Excess: 6.8 mmol/L — ABNORMAL HIGH (ref 0.0–2.0)
Bicarbonate: 30.2 mmol/L — ABNORMAL HIGH (ref 20.0–28.0)
Drawn by: 23534
O2 Content: 2.5 L/min
O2 Saturation: 97.2 %
pCO2 arterial: 50 mmHg — ABNORMAL HIGH (ref 32.0–48.0)
pH, Arterial: 7.414 (ref 7.350–7.450)
pO2, Arterial: 98.4 mmHg (ref 83.0–108.0)

## 2017-11-19 LAB — PROCALCITONIN: Procalcitonin: <0.1 ng/mL

## 2017-11-19 MED ORDER — CARVEDILOL 3.125 MG PO TABS
3.1250 mg | ORAL_TABLET | Freq: Two times a day (BID) | ORAL | Status: DC
  Administered 2017-11-19 – 2017-11-21 (×5): 3.125 mg via ORAL
  Filled 2017-11-19 (×5): qty 1

## 2017-11-19 MED ORDER — METHYLPREDNISOLONE SODIUM SUCC 40 MG IJ SOLR
40.0000 mg | Freq: Two times a day (BID) | INTRAMUSCULAR | Status: DC
  Administered 2017-11-19 – 2017-11-21 (×6): 40 mg via INTRAVENOUS
  Filled 2017-11-19 (×5): qty 1

## 2017-11-19 MED ORDER — LORAZEPAM 1 MG PO TABS
1.0000 mg | ORAL_TABLET | Freq: Two times a day (BID) | ORAL | Status: DC | PRN
  Administered 2017-11-20 – 2017-11-21 (×3): 1 mg via ORAL
  Filled 2017-11-19 (×3): qty 1

## 2017-11-19 NOTE — Progress Notes (Signed)
Inpatient Diabetes Program Recommendations  AACE/ADA: New Consensus Statement on Inpatient Glycemic Control (2015)  Target Ranges:  Prepandial:   less than 140 mg/dL      Peak postprandial:   less than 180 mg/dL (1-2 hours)      Critically ill patients:  140 - 180 mg/dL  Results for JESSEY, HUYETT (MRN [id number]) as of 11/19/2017 09:44  Ref. Range 11/18/2017 07:34 11/18/2017 11:27 11/18/2017 16:15 11/18/2017 21:15 11/19/2017 07:48  Glucose-Capillary Latest Ref Range: 65 - 99 mg/dL 216 (H) 241 (H) 161 (H) 100 (H) 253 (H)    Review of Glycemic Control  Diabetes history: DM2 Outpatient Diabetes medications: Byetta 10 mcg BID, Lantus 26 units QPM, Humalog 6 units BID Current orders for Inpatient glycemic control: Lantus 26 units QAM, Byetta 10 mcg BID, Novolog 0-20 TID with meals; Solumedrol 40 Q12H  Inpatient Diabetes Program Recommendations: Insulin - Meal Coverage: If steroids are continued, please consider ordering Novolog 3 units TID with meals for meal coverage if patient eats at least 50% of meals.  Thanks, Barnie Alderman, RN, MSN, CDE Diabetes Coordinator Inpatient Diabetes Program [phone] (Team Pager from 8am to 5pm)

## 2017-11-19 NOTE — Plan of Care (Signed)
  Acute Rehab PT Goals(only PT should resolve) Pt Will Go Supine/Side To Sit 11/19/2017 1608 - Progressing by Lonell Grandchild, PT Flowsheets Taken 11/19/2017 1608  Pt will go Supine/Side to Sit Independently Patient Will Transfer Sit To/From Stand 11/19/2017 1608 - Progressing by Lonell Grandchild, PT Flowsheets Taken 11/19/2017 1608  Patient will transfer sit to/from stand with modified independence Pt Will Transfer Bed To Chair/Chair To Bed 11/19/2017 1608 - Progressing by Lonell Grandchild, PT Flowsheets Taken 11/19/2017 1608  Pt will Transfer Bed to Chair/Chair to Bed with modified independence Pt Will Ambulate 11/19/2017 1608 - Progressing by Lonell Grandchild, PT Flowsheets Taken 11/19/2017 1608  Pt will Ambulate with modified independence;100 feet;with rolling walker  4:09 PM, 11/19/17 Lonell Grandchild, MPT Physical Therapist with [facility] [phone] office [phone] mobile phone

## 2017-11-19 NOTE — Care Management Note (Signed)
Case Management Note  Patient Details  Name: Ann Fuller MRN: [id number] Date of Birth: 1946-01-23  Subjective/Objective:    Adm with ? CAP , CHF From home with husband. Ind with ADL's, does have a cane at home if needed, uses sometimes when she has back pain. No Home health pta. She has asthma, uses inhalers and neb machine at home. Acutely on oxygen. She has PCP and insurance, reports no issues affording medications.               Action/Plan:Anticpate DC home. CM following for needs.    Expected Discharge Date:    unk at this time.               Expected Discharge Plan:     In-House Referral:     Discharge planning Services  CM Consult  Post Acute Care Choice:    Choice offered to:     DME Arranged:    DME Agency:     HH Arranged:    HH Agency:     Status of Service:  In process, will continue to follow  If discussed at Long Length of Stay Meetings, dates discussed:    Additional Comments:  Saleh Ulbrich, Chauncey Reading, RN 11/19/2017, 2:00 PM

## 2017-11-19 NOTE — Progress Notes (Signed)
PROGRESS NOTE    Ann Fuller  XNA:[id number] DOB: [birth year] DOA: 11/17/2017 PCP: Asencion Noble, MD  Brief Narrative: Ann Fuller is a 71 y.o. female with medical history significant of colon polyps, arthritis, adult onset asthma, history of breast cancer (1991, 1995), bulging lumbar disc, depression, diverticulosis, dyspepsia, GERD, hypertension, IBS, hemorrhoids, tubular adenoma, type 2 diabetes and morbid obesity. Patient was admitted with progressive and worsening dyspnea, associated with persistent nonproductive cough that occasionally induces nausea and wheezing. Symptoms got worse about 5 days prior to admission despite using her albuterol MDI and nebulizer solution at home. On further questioning, patient reported chronic dyspnea with minimal activity, to the extent that patient is no longer able to cook at home or go shopping. Patient has orthopnea and leg edema. Denies snoring, but has never been worked up for sleep apnea/OHS. Patient denied prior use of cigarette use. No fever.   Patient is known to a Cardiologist based out of [facility], and tells me that she had ECHO done about a month ago for Aortic Stenosis follow up. Patient does not recall being informed that she has CHF. I gather that the Aortic Stenosis work up and management is a bit conservative based on history of recurrent breast cancer, chemotherapy and radiotherapy exposure previously. Details of the breast cancer status and treatment are scanty for now.  Patient's chest radiograph showed  bilateral hazy infiltrates consistent with multifocal pneumonia versus asymmetric edema. Patient was started on Abx for possible pnuemonia and IVF Assessment & Plan:    1. Acute on chronic CHF due to valvular heart disease, has moderate aortic stenosis -Last echocardiogram visualized in our system was in 2015. However, patient had ECHO done last month at [facility] according to the patient. Cardiology  consulted. -Due to patient's body habitus, it is difficult to correctly estimate volume, but patient definitely looks a bit overloaded with mild edema up to above the ankle. -Clinically do not suspect pneumonia -Continue IV lasix -Strict I/O, daily weights -Cardiology consulted.  2. Possible Community Acquired Pneumonia. - No reported fever - Will repeat CBC. Check pro-calcitonin and proceed with CT Chest without chest. - CXR reviewed personally by me. - Currently on oral antibiotics. - Chronic SOB and Dyspnea will be suggestive of other underlying pathology - Patient has cardiac history, breast cancer history, and has been exposed to chemotherapy and radiotherapy. - Low threshold to consult Pulmonary team as well.  3. Acute on chronic respiratory failure.  - See above.  - Check ABG.  - Low threshold to consult pulmonary - Follow CT Chest result - Continue Supplemental oxygen - Patient is known to a Pulmonologist on Outpatient basis.  4. History of Asthma - No wheezing - Peak flow  5. GERD (gastroesophageal reflux disease) -Protonix 40 mg p.o. twice daily.  6.  Type 2 diabetes mellitus (HCC) - Continue Lantus, Byetta  -SSI  7.  Essential hypertension, benign -Optimize - Add Coreg  -Diuresis with IV Lasix. Monitor renal function and electrolytes closely as Scr is rising. -On Avapro  8.   Hyperlipidemia Continue atorvastatin   9. Normocytic anemia  -Chronic  -Monitor   10.   Bulging lumbar disc Continue baclofen as needed. Consult Physical Therapy  DVT prophylaxis: Lovenox SQ. Code Status: Full code. Family Communication:none at bedside Disposition Plan: home when adequately diuresed  Consultants:      Procedures:   Antimicrobials:    Subjective: -Denies any improvement in her breathing, reports that she may be a little worse  Objective: Vitals:   11/19/17 0134 11/19/17 0400 11/19/17 0500 11/19/17 0745  BP:      Pulse:      Resp:      Temp:   98 F (36.7 C)    TempSrc:  Oral    SpO2: 97%   96%  Weight:   109 kg (240 lb 4.8 oz)   Height:        Intake/Output Summary (Last 24 hours) at 11/19/2017 0920 Last data filed at 11/19/2017 0100 Gross per 24 hour  Intake -  Output 3000 ml  Net -3000 ml   Filed Weights   11/17/17 1648 11/19/17 0500  Weight: 107 kg (236 lb) 109 kg (240 lb 4.8 oz)    Examination:  General exam: Obese middle-aged female, laying in bed, not in any distress Respiratory system: Decreased air entry. Faint crackles posterior bases. Cardiovascular system: S1 & S2 heard, RRR. + JVD  Gastrointestinal system: Abdomen is obese, non tender. Organs are difficult to assess. Central nervous system: Alert and oriented. No focal neurological deficits. Extremities: Mild lower leg edema, above the ankle Skin: No rashes, lesions or ulcers Psychiatry: Judgement and insight appear normal. Mood & affect appropriate.     Data Reviewed:   CBC: Recent Labs  Lab 11/17/17 1744  WBC 12.0*  NEUTROABS 9.6*  HGB 9.1*  HCT 29.0*  MCV 93.2  PLT 767   Basic Metabolic Panel: Recent Labs  Lab 11/17/17 1744 11/18/17 1052 11/19/17 0549  NA 138 129* 131*  K 4.3 4.5 4.2  CL 101 94* 92*  CO2 28 25 29   GLUCOSE 111* 251* 237*  BUN 19 19 31*  CREATININE 0.89 0.86 1.18*  CALCIUM 9.6 9.1 9.1  MG 1.7  --   --   PHOS 4.0  --   --    GFR: Estimated Creatinine Clearance: 49.4 mL/min (A) (by C-G formula based on SCr of 1.18 mg/dL (H)). Liver Function Tests: No results for input(s): AST, ALT, ALKPHOS, BILITOT, PROT, ALBUMIN in the last 168 hours. No results for input(s): LIPASE, AMYLASE in the last 168 hours. No results for input(s): AMMONIA in the last 168 hours. Coagulation Profile: No results for input(s): INR, PROTIME in the last 168 hours. Cardiac Enzymes: Recent Labs  Lab 11/17/17 1744 11/17/17 2124  TROPONINI <0.03 <0.03   BNP (last 3 results) No results for input(s): PROBNP in the last 8760  hours. HbA1C: No results for input(s): HGBA1C in the last 72 hours. CBG: Recent Labs  Lab 11/18/17 0734 11/18/17 1127 11/18/17 1615 11/18/17 2115 11/19/17 0748  GLUCAP 216* 241* 161* 100* 253*   Lipid Profile: No results for input(s): CHOL, HDL, LDLCALC, TRIG, CHOLHDL, LDLDIRECT in the last 72 hours. Thyroid Function Tests: No results for input(s): TSH, T4TOTAL, FREET4, T3FREE, THYROIDAB in the last 72 hours. Anemia Panel: No results for input(s): VITAMINB12, FOLATE, FERRITIN, TIBC, IRON, RETICCTPCT in the last 72 hours. Urine analysis:    Component Value Date/Time   COLORURINE YELLOW 04/06/2015 1935   APPEARANCEUR CLEAR 04/06/2015 1935   LABSPEC 1.015 04/06/2015 1935   PHURINE 5.5 04/06/2015 1935   GLUCOSEU NEGATIVE 04/06/2015 1935   GLUCOSEU NEG mg/dL 11/21/2010 1308   HGBUR TRACE (A) 04/06/2015 1935   BILIRUBINUR NEGATIVE 04/06/2015 1935   KETONESUR NEGATIVE 04/06/2015 1935   PROTEINUR NEGATIVE 04/06/2015 1935   UROBILINOGEN 0.2 04/06/2015 1935   NITRITE NEGATIVE 04/06/2015 1935   LEUKOCYTESUR NEGATIVE 04/06/2015 1935   Sepsis Labs: @LABRCNTIP (procalcitonin:4,lacticidven:4)  ) Recent Results (from the past 240  hour(s))  MRSA PCR Screening     Status: None   Collection Time: 11/17/17 11:00 PM  Result Value Ref Range Status   MRSA by PCR NEGATIVE NEGATIVE Final    Comment:        The GeneXpert MRSA Assay (FDA approved for NASAL specimens only), is one component of a comprehensive MRSA colonization surveillance program. It is not intended to diagnose MRSA infection nor to guide or monitor treatment for MRSA infections.   Culture, sputum-assessment     Status: None   Collection Time: 11/18/17  1:30 AM  Result Value Ref Range Status   Specimen Description SPUTUM  Final   Special Requests NONE  Final   Sputum evaluation THIS SPECIMEN IS ACCEPTABLE FOR SPUTUM CULTURE  Final   Report Status 11/18/2017 FINAL  Final  Culture, respiratory (NON-Expectorated)      Status: None (Preliminary result)   Collection Time: 11/18/17  1:30 AM  Result Value Ref Range Status   Specimen Description SPUTUM  Final   Special Requests NONE Reflexed from [id number]  Final   Gram Stain   Final    RARE WBC PRESENT, PREDOMINANTLY MONONUCLEAR FEW SQUAMOUS EPITHELIAL CELLS PRESENT MODERATE GRAM POSITIVE COCCI IN PAIRS FEW GRAM POSITIVE RODS Performed at [facility], [employer] [address], [place], [place] [address]    Culture PENDING  Incomplete   Report Status PENDING  Incomplete         Radiology Studies: Dg Chest Port 1 View  Result Date: 11/17/2017 CLINICAL DATA:  Cough.  Shortness of breath. EXAM: PORTABLE CHEST 1 VIEW COMPARISON:  January 05, 2017 FINDINGS: No pneumothorax. The heart size is borderline. The hila and mediastinum are normal. Bilateral hazy pulmonary infiltrates, left greater than right, particularly in the bases. No nodules or masses. No other acute abnormalities. IMPRESSION: Bilateral hazy infiltrates may represent multifocal pneumonia. Asymmetric edema is possible as well. Recommend clinical correlation and follow-up to resolution. Electronically Signed   By: Dorise Bullion III M.D   On: 11/17/2017 19:32        Scheduled Meds: . aspirin EC  81 mg Oral Daily  . atorvastatin  40 mg Oral QHS  . doxazosin  4 mg Oral Daily  . enoxaparin (LOVENOX) injection  40 mg Subcutaneous Q24H  . exenatide  10 mcg Subcutaneous BID WC  . furosemide  60 mg Intravenous Q12H  . insulin aspart  0-20 Units Subcutaneous TID WC  . insulin glargine  26 Units Subcutaneous q morning - 10a  . ipratropium-albuterol  3 mL Nebulization Q6H  . irbesartan  75 mg Oral Daily  . levofloxacin  250 mg Oral Daily  . magnesium oxide  400 mg Oral Daily  . methylPREDNISolone (SOLU-MEDROL) injection  40 mg Intravenous Q12H  . montelukast  10 mg Oral QHS  . pantoprazole  40 mg Oral BID  . sertraline  50 mg Oral Daily   Continuous Infusions:    LOS: 2 days    Time  spent: 65min    Domenic Polite, MD [facility] Page via [email or web address], password [email or web address] After 7PM please contact night-coverage  11/19/2017, 9:20 AM

## 2017-11-19 NOTE — Evaluation (Signed)
Physical Therapy Evaluation Patient Details Name: Ann Fuller MRN: [id number] DOB: 23-Feb-1946 Today's Date: 11/19/2017   History of Present Illness  Ann Fuller is a 71 y.o. female with medical history significant of colon polyps, arthritis, adult onset asthma, history of breast cancer (1991, 1995), bulging lumbar disc, depression, diverticulosis, dyspepsia, GERD, hypertension, IBS, hemorrhoids, tubular adenoma, type 2 diabetes who is coming to the emergency department with progressively worse dyspnea associated with persistent nonproductive cough that occasionally induces nausea and wheezing for the past 5 days despite using her albuterol MDI and nebulizer solution at home.  She mentions that the the frequent coughing has given her pain in her lower ribs and she feels very fatigued.  She denies fever, chills, night sweats, rhinorrhea, hemoptysis, diaphoresis, dizziness, PND, orthopnea, abdominal pain, constipation, melena or hematochezia.  She denies dysuria, frequency or hematuria.  Denies sick contacts to her knowledge, travel history, environmental allergens, tobacco smoke or industrial fumes exposure.    Clinical Impression  Patient limited for functional mobility as stated below secondary to BLE weakness, fatigue and fair/poor standing balance.  Patient will benefit from continued physical therapy in hospital and recommended venue below to increase strength, balance, endurance for safe ADLs and gait.    Follow Up Recommendations Home health PT;Supervision - Intermittent    Equipment Recommendations  None recommended by PT    Recommendations for Other Services       Precautions / Restrictions Precautions Precautions: Fall Precaution Comments: monitor O2 sats Restrictions Weight Bearing Restrictions: No      Mobility  Bed Mobility Overal bed mobility: Needs Assistance Bed Mobility: Supine to Sit;Sit to Supine     Supine to sit: Supervision Sit to supine:  Supervision   General bed mobility comments: with head of bed raised  Transfers Overall transfer level: Needs assistance Equipment used: None Transfers: Sit to/from [facility] Sit to Stand: Min guard Stand pivot transfers: Min guard       General transfer comment: unsteady on feet with near loss of balance  Ambulation/Gait Ambulation/Gait assistance: Min guard Ambulation Distance (Feet): 45 Feet Assistive device: Rolling walker (2 wheeled) Gait Pattern/deviations: Decreased step length - right;Decreased step length - left;Decreased stride length   Gait velocity interpretation: Below normal speed for age/gender General Gait Details: slightly labored slow cadence without loss of balance, limited secondary to fatigue  Stairs            Wheelchair Mobility    Modified Rankin (Stroke Patients Only)       Balance Overall balance assessment: Needs assistance Sitting-balance support: No upper extremity supported;Feet supported Sitting balance-Leahy Scale: Good     Standing balance support: No upper extremity supported;During functional activity Standing balance-Leahy Scale: Fair Standing balance comment: fair/good using RW                             Pertinent Vitals/Pain Pain Assessment: 0-10 Pain Score: 5  Pain Location: left flank Pain Descriptors / Indicators: Aching;Sore Pain Intervention(s): Limited activity within patient's tolerance;Monitored during session    [place] expects to be discharged to:: Private residence Living Arrangements: Spouse/significant other Available Help at Discharge: Family Type of Home: House Home Access: Level entry     Home Layout: One [place]: Environmental consultant - 2 wheels;Cane - single point;Bedside commode      Prior Function Level of Independence: Independent  Hand Dominance        Extremity/Trunk Assessment   Upper Extremity Assessment Upper  Extremity Assessment: Generalized weakness    Lower Extremity Assessment Lower Extremity Assessment: Generalized weakness    Cervical / Trunk Assessment Cervical / Trunk Assessment: Normal  Communication   Communication: No difficulties  Cognition Arousal/Alertness: Awake/alert Behavior During Therapy: WFL for tasks assessed/performed Overall Cognitive Status: Within Functional Limits for tasks assessed                                        General Comments      Exercises     Assessment/Plan    PT Assessment Patient needs continued PT services  PT Problem List Decreased strength;Decreased activity tolerance;Decreased balance;Decreased mobility       PT Treatment Interventions Gait training;Functional mobility training;Therapeutic activities;Therapeutic exercise;Patient/family education    PT Goals (Current goals can be found in the Care Plan section)  Acute Rehab PT Goals Patient Stated Goal: return home Time For Goal Achievement: 11/19/17 Potential to Achieve Goals: Good    Frequency Min 3X/week   Barriers to discharge        Co-evaluation               AM-PAC PT "6 Clicks" Daily Activity  Outcome Measure Difficulty turning over in bed (including adjusting bedclothes, sheets and blankets)?: None Difficulty moving from lying on back to sitting on the side of the bed? : None Difficulty sitting down on and standing up from a chair with arms (e.g., wheelchair, bedside commode, etc,.)?: A Little Help needed moving to and from a bed to chair (including a wheelchair)?: A Little Help needed walking in hospital room?: A Little Help needed climbing 3-5 steps with a railing? : A Lot 6 Click Score: 19    End of Session Equipment Utilized During Treatment: Gait belt;Oxygen Activity Tolerance: Patient tolerated treatment well;Patient limited by fatigue Patient left: in chair;with call bell/phone within reach(RN notified patient left up in  chair) Nurse Communication: Mobility status PT Visit Diagnosis: Unsteadiness on feet (R26.81);Other abnormalities of gait and mobility (R26.89);Muscle weakness (generalized) (M62.81)    Time: 2876-8115 PT Time Calculation (min) (ACUTE ONLY): 29 min   Charges:   PT Evaluation $PT Eval Moderate Complexity: 1 Mod PT Treatments $Therapeutic Activity: 23-37 mins   PT G Codes:   PT G-Codes **NOT FOR INPATIENT CLASS** Functional Assessment Tool Used: AM-PAC 6 Clicks Basic Mobility Functional Limitation: Mobility: Walking and moving around Mobility: Walking and Moving Around Current Status (B2620): At least 20 percent but less than 40 percent impaired, limited or restricted Mobility: Walking and Moving Around Goal Status [phone]): At least 20 percent but less than 40 percent impaired, limited or restricted Mobility: Walking and Moving Around Discharge Status [phone]): At least 20 percent but less than 40 percent impaired, limited or restricted    4:07 PM, 11/19/17 Lonell Grandchild, MPT Physical Therapist with [facility] 336 [phone] office [phone] mobile phone

## 2017-11-19 NOTE — Consult Note (Signed)
CARDIOLOGY CONSULT NOTE    Patient ID: Ann Fuller; [id number]; June 26, 1946   Admit date: 11/17/2017 Date of Consult: 11/19/2017  Primary Care Provider: Asencion Noble, MD Primary Cardiologist:: Howell Rucks, MD [facility])   Patient Profile:   Ann Fuller is a 71 y.o. female with a hx of moderate aortic valve stenosis, chronic dyspnea, asthma, type 2 diabetes, chronic diastolic heart failure, breast cancer, arthritis, multiple medical problems to include morbid obesity, IBS, and GERD who is being seen today for the evaluation of worsening dyspnea, orthopnea, PND at the request of Karleen Hampshire, MD.   History of Present Illness:   Ann Fuller who is normally followed at [facility] by primary cardiologist Dr. Doneta Public, seen most recently on 10/23/2017.  According to their notes located in [place], the patient did have a dobutamine stress echo in May 2018 which was negative for inducible ischemia.  She was found to have moderate aortic valve stenosis.  Her dyspnea was thought to be more representative of deconditioning than due to aortic valve stenosis.  She has also been seen by a neurologist, Dr. Olene Craven for on 11/06/2017 concerning ongoing dyspnea.  He was started on a new medication benralizaumab, which is immunomodulator therapy.  However, the patient did not fill the prescription as she was waiting on insurance approval.  Shortly after leaving Dr. Newman Nickels office, she began to have worsening coughing and congestion.  She stated that she had PND and orthopnea.  Her left lower chest had become very sore.  The patient presented to the emergency room with progressively worsening dyspnea, nonproductive coughing, wheezing, and some nausea and vomiting after forceful coughing.  She used home nebulizer treatments which did not improve her symptoms.  She became very fatigued and having soreness in her ribs from coughing and therefore presented to the emergency  room.  On arrival to the emergency room, the patient's blood pressure was 158/49, heart rate 107, O2 sat 93%, she was afebrile.  EKG revealed sinus tachycardia, heart rate of 103 bpm, with occasional PAC.  No acute ST-T wave changes were noted.  Pertinent labs, glucose 111.  Creatinine 0.89.  She was found to be anemic with a hemoglobin of 9.1 hematocrit of 29.0, white blood cells elevated at 12.0.  She was not thrombocytopenic.  Troponin was negative x2.  Magnesium 1.7.  BN P 55.  Chest x-ray revealing hazy infiltrates, multifocal pneumonia, possibly asymmetric edema.  She was treated with IV Solu-Medrol, albuterol nebulizer, Atrovent, started on levofloxacin, and admitted to ICU for further treatment.  Past Medical History:  Diagnosis Date  . Adenomatous colon polyp   . Arthritis   . Asthma   . Breast cancer ([place]) [id number]  . Bulging lumbar disc   . Depression   . Diverticulitis   . Dyspepsia   . Essential hypertension, benign   . GERD (gastroesophageal reflux disease)   . Heart murmur   . Hyperlipidemia   . IBS (irritable bowel syndrome)   . Internal hemorrhoid   . Right shoulder pain   . Tubular adenoma 11/2010  . Type 2 diabetes mellitus ([place])     Past Surgical History:  Procedure Laterality Date  . ABDOMINAL HYSTERECTOMY    . APPENDECTOMY    . BACK SURGERY  2007   Lumbar fusion  . CATARACT EXTRACTION W/PHACO Left 08/27/2013   Procedure: CATARACT EXTRACTION PHACO AND INTRAOCULAR LENS PLACEMENT (IOC);  Surgeon: Tonny Branch, MD;  Location: [facility];  Service: Ophthalmology;  Laterality: Left;  CDE 4.25  . CATARACT EXTRACTION W/PHACO Right 09/24/2013   Procedure: CATARACT EXTRACTION PHACO AND INTRAOCULAR LENS PLACEMENT (IOC);  Surgeon: Tonny Branch, MD;  Location: [facility];  Service: Ophthalmology;  Laterality: Right;  CDE:  8.16  . CHOLECYSTECTOMY    . COLONOSCOPY  12/13/2010   Dr. Margarito Courser pancolonic diverticular.tubular adenoma  . COLONOSCOPY  07/13/2003  . COLONOSCOPY  N/A 10/28/2015   [id number] diverticulosis, multiple tubular adenomas removed. next tcs 10/2020.   . ESOPHAGOGASTRODUODENOSCOPY  12/13/2010   Dr. Jennet Maduro hernia, fundal gland type polyps  . EYE SURGERY Left    Left KPE 08/27/13  . MASTECTOMY  1995  . NASAL ENDOSCOPY WITH EPISTAXIS CONTROL Left 07/16/2016   Procedure: ENDOSCOPIC LEFT NASAL CAUTERY;  Surgeon: Leta Baptist, MD;  Location: [place];  Service: ENT;  Laterality: Left;  . TONSILLECTOMY    . TUBAL LIGATION       Home Medications:  Prior to Admission medications   Medication Sig Start Date End Date Taking? Authorizing Provider  albuterol (PROVENTIL HFA;VENTOLIN HFA) 108 (90 Base) MCG/ACT inhaler Inhale 2 puffs into the lungs every 4 (four) hours as needed for wheezing or shortness of breath.   Yes [provider]  amLODipine (NORVASC) 5 MG tablet Take 5 mg by mouth daily.   Yes [provider]  aspirin EC 81 MG tablet Take 81 mg by mouth daily.   Yes [provider]  atorvastatin (LIPITOR) 40 MG tablet Take 40 mg by mouth at bedtime.    Yes [provider]  baclofen (LIORESAL) 10 MG tablet Take 5 mg by mouth daily as needed for muscle spasms.    Yes [provider]  benzonatate (TESSALON) 200 MG capsule TAKE ONE CAPSULE BY MOUTH EVERY 6 HOURS FOR COUGH Patient taking differently: TAKE ONE CAPSULE BY MOUTH EVERY 6 HOURS FOR COUGH. Takes as needed 08/28/16  Yes Tanda Rockers, MD  budesonide-formoterol (SYMBICORT) 80-4.5 MCG/ACT inhaler Inhale 2 puffs into the lungs 2 (two) times daily. 08/31/16  Yes Tanda Rockers, MD  chlorpheniramine (CHLOR-TRIMETON) 4 MG tablet Take 4 mg by mouth every 4 (four) hours as needed (drippy nose, drainage, throat clearing).    Yes [provider]  chlorthalidone (HYGROTON) 25 MG tablet Take 25 mg by mouth every morning.    Yes [provider]  dicyclomine (BENTYL) 10 MG capsule TAKE ONE CAPSULE BY MOUTH PRIOR TO MEALSAS NEEDED.  NO MORE THAN FOUR TIMES DAILY. MAY CAUSE DROWINESS. 04/28/17  Yes Mahala Menghini, PA-C  doxazosin (CARDURA) 2 MG tablet Take 4 mg by mouth every morning.    Yes [provider]  exenatide (BYETTA 10 MCG PEN) 10 MCG/0.04ML SOLN Inject 10 mcg into the skin 2 (two) times daily with a meal.    Yes [provider]  Insulin Glargine (LANTUS) 100 UNIT/ML Solostar Pen Inject 26 Units into the skin every morning.    Yes [provider]  Insulin Lispro (HUMALOG KWIKPEN [place]) Inject 6 Units into the skin 2 (two) times daily.    Yes [provider]  ipratropium-albuterol (DUONEB) 0.5-2.5 (3) MG/3ML SOLN Take 3 mLs by nebulization every 4 (four) hours. Patient taking differently: Take 3 mLs by nebulization every 6 (six) hours as needed (wheezing and shortness of breath).  04/10/15  Yes Asencion Noble, MD  LORazepam (ATIVAN) 1 MG tablet Take 1 mg by mouth 2 (two) times daily as needed. Anxiety/Sleep   Yes [provider]  magnesium oxide (MAG-OX)  400 MG tablet Take 400 mg by mouth daily.   Yes [provider]  montelukast (SINGULAIR) 10 MG tablet TAKE ONE TABLET BY MOUTH EVERY NIGHT AT BEDTIME 11/20/16  Yes Tanda Rockers, MD  olmesartan (BENICAR) 40 MG tablet Take 40 mg by mouth daily.    Yes [provider]  pantoprazole (PROTONIX) 40 MG tablet TAKE ONE TABLET TWICE DAILY BEFORE A MEAL 04/28/17  Yes Mahala Menghini, PA-C  sertraline (ZOLOFT) 50 MG tablet Take 50 mg by mouth.   Yes [provider]  Spacer/Aero-Holding Chambers (AEROCHAMBER MV) inhaler Use as instructed 08/31/16  Yes Tanda Rockers, MD  temazepam (RESTORIL) 15 MG capsule Take 15 mg by mouth at bedtime as needed for sleep.   Yes [provider]  vitamin C (ASCORBIC ACID) 500 MG tablet Take 500 mg by mouth daily as needed.    Yes [provider]    Inpatient Medications: Scheduled Meds: . aspirin EC  81 mg Oral Daily  . atorvastatin  40 mg Oral QHS  . carvedilol   3.125 mg Oral BID WC  . doxazosin  4 mg Oral Daily  . enoxaparin (LOVENOX) injection  40 mg Subcutaneous Q24H  . exenatide  10 mcg Subcutaneous BID WC  . furosemide  60 mg Intravenous Q12H  . insulin aspart  0-20 Units Subcutaneous TID WC  . insulin glargine  26 Units Subcutaneous q morning - 10a  . ipratropium-albuterol  3 mL Nebulization Q6H  . irbesartan  75 mg Oral Daily  . levofloxacin  250 mg Oral Daily  . magnesium oxide  400 mg Oral Daily  . methylPREDNISolone (SOLU-MEDROL) injection  40 mg Intravenous Q12H  . montelukast  10 mg Oral QHS  . pantoprazole  40 mg Oral BID  . sertraline  50 mg Oral Daily    PRN Meds: albuterol, baclofen, benzonatate, dicyclomine, diphenhydrAMINE, LORazepam, phenol, temazepam  Allergies:    Allergies  Allergen Reactions  . Ceftin [Cefuroxime Axetil] Swelling  . Codeine Swelling    Tongue swells  . Metformin And Related Diarrhea  . Nexium [Esomeprazole Magnesium] Diarrhea  . Omnicef [Cefdinir] Diarrhea  . Sulfa Antibiotics Rash    Social History:   Social History   Socioeconomic History  . Marital status: Married    Spouse name: Not on file  . Number of children: Not on file  . Years of education: Not on file  . Highest education level: Not on file  Social Needs  . Financial resource strain: Not on file  . Food insecurity - worry: Not on file  . Food insecurity - inability: Not on file  . Transportation needs - medical: Not on file  . Transportation needs - non-medical: Not on file  Occupational History  . Not on file  Tobacco Use  . Smoking status: Never Smoker  . Smokeless tobacco: Never Used  Substance and Sexual Activity  . Alcohol use: No  . Drug use: No  . Sexual activity: Yes    Birth control/protection: Surgical  Other Topics Concern  . Not on file  Social History Narrative  . Not on file    Family History:    Family History  Problem Relation Age of Onset  . Colon cancer Mother   . Heart attack Father   .  Heart attack Sister   . Stroke Sister   . Cancer - Lung Sister      ROS:  Please see the history of present illness.  ROS  All  other ROS reviewed and negative.     Physical Exam/Data:   Vitals:   11/19/17 0500 11/19/17 0700 11/19/17 0745 11/19/17 0800  BP:  (!) 153/86  (!) 162/73  Pulse:  93  (!) 103  Resp:  (!) 21  (!) 36  Temp:      TempSrc:      SpO2:  96% 96% 95%  Weight: 240 lb 4.8 oz (109 kg)     Height:        Intake/Output Summary (Last 24 hours) at 11/19/2017 1022 Last data filed at 11/19/2017 0100 Gross per 24 hour  Intake -  Output 3000 ml  Net -3000 ml   Filed Weights   11/17/17 1648 11/19/17 0500  Weight: 236 lb (107 kg) 240 lb 4.8 oz (109 kg)   Body mass index is 48.53 kg/m.  General:  Well nourished, well developed, in no acute distress, morbidly obese. HEENT: normal Lymph: no adenopathy Neck: no JVD obese Endocrine:  No thryomegaly Vascular: No carotid bruits; FA pulses 2+ bilaterally without bruits  Cardiac:  normal S1, S2; 1/6 high-pitched systolic murmur RRR;  Lungs: Diminished breath sounds in the bases, poor inspiratory effort, absent breath sounds in the left lower lobe.  Pain with inspiration, and induces coughing. Abd: soft, nontender, no hepatomegaly  Ext: no edema Musculoskeletal:  No deformities, BUE and BLE strength normal and equal Skin: warm and dry  Neuro:  CNs 2-12 intact, no focal abnormalities noted Psych:  Normal affect   EKG:  The EKG was personally reviewed and demonstrates: Sinus tachycardia, heart rate of 103 bpm, PACs, with some baseline wander.  Telemetry:  Telemetry was personally reviewed and demonstrates:   Sinus rhythm with sinus tachycardia.  Currently leads are off.  Relevant CV Studies: Echocardiogram 08/27/2017 PROCEDURE Very difficult test due to breast cancer, mastectomy and reconstructive  surgery on the left side. TVD. - SUMMARY The left ventricular size is normal.  Left ventricular systolic function  is normal. LV ejection fraction = 60-65%. No segmental wall motion abnormalities seen in the left ventricle The right ventricle is normal in size and function. There is moderate aortic stenosis. Aortic valve mean pressure gradient is 20 mmHg. Aortic valve peak pressure gradient is 39 mmHg. There is mild mitral regurgitation. IVC size was normal. There is no pericardial effusion. There is no comparison study available. - FINDINGS:  LEFT VENTRICLE The left ventricular size is normal. There is normal left ventricular wall  thickness. Left ventricular systolic function is normal. LV ejection fraction  = 60-65%. Left ventricular filling pattern is prolonged relaxation. No  segmental wall motion abnormalities seen in the left ventricle. -  RIGHT VENTRICLE The right ventricle is normal in size and function.  LEFT ATRIUM The left atrial size is normal.  RIGHT ATRIUM  Right atrial size is normal. - AORTIC VALVE Focal calcification of the aortic valve. There is trace aortic regurgitation.  Aortic valve mean pressure gradient is 20 mmHg. Aortic valve peak pressure  gradient is 39 mmHg. There is moderate aortic stenosis. - MITRAL VALVE There is moderate to severe mitral annular calcification. There is mild  mitral regurgitation. - TRICUSPID VALVE The tricuspid valve is normal in structure and function. There is trace  tricuspid regurgitation. - PULMONIC VALVE The pulmonic valve is not well visualized. There is no pulmonic valvular  regurgitation. - ARTERIES The aortic root is normal. - VENOUS IVC size was normal. - EFFUSION There is no pericardial effusion.  Laboratory Data:  Chemistry Recent Labs  Lab 11/17/17 1744 11/18/17 1052 11/19/17 0549  NA 138 129* 131*  K 4.3 4.5 4.2  CL 101 94* 92*  CO2 28 25 29   GLUCOSE 111* 251* 237*  BUN 19 19 31*  CREATININE 0.89 0.86 1.18*  CALCIUM 9.6 9.1 9.1  GFRNONAA >60 >60 46*  GFRAA >60 >60 53*  ANIONGAP 9 10 10       No results for input(s): PROT, ALBUMIN, AST, ALT, ALKPHOS, BILITOT in the last 168 hours. Hematology Recent Labs  Lab 11/17/17 1744  WBC 12.0*  RBC 3.11*  HGB 9.1*  HCT 29.0*  MCV 93.2  MCH 29.3  MCHC 31.4  RDW 15.0  PLT 268   Cardiac Enzymes Recent Labs  Lab 11/17/17 1744 11/17/17 2124  TROPONINI <0.03 <0.03   No results for input(s): TROPIPOC in the last 168 hours.   BNP Recent Labs  Lab 11/17/17 1744  BNP 55.0     Radiology/Studies:  Dg Chest Port 1 View  Result Date: 11/17/2017 CLINICAL DATA:  Cough.  Shortness of breath. EXAM: PORTABLE CHEST 1 VIEW COMPARISON:  January 05, 2017 FINDINGS: No pneumothorax. The heart size is borderline. The hila and mediastinum are normal. Bilateral hazy pulmonary infiltrates, left greater than right, particularly in the bases. No nodules or masses. No other acute abnormalities. IMPRESSION: Bilateral hazy infiltrates may represent multifocal pneumonia. Asymmetric edema is possible as well. Recommend clinical correlation and follow-up to resolution. Electronically Signed   By: Dorise Bullion III M.D   On: 11/17/2017 19:32    Assessment and Plan:   1.  Aortic valve stenosis: Moderate per recent echocardiogram.  Has been seen by primary cardiologist at [facility] on 10/23/2017.  His opinion was that her dyspnea was not clearly related to aortic valve stenosis, and likely to deconditioning and overall pulmonary status.  We will not repeat echocardiogram as one has just recently been completed in September of this year.  Continue heart rate control with carvedilol 3.125 mg twice daily.  If wheezing becomes an issue, can consider more cardioselective beta-blocker such as bisoprolol.  However will defer to her primary cardiologist for ongoing management.  2.  Possible pneumonia: Patient is currently on antibiotics and inhalers per primary team.  Continues cough and congestion soreness in her ribs.  3.  Chronic dyspnea:  Factorial.  Known history of asthma, is now being followed by pulmonologist [facility] Dr. Gardiner Rhyme.  Has recently been started on new medication, but has not filled due to insurance evaluation and approval.  Recommend continuation with primary pulmonologist for ongoing management.  4. Type II Diabetes: Management per primary care team.  For questions or updates, please contact [facility] Please consult [email or web address] for contact info under Cardiology/STEMI.   Signed, Phill Myron. West Pugh, ANP, [facility]  11/19/2017 10:22 AM    Attending note:  Patient seen and examined.  Reviewed records from [facility] regarding cardiac workup and discussed the case with Ms. Lawrence DNP as well as modifying the above note.  Ann Fuller is followed by Dr. Doneta Public at [facility], was seen recently, I reviewed the note.  She has moderate aortic stenosis with mean gradient 20 mmHg and peak gradient 39 mmHg.  Dobutamine echocardiogram from May of this year was negative for ischemia.  At baseline she has chronic dyspnea on exertion, more recently has been experiencing ankle and foot swelling, productive cough, feeling of "chills" but no fevers.  On examination this morning she reports no chest pain or palpitations.  No breathlessness at rest, but coughs easily when she takes in a deep breath.  No active wheezing.  Lungs exhibit diminished breath sounds, cardiac exam with 7-5/9 systolic murmur heard best at the left base.  No diastolic murmur.  She has mild lower leg edema.  Lab work reveals potassium 4.2, BUN 31, creatinine 1.18, troponin I less than 0.03, BNP 55, WBC 12.0.  I personally reviewed her ECG from 11/17/2017 which showed sinus tachycardia, no acute ST segment changes.  X-ray reports hazy bilateral infiltrates suggestive of potential multifocal pneumonia versus asymmetric edema.  Follow-up chest CT is pending.  From a cardiac perspective, further testing is not planned in light of her recent  workup through [facility]. Would not expect moderate aortic valve stenosis to be precipitating her current presentation and symptoms.  She could have acute on chronic diastolic heart failure with relative volume overload as a contributor, agree with IV Lasix for now.  She is concurrently being treated with antibiotics for possible pneumonia and steroids per primary team.  Satira Sark, M.D., F.A.C.C.

## 2017-11-20 DIAGNOSIS — J45901 Unspecified asthma with (acute) exacerbation: Secondary | ICD-10-CM

## 2017-11-20 DIAGNOSIS — K219 Gastro-esophageal reflux disease without esophagitis: Secondary | ICD-10-CM

## 2017-11-20 DIAGNOSIS — Z794 Long term (current) use of insulin: Secondary | ICD-10-CM

## 2017-11-20 DIAGNOSIS — E119 Type 2 diabetes mellitus without complications: Secondary | ICD-10-CM

## 2017-11-20 LAB — CBC WITH DIFFERENTIAL/PLATELET
Basophils Absolute: 0 10*3/uL (ref 0.0–0.1)
Basophils Relative: 0 %
Eosinophils Absolute: 0 10*3/uL (ref 0.0–0.7)
Eosinophils Relative: 0 %
HCT: 30.8 % — ABNORMAL LOW (ref 36.0–46.0)
Hemoglobin: 9.9 g/dL — ABNORMAL LOW (ref 12.0–15.0)
Lymphocytes Relative: 5 %
Lymphs Abs: 0.6 10*3/uL — ABNORMAL LOW (ref 0.7–4.0)
MCH: 29.3 pg (ref 26.0–34.0)
MCHC: 32.1 g/dL (ref 30.0–36.0)
MCV: 91.1 fL (ref 78.0–100.0)
Monocytes Absolute: 0.4 10*3/uL (ref 0.1–1.0)
Monocytes Relative: 3 %
Neutro Abs: 11.3 10*3/uL — ABNORMAL HIGH (ref 1.7–7.7)
Neutrophils Relative %: 92 %
Platelets: 309 10*3/uL (ref 150–400)
RBC: 3.38 MIL/uL — ABNORMAL LOW (ref 3.87–5.11)
RDW: 14.2 % (ref 11.5–15.5)
WBC: 12.3 10*3/uL — ABNORMAL HIGH (ref 4.0–10.5)

## 2017-11-20 LAB — CULTURE, RESPIRATORY W GRAM STAIN

## 2017-11-20 LAB — GLUCOSE, CAPILLARY
GLUCOSE-CAPILLARY: 126 mg/dL — AB (ref 65–99)
GLUCOSE-CAPILLARY: 107 mg/dL — AB (ref 65–99)
Glucose-Capillary: 172 mg/dL — ABNORMAL HIGH (ref 65–99)
Glucose-Capillary: 121 mg/dL — ABNORMAL HIGH (ref 65–99)

## 2017-11-20 LAB — RENAL FUNCTION PANEL
Albumin: 3.4 g/dL — ABNORMAL LOW (ref 3.5–5.0)
Anion gap: 11 (ref 5–15)
BUN: 43 mg/dL — ABNORMAL HIGH (ref 6–20)
CO2: 34 mmol/L — ABNORMAL HIGH (ref 22–32)
Calcium: 8.9 mg/dL (ref 8.9–10.3)
Chloride: 85 mmol/L — ABNORMAL LOW (ref 101–111)
Creatinine, Ser: 1.26 mg/dL — ABNORMAL HIGH (ref 0.44–1.00)
GFR calc Af Amer: 49 mL/min — ABNORMAL LOW (ref >60)
GFR calc non Af Amer: 42 mL/min — ABNORMAL LOW (ref >60)
Glucose, Bld: 162 mg/dL — ABNORMAL HIGH (ref 65–99)
Phosphorus: 4.7 mg/dL — ABNORMAL HIGH (ref 2.5–4.6)
Potassium: 3.8 mmol/L (ref 3.5–5.1)
Sodium: 130 mmol/L — ABNORMAL LOW (ref 135–145)

## 2017-11-20 LAB — CULTURE, RESPIRATORY: CULTURE: NORMAL

## 2017-11-20 MED ORDER — KETOROLAC TROMETHAMINE 15 MG/ML IJ SOLN
15.0000 mg | Freq: Once | INTRAMUSCULAR | Status: AC
  Administered 2017-11-20: 15 mg via INTRAVENOUS
  Filled 2017-11-20: qty 1

## 2017-11-20 MED ORDER — MOMETASONE FURO-FORMOTEROL FUM 100-5 MCG/ACT IN AERO
2.0000 | INHALATION_SPRAY | Freq: Two times a day (BID) | RESPIRATORY_TRACT | Status: DC
  Administered 2017-11-20 – 2017-11-21 (×2): 2 via RESPIRATORY_TRACT
  Filled 2017-11-20: qty 8.8

## 2017-11-20 MED ORDER — ACETAMINOPHEN 325 MG PO TABS
650.0000 mg | ORAL_TABLET | Freq: Four times a day (QID) | ORAL | Status: DC | PRN
  Administered 2017-11-20: 650 mg via ORAL
  Filled 2017-11-20: qty 2

## 2017-11-20 MED ORDER — LEVOFLOXACIN 750 MG PO TABS
750.0000 mg | ORAL_TABLET | Freq: Every day | ORAL | Status: DC
  Administered 2017-11-21 (×2): 750 mg via ORAL
  Filled 2017-11-20: qty 1

## 2017-11-20 MED ORDER — GUAIFENESIN ER 600 MG PO TB12
1200.0000 mg | ORAL_TABLET | Freq: Two times a day (BID) | ORAL | Status: DC
  Administered 2017-11-20 – 2017-11-21 (×3): 1200 mg via ORAL
  Filled 2017-11-20 (×3): qty 2

## 2017-11-20 MED ORDER — FUROSEMIDE 40 MG PO TABS
40.0000 mg | ORAL_TABLET | Freq: Every day | ORAL | Status: DC
  Administered 2017-11-20 – 2017-11-21 (×2): 40 mg via ORAL
  Filled 2017-11-20 (×2): qty 1

## 2017-11-20 NOTE — Progress Notes (Signed)
PROGRESS NOTE    Ann Fuller  TFT:[id number] DOB: 12-17-1946 DOA: 11/17/2017 PCP: Asencion Noble, MD    Brief Narrative:  71 year old female with history of eosinophilic asthma, previous breast cancer, presented to the hospital with progressive shortness of breath.  Felt to be related to decompensated CHF as well as a possible element of pneumonia.  She was treated with antibiotics and IV diuretics.  Seen by cardiology.  Also being treated with bronchodilators and intravenous steroids.  Overall respiratory status is improving.   Assessment & Plan:   Principal Problem:   CAP (community acquired pneumonia) Active Problems:   GERD (gastroesophageal reflux disease)   Type 2 diabetes mellitus (HCC)   Hyperlipidemia   Essential hypertension, benign   Insomnia   Diastolic dysfunction   Normocytic anemia   Acute asthma exacerbation   Lactic acidosis   Bulging lumbar disc   1. Acute on chronic diastolic congestive heart failure.  Patient was placed on intravenous Lasix.  Seen by cardiology.  She has diuresed 2300 cc with IV Lasix 60 mg twice daily and is feeling better.  Overall volume status appears to be improving.  She has been transitioned to oral Lasix.  Can follow-up with her primary cardiologist at [facility]. 2. Community-acquired pneumonia.  Chest CT indicated improving lung aeration.  She is on oral antibiotics. 3. History of eosinophilic asthma.  Currently on intravenous steroids.  Will restart inhaled steroids.  Continue pulmonary hygiene and bronchodilators. 4. Diabetes.  On Lantus and sliding scale insulin.  Blood sugars have been stable. 5. GERD.  Continue on PPI twice daily. 6. Hyperlipidemia.  Continue statin   DVT prophylaxis: Lovenox Code Status: Full code Family Communication: No family present Disposition Plan: Discharge home when improved, possibly in a.m.  Transfer to medical bed today.   Consultants:   Cardiology  Procedures:      Antimicrobials:   Levofloxacin   Subjective: She is feeling better.  She has a productive cough.  Sputum is thick and yellow in color.  Objective: Vitals:   11/20/17 0740 11/20/17 0745 11/20/17 0800 11/20/17 0900  BP:   131/70 (!) 125/59  Pulse: 89  97 100  Resp: (!) 23  (!) 21 (!) 25  Temp: 97.6 F (36.4 C)     TempSrc: Oral     SpO2: 96% 98% 96% 96%  Weight:      Height:        Intake/Output Summary (Last 24 hours) at 11/20/2017 1045 Last data filed at 11/19/2017 2000 Gross per 24 hour  Intake -  Output 2300 ml  Net -2300 ml   Filed Weights   11/17/17 1648 11/19/17 0500 11/20/17 0500  Weight: 107 kg (236 lb) 109 kg (240 lb 4.8 oz) 107.4 kg (236 lb 12.4 oz)    Examination:  General exam: Appears calm and comfortable  Respiratory system: Fair air movement bilaterally.  Crackles at left base. Respiratory effort normal. Cardiovascular system: S1 & S2 heard, RRR. No JVD, murmurs, rubs, gallops or clicks. No pedal edema. Gastrointestinal system: Abdomen is nondistended, soft and nontender. No organomegaly or masses felt. Normal bowel sounds heard. Central nervous system: Alert and oriented. No focal neurological deficits. Extremities: Symmetric 5 x 5 power. Skin: No rashes, lesions or ulcers Psychiatry: Judgement and insight appear normal. Mood & affect appropriate.     Data Reviewed: I have personally reviewed following labs and imaging studies  CBC: Recent Labs  Lab 11/17/17 1744 11/20/17 0543  WBC 12.0* 12.3*  NEUTROABS 9.6*  11.3*  HGB 9.1* 9.9*  HCT 29.0* 30.8*  MCV 93.2 91.1  PLT 268 081   Basic Metabolic Panel: Recent Labs  Lab 11/17/17 1744 11/18/17 1052 11/19/17 0549 11/20/17 0543  NA 138 129* 131* 130*  K 4.3 4.5 4.2 3.8  CL 101 94* 92* 85*  CO2 28 25 29  34*  GLUCOSE 111* 251* 237* 162*  BUN 19 19 31* 43*  CREATININE 0.89 0.86 1.18* 1.26*  CALCIUM 9.6 9.1 9.1 8.9  MG 1.7  --   --   --   PHOS 4.0  --   --  4.7*    GFR: Estimated Creatinine Clearance: 45.8 mL/min (A) (by C-G formula based on SCr of 1.26 mg/dL (H)). Liver Function Tests: Recent Labs  Lab 11/20/17 0543  ALBUMIN 3.4*   No results for input(s): LIPASE, AMYLASE in the last 168 hours. No results for input(s): AMMONIA in the last 168 hours. Coagulation Profile: No results for input(s): INR, PROTIME in the last 168 hours. Cardiac Enzymes: Recent Labs  Lab 11/17/17 1744 11/17/17 2124  TROPONINI <0.03 <0.03   BNP (last 3 results) No results for input(s): PROBNP in the last 8760 hours. HbA1C: No results for input(s): HGBA1C in the last 72 hours. CBG: Recent Labs  Lab 11/19/17 0748 11/19/17 1128 11/19/17 1630 11/19/17 2128 11/20/17 0737  GLUCAP 253* 128* 185* 106* 172*   Lipid Profile: No results for input(s): CHOL, HDL, LDLCALC, TRIG, CHOLHDL, LDLDIRECT in the last 72 hours. Thyroid Function Tests: No results for input(s): TSH, T4TOTAL, FREET4, T3FREE, THYROIDAB in the last 72 hours. Anemia Panel: No results for input(s): VITAMINB12, FOLATE, FERRITIN, TIBC, IRON, RETICCTPCT in the last 72 hours. Sepsis Labs: Recent Labs  Lab 11/17/17 2102 11/18/17 0017 11/18/17 0424 11/19/17 0809  PROCALCITON  --   --   --  <0.10  LATICACIDVEN 3.25* 2.1* 1.8  --     Recent Results (from the past 240 hour(s))  MRSA PCR Screening     Status: None   Collection Time: 11/17/17 11:00 PM  Result Value Ref Range Status   MRSA by PCR NEGATIVE NEGATIVE Final    Comment:        The GeneXpert MRSA Assay (FDA approved for NASAL specimens only), is one component of a comprehensive MRSA colonization surveillance program. It is not intended to diagnose MRSA infection nor to guide or monitor treatment for MRSA infections.   Culture, sputum-assessment     Status: None   Collection Time: 11/18/17  1:30 AM  Result Value Ref Range Status   Specimen Description SPUTUM  Final   Special Requests NONE  Final   Sputum evaluation THIS  SPECIMEN IS ACCEPTABLE FOR SPUTUM CULTURE  Final   Report Status 11/18/2017 FINAL  Final  Culture, respiratory (NON-Expectorated)     Status: None (Preliminary result)   Collection Time: 11/18/17  1:30 AM  Result Value Ref Range Status   Specimen Description SPUTUM  Final   Special Requests NONE Reflexed from [id number]  Final   Gram Stain   Final    RARE WBC PRESENT, PREDOMINANTLY MONONUCLEAR FEW SQUAMOUS EPITHELIAL CELLS PRESENT MODERATE GRAM POSITIVE COCCI IN PAIRS FEW GRAM POSITIVE RODS    Culture   Final    CULTURE REINCUBATED FOR BETTER GROWTH Performed at [facility], [place] [address], [place], [place] [address]    Report Status PENDING  Incomplete         Radiology Studies: Ct Chest Wo Contrast  Result Date: 11/19/2017 CLINICAL DATA:  Shortness of breath. Recent pneumonia. Remote history of breast cancer and left mastectomy. EXAM: CT CHEST WITHOUT CONTRAST TECHNIQUE: Multidetector CT imaging of the chest was performed following the standard protocol without IV contrast. COMPARISON:  Chest x-ray 11/17/2017.  Chest CT 05/30/2015 FINDINGS: Cardiovascular: Diffuse extensive coronary artery calcifications. Mitral valve and aortic valve calcifications. Diffuse aortic arch and descending thoracic aortic calcifications. No aneurysm. Heart is borderline in size. Mediastinum/Nodes: No mediastinal, hilar, or axillary adenopathy. Lungs/Pleura: Previously seen bilateral airspace opacities appear to have improved. Residual bibasilar opacities, left slightly greater than right could reflect atelectasis or resolving pneumonia. No effusions. Upper Abdomen: Imaging into the upper abdomen shows no acute findings. Musculoskeletal: Prior left mastectomy and left axillary nodal dissection. Left breast implants noted. No acute bony abnormality or focal bone lesion. IMPRESSION: Improving aeration of the lungs since prior chest x-ray, with residual bibasilar atelectasis or resolving infiltrates, left  slightly greater than right. Cardiomegaly.  Coronary artery disease. Aortic Atherosclerosis (ICD10-I70.0). Electronically Signed   By: Rolm Baptise M.D.   On: 11/19/2017 11:36        Scheduled Meds: . aspirin EC  81 mg Oral Daily  . atorvastatin  40 mg Oral QHS  . carvedilol  3.125 mg Oral BID WC  . doxazosin  4 mg Oral Daily  . enoxaparin (LOVENOX) injection  40 mg Subcutaneous Q24H  . exenatide  10 mcg Subcutaneous BID WC  . furosemide  40 mg Oral Daily  . guaiFENesin  1,200 mg Oral BID  . insulin aspart  0-20 Units Subcutaneous TID WC  . insulin glargine  26 Units Subcutaneous q morning - 10a  . ipratropium-albuterol  3 mL Nebulization Q6H  . irbesartan  75 mg Oral Daily  . [START ON 11/21/2017] levofloxacin  750 mg Oral Daily  . magnesium oxide  400 mg Oral Daily  . methylPREDNISolone (SOLU-MEDROL) injection  40 mg Intravenous Q12H  . montelukast  10 mg Oral QHS  . pantoprazole  40 mg Oral BID  . sertraline  50 mg Oral Daily   Continuous Infusions:   LOS: 3 days    Time spent: 63mins    Kathie Dike, MD [facility] Pager [phone]  If 7PM-7AM, please contact night-coverage [email or web address] Password [email or web address] 11/20/2017, 10:45 AM

## 2017-11-20 NOTE — Progress Notes (Signed)
Patient a/o.vss. Saline lock patent. Up ad lib with assistance. No complaints of any distress. Report called to Sharyn Lull, Therapist, sports. Patient transferred to 300 via wheelchair.

## 2017-11-20 NOTE — Progress Notes (Signed)
Physical Therapy Treatment Patient Details Name: Ann Fuller MRN: [id number] DOB: [birth year] Today's Date: 11/20/2017    History of Present Illness Ann Fuller is a 71 y.o. female with medical history significant of colon polyps, arthritis, adult onset asthma, history of breast cancer (1991, 1995), bulging lumbar disc, depression, diverticulosis, dyspepsia, GERD, hypertension, IBS, hemorrhoids, tubular adenoma, type 2 diabetes who is coming to the emergency department with progressively worse dyspnea associated with persistent nonproductive cough that occasionally induces nausea and wheezing for the past 5 days despite using her albuterol MDI and nebulizer solution at home.  She mentions that the the frequent coughing has given her pain in her lower ribs and she feels very fatigued.  She denies fever, chills, night sweats, rhinorrhea, hemoptysis, diaphoresis, dizziness, PND, orthopnea, abdominal pain, constipation, melena or hematochezia.  She denies dysuria, frequency or hematuria.  Denies sick contacts to her knowledge, travel history, environmental allergens, tobacco smoke or industrial fumes exposure.    PT Comments    Patient demonstrates increased tolerance for gait with improvement in standing balance, on room air throughout treatment with O2 sats between 94-97%, tolerated sitting up in chair and OK per RN to leave patient on room air.  Patient will benefit from continued physical therapy in hospital and recommended venue below to increase strength, balance, endurance for safe ADLs and gait.    Follow Up Recommendations  Home health PT;Supervision - Intermittent     Equipment Recommendations  None recommended by PT    Recommendations for Other Services       Precautions / Restrictions Precautions Precautions: Fall Precaution Comments: monitor O2 sats Restrictions Weight Bearing Restrictions: No    Mobility  Bed Mobility Overal bed mobility: Modified  Independent       Supine to sit: Modified independent (Device/Increase time) Sit to supine: Modified independent (Device/Increase time)   General bed mobility comments: with head of bed slightly raised  Transfers Overall transfer level: Needs assistance Equipment used: None Transfers: Sit to/from [facility] Sit to Stand: Supervision Stand pivot transfers: Supervision       General transfer comment: less swaying, slightly unsteady  Ambulation/Gait Ambulation/Gait assistance: Supervision Ambulation Distance (Feet): 90 Feet Assistive device: Rolling walker (2 wheeled) Gait Pattern/deviations: Decreased step length - right;Decreased step length - left;Decreased stride length   Gait velocity interpretation: Below normal speed for age/gender General Gait Details: slow labored cadence without loss of balance, on room air with O2 sats remaining between 94-97%   Stairs            Wheelchair Mobility    Modified Rankin (Stroke Patients Only)       Balance Overall balance assessment: Needs assistance Sitting-balance support: No upper extremity supported;Feet supported Sitting balance-Leahy Scale: Good     Standing balance support: No upper extremity supported;During functional activity Standing balance-Leahy Scale: Fair Standing balance comment: good  using RW                            Cognition Arousal/Alertness: Awake/alert Behavior During Therapy: WFL for tasks assessed/performed Overall Cognitive Status: Within Functional Limits for tasks assessed                                        Exercises General Exercises - Lower Extremity Ankle Circles/Pumps: Seated;AROM;Strengthening;Both;10 reps Long Arc Quad: Seated;AROM;Strengthening;Both;10 reps Hip Flexion/Marching: AROM;Seated;Strengthening;Both;10 reps  General Comments        Pertinent Vitals/Pain Pain Assessment: No/denies pain    Home Living                       Prior Function            PT Goals (current goals can now be found in the care plan section) Acute Rehab PT Goals Patient Stated Goal: return home Time For Goal Achievement: 11/22/17 Potential to Achieve Goals: Good Progress towards PT goals: Progressing toward goals    Frequency    Min 3X/week      PT Plan Current plan remains appropriate    Co-evaluation              AM-PAC PT "6 Clicks" Daily Activity  Outcome Measure  Difficulty turning over in bed (including adjusting bedclothes, sheets and blankets)?: None Difficulty moving from lying on back to sitting on the side of the bed? : None Difficulty sitting down on and standing up from a chair with arms (e.g., wheelchair, bedside commode, etc,.)?: A Little Help needed moving to and from a bed to chair (including a wheelchair)?: A Little Help needed walking in hospital room?: A Little Help needed climbing 3-5 steps with a railing? : A Little 6 Click Score: 20    End of Session Equipment Utilized During Treatment: Gait belt Activity Tolerance: Patient tolerated treatment well Patient left: in chair;with call bell/phone within reach Nurse Communication: Mobility status PT Visit Diagnosis: Unsteadiness on feet (R26.81);Other abnormalities of gait and mobility (R26.89);Muscle weakness (generalized) (M62.81)     Time: 1610-9604 PT Time Calculation (min) (ACUTE ONLY): 25 min  Charges:  $Therapeutic Activity: 23-37 mins                    G Codes:       11:50 AM, 11-21-2017 Lonell Grandchild, MPT Physical Therapist with [facility] [phone] office [phone] mobile phone

## 2017-11-20 NOTE — Care Management Note (Signed)
Case Management Note  Patient Details  Name: Ann Fuller MRN: [id number] Date of Birth: Aug 18, 1946   Action/Plan: Patient recommended for [facility] PT. She is agreeable. Gave list of home health agencies, patient will let CM know.   Expected Discharge Date:    11/21/2017              Expected Discharge Plan:  [place]  In-House Referral:     Discharge planning Services  CM Consult  Post Acute Care Choice:  Home Health Choice offered to:  Patient  DME Arranged:    DME Agency:     HH Arranged:    [place] Agency:     Status of Service:  In process, will continue to follow  If discussed at Long Length of Stay Meetings, dates discussed:    Additional Comments:  Hazelle Woollard, Chauncey Reading, RN 11/20/2017, 2:50 PM

## 2017-11-20 NOTE — Progress Notes (Addendum)
Progress Note  Patient Name: Ann Fuller Date of Encounter: 11/20/2017  Primary Cardiologist:  Howell Rucks, MD [facility])   Subjective   Breathing easier.  Still has intermittent cough.  No chest pain or palpitations.  Inpatient Medications    Scheduled Meds: . aspirin EC  81 mg Oral Daily  . atorvastatin  40 mg Oral QHS  . carvedilol  3.125 mg Oral BID WC  . doxazosin  4 mg Oral Daily  . enoxaparin (LOVENOX) injection  40 mg Subcutaneous Q24H  . exenatide  10 mcg Subcutaneous BID WC  . furosemide  60 mg Intravenous Q12H  . insulin aspart  0-20 Units Subcutaneous TID WC  . insulin glargine  26 Units Subcutaneous q morning - 10a  . ipratropium-albuterol  3 mL Nebulization Q6H  . irbesartan  75 mg Oral Daily  . levofloxacin  250 mg Oral Daily  . magnesium oxide  400 mg Oral Daily  . methylPREDNISolone (SOLU-MEDROL) injection  40 mg Intravenous Q12H  . montelukast  10 mg Oral QHS  . pantoprazole  40 mg Oral BID  . sertraline  50 mg Oral Daily    PRN Meds: albuterol, baclofen, benzonatate, dicyclomine, diphenhydrAMINE, LORazepam, phenol, temazepam   Vital Signs    Vitals:   11/20/17 0700 11/20/17 0740 11/20/17 0745 11/20/17 0800  BP: (!) 112/55   131/70  Pulse: 86 89  97  Resp: 15 (!) 23  (!) 21  Temp:  97.6 F (36.4 C)    TempSrc:  Oral    SpO2: 96% 96% 98% 96%  Weight:      Height:        Intake/Output Summary (Last 24 hours) at 11/20/2017 0912 Last data filed at 11/19/2017 2000 Gross per 24 hour  Intake -  Output 2300 ml  Net -2300 ml   Filed Weights   11/17/17 1648 11/19/17 0500 11/20/17 0500  Weight: 236 lb (107 kg) 240 lb 4.8 oz (109 kg) 236 lb 12.4 oz (107.4 kg)    Telemetry    NSR in 90's - Personally Reviewed  Physical Exam    GEN: Obese woman.  No acute distress.   Neck: No JVD Cardiac: RRR, 2/6 sys murmur LSB Respiratory: Decreased breath sounds with scattered rhonchi GI: Soft, nontender, non-distended  MS: No edema; No  deformity. Neuro:  Nonfocal  Psych: Normal affect   Labs    Chemistry Recent Labs  Lab 11/18/17 1052 11/19/17 0549 11/20/17 0543  NA 129* 131* 130*  K 4.5 4.2 3.8  CL 94* 92* 85*  CO2 25 29 34*  GLUCOSE 251* 237* 162*  BUN 19 31* 43*  CREATININE 0.86 1.18* 1.26*  CALCIUM 9.1 9.1 8.9  ALBUMIN  --   --  3.4*  GFRNONAA >60 46* 42*  GFRAA >60 53* 49*  ANIONGAP 10 10 11      Hematology Recent Labs  Lab 11/17/17 1744 11/20/17 0543  WBC 12.0* 12.3*  RBC 3.11* 3.38*  HGB 9.1* 9.9*  HCT 29.0* 30.8*  MCV 93.2 91.1  MCH 29.3 29.3  MCHC 31.4 32.1  RDW 15.0 14.2  PLT 268 309    Cardiac Enzymes Recent Labs  Lab 11/17/17 1744 11/17/17 2124  TROPONINI <0.03 <0.03   No results for input(s): TROPIPOC in the last 168 hours.   BNP Recent Labs  Lab 11/17/17 1744  BNP 55.0     Radiology    Ct Chest Wo Contrast  Result Date: 11/19/2017 CLINICAL DATA:  Shortness of breath. Recent pneumonia.  Remote history of breast cancer and left mastectomy. EXAM: CT CHEST WITHOUT CONTRAST TECHNIQUE: Multidetector CT imaging of the chest was performed following the standard protocol without IV contrast. COMPARISON:  Chest x-ray 11/17/2017.  Chest CT 05/30/2015 FINDINGS: Cardiovascular: Diffuse extensive coronary artery calcifications. Mitral valve and aortic valve calcifications. Diffuse aortic arch and descending thoracic aortic calcifications. No aneurysm. Heart is borderline in size. Mediastinum/Nodes: No mediastinal, hilar, or axillary adenopathy. Lungs/Pleura: Previously seen bilateral airspace opacities appear to have improved. Residual bibasilar opacities, left slightly greater than right could reflect atelectasis or resolving pneumonia. No effusions. Upper Abdomen: Imaging into the upper abdomen shows no acute findings. Musculoskeletal: Prior left mastectomy and left axillary nodal dissection. Left breast implants noted. No acute bony abnormality or focal bone lesion. IMPRESSION:  Improving aeration of the lungs since prior chest x-ray, with residual bibasilar atelectasis or resolving infiltrates, left slightly greater than right. Cardiomegaly.  Coronary artery disease. Aortic Atherosclerosis (ICD10-I70.0). Electronically Signed   By: Rolm Baptise M.D.   On: 11/19/2017 11:36    Cardiac Studies   Echocardiogram 08/27/2017 PROCEDURE Very difficult test due to breast cancer, mastectomy and reconstructive  surgery on the left side. TVD. - SUMMARY The left ventricular size is normal.  Left ventricular systolic function is normal. LV ejection fraction = 60-65%. No segmental wall motion abnormalities seen in the left ventricle The right ventricle is normal in size and function. There is moderate aortic stenosis. Aortic valve mean pressure gradient is 20 mmHg. Aortic valve peak pressure gradient is 39 mmHg. There is mild mitral regurgitation. IVC size was normal. There is no pericardial effusion. There is no comparison study available. - FINDINGS:  LEFT VENTRICLE The left ventricular size is normal. There is normal left ventricular wall  thickness. Left ventricular systolic function is normal. LV ejection fraction  = 60-65%. Left ventricular filling pattern is prolonged relaxation. No  segmental wall motion abnormalities seen in the left ventricle. -  RIGHT VENTRICLE The right ventricle is normal in size and function.  LEFT ATRIUM The left atrial size is normal.  RIGHT ATRIUM  Right atrial size is normal. - AORTIC VALVE Focal calcification of the aortic valve. There is trace aortic regurgitation.  Aortic valve mean pressure gradient is 20 mmHg. Aortic valve peak pressure  gradient is 39 mmHg. There is moderate aortic stenosis. - MITRAL VALVE There is moderate to severe mitral annular calcification. There is mild  mitral regurgitation. - TRICUSPID VALVE The tricuspid valve is normal in structure and function. There is trace  tricuspid  regurgitation. - PULMONIC VALVE The pulmonic valve is not well visualized. There is no pulmonic valvular  regurgitation. - ARTERIES The aortic root is normal. - VENOUS IVC size was normal. - EFFUSION There is no pericardial effusion.   Patient Profile     71 y.o. female  with a hx of moderate aortic valve stenosis, chronic dyspnea, asthma, type 2 diabetes, chronic diastolic heart failure, breast cancer, arthritis, multiple medical problems to include morbid obesity, IBS, and GERD admitted with a possible community-acquired pneumonia and potential component of diastolic heart failure.  Assessment & Plan    1.  Aortic valve stenosis: Moderate on 08/2017 echocardiogram - mean gradient 20 mmHG, peak 39 mmHg.  Followed by primary cardiologist at [facility].  Doubt that this represents a major contributor to her current symptoms.   2.  Possible pneumonia: Patient is currently on antibiotics and inhalers per primary team.  Chest CT from yesterday indicated improving lung aeration with residual bibasilar  atelectasis and/or resolving infiltrates.   3.   Possible component of acute on chronic diastolic heart failure : Diuresed 2300 cc with IV Lasix 60 mg BID and feeling better. Not on lasix at home but may benefit from low dose. Will change to oral.  4. Type II Diabetes: Management per primary care team.   For questions or updates, please contact [place] Please consult [email or web address] for contact info under Cardiology/STEMI.      Signed, Ermalinda Barrios, PA-C  11/20/2017, 9:12 AM     Attending note:  Patient seen and examined.  I reviewed interval hospital course and discussed the case with Ms. Bonnell Public PA-C.  Patient states that she is feeling better today.  Still has intermittent cough but breathing is better in general.  She is being treated for possible community-acquired pneumonia and also potential component of acute on chronic diastolic heart failure.  She has diuresed over 2 L  since admission on IV Lasix.  On examination heart rate is in the 90s in sinus rhythm.  Systolic blood pressure 431V-400Q.  Lungs exhibit scattered rhonchi at the bases, no wheezing.  Cardiac exam reveals RRR with 6-7/6 systolic murmur.  Follow-up lab work shows potassium 3.8, BUN 43, creatinine 1.26 which is up from 1.18.  Chest CT imaging from yesterday indicated improving lung aeration with residual bibasilar atelectasis and/or resolving infiltrates.  From a cardiac perspective, would recommend continuing medical therapy including aspirin, Lipitor, Coreg, and Avapro.  Discontinue IV Lasix and consider placing her on low-dose Lasix for outpatient use.  She will need to keep follow-up with her regular cardiologist.  No further cardiac testing is planned at this time.  We will sign off.  Satira Sark, M.D., F.A.C.C.

## 2017-11-21 DIAGNOSIS — I10 Essential (primary) hypertension: Secondary | ICD-10-CM

## 2017-11-21 LAB — RENAL FUNCTION PANEL
Albumin: 3.5 g/dL (ref 3.5–5.0)
Anion gap: 11 (ref 5–15)
BUN: 52 mg/dL — ABNORMAL HIGH (ref 6–20)
CO2: 34 mmol/L — ABNORMAL HIGH (ref 22–32)
Calcium: 9.1 mg/dL (ref 8.9–10.3)
Chloride: 84 mmol/L — ABNORMAL LOW (ref 101–111)
Creatinine, Ser: 1.28 mg/dL — ABNORMAL HIGH (ref 0.44–1.00)
GFR calc Af Amer: 48 mL/min — ABNORMAL LOW (ref >60)
GFR calc non Af Amer: 42 mL/min — ABNORMAL LOW (ref >60)
Glucose, Bld: 173 mg/dL — ABNORMAL HIGH (ref 65–99)
Phosphorus: 4.5 mg/dL (ref 2.5–4.6)
Potassium: 3.7 mmol/L (ref 3.5–5.1)
Sodium: 129 mmol/L — ABNORMAL LOW (ref 135–145)

## 2017-11-21 LAB — GLUCOSE, CAPILLARY
Glucose-Capillary: 280 mg/dL — ABNORMAL HIGH (ref 65–99)
Glucose-Capillary: 70 mg/dL (ref 65–99)
Glucose-Capillary: 135 mg/dL — ABNORMAL HIGH (ref 65–99)

## 2017-11-21 LAB — CBC WITH DIFFERENTIAL/PLATELET
Basophils Absolute: 0 10*3/uL (ref 0.0–0.1)
Basophils Relative: 0 %
Eosinophils Absolute: 0 10*3/uL (ref 0.0–0.7)
Eosinophils Relative: 0 %
HCT: 33.2 % — ABNORMAL LOW (ref 36.0–46.0)
Hemoglobin: 10.6 g/dL — ABNORMAL LOW (ref 12.0–15.0)
Lymphocytes Relative: 5 %
Lymphs Abs: 0.7 10*3/uL (ref 0.7–4.0)
MCH: 28.8 pg (ref 26.0–34.0)
MCHC: 31.9 g/dL (ref 30.0–36.0)
MCV: 90.2 fL (ref 78.0–100.0)
Monocytes Absolute: 0.6 10*3/uL (ref 0.1–1.0)
Monocytes Relative: 5 %
Neutro Abs: 11.5 10*3/uL — ABNORMAL HIGH (ref 1.7–7.7)
Neutrophils Relative %: 90 %
Platelets: 337 10*3/uL (ref 150–400)
RBC: 3.68 MIL/uL — ABNORMAL LOW (ref 3.87–5.11)
RDW: 14 % (ref 11.5–15.5)
WBC: 12.7 10*3/uL — ABNORMAL HIGH (ref 4.0–10.5)

## 2017-11-21 MED ORDER — PREDNISONE 10 MG PO TABS: ORAL_TABLET | ORAL | 0 refills | Status: DC

## 2017-11-21 MED ORDER — FUROSEMIDE 20 MG PO TABS: 20.0000 mg | ORAL_TABLET | Freq: Every day | ORAL | 0 refills | Status: DC

## 2017-11-21 MED ORDER — LEVOFLOXACIN 750 MG PO TABS
750.0000 mg | ORAL_TABLET | Freq: Every day | ORAL | 0 refills | Status: DC
Start: 2017-11-22 — End: 2018-02-20

## 2017-11-21 MED ORDER — CARVEDILOL 3.125 MG PO TABS: 3.1250 mg | ORAL_TABLET | Freq: Two times a day (BID) | ORAL | 1 refills | Status: AC

## 2017-11-21 MED ORDER — GUAIFENESIN ER 600 MG PO TB12: 600.0000 mg | ORAL_TABLET | Freq: Two times a day (BID) | ORAL | 0 refills | Status: DC

## 2017-11-21 NOTE — ACP (Advance Care Planning) (Signed)
Followed up on discussion of Advance Directives. She wanted to contact me to complete once she leaves the hospital.

## 2017-11-21 NOTE — Progress Notes (Signed)
Physical Therapy Treatment Patient Details Name: Ann Fuller MRN: [id number] DOB: 1946-10-19 Today's Date: 11/21/2017    History of Present Illness Ann Fuller is a 71 y.o. female with medical history significant of colon polyps, arthritis, adult onset asthma, history of breast cancer (1991, 1995), bulging lumbar disc, depression, diverticulosis, dyspepsia, GERD, hypertension, IBS, hemorrhoids, tubular adenoma, type 2 diabetes who is coming to the emergency department with progressively worse dyspnea associated with persistent nonproductive cough that occasionally induces nausea and wheezing for the past 5 days despite using her albuterol MDI and nebulizer solution at home.  She mentions that the the frequent coughing has given her pain in her lower ribs and she feels very fatigued.  She denies fever, chills, night sweats, rhinorrhea, hemoptysis, diaphoresis, dizziness, PND, orthopnea, abdominal pain, constipation, melena or hematochezia.  She denies dysuria, frequency or hematuria.  Denies sick contacts to her knowledge, travel history, environmental allergens, tobacco smoke or industrial fumes exposure.    PT Comments    Patient demonstrates increased BLE strength and improved balance for transfers and gait training, able to ambulate in hallway without use of an assistive device, had to use side rail when taking standing rest break before walking back to bedside and tolerated staying up in chair after therapy.  Patient will benefit from continued physical therapy in hospital and recommended venue below to increase strength, balance, endurance for safe ADLs and gait.    Follow Up Recommendations  Home health PT;Supervision - Intermittent     Equipment Recommendations  None recommended by PT    Recommendations for Other Services       Precautions / Restrictions Precautions Precautions: Fall Restrictions Weight Bearing Restrictions: No    Mobility  Bed Mobility Overal  bed mobility: Modified Independent                Transfers Overall transfer level: Modified independent   Transfers: Sit to/from Stand;Stand Pivot Transfers Sit to Stand: Modified independent (Device/Increase time) Stand pivot transfers: Modified independent (Device/Increase time)          Ambulation/Gait Ambulation/Gait assistance: Modified independent (Device/Increase time) Ambulation Distance (Feet): 80 Feet Assistive device: None Gait Pattern/deviations: WFL(Within Functional Limits)   Gait velocity interpretation: Below normal speed for age/gender General Gait Details: grossly WFL except slower than normal cadence and had to take a rest break by leaning on siderail before ambulating back to room   Stairs            Wheelchair Mobility    Modified Rankin (Stroke Patients Only)       Balance Overall balance assessment: Needs assistance Sitting-balance support: No upper extremity supported;Feet supported Sitting balance-Leahy Scale: Good     Standing balance support: No upper extremity supported;During functional activity Standing balance-Leahy Scale: Good                              Cognition Arousal/Alertness: Awake/alert Behavior During Therapy: WFL for tasks assessed/performed Overall Cognitive Status: Within Functional Limits for tasks assessed                                        Exercises General Exercises - Lower Extremity Long Arc Quad: Seated;AROM;Strengthening;Both;10 reps Hip Flexion/Marching: AROM;Seated;Strengthening;Both;10 reps Toe Raises: Seated;AROM;Strengthening;Both;10 reps Heel Raises: Seated;AROM;Strengthening;Both;10 reps    General Comments        Pertinent Vitals/Pain Pain  Assessment: No/denies pain    Home Living                      Prior Function            PT Goals (current goals can now be found in the care plan section) Acute Rehab PT Goals Patient Stated Goal:  return home Time For Goal Achievement: 11/22/17 Potential to Achieve Goals: Good Progress towards PT goals: Progressing toward goals    Frequency    Min 3X/week      PT Plan Current plan remains appropriate    Co-evaluation              AM-PAC PT "6 Clicks" Daily Activity  Outcome Measure    Difficulty moving from lying on back to sitting on the side of the bed? : None Difficulty sitting down on and standing up from a chair with arms (e.g., wheelchair, bedside commode, etc,.)?: None Help needed moving to and from a bed to chair (including a wheelchair)?: None Help needed walking in hospital room?: A Little Help needed climbing 3-5 steps with a railing? : A Little 6 Click Score: 18    End of Session Equipment Utilized During Treatment: Gait belt Activity Tolerance: Patient tolerated treatment well Patient left: in chair;with call bell/phone within reach Nurse Communication: Mobility status PT Visit Diagnosis: Unsteadiness on feet (R26.81);Other abnormalities of gait and mobility (R26.89);Muscle weakness (generalized) (M62.81)     Time: 4332-9518 PT Time Calculation (min) (ACUTE ONLY): 25 min  Charges:  $Therapeutic Activity: 23-37 mins                    G Codes:       11:50 AM, 12-12-2017 Lonell Grandchild, MPT Physical Therapist with [facility] [phone] office [phone] mobile phone

## 2017-11-21 NOTE — Discharge Summary (Signed)
Physician Discharge Summary  MAREE AINLEY OBS:[id number] DOB: 06-10-1946 DOA: 11/17/2017  PCP: Asencion Noble, MD  Admit date: 11/17/2017 Discharge date: 11/21/2017  Admitted From: Home Disposition: Home  Recommendations for Outpatient Follow-up:  1. Follow up with PCP in 1-2 weeks 2. Please obtain BMP/CBC in one week 3. Follow-up with cardiologist and pulmonologist at [facility]: Equipment/Devices:  Discharge Condition: Stable CODE STATUS: Full code Diet recommendation: Heart Healthy / Carb Modified  Brief/Interim Summary: 71 year old female with history of eosinophilic asthma, previous breast cancer, presented to the hospital with progressive shortness of breath.  Felt to be related to decompensated CHF as well as a possible element of pneumonia.  She was treated with antibiotics and IV diuretics.  Seen by cardiology.  Also being treated with bronchodilators and intravenous steroids.    Discharge Diagnoses:  Principal Problem:   CAP (community acquired pneumonia) Active Problems:   GERD (gastroesophageal reflux disease)   Type 2 diabetes mellitus (HCC)   Hyperlipidemia   Essential hypertension, benign   Insomnia   Diastolic dysfunction   Normocytic anemia   Acute asthma exacerbation   Lactic acidosis   Bulging lumbar disc   1. Acute on chronic diastolic congestive heart failure.  Patient was placed on intravenous Lasix.  Seen by cardiology.  She had diuresed 4.5L with IV Lasix 60 mg twice daily and is feeling better.  Overall volume status appears to be improving.  She has been transitioned to oral Lasix.  Can follow-up with her primary cardiologist at [facility]. 2. Community-acquired pneumonia.  Chest CT indicated improving lung aeration.  She was treated with oral levofloxacin.. 3. History of eosinophilic asthma.    Treated with intravenous steroids.    Wheezing and shortness of breath has improved.  She has been placed on a  prednisone taper.  Continue on bronchodilators and pulmonary hygiene. 4. Diabetes.  On Lantus and sliding scale insulin.  Blood sugars have been stable. 5. GERD.  Continue on PPI twice daily. 6. Hyperlipidemia.  Continue statin    Discharge Instructions  Discharge Instructions    Diet - low sodium heart healthy   Complete by:  As directed    Increase activity slowly   Complete by:  As directed      Allergies as of 11/21/2017      Reactions   Ceftin [cefuroxime Axetil] Swelling   Codeine Swelling   Tongue swells   Metformin And Related Diarrhea   Nexium [esomeprazole Magnesium] Diarrhea   Omnicef [cefdinir] Diarrhea   Sulfa Antibiotics Rash      Medication List    STOP taking these medications   chlorthalidone 25 MG tablet Commonly known as:  HYGROTON     TAKE these medications   AEROCHAMBER MV inhaler Use as instructed   albuterol 108 (90 Base) MCG/ACT inhaler Commonly known as:  PROVENTIL HFA;VENTOLIN HFA Inhale 2 puffs into the lungs every 4 (four) hours as needed for wheezing or shortness of breath.   amLODipine 5 MG tablet Commonly known as:  NORVASC Take 5 mg by mouth daily.   aspirin EC 81 MG tablet Take 81 mg by mouth daily.   baclofen 10 MG tablet Commonly known as:  LIORESAL Take 5 mg by mouth daily as needed for muscle spasms.   BENICAR 40 MG tablet Generic drug:  olmesartan Take 40 mg by mouth daily.   benzonatate 200 MG capsule Commonly known as:  TESSALON TAKE ONE CAPSULE BY MOUTH EVERY 6 HOURS FOR COUGH  What changed:  See the new instructions.   budesonide-formoterol 80-4.5 MCG/ACT inhaler Commonly known as:  SYMBICORT Inhale 2 puffs into the lungs 2 (two) times daily.   BYETTA 10 MCG PEN 10 MCG/0.04ML Sopn injection Generic drug:  exenatide Inject 10 mcg into the skin 2 (two) times daily with a meal.   carvedilol 3.125 MG tablet Commonly known as:  COREG Take 1 tablet (3.125 mg total) by mouth 2 (two) times daily with a  meal. Start taking on:  11/22/2017   chlorpheniramine 4 MG tablet Commonly known as:  CHLOR-TRIMETON Take 4 mg by mouth every 4 (four) hours as needed (drippy nose, drainage, throat clearing).   dicyclomine 10 MG capsule Commonly known as:  BENTYL TAKE ONE CAPSULE BY MOUTH PRIOR TO MEALSAS NEEDED. NO MORE THAN FOUR TIMES DAILY. MAY CAUSE DROWINESS.   doxazosin 2 MG tablet Commonly known as:  CARDURA Take 4 mg by mouth every morning.   furosemide 20 MG tablet Commonly known as:  LASIX Take 1 tablet (20 mg total) by mouth daily. Start taking on:  11/22/2017   guaiFENesin 600 MG 12 hr tablet Commonly known as:  MUCINEX Take 1 tablet (600 mg total) by mouth 2 (two) times daily.   HUMALOG KWIKPEN Bearden Inject 6 Units into the skin 2 (two) times daily.   Insulin Glargine 100 UNIT/ML Solostar Pen Commonly known as:  LANTUS Inject 26 Units into the skin every morning.   ipratropium-albuterol 0.5-2.5 (3) MG/3ML Soln Commonly known as:  DUONEB Take 3 mLs by nebulization every 4 (four) hours. What changed:    when to take this  reasons to take this   levofloxacin 750 MG tablet Commonly known as:  LEVAQUIN Take 1 tablet (750 mg total) by mouth daily. Start taking on:  11/22/2017   LIPITOR 40 MG tablet Generic drug:  atorvastatin Take 40 mg by mouth at bedtime.   LORazepam 1 MG tablet Commonly known as:  ATIVAN Take 1 mg by mouth 2 (two) times daily as needed. Anxiety/Sleep   magnesium oxide 400 MG tablet Commonly known as:  MAG-OX Take 400 mg by mouth daily.   montelukast 10 MG tablet Commonly known as:  SINGULAIR TAKE ONE TABLET BY MOUTH EVERY NIGHT AT BEDTIME   pantoprazole 40 MG tablet Commonly known as:  PROTONIX TAKE ONE TABLET TWICE DAILY BEFORE A MEAL   predniSONE 10 MG tablet Commonly known as:  DELTASONE Take 40mg  po daily for 2 days then 30mg  daily for 2 days then 20mg  daily for 2 days then 10mg  daily for 2 days then stop   sertraline 50 MG  tablet Commonly known as:  ZOLOFT Take 50 mg by mouth.   temazepam 15 MG capsule Commonly known as:  RESTORIL Take 15 mg by mouth at bedtime as needed for sleep.   vitamin C 500 MG tablet Commonly known as:  ASCORBIC ACID Take 500 mg by mouth daily as needed.       Allergies  Allergen Reactions  . Ceftin [Cefuroxime Axetil] Swelling  . Codeine Swelling    Tongue swells  . Metformin And Related Diarrhea  . Nexium [Esomeprazole Magnesium] Diarrhea  . Omnicef [Cefdinir] Diarrhea  . Sulfa Antibiotics Rash    Consultations:  Cardiology   Procedures/Studies: Ct Chest Wo Contrast  Result Date: 11/19/2017 CLINICAL DATA:  Shortness of breath. Recent pneumonia. Remote history of breast cancer and left mastectomy. EXAM: CT CHEST WITHOUT CONTRAST TECHNIQUE: Multidetector CT imaging of the chest was performed following the standard protocol  without IV contrast. COMPARISON:  Chest x-ray 11/17/2017.  Chest CT 05/30/2015 FINDINGS: Cardiovascular: Diffuse extensive coronary artery calcifications. Mitral valve and aortic valve calcifications. Diffuse aortic arch and descending thoracic aortic calcifications. No aneurysm. Heart is borderline in size. Mediastinum/Nodes: No mediastinal, hilar, or axillary adenopathy. Lungs/Pleura: Previously seen bilateral airspace opacities appear to have improved. Residual bibasilar opacities, left slightly greater than right could reflect atelectasis or resolving pneumonia. No effusions. Upper Abdomen: Imaging into the upper abdomen shows no acute findings. Musculoskeletal: Prior left mastectomy and left axillary nodal dissection. Left breast implants noted. No acute bony abnormality or focal bone lesion. IMPRESSION: Improving aeration of the lungs since prior chest x-ray, with residual bibasilar atelectasis or resolving infiltrates, left slightly greater than right. Cardiomegaly.  Coronary artery disease. Aortic Atherosclerosis (ICD10-I70.0). Electronically Signed    By: Rolm Baptise M.D.   On: 11/19/2017 11:36   Dg Chest Port 1 View  Result Date: 11/17/2017 CLINICAL DATA:  Cough.  Shortness of breath. EXAM: PORTABLE CHEST 1 VIEW COMPARISON:  January 05, 2017 FINDINGS: No pneumothorax. The heart size is borderline. The hila and mediastinum are normal. Bilateral hazy pulmonary infiltrates, left greater than right, particularly in the bases. No nodules or masses. No other acute abnormalities. IMPRESSION: Bilateral hazy infiltrates may represent multifocal pneumonia. Asymmetric edema is possible as well. Recommend clinical correlation and follow-up to resolution. Electronically Signed   By: Dorise Bullion III M.D   On: 11/17/2017 19:32       Subjective: Feeling better.  Shortness of breath has improved.  Wheezing has resolved.  Discharge Exam: Vitals:   11/21/17 1300 11/21/17 1449  BP: 129/60   Pulse: 76   Resp: 18   Temp: (!) 97.5 F (36.4 C)   SpO2: 92% 96%   Vitals:   11/21/17 0755 11/21/17 0807 11/21/17 1300 11/21/17 1449  BP:   129/60   Pulse:   76   Resp:   18   Temp:   (!) 97.5 F (36.4 C)   TempSrc:   Oral   SpO2: 97% 98% 92% 96%  Weight:      Height:        General: Pt is alert, awake, not in acute distress Cardiovascular: RRR, S1/S2 +, no rubs, no gallops Respiratory: CTA bilaterally, no wheezing, no rhonchi Abdominal: Soft, NT, ND, bowel sounds + Extremities: no edema, no cyanosis    The results of significant diagnostics from this hospitalization (including imaging, microbiology, ancillary and laboratory) are listed below for reference.     Microbiology: Recent Results (from the past 240 hour(s))  MRSA PCR Screening     Status: None   Collection Time: 11/17/17 11:00 PM  Result Value Ref Range Status   MRSA by PCR NEGATIVE NEGATIVE Final    Comment:        The GeneXpert MRSA Assay (FDA approved for NASAL specimens only), is one component of a comprehensive MRSA colonization surveillance program. It is  not intended to diagnose MRSA infection nor to guide or monitor treatment for MRSA infections.   Culture, sputum-assessment     Status: None   Collection Time: 11/18/17  1:30 AM  Result Value Ref Range Status   Specimen Description SPUTUM  Final   Special Requests NONE  Final   Sputum evaluation THIS SPECIMEN IS ACCEPTABLE FOR SPUTUM CULTURE  Final   Report Status 11/18/2017 FINAL  Final  Culture, respiratory (NON-Expectorated)     Status: None   Collection Time: 11/18/17  1:30 AM  Result Value  Ref Range Status   Specimen Description SPUTUM  Final   Special Requests NONE Reflexed from [id number]  Final   Gram Stain   Final    RARE WBC PRESENT, PREDOMINANTLY MONONUCLEAR FEW SQUAMOUS EPITHELIAL CELLS PRESENT MODERATE GRAM POSITIVE COCCI IN PAIRS FEW GRAM POSITIVE RODS    Culture   Final    Consistent with normal respiratory flora. Performed at [facility], [place] [address] [place], [place] [address]    Report Status 11/20/2017 FINAL  Final     Labs: BNP (last 3 results) Recent Labs    11/17/17 1744  BNP 29.5   Basic Metabolic Panel: Recent Labs  Lab 11/17/17 1744 11/18/17 1052 11/19/17 0549 11/20/17 0543 11/21/17 0506  NA 138 129* 131* 130* 129*  K 4.3 4.5 4.2 3.8 3.7  CL 101 94* 92* 85* 84*  CO2 28 25 29  34* 34*  GLUCOSE 111* 251* 237* 162* 173*  BUN 19 19 31* 43* 52*  CREATININE 0.89 0.86 1.18* 1.26* 1.28*  CALCIUM 9.6 9.1 9.1 8.9 9.1  MG 1.7  --   --   --   --   PHOS 4.0  --   --  4.7* 4.5   Liver Function Tests: Recent Labs  Lab 11/20/17 0543 11/21/17 0506  ALBUMIN 3.4* 3.5   No results for input(s): LIPASE, AMYLASE in the last 168 hours. No results for input(s): AMMONIA in the last 168 hours. CBC: Recent Labs  Lab 11/17/17 1744 11/20/17 0543 11/21/17 0506  WBC 12.0* 12.3* 12.7*  NEUTROABS 9.6* 11.3* 11.5*  HGB 9.1* 9.9* 10.6*  HCT 29.0* 30.8* 33.2*  MCV 93.2 91.1 90.2  PLT 268 309 337   Cardiac Enzymes: Recent Labs  Lab  11/17/17 1744 11/17/17 2124  TROPONINI <0.03 <0.03   BNP: Invalid input(s): POCBNP CBG: Recent Labs  Lab 11/20/17 1621 11/20/17 2153 11/21/17 0727 11/21/17 1129 11/21/17 1626  GLUCAP 126* 107* 280* 70 135*   D-Dimer No results for input(s): DDIMER in the last 72 hours. Hgb A1c No results for input(s): HGBA1C in the last 72 hours. Lipid Profile No results for input(s): CHOL, HDL, LDLCALC, TRIG, CHOLHDL, LDLDIRECT in the last 72 hours. Thyroid function studies No results for input(s): TSH, T4TOTAL, T3FREE, THYROIDAB in the last 72 hours.  Invalid input(s): FREET3 Anemia work up No results for input(s): VITAMINB12, FOLATE, FERRITIN, TIBC, IRON, RETICCTPCT in the last 72 hours. Urinalysis    Component Value Date/Time   COLORURINE YELLOW 04/06/2015 1935   APPEARANCEUR CLEAR 04/06/2015 1935   LABSPEC 1.015 04/06/2015 1935   PHURINE 5.5 04/06/2015 1935   GLUCOSEU NEGATIVE 04/06/2015 1935   GLUCOSEU NEG mg/dL 11/21/2010 1308   HGBUR TRACE (A) 04/06/2015 1935   BILIRUBINUR NEGATIVE 04/06/2015 1935   KETONESUR NEGATIVE 04/06/2015 1935   PROTEINUR NEGATIVE 04/06/2015 1935   UROBILINOGEN 0.2 04/06/2015 1935   NITRITE NEGATIVE 04/06/2015 1935   LEUKOCYTESUR NEGATIVE 04/06/2015 1935   Sepsis Labs Invalid input(s): PROCALCITONIN,  WBC,  LACTICIDVEN Microbiology Recent Results (from the past 240 hour(s))  MRSA PCR Screening     Status: None   Collection Time: 11/17/17 11:00 PM  Result Value Ref Range Status   MRSA by PCR NEGATIVE NEGATIVE Final    Comment:        The GeneXpert MRSA Assay (FDA approved for NASAL specimens only), is one component of a comprehensive MRSA colonization surveillance program. It is not intended to diagnose MRSA infection nor to guide or monitor treatment for MRSA infections.  Culture, sputum-assessment     Status: None   Collection Time: 11/18/17  1:30 AM  Result Value Ref Range Status   Specimen Description SPUTUM  Final   Special  Requests NONE  Final   Sputum evaluation THIS SPECIMEN IS ACCEPTABLE FOR SPUTUM CULTURE  Final   Report Status 11/18/2017 FINAL  Final  Culture, respiratory (NON-Expectorated)     Status: None   Collection Time: 11/18/17  1:30 AM  Result Value Ref Range Status   Specimen Description SPUTUM  Final   Special Requests NONE Reflexed from [id number]  Final   Gram Stain   Final    RARE WBC PRESENT, PREDOMINANTLY MONONUCLEAR FEW SQUAMOUS EPITHELIAL CELLS PRESENT MODERATE GRAM POSITIVE COCCI IN PAIRS FEW GRAM POSITIVE RODS    Culture   Final    Consistent with normal respiratory flora. Performed at [facility], [place] [address], [place], [place] [address]    Report Status 11/20/2017 FINAL  Final     Time coordinating discharge: Over 30 minutes  SIGNED:   Kathie Dike, MD  [facility] 11/21/2017, 8:12 PM Pager   If 7PM-7AM, please contact night-coverage [email or web address] Password [email or web address]

## 2017-11-21 NOTE — Progress Notes (Signed)
Discharge instructions read to patient and her family.  Both verbalized understanding of all instructions. Discharged to home with family 

## 2017-11-21 NOTE — Care Management (Signed)
Patient elects [facility] for home health. She anticipates DC today. She is aware [facility] will have 48 hours to initiate services. She asks for "Colletta [place]" with [facility]. Will notify Vaughan Basta of [facility], she will obtain orders from Epic.

## 2017-12-03 ENCOUNTER — Ambulatory Visit: Payer: Medicare Other

## 2017-12-05 ENCOUNTER — Other Ambulatory Visit: Payer: Self-pay | Admitting: Gastroenterology

## 2018-01-01 ENCOUNTER — Ambulatory Visit: Payer: Medicare Other

## 2018-01-22 ENCOUNTER — Ambulatory Visit
Admission: RE | Admit: 2018-01-22 | Discharge: 2018-01-22 | Disposition: A | Discharge status: Home / Self Care | Payer: Medicare Other | Source: Ambulatory Visit | Attending: Obstetrics and Gynecology | Admitting: Obstetrics and Gynecology

## 2018-01-22 DIAGNOSIS — Z1231 Encounter for screening mammogram for malignant neoplasm of breast: Secondary | ICD-10-CM

## 2018-02-04 ENCOUNTER — Other Ambulatory Visit: Payer: Self-pay | Admitting: Gastroenterology

## 2018-02-18 ENCOUNTER — Emergency Department (HOSPITAL_COMMUNITY): Payer: Medicare Other

## 2018-02-18 ENCOUNTER — Inpatient Hospital Stay (HOSPITAL_COMMUNITY)
Admission: EM | Admit: 2018-02-18 | Discharge: 2018-02-20 | DRG: 291 | Disposition: A | Discharge status: Home Health | Payer: Medicare Other | Attending: Internal Medicine | Admitting: Internal Medicine

## 2018-02-18 ENCOUNTER — Encounter (HOSPITAL_COMMUNITY): Payer: Self-pay | Admitting: Emergency Medicine

## 2018-02-18 ENCOUNTER — Other Ambulatory Visit: Payer: Self-pay

## 2018-02-18 DIAGNOSIS — Z6841 Body Mass Index (BMI) 40.0 and over, adult: Secondary | ICD-10-CM | POA: Diagnosis not present

## 2018-02-18 DIAGNOSIS — I5033 Acute on chronic diastolic (congestive) heart failure: Secondary | ICD-10-CM | POA: Diagnosis present

## 2018-02-18 DIAGNOSIS — K589 Irritable bowel syndrome without diarrhea: Secondary | ICD-10-CM | POA: Diagnosis present

## 2018-02-18 DIAGNOSIS — F419 Anxiety disorder, unspecified: Secondary | ICD-10-CM | POA: Diagnosis present

## 2018-02-18 DIAGNOSIS — Z9842 Cataract extraction status, left eye: Secondary | ICD-10-CM

## 2018-02-18 DIAGNOSIS — K219 Gastro-esophageal reflux disease without esophagitis: Secondary | ICD-10-CM | POA: Diagnosis present

## 2018-02-18 DIAGNOSIS — Z961 Presence of intraocular lens: Secondary | ICD-10-CM | POA: Diagnosis present

## 2018-02-18 DIAGNOSIS — Z794 Long term (current) use of insulin: Secondary | ICD-10-CM

## 2018-02-18 DIAGNOSIS — Z853 Personal history of malignant neoplasm of breast: Secondary | ICD-10-CM

## 2018-02-18 DIAGNOSIS — Z7951 Long term (current) use of inhaled steroids: Secondary | ICD-10-CM | POA: Diagnosis not present

## 2018-02-18 DIAGNOSIS — K5730 Diverticulosis of large intestine without perforation or abscess without bleeding: Secondary | ICD-10-CM | POA: Diagnosis not present

## 2018-02-18 DIAGNOSIS — E785 Hyperlipidemia, unspecified: Secondary | ICD-10-CM | POA: Diagnosis present

## 2018-02-18 DIAGNOSIS — Z7952 Long term (current) use of systemic steroids: Secondary | ICD-10-CM | POA: Diagnosis not present

## 2018-02-18 DIAGNOSIS — I110 Hypertensive heart disease with heart failure: Secondary | ICD-10-CM | POA: Diagnosis not present

## 2018-02-18 DIAGNOSIS — I10 Essential (primary) hypertension: Secondary | ICD-10-CM | POA: Diagnosis present

## 2018-02-18 DIAGNOSIS — F329 Major depressive disorder, single episode, unspecified: Secondary | ICD-10-CM | POA: Diagnosis present

## 2018-02-18 DIAGNOSIS — J45901 Unspecified asthma with (acute) exacerbation: Secondary | ICD-10-CM | POA: Diagnosis present

## 2018-02-18 DIAGNOSIS — E119 Type 2 diabetes mellitus without complications: Secondary | ICD-10-CM | POA: Diagnosis present

## 2018-02-18 DIAGNOSIS — Z79899 Other long term (current) drug therapy: Secondary | ICD-10-CM

## 2018-02-18 DIAGNOSIS — Z7982 Long term (current) use of aspirin: Secondary | ICD-10-CM

## 2018-02-18 DIAGNOSIS — J441 Chronic obstructive pulmonary disease with (acute) exacerbation: Secondary | ICD-10-CM | POA: Diagnosis present

## 2018-02-18 DIAGNOSIS — Z9841 Cataract extraction status, right eye: Secondary | ICD-10-CM

## 2018-02-18 DIAGNOSIS — J9601 Acute respiratory failure with hypoxia: Secondary | ICD-10-CM | POA: Diagnosis present

## 2018-02-18 DIAGNOSIS — I350 Nonrheumatic aortic (valve) stenosis: Secondary | ICD-10-CM | POA: Diagnosis present

## 2018-02-18 DIAGNOSIS — J4551 Severe persistent asthma with (acute) exacerbation: Secondary | ICD-10-CM | POA: Diagnosis not present

## 2018-02-18 LAB — BASIC METABOLIC PANEL
Anion gap: 11 (ref 5–15)
BUN: 7 mg/dL (ref 6–20)
CALCIUM: 9.1 mg/dL (ref 8.9–10.3)
CO2: 26 mmol/L (ref 22–32)
CREATININE: 0.73 mg/dL (ref 0.44–1.00)
Chloride: 101 mmol/L (ref 101–111)
GLUCOSE: 152 mg/dL — AB (ref 65–99)
Potassium: 3.9 mmol/L (ref 3.5–5.1)
Sodium: 138 mmol/L (ref 135–145)

## 2018-02-18 LAB — CBC
HCT: 30.4 % — ABNORMAL LOW (ref 36.0–46.0)
Hemoglobin: 9.4 g/dL — ABNORMAL LOW (ref 12.0–15.0)
MCH: 28.7 pg (ref 26.0–34.0)
MCHC: 30.9 g/dL (ref 30.0–36.0)
MCV: 93 fL (ref 78.0–100.0)
PLATELETS: 230 10*3/uL (ref 150–400)
RBC: 3.27 MIL/uL — AB (ref 3.87–5.11)
RDW: 14.7 % (ref 11.5–15.5)
WBC: 10.1 10*3/uL (ref 4.0–10.5)

## 2018-02-18 LAB — INFLUENZA PANEL BY PCR (TYPE A & B)
Influenza A By PCR: NEGATIVE
Influenza B By PCR: NEGATIVE

## 2018-02-18 LAB — I-STAT TROPONIN, ED: TROPONIN I, POC: 0.01 ng/mL (ref 0.00–0.08)

## 2018-02-18 LAB — GLUCOSE, CAPILLARY: Glucose-Capillary: 398 mg/dL — ABNORMAL HIGH (ref 65–99)

## 2018-02-18 LAB — I-STAT CG4 LACTIC ACID, ED: Lactic Acid, Venous: 1.52 mmol/L (ref 0.5–1.9)

## 2018-02-18 LAB — BRAIN NATRIURETIC PEPTIDE: B Natriuretic Peptide: 65 pg/mL (ref 0.0–100.0)

## 2018-02-18 MED ORDER — IPRATROPIUM-ALBUTEROL 0.5-2.5 (3) MG/3ML IN SOLN
3.0000 mL | Freq: Four times a day (QID) | RESPIRATORY_TRACT | Status: DC
  Administered 2018-02-19 (×4): 3 mL via RESPIRATORY_TRACT
  Filled 2018-02-18 (×4): qty 3

## 2018-02-18 MED ORDER — HYDROCODONE-ACETAMINOPHEN 5-325 MG PO TABS
1.0000 | ORAL_TABLET | ORAL | Status: DC | PRN
  Administered 2018-02-18: 1 via ORAL
  Administered 2018-02-19: 2 via ORAL
  Filled 2018-02-18: qty 2
  Filled 2018-02-18: qty 1

## 2018-02-18 MED ORDER — FUROSEMIDE 20 MG PO TABS
20.0000 mg | ORAL_TABLET | Freq: Every day | ORAL | Status: DC
  Filled 2018-02-18: qty 1

## 2018-02-18 MED ORDER — ALBUTEROL (5 MG/ML) CONTINUOUS INHALATION SOLN
10.0000 mg/h | INHALATION_SOLUTION | RESPIRATORY_TRACT | Status: DC
  Administered 2018-02-18: 10 mg/h via RESPIRATORY_TRACT
  Filled 2018-02-18: qty 20

## 2018-02-18 MED ORDER — BENZONATATE 100 MG PO CAPS: 100.0000 mg | ORAL_CAPSULE | ORAL | Status: DC | PRN

## 2018-02-18 MED ORDER — LEVOFLOXACIN IN D5W 750 MG/150ML IV SOLN
750.0000 mg | INTRAVENOUS | Status: DC
  Administered 2018-02-18: 750 mg via INTRAVENOUS
  Filled 2018-02-18: qty 150

## 2018-02-18 MED ORDER — MONTELUKAST SODIUM 10 MG PO TABS
10.0000 mg | ORAL_TABLET | Freq: Every day | ORAL | Status: DC
  Administered 2018-02-18 – 2018-02-19 (×2): 10 mg via ORAL
  Filled 2018-02-18 (×2): qty 1

## 2018-02-18 MED ORDER — VITAMIN C 500 MG PO TABS
500.0000 mg | ORAL_TABLET | Freq: Every day | ORAL | Status: DC
  Administered 2018-02-19 – 2018-02-20 (×2): 500 mg via ORAL
  Filled 2018-02-18 (×2): qty 1

## 2018-02-18 MED ORDER — DOXYCYCLINE HYCLATE 100 MG PO TABS
100.0000 mg | ORAL_TABLET | Freq: Two times a day (BID) | ORAL | Status: DC
  Administered 2018-02-18 – 2018-02-20 (×4): 100 mg via ORAL
  Filled 2018-02-18 (×4): qty 1

## 2018-02-18 MED ORDER — ZOLPIDEM TARTRATE 5 MG PO TABS
5.0000 mg | ORAL_TABLET | Freq: Every evening | ORAL | Status: DC | PRN
  Administered 2018-02-18 – 2018-02-19 (×2): 5 mg via ORAL
  Filled 2018-02-18 (×2): qty 1

## 2018-02-18 MED ORDER — GUAIFENESIN ER 600 MG PO TB12
600.0000 mg | ORAL_TABLET | Freq: Two times a day (BID) | ORAL | Status: DC
  Administered 2018-02-18 – 2018-02-20 (×4): 600 mg via ORAL
  Filled 2018-02-18 (×4): qty 1

## 2018-02-18 MED ORDER — ALBUTEROL SULFATE (2.5 MG/3ML) 0.083% IN NEBU
5.0000 mg | INHALATION_SOLUTION | Freq: Once | RESPIRATORY_TRACT | Status: AC
  Administered 2018-02-18: 5 mg via RESPIRATORY_TRACT
  Filled 2018-02-18: qty 6

## 2018-02-18 MED ORDER — CARVEDILOL 3.125 MG PO TABS
3.1250 mg | ORAL_TABLET | Freq: Two times a day (BID) | ORAL | Status: DC
  Administered 2018-02-19 – 2018-02-20 (×3): 3.125 mg via ORAL
  Filled 2018-02-18 (×3): qty 1

## 2018-02-18 MED ORDER — INSULIN ASPART 100 UNIT/ML ~~LOC~~ SOLN
0.0000 [IU] | Freq: Every day | SUBCUTANEOUS | Status: DC
  Administered 2018-02-18: 5 [IU] via SUBCUTANEOUS

## 2018-02-18 MED ORDER — ASPIRIN EC 81 MG PO TBEC
81.0000 mg | DELAYED_RELEASE_TABLET | Freq: Every day | ORAL | Status: DC
  Administered 2018-02-19 – 2018-02-20 (×2): 81 mg via ORAL
  Filled 2018-02-18 (×2): qty 1

## 2018-02-18 MED ORDER — ALBUTEROL SULFATE (2.5 MG/3ML) 0.083% IN NEBU: 2.5000 mg | INHALATION_SOLUTION | Freq: Four times a day (QID) | RESPIRATORY_TRACT | Status: DC

## 2018-02-18 MED ORDER — METHYLPREDNISOLONE SODIUM SUCC 125 MG IJ SOLR
125.0000 mg | Freq: Once | INTRAMUSCULAR | Status: AC
  Administered 2018-02-18: 125 mg via INTRAVENOUS
  Filled 2018-02-18: qty 2

## 2018-02-18 MED ORDER — SERTRALINE HCL 50 MG PO TABS
50.0000 mg | ORAL_TABLET | Freq: Every day | ORAL | Status: DC
  Administered 2018-02-19 – 2018-02-20 (×2): 50 mg via ORAL
  Filled 2018-02-18 (×2): qty 1

## 2018-02-18 MED ORDER — BACLOFEN 10 MG PO TABS: 5.0000 mg | ORAL_TABLET | Freq: Every day | ORAL | Status: DC | PRN

## 2018-02-18 MED ORDER — IPRATROPIUM BROMIDE 0.02 % IN SOLN: 0.5000 mg | Freq: Four times a day (QID) | RESPIRATORY_TRACT | Status: DC

## 2018-02-18 MED ORDER — DICYCLOMINE HCL 10 MG PO CAPS
10.0000 mg | ORAL_CAPSULE | Freq: Three times a day (TID) | ORAL | Status: DC
  Administered 2018-02-19 – 2018-02-20 (×5): 10 mg via ORAL
  Filled 2018-02-18 (×5): qty 1

## 2018-02-18 MED ORDER — IRBESARTAN 75 MG PO TABS
37.5000 mg | ORAL_TABLET | Freq: Every day | ORAL | Status: DC
  Administered 2018-02-19 – 2018-02-20 (×2): 37.5 mg via ORAL
  Filled 2018-02-18 (×2): qty 1

## 2018-02-18 MED ORDER — INSULIN GLARGINE 100 UNIT/ML ~~LOC~~ SOLN
26.0000 [IU] | Freq: Every morning | SUBCUTANEOUS | Status: DC
  Administered 2018-02-19 – 2018-02-20 (×2): 26 [IU] via SUBCUTANEOUS
  Filled 2018-02-18 (×3): qty 0.26

## 2018-02-18 MED ORDER — PANTOPRAZOLE SODIUM 40 MG PO TBEC
40.0000 mg | DELAYED_RELEASE_TABLET | Freq: Every day | ORAL | Status: DC
  Administered 2018-02-19 – 2018-02-20 (×2): 40 mg via ORAL
  Filled 2018-02-18 (×2): qty 1

## 2018-02-18 MED ORDER — SODIUM CHLORIDE 0.9 % IV SOLN
100.0000 mg | Freq: Once | INTRAVENOUS | Status: DC
  Filled 2018-02-18: qty 100

## 2018-02-18 MED ORDER — ACETAMINOPHEN 650 MG RE SUPP: 650.0000 mg | Freq: Four times a day (QID) | RECTAL | Status: DC | PRN

## 2018-02-18 MED ORDER — ONDANSETRON HCL 4 MG PO TABS: 4.0000 mg | ORAL_TABLET | Freq: Four times a day (QID) | ORAL | Status: DC | PRN

## 2018-02-18 MED ORDER — INSULIN ASPART 100 UNIT/ML ~~LOC~~ SOLN
0.0000 [IU] | Freq: Three times a day (TID) | SUBCUTANEOUS | Status: DC
  Administered 2018-02-19: 7 [IU] via SUBCUTANEOUS

## 2018-02-18 MED ORDER — SODIUM CHLORIDE 0.9 % IV SOLN: 250.0000 mL | INTRAVENOUS | Status: DC | PRN

## 2018-02-18 MED ORDER — ATORVASTATIN CALCIUM 40 MG PO TABS
40.0000 mg | ORAL_TABLET | Freq: Every day | ORAL | Status: DC
  Administered 2018-02-18 – 2018-02-19 (×2): 40 mg via ORAL
  Filled 2018-02-18 (×2): qty 1

## 2018-02-18 MED ORDER — SODIUM CHLORIDE 0.9% FLUSH: 3.0000 mL | INTRAVENOUS | Status: DC | PRN

## 2018-02-18 MED ORDER — MAGNESIUM OXIDE 400 (241.3 MG) MG PO TABS
400.0000 mg | ORAL_TABLET | Freq: Every day | ORAL | Status: DC
  Administered 2018-02-19 – 2018-02-20 (×2): 400 mg via ORAL
  Filled 2018-02-18 (×3): qty 1

## 2018-02-18 MED ORDER — ONDANSETRON HCL 4 MG/2ML IJ SOLN: 4.0000 mg | Freq: Four times a day (QID) | INTRAMUSCULAR | Status: DC | PRN

## 2018-02-18 MED ORDER — EXENATIDE 10 MCG/0.04ML ~~LOC~~ SOPN
10.0000 ug | PEN_INJECTOR | Freq: Two times a day (BID) | SUBCUTANEOUS | Status: DC
  Filled 2018-02-18: qty 2.4

## 2018-02-18 MED ORDER — PREDNISONE 20 MG PO TABS: ORAL_TABLET | ORAL | 0 refills | Status: DC

## 2018-02-18 MED ORDER — ENOXAPARIN SODIUM 60 MG/0.6ML ~~LOC~~ SOLN
55.0000 mg | SUBCUTANEOUS | Status: DC
  Administered 2018-02-18 – 2018-02-19 (×2): 55 mg via SUBCUTANEOUS
  Filled 2018-02-18 (×2): qty 0.6

## 2018-02-18 MED ORDER — ACETAMINOPHEN 325 MG PO TABS: 650.0000 mg | ORAL_TABLET | Freq: Four times a day (QID) | ORAL | Status: DC | PRN

## 2018-02-18 MED ORDER — SODIUM CHLORIDE 0.9% FLUSH
3.0000 mL | Freq: Two times a day (BID) | INTRAVENOUS | Status: DC
  Administered 2018-02-18 – 2018-02-20 (×4): 3 mL via INTRAVENOUS

## 2018-02-18 MED ORDER — MOMETASONE FURO-FORMOTEROL FUM 100-5 MCG/ACT IN AERO
2.0000 | INHALATION_SPRAY | Freq: Two times a day (BID) | RESPIRATORY_TRACT | Status: DC
  Administered 2018-02-19: 2 via RESPIRATORY_TRACT
  Filled 2018-02-18 (×2): qty 8.8

## 2018-02-18 MED ORDER — LORAZEPAM 1 MG PO TABS: 1.0000 mg | ORAL_TABLET | Freq: Two times a day (BID) | ORAL | Status: DC | PRN

## 2018-02-18 MED ORDER — METHYLPREDNISOLONE SODIUM SUCC 125 MG IJ SOLR
60.0000 mg | Freq: Four times a day (QID) | INTRAMUSCULAR | Status: DC
  Administered 2018-02-18 – 2018-02-19 (×2): 60 mg via INTRAVENOUS
  Filled 2018-02-18 (×3): qty 2

## 2018-02-18 MED ORDER — ALBUTEROL SULFATE (2.5 MG/3ML) 0.083% IN NEBU: 2.5000 mg | INHALATION_SOLUTION | RESPIRATORY_TRACT | Status: DC | PRN

## 2018-02-18 MED ORDER — AMLODIPINE BESYLATE 5 MG PO TABS
5.0000 mg | ORAL_TABLET | Freq: Every day | ORAL | Status: DC
  Administered 2018-02-19 – 2018-02-20 (×2): 5 mg via ORAL
  Filled 2018-02-18 (×2): qty 1

## 2018-02-18 NOTE — ED Notes (Signed)
RT consulted concerning albuterol neb treatment.

## 2018-02-18 NOTE — ED Provider Notes (Addendum)
[facility] EMERGENCY DEPARTMENT Provider Note   CSN: [id number] Arrival date & time: 02/18/18  3710     History   Chief Complaint Chief Complaint  Patient presents with  . Chest Pain    HPI Ann Fuller is a 72 y.o. female.  HPI Patient with history of asthma presents with worsening shortness of breath, cough productive of yellow sputum, central chest pain with breathing for the past 2 days.  States that since November she is been hospitalized several times for pneumonia.  Her primary physician just restarted her doxycycline and prednisone.  Denies any new lower extremity swelling or pain. Past Medical History:  Diagnosis Date  . Adenomatous colon polyp   . Arthritis   . Asthma   . Breast cancer ([place]) [id number]  . Bulging lumbar disc   . Depression   . Diverticulitis   . Dyspepsia   . Essential hypertension, benign   . GERD (gastroesophageal reflux disease)   . Heart murmur   . Hyperlipidemia   . IBS (irritable bowel syndrome)   . Internal hemorrhoid   . Right shoulder pain   . Tubular adenoma 11/2010  . Type 2 diabetes mellitus [facility])     Patient Active Problem List   Diagnosis Date Noted  . Bulging lumbar disc 11/18/2017  . Acute asthma exacerbation 11/17/2017  . Lactic acidosis 11/17/2017  . Abdominal pain, LLQ 01/22/2017  . LUQ abdominal pain 01/22/2017  . Normocytic anemia 01/22/2017  . Cough 09/10/2016  . Dilation of biliary tract 09/10/2016  . Breast pain, right 08/06/2016  . CAP (community acquired pneumonia) 04/29/2016  . History of adenomatous polyp of colon   . Diverticulosis of colon without hemorrhage   . Adenomatous polyp of colon 10/17/2015  . Upper airway cough syndrome 10/10/2015  . Morbid obesity ([place]) 10/10/2015  . Diastolic dysfunction 62/69/4854  . Insomnia 04/06/2015  . Cough variant asthma 01/22/2015  . IBS (irritable bowel syndrome) 08/25/2014  . Syncope 03/08/2014  . Murmur, cardiac 03/08/2014  . Type 2 diabetes mellitus  ([place]) 03/05/2014  . Hyperlipidemia 03/05/2014  . Essential hypertension, benign 03/05/2014  . GERD (gastroesophageal reflux disease) 11/23/2011  . Diarrhea 01/25/2011  . CARCINOMA, BREAST, HX OF 11/21/2010    Past Surgical History:  Procedure Laterality Date  . ABDOMINAL HYSTERECTOMY    . APPENDECTOMY    . BACK SURGERY  2007   Lumbar fusion  . CATARACT EXTRACTION W/PHACO Left 08/27/2013   Procedure: CATARACT EXTRACTION PHACO AND INTRAOCULAR LENS PLACEMENT (IOC);  Surgeon: Tonny Branch, MD;  Location: [facility];  Service: Ophthalmology;  Laterality: Left;  CDE 4.25  . CATARACT EXTRACTION W/PHACO Right 09/24/2013   Procedure: CATARACT EXTRACTION PHACO AND INTRAOCULAR LENS PLACEMENT (IOC);  Surgeon: Tonny Branch, MD;  Location: [facility];  Service: Ophthalmology;  Laterality: Right;  CDE:  8.16  . CHOLECYSTECTOMY    . COLONOSCOPY  12/13/2010   Dr. Margarito Courser pancolonic diverticular.tubular adenoma  . COLONOSCOPY  07/13/2003  . COLONOSCOPY N/A 10/28/2015   [id number] diverticulosis, multiple tubular adenomas removed. next tcs 10/2020.   . ESOPHAGOGASTRODUODENOSCOPY  12/13/2010   Dr. Jennet Maduro hernia, fundal gland type polyps  . EYE SURGERY Left    Left KPE 08/27/13  . MASTECTOMY Left 1995  . NASAL ENDOSCOPY WITH EPISTAXIS CONTROL Left 07/16/2016   Procedure: ENDOSCOPIC LEFT NASAL CAUTERY;  Surgeon: Leta Baptist, MD;  Location: [place];  Service: ENT;  Laterality: Left;  . TONSILLECTOMY    . TUBAL LIGATION  OB History    No data available       Home Medications    Prior to Admission medications   Medication Sig Start Date End Date Taking? Authorizing Provider  albuterol (PROVENTIL HFA;VENTOLIN HFA) 108 (90 Base) MCG/ACT inhaler Inhale 2 puffs into the lungs every 4 (four) hours as needed for wheezing or shortness of breath.   Yes [provider]  amLODipine (NORVASC) 5 MG tablet Take 5 mg by mouth daily.   Yes [provider]  aspirin EC 81 MG  tablet Take 81 mg by mouth daily.   Yes [provider]  atorvastatin (LIPITOR) 40 MG tablet Take 40 mg by mouth at bedtime.    Yes [provider]  baclofen (LIORESAL) 10 MG tablet Take 5 mg by mouth daily as needed for muscle spasms.    Yes [provider]  benzonatate (TESSALON) 200 MG capsule TAKE ONE CAPSULE BY MOUTH EVERY 6 HOURS FOR COUGH Patient taking differently: TAKE ONE CAPSULE BY MOUTH EVERY 6 HOURS FOR COUGH. Takes as needed 08/28/16  Yes Tanda Rockers, MD  budesonide-formoterol (SYMBICORT) 80-4.5 MCG/ACT inhaler Inhale 2 puffs into the lungs 2 (two) times daily. 08/31/16  Yes Tanda Rockers, MD  carvedilol (COREG) 3.125 MG tablet Take 1 tablet (3.125 mg total) by mouth 2 (two) times daily with a meal. 11/22/17  Yes Memon, Jolaine Artist, MD  chlorpheniramine (CHLOR-TRIMETON) 4 MG tablet Take 4 mg by mouth every 4 (four) hours as needed (drippy nose, drainage, throat clearing).    Yes [provider]  dicyclomine (BENTYL) 10 MG capsule TAKE ONE CAPSULE BY MOUTH PRIOR TO MEALSAS NEEDED. NO MORE THAN FOUR TIMES DAILY. MAY CAUSE DROWINESS 12/05/17  Yes Annitta Needs, NP  doxazosin (CARDURA) 2 MG tablet Take 4 mg by mouth every morning.    Yes [provider]  doxycycline (VIBRA-TABS) 100 MG tablet Take 1 tablet by mouth 2 (two) times daily. 02/17/18  Yes [provider]  exenatide (BYETTA 10 MCG PEN) 10 MCG/0.04ML SOLN Inject 10 mcg into the skin 2 (two) times daily with a meal.    Yes [provider]  furosemide (LASIX) 20 MG tablet Take 1 tablet (20 mg total) by mouth daily. 11/22/17  Yes Kathie Dike, MD  guaiFENesin (MUCINEX) 600 MG 12 hr tablet Take 1 tablet (600 mg total) by mouth 2 (two) times daily. 11/21/17  Yes Kathie Dike, MD  Insulin Glargine (LANTUS) 100 UNIT/ML Solostar Pen Inject 26 Units into the skin every morning.    Yes [provider]  Insulin Lispro (HUMALOG KWIKPEN [place]) Inject 6 Units into the skin 2  (two) times daily.    Yes [provider]  ipratropium-albuterol (DUONEB) 0.5-2.5 (3) MG/3ML SOLN Take 3 mLs by nebulization every 4 (four) hours. Patient taking differently: Take 3 mLs by nebulization every 6 (six) hours as needed (wheezing and shortness of breath).  04/10/15  Yes Asencion Noble, MD  LORazepam (ATIVAN) 1 MG tablet Take 1 mg by mouth 2 (two) times daily as needed. Anxiety/Sleep   Yes [provider]  magnesium oxide (MAG-OX) 400 MG tablet Take 400 mg by mouth daily.   Yes [provider]  montelukast (SINGULAIR) 10 MG tablet TAKE ONE TABLET BY MOUTH EVERY NIGHT AT BEDTIME 11/20/16  Yes Tanda Rockers, MD  olmesartan (BENICAR) 40 MG tablet Take 40 mg by mouth daily.    Yes [provider]  pantoprazole (PROTONIX) 40 MG tablet TAKE ONE TABLET TWICE DAILY BEFORE  A MEAL 02/05/18  Yes Annitta Needs, NP  sertraline (ZOLOFT) 50 MG tablet Take 50 mg by mouth.   Yes [provider]  Spacer/Aero-Holding Chambers (AEROCHAMBER MV) inhaler Use as instructed 08/31/16  Yes Tanda Rockers, MD  temazepam (RESTORIL) 15 MG capsule Take 15 mg by mouth at bedtime as needed for sleep.   Yes [provider]  vitamin C (ASCORBIC ACID) 500 MG tablet Take 500 mg by mouth daily as needed.    Yes [provider]  levofloxacin (LEVAQUIN) 750 MG tablet Take 1 tablet (750 mg total) by mouth daily. Patient not taking: Reported on 02/18/2018 11/22/17   Kathie Dike, MD  predniSONE (DELTASONE) 20 MG tablet 3 tabs po day one, then 2 po daily x 4 days 02/19/18   Julianne Rice, MD    Family History Family History  Problem Relation Age of Onset  . Colon cancer Mother   . Heart attack Father   . Heart attack Sister   . Stroke Sister   . Cancer - Lung Sister     Social History Social History   Tobacco Use  . Smoking status: Never Smoker  . Smokeless tobacco: Never Used  Substance Use Topics  . Alcohol use: No  . Drug use: No     Allergies     Ceftin [cefuroxime axetil]; Codeine; Metformin and related; Nexium [esomeprazole magnesium]; Omnicef [cefdinir]; and Sulfa antibiotics   Review of Systems Review of Systems  Constitutional: Positive for chills, fatigue and fever.  HENT: Negative for congestion, sore throat and trouble swallowing.   Eyes: Negative for visual disturbance.  Respiratory: Positive for cough, chest tightness, shortness of breath and wheezing.   Cardiovascular: Positive for chest pain. Negative for palpitations and leg swelling.  Gastrointestinal: Negative for abdominal pain, constipation, diarrhea, nausea and vomiting.  Musculoskeletal: Negative for back pain, myalgias, neck pain and neck stiffness.  Skin: Negative for rash and wound.  Neurological: Negative for dizziness, syncope, weakness, numbness and headaches.  All other systems reviewed and are negative.    Physical Exam Updated Vital Signs BP (!) 163/66 (BP Location: Right Arm)   Pulse (!) 118   Temp 99.5 F (37.5 C) (Oral)   Resp (!) 30   Ht 4\' 11"  (1.499 m)   Wt 107.5 kg (237 lb)   SpO2 (!) 88%   BMI 47.87 kg/m   Physical Exam  Constitutional: She is oriented to person, place, and time. She appears well-developed and well-nourished. She appears distressed.  HENT:  Head: Normocephalic and atraumatic.  Mouth/Throat: Oropharynx is clear and moist. No oropharyngeal exudate.  Eyes: EOM are normal. Pupils are equal, round, and reactive to light.  Neck: Normal range of motion. Neck supple. No JVD present.  Cardiovascular: Normal rate and regular rhythm. Exam reveals no gallop and no friction rub.  No murmur heard. Pulmonary/Chest: She has wheezes.  Increased work of breathing.  Diminished air movement throughout.  Expiratory wheezes.  Abdominal: Soft. Bowel sounds are normal. There is no tenderness. There is no rebound and no guarding.  Musculoskeletal: Normal range of motion. She exhibits no edema or tenderness.  No lower extremity  swelling, asymmetry or tenderness.  Neurological: She is alert and oriented to person, place, and time.  Skin: Skin is warm and dry. No rash noted. She is not diaphoretic. No erythema.  Psychiatric: She has a normal mood and affect. Her behavior is normal.  Nursing note and vitals reviewed.    ED Treatments / Results  Labs (  all labs ordered are listed, but only abnormal results are displayed) Labs Reviewed  BASIC METABOLIC PANEL - Abnormal; Notable for the following components:      Result Value   Glucose, Bld 152 (*)    All other components within normal limits  CBC - Abnormal; Notable for the following components:   RBC 3.27 (*)    Hemoglobin 9.4 (*)    HCT 30.4 (*)    All other components within normal limits  CULTURE, BLOOD (ROUTINE X 2)  CULTURE, BLOOD (ROUTINE X 2)  BRAIN NATRIURETIC PEPTIDE  INFLUENZA PANEL BY PCR (TYPE A & B)  I-STAT TROPONIN, ED  I-STAT CG4 LACTIC ACID, ED    EKG  EKG Interpretation  Date/Time:  Tuesday February 18 2018 10:02:27 EST Ventricular Rate:  103 PR Interval:  152 QRS Duration: 76 QT Interval:  338 QTC Calculation: 442 R Axis:   22 Text Interpretation:  Sinus tachycardia Otherwise normal ECG Confirmed by Julianne Rice [phone]) on 02/18/2018 1:18:43 PM       Radiology Dg Chest 2 View  Result Date: 02/18/2018 CLINICAL DATA:  Cough and congestion.  Shortness of breath. EXAM: CHEST  2 VIEW COMPARISON:  CT 11/19/2017. Chest x-ray 11/17/2017, 01/05/2017, 08/29/2016, 05/25/2016. FINDINGS: Mediastinum hilar structures normal. Stable diffuse bilateral interstitial prominence noted consistent chronic interstitial lung disease. No pleural effusion or pneumothorax. Left mastectomy. Surgical clips left axilla. IMPRESSION: Low lung volumes. Diffuse bilateral interstitial prominence noted consistent with chronic interstitial lung disease. No change from prior exam. No acute abnormality identified. Electronically Signed   By: Marcello Moores  Register   On:  02/18/2018 12:25    Procedures Procedures (including critical care time)  Medications Ordered in ED Medications  albuterol (PROVENTIL,VENTOLIN) solution continuous neb (10 mg/hr Nebulization New Bag/Given 02/18/18 1412)  doxycycline (VIBRAMYCIN) 100 mg in sodium chloride 0.9 % 250 mL IVPB (not administered)  methylPREDNISolone sodium succinate (SOLU-MEDROL) 125 mg/2 mL injection 125 mg (125 mg Intravenous Given 02/18/18 1112)  albuterol (PROVENTIL) (2.5 MG/3ML) 0.083% nebulizer solution 5 mg (5 mg Nebulization Given 02/18/18 1139)     Initial Impression / Assessment and Plan / ED Course  I have reviewed the triage vital signs and the nursing notes.  Pertinent labs & imaging results that were available during my care of the patient were reviewed by me and considered in my medical decision making (see chart for details).    Patient states she is feeling better after initial breathing treatment.  Will give continuous nebulized treatment and reevaluate for need for admission.  No evidence of pneumonia on x-ray.  Symptoms have been present for 2 days.  Negative troponin is sufficient to rule out MI.  Patient states he is feeling much better after continuous nebulized treatment.  Maintaining saturations in the high 90s on room air.  States she would like to try going home.  She is encouraged to continue her doxycycline and will give short course of prednisone.  Strict return precautions have been given.  Patient has voiced understanding. Final Clinical Impressions(s) / ED Diagnoses   Final diagnoses:  Exacerbation of persistent asthma, unspecified asthma severity    ED Discharge Orders        Ordered    predniSONE (DELTASONE) 20 MG tablet     02/18/18 1501       Julianne Rice, MD 02/18/18 1504   Attempting to discharge.  Patient became hypoxic into the 80s.  Increasingly short of breath.  Will discuss with hospitalist regarding admission.   Julianne Rice,  MD 02/18/18 1548

## 2018-02-18 NOTE — ED Notes (Signed)
MD at bedside. 

## 2018-02-18 NOTE — Discharge Instructions (Signed)
Continue doxycycline as previously prescribed.  Follow-up closely with your primary physician.  Return immediately for any worsening of your symptoms or concerns.

## 2018-02-18 NOTE — H&P (Signed)
[facility]                                                                                    Patient Demographics  Ann Fuller, is a 72 y.o. female  CSN: [id number]  MRN: [id number]  DOB - 1946-10-24  Admit Date - 02/18/2018  Outpatient Primary MD for the patient is Asencion Noble, MD   With History of -  Past Medical History:  Diagnosis Date  . Adenomatous colon polyp   . Arthritis   . Asthma   . Breast cancer ([place]) [id number]  . Bulging lumbar disc   . Depression   . Diverticulitis   . Dyspepsia   . Essential hypertension, benign   . GERD (gastroesophageal reflux disease)   . Heart murmur   . Hyperlipidemia   . IBS (irritable bowel syndrome)   . Internal hemorrhoid   . Right shoulder pain   . Tubular adenoma 11/2010  . Type 2 diabetes mellitus ([place])       Past Surgical History:  Procedure Laterality Date  . ABDOMINAL HYSTERECTOMY    . APPENDECTOMY    . BACK SURGERY  2007   Lumbar fusion  . CATARACT EXTRACTION W/PHACO Left 08/27/2013   Procedure: CATARACT EXTRACTION PHACO AND INTRAOCULAR LENS PLACEMENT (IOC);  Surgeon: Tonny Branch, MD;  Location: [facility];  Service: Ophthalmology;  Laterality: Left;  CDE 4.25  . CATARACT EXTRACTION W/PHACO Right 09/24/2013   Procedure: CATARACT EXTRACTION PHACO AND INTRAOCULAR LENS PLACEMENT (IOC);  Surgeon: Tonny Branch, MD;  Location: [facility];  Service: Ophthalmology;  Laterality: Right;  CDE:  8.16  . CHOLECYSTECTOMY    . COLONOSCOPY  12/13/2010   Dr. Margarito Courser pancolonic diverticular.tubular adenoma  . COLONOSCOPY  07/13/2003  . COLONOSCOPY N/A 10/28/2015   XNA:TFTDDUK diverticulosis, multiple tubular adenomas removed. next tcs 10/2020.   . ESOPHAGOGASTRODUODENOSCOPY  12/13/2010   Dr. Jennet Maduro hernia, fundal gland type polyps  . EYE SURGERY Left    Left KPE 08/27/13  . MASTECTOMY Left 1995  . NASAL ENDOSCOPY WITH EPISTAXIS CONTROL Left 07/16/2016   Procedure: ENDOSCOPIC LEFT NASAL CAUTERY;  Surgeon: Leta Baptist, MD;  Location: [facility];  Service: ENT;  Laterality: Left;  . TONSILLECTOMY    . TUBAL LIGATION      in for   Chief Complaint  Patient presents with  . Chest Pain     HPI  Ann Fuller  is a 72 y.o. female, with past medical history significant for asthma, breast cancer and colonic polyps presenting today with recurrent exacerbation of her asthma with shortness of breath, cough and sputum production.    Review of Systems    In addition to the HPI above,  No Fever-chills, No Headache, No changes with Vision or hearing, No problems swallowing food or Liquids, No Chest pain,  No Abdominal pain, No Nausea or Vommitting, Bowel movements are regular, No Blood in stool or Urine, No dysuria, No new skin rashes or bruises, No new joints pains-aches,  No new weakness, tingling, numbness in any extremity, No recent weight gain or loss, No polyuria, polydypsia or polyphagia, No significant Mental Stressors.  A full 10 point Review  of Systems was done, except as stated above, all other Review of Systems were negative.   Social History Social History   Tobacco Use  . Smoking status: Never Smoker  . Smokeless tobacco: Never Used  Substance Use Topics  . Alcohol use: No     Family History Family History  Problem Relation Age of Onset  . Colon cancer Mother   . Heart attack Father   . Heart attack Sister   . Stroke Sister   . Cancer - Lung Sister      Prior to Admission medications   Medication Sig Start Date End Date Taking? Authorizing Provider  albuterol (PROVENTIL HFA;VENTOLIN HFA) 108 (90 Base) MCG/ACT inhaler Inhale 2 puffs into the lungs every 4 (four) hours as needed for wheezing or shortness of breath.   Yes [provider]  amLODipine (NORVASC) 5 MG tablet Take 5 mg by mouth daily.   Yes [provider]  aspirin EC 81 MG tablet Take 81 mg by mouth daily.   Yes [provider]  atorvastatin (LIPITOR) 40 MG  tablet Take 40 mg by mouth at bedtime.    Yes [provider]  baclofen (LIORESAL) 10 MG tablet Take 5 mg by mouth daily as needed for muscle spasms.    Yes [provider]  benzonatate (TESSALON) 200 MG capsule TAKE ONE CAPSULE BY MOUTH EVERY 6 HOURS FOR COUGH Patient taking differently: TAKE ONE CAPSULE BY MOUTH EVERY 6 HOURS FOR COUGH. Takes as needed 08/28/16  Yes Tanda Rockers, MD  budesonide-formoterol (SYMBICORT) 80-4.5 MCG/ACT inhaler Inhale 2 puffs into the lungs 2 (two) times daily. 08/31/16  Yes Tanda Rockers, MD  carvedilol (COREG) 3.125 MG tablet Take 1 tablet (3.125 mg total) by mouth 2 (two) times daily with a meal. 11/22/17  Yes Memon, Jolaine Artist, MD  chlorpheniramine (CHLOR-TRIMETON) 4 MG tablet Take 4 mg by mouth every 4 (four) hours as needed (drippy nose, drainage, throat clearing).    Yes [provider]  dicyclomine (BENTYL) 10 MG capsule TAKE ONE CAPSULE BY MOUTH PRIOR TO MEALSAS NEEDED. NO MORE THAN FOUR TIMES DAILY. MAY CAUSE DROWINESS 12/05/17  Yes Annitta Needs, NP  doxazosin (CARDURA) 2 MG tablet Take 4 mg by mouth every morning.    Yes [provider]  doxycycline (VIBRA-TABS) 100 MG tablet Take 1 tablet by mouth 2 (two) times daily. 02/17/18  Yes [provider]  exenatide (BYETTA 10 MCG PEN) 10 MCG/0.04ML SOLN Inject 10 mcg into the skin 2 (two) times daily with a meal.    Yes [provider]  furosemide (LASIX) 20 MG tablet Take 1 tablet (20 mg total) by mouth daily. 11/22/17  Yes Kathie Dike, MD  guaiFENesin (MUCINEX) 600 MG 12 hr tablet Take 1 tablet (600 mg total) by mouth 2 (two) times daily. 11/21/17  Yes Kathie Dike, MD  Insulin Glargine (LANTUS) 100 UNIT/ML Solostar Pen Inject 26 Units into the skin every morning.    Yes [provider]  Insulin Lispro (HUMALOG KWIKPEN [place]) Inject 6 Units into the skin 2 (two) times daily.    Yes [provider]  ipratropium-albuterol (DUONEB) 0.5-2.5 (3)  MG/3ML SOLN Take 3 mLs by nebulization every 4 (four) hours. Patient taking differently: Take 3 mLs by nebulization every 6 (six) hours as needed (wheezing and shortness of breath).  04/10/15  Yes Asencion Noble, MD  LORazepam (ATIVAN) 1 MG tablet Take 1 mg by mouth 2 (two) times daily as needed. Anxiety/Sleep   Yes  [provider]  magnesium oxide (MAG-OX) 400 MG tablet Take 400 mg by mouth daily.   Yes [provider]  montelukast (SINGULAIR) 10 MG tablet TAKE ONE TABLET BY MOUTH EVERY NIGHT AT BEDTIME 11/20/16  Yes Tanda Rockers, MD  olmesartan (BENICAR) 40 MG tablet Take 40 mg by mouth daily.    Yes [provider]  pantoprazole (PROTONIX) 40 MG tablet TAKE ONE TABLET TWICE DAILY BEFORE A MEAL 02/05/18  Yes Annitta Needs, NP  sertraline (ZOLOFT) 50 MG tablet Take 50 mg by mouth.   Yes [provider]  Spacer/Aero-Holding Chambers (AEROCHAMBER MV) inhaler Use as instructed 08/31/16  Yes Tanda Rockers, MD  temazepam (RESTORIL) 15 MG capsule Take 15 mg by mouth at bedtime as needed for sleep.   Yes [provider]  vitamin C (ASCORBIC ACID) 500 MG tablet Take 500 mg by mouth daily as needed.    Yes [provider]  levofloxacin (LEVAQUIN) 750 MG tablet Take 1 tablet (750 mg total) by mouth daily. Patient not taking: Reported on 02/18/2018 11/22/17   Kathie Dike, MD  predniSONE (DELTASONE) 20 MG tablet 3 tabs po day one, then 2 po daily x 4 days 02/19/18   Julianne Rice, MD    Allergies  Allergen Reactions  . Ceftin [Cefuroxime Axetil] Swelling  . Codeine Swelling    Tongue swells  . Metformin And Related Diarrhea  . Nexium [Esomeprazole Magnesium] Diarrhea  . Omnicef [Cefdinir] Diarrhea  . Sulfa Antibiotics Rash    Physical Exam  Vitals  Blood pressure (!) 163/66, pulse (!) 118, temperature 99.5 F (37.5 C), temperature source Oral, resp. rate (!) 30, height 4\' 11"  (1.499 m), weight 107.5 kg (237 lb), SpO2 (!) 88 %.   1.  General well-developed, well-nourished female, very pleasant in moderate shortness of breath  2. Normal affect and insight, Not Suicidal or Homicidal, Awake Alert, Oriented X 3.  3. No F.N deficits, grossly, patient moving all extremities.  4. Ears and Eyes appear Normal, Conjunctivae clear, PERRLA. Moist Oral Mucosa.  5. Supple Neck, No JVD, No cervical lymphadenopathy appriciated, No Carotid Bruits.  6. Symmetrical Chest wall movement, poor air entry, no wheezing or rhonchi  7. RRR, No Gallops, Rubs or Murmurs, No Parasternal Heave.  8. Positive Bowel Sounds, Abdomen Soft, Non tender, No organomegaly appriciated,No rebound -guarding or rigidity.  9.  No Cyanosis, Normal Skin Turgor, No Skin Rash or Bruise.  10. Good muscle tone,  joints appear normal , no effusions, Normal ROM.    Data Review  CBC Recent Labs  Lab 02/18/18 1008  WBC 10.1  HGB 9.4*  HCT 30.4*  PLT 230  MCV 93.0  MCH 28.7  MCHC 30.9  RDW 14.7   ------------------------------------------------------------------------------------------------------------------  Chemistries  Recent Labs  Lab 02/18/18 1008  NA 138  K 3.9  CL 101  CO2 26  GLUCOSE 152*  BUN 7  CREATININE 0.73  CALCIUM 9.1   ------------------------------------------------------------------------------------------------------------------ estimated creatinine clearance is 72.2 mL/min (by C-G formula based on SCr of 0.73 mg/dL). ------------------------------------------------------------------------------------------------------------------ No results for input(s): TSH, T4TOTAL, T3FREE, THYROIDAB in the last 72 hours.  Invalid input(s): FREET3   Coagulation profile No results for input(s): INR, PROTIME in the last 168 hours. ------------------------------------------------------------------------------------------------------------------- No results for input(s): DDIMER in the last 72  hours. -------------------------------------------------------------------------------------------------------------------  Cardiac Enzymes No results for input(s): CKMB, TROPONINI, MYOGLOBIN in the last 168 hours.  Invalid input(s): CK ------------------------------------------------------------------------------------------------------------------ Invalid input(s): POCBNP   ---------------------------------------------------------------------------------------------------------------  Urinalysis  Component Value Date/Time   COLORURINE YELLOW 04/06/2015 1935   APPEARANCEUR CLEAR 04/06/2015 1935   LABSPEC 1.015 04/06/2015 1935   PHURINE 5.5 04/06/2015 1935   GLUCOSEU NEGATIVE 04/06/2015 1935   GLUCOSEU NEG mg/dL 11/21/2010 1308   HGBUR TRACE (A) 04/06/2015 1935   BILIRUBINUR NEGATIVE 04/06/2015 1935   KETONESUR NEGATIVE 04/06/2015 1935   PROTEINUR NEGATIVE 04/06/2015 1935   UROBILINOGEN 0.2 04/06/2015 1935   NITRITE NEGATIVE 04/06/2015 1935   LEUKOCYTESUR NEGATIVE 04/06/2015 1935    ----------------------------------------------------------------------------------------------------------------   Imaging results:   Dg Chest 2 View  Result Date: 02/18/2018 CLINICAL DATA:  Cough and congestion.  Shortness of breath. EXAM: CHEST  2 VIEW COMPARISON:  CT 11/19/2017. Chest x-ray 11/17/2017, 01/05/2017, 08/29/2016, 05/25/2016. FINDINGS: Mediastinum hilar structures normal. Stable diffuse bilateral interstitial prominence noted consistent chronic interstitial lung disease. No pleural effusion or pneumothorax. Left mastectomy. Surgical clips left axilla. IMPRESSION: Low lung volumes. Diffuse bilateral interstitial prominence noted consistent with chronic interstitial lung disease. No change from prior exam. No acute abnormality identified. Electronically Signed   By: Marcello Moores  Register   On: 02/18/2018 12:25   Mm Screening Breast Tomo Uni R  Result Date: 01/22/2018 CLINICAL DATA:   Screening. EXAM: DIGITAL SCREENING UNILATERAL RIGHT MAMMOGRAM WITH CAD AND ADJUNCT TOMO COMPARISON:  Previous exam(s). ACR Breast Density Category b: There are scattered areas of fibroglandular density. FINDINGS: The patient has had a left mastectomy. There are no findings suspicious for malignancy. Images were processed with CAD. IMPRESSION: No mammographic evidence of malignancy. A result letter of this screening mammogram will be mailed directly to the patient. RECOMMENDATION: Screening mammogram in one year.  (Code:SM-R-77M) BI-RADS CATEGORY  1: Negative. Electronically Signed   By: Trude Mcburney M.D.   On: 01/22/2018 13:50    My personal review of EKG: Sinus tachycardia at 103 bpm, no acute changes    Assessment & Plan  1.  Asthma/COPD exacerbation with bronchitis, recurrent. 2.  History of hypertension  Plan  Admit to MedSurg IV Levaquin Nebulizer treatment IV steroids   DVT Prophylaxis Lovenox  AM Labs Ordered, also please review Full Orders  Family Communication: Admission, patients condition and plan of care including tests being ordered have been discussed with the patient and husband who indicate understanding and agree with the plan and Code Status.  Code Status full  Disposition Plan: Home  Time spent in minutes : 42 minutes  Condition GUARDED   @SIGNATURE @

## 2018-02-18 NOTE — ED Notes (Signed)
Have paged respiratory  

## 2018-02-18 NOTE — ED Notes (Signed)
Pt's O2 sats dropped to 83% on RA while lying in bed. Pleth was normal

## 2018-02-18 NOTE — ED Triage Notes (Signed)
Pt c/o cough, congestion, SOB and chest pain starting 2 days ago. Pt took nebulizer treatment at home with no relief. Central chest pain with back pain.

## 2018-02-19 ENCOUNTER — Other Ambulatory Visit: Payer: Self-pay

## 2018-02-19 DIAGNOSIS — I5033 Acute on chronic diastolic (congestive) heart failure: Secondary | ICD-10-CM

## 2018-02-19 DIAGNOSIS — K5730 Diverticulosis of large intestine without perforation or abscess without bleeding: Secondary | ICD-10-CM

## 2018-02-19 DIAGNOSIS — J4551 Severe persistent asthma with (acute) exacerbation: Secondary | ICD-10-CM

## 2018-02-19 DIAGNOSIS — J9601 Acute respiratory failure with hypoxia: Secondary | ICD-10-CM

## 2018-02-19 DIAGNOSIS — I10 Essential (primary) hypertension: Secondary | ICD-10-CM

## 2018-02-19 LAB — BASIC METABOLIC PANEL
ANION GAP: 10 (ref 5–15)
BUN: 14 mg/dL (ref 6–20)
CALCIUM: 8.7 mg/dL — AB (ref 8.9–10.3)
CHLORIDE: 98 mmol/L — AB (ref 101–111)
CO2: 26 mmol/L (ref 22–32)
Creatinine, Ser: 0.87 mg/dL (ref 0.44–1.00)
GFR calc non Af Amer: >60 mL/min (ref >60)
GLUCOSE: 321 mg/dL — AB (ref 65–99)
Potassium: 4.4 mmol/L (ref 3.5–5.1)
Sodium: 134 mmol/L — ABNORMAL LOW (ref 135–145)

## 2018-02-19 LAB — PROCALCITONIN

## 2018-02-19 LAB — GLUCOSE, CAPILLARY
GLUCOSE-CAPILLARY: 125 mg/dL — AB (ref 65–99)
Glucose-Capillary: 320 mg/dL — ABNORMAL HIGH (ref 65–99)
Glucose-Capillary: 148 mg/dL — ABNORMAL HIGH (ref 65–99)

## 2018-02-19 LAB — HEMOGLOBIN A1C
HEMOGLOBIN A1C: 6.2 % — AB (ref 4.8–5.6)
MEAN PLASMA GLUCOSE: 131.24 mg/dL

## 2018-02-19 MED ORDER — INSULIN ASPART 100 UNIT/ML ~~LOC~~ SOLN
4.0000 [IU] | Freq: Three times a day (TID) | SUBCUTANEOUS | Status: DC
  Administered 2018-02-19 – 2018-02-20 (×3): 4 [IU] via SUBCUTANEOUS

## 2018-02-19 MED ORDER — METHYLPREDNISOLONE SODIUM SUCC 125 MG IJ SOLR
60.0000 mg | Freq: Every day | INTRAMUSCULAR | Status: DC
  Administered 2018-02-20: 60 mg via INTRAVENOUS
  Filled 2018-02-19: qty 2

## 2018-02-19 MED ORDER — FUROSEMIDE 10 MG/ML IJ SOLN
20.0000 mg | Freq: Two times a day (BID) | INTRAMUSCULAR | Status: DC
  Administered 2018-02-19 – 2018-02-20 (×3): 20 mg via INTRAVENOUS
  Filled 2018-02-19 (×3): qty 2

## 2018-02-19 MED ORDER — INSULIN ASPART 100 UNIT/ML ~~LOC~~ SOLN
0.0000 [IU] | Freq: Three times a day (TID) | SUBCUTANEOUS | Status: DC
  Administered 2018-02-19: 20 [IU] via SUBCUTANEOUS
  Administered 2018-02-19: 3 [IU] via SUBCUTANEOUS
  Administered 2018-02-20: 7 [IU] via SUBCUTANEOUS

## 2018-02-19 MED ORDER — BUDESONIDE 0.5 MG/2ML IN SUSP
0.5000 mg | Freq: Two times a day (BID) | RESPIRATORY_TRACT | Status: DC
  Administered 2018-02-19 – 2018-02-20 (×2): 0.5 mg via RESPIRATORY_TRACT
  Filled 2018-02-19 (×2): qty 2

## 2018-02-19 MED ORDER — INSULIN ASPART 100 UNIT/ML ~~LOC~~ SOLN: 0.0000 [IU] | Freq: Every day | SUBCUTANEOUS | Status: DC

## 2018-02-19 MED ORDER — METHYLPREDNISOLONE SODIUM SUCC 125 MG IJ SOLR
60.0000 mg | Freq: Four times a day (QID) | INTRAMUSCULAR | Status: AC
  Administered 2018-02-19: 60 mg via INTRAVENOUS

## 2018-02-19 MED ORDER — LORAZEPAM 1 MG PO TABS: 1.0000 mg | ORAL_TABLET | Freq: Two times a day (BID) | ORAL | Status: DC | PRN

## 2018-02-19 MED ORDER — IPRATROPIUM-ALBUTEROL 0.5-2.5 (3) MG/3ML IN SOLN
3.0000 mL | Freq: Three times a day (TID) | RESPIRATORY_TRACT | Status: DC
  Administered 2018-02-20: 3 mL via RESPIRATORY_TRACT
  Filled 2018-02-19: qty 3

## 2018-02-19 NOTE — Progress Notes (Signed)
PROGRESS NOTE  Ann Fuller BSJ:[id number] DOB: 1946/09/26 DOA: 02/18/2018 PCP: Ann Noble, MD  Brief History:  72 year old female with a history of aortic stenosis followed by cardiology at [facility], eosinophilic asthma, hypertension, diabetes mellitus type 2, hyperlipidemia, depression, and breast cancer in remission presented with 2-day history of shortness of breath and coughing with yellow sputum.  The patient complains of some chills but had denied any PND, orthopnea, chest pain, nausea, vomiting, abdominal pain.  Patient states that she was recently hospitalized on January 30, 2018 in [place].  She was treated for pneumonia and discharged with levofloxacin at that time.  Upon presentation, the patient was noted to be hypoxic and placed on 2 L with improvement to 95%.  She was started on intravenous Solu-Medrol.  Assessment/Plan: Acute respiratory failure with hypoxia -Multifactorial including CHF, asthma exacerbation, deconditioning, and likely underlying OSA/OHS -Presently stable on 2 L -Wean oxygen for saturation greater than 92%  Acute asthma exacerbation -Continue IV Solu-Medrol--decrease to once daily -Start Pulmicort -Respiratory virus panel -Check procalcitonin -Continue duo nebs  Acute on chronic diastolic CHF -The patient appears hypervolemic on exam -start IV lasix -daily weights -am BMP -personally reviewed CXR--interstitial prominence, no consolidation -repeat Echo -08/27/2017 echo EF 60-65%, moderate left ear, trace TR, mild MR. -Continue carvedilol  Moderate Aortic Stenosis -08/27/2017 echo EF 60-65%, moderate left ear, trace TR, mild MR. -follows cardiology at [facility]  Diabetes mellitus type 2 -Hemoglobin A1c -NovoLog sliding scale--increased resistance scale -Continue Lantus home dose -Add NovoLog pre-meal  Essential hypertension -Continue amlodipine and ARB  Anxiety/depression -Continue Zoloft  Hyperlipidemia -Continue  statin  Morbid Obesity -BMI 47.84 -lifestyle modification    Disposition Plan:   Home in 2-3 days  Family Communication:   No Family at bedside  Consultants:  none  Code Status:  FULL  DVT Prophylaxis:  [place] Lovenox   Procedures: As Listed in Progress Note Above  Antibiotics: Doxy 2/16>>>   Subjective: Patient states that she is breathing 25% better.  She denies any fevers, chills, headache, chest pain, nausea, vomiting, diarrhea, abdominal pain.  She has some dyspnea on exertion.  She complains of a nonproductive cough.  Objective: Vitals:   02/18/18 2247 02/19/18 0230 02/19/18 0500 02/19/18 0810  BP: (!) 141/65  135/70   Pulse: 100  95   Resp:   18   Temp: 98.4 F (36.9 C)  98.4 F (36.9 C)   TempSrc: Oral  Oral   SpO2: 98% 94% 95% 96%  Weight: 107.5 kg (237 lb)  107.5 kg (236 lb 16 oz)   Height: 4\' 11"  (1.499 m)       Intake/Output Summary (Last 24 hours) at 02/19/2018 0846 Last data filed at 02/19/2018 0500 Gross per 24 hour  Intake 630 ml  Output -  Net 630 ml   Weight change:  Exam:   General:  Pt is alert, follows commands appropriately, not in acute distress  HEENT: No icterus, No thrush, No neck mass, [place]/AT  Cardiovascular: RRR, S1/S2, no rubs, no gallops  Respiratory: Bibasilar crackles.  No wheezing.  Good air movement.  Abdomen: Soft/+BS, non tender, non distended, no guarding  Extremities: 1 + LE edema, No lymphangitis, No petechiae, No rashes, no synovitis   Data Reviewed: I have personally reviewed following labs and imaging studies Basic Metabolic Panel: Recent Labs  Lab 02/18/18 1008 02/19/18 0544  NA 138 134*  K 3.9 4.4  CL 101 98*  CO2 26  26  GLUCOSE 152* 321*  BUN 7 14  CREATININE 0.73 0.87  CALCIUM 9.1 8.7*   Liver Function Tests: No results for input(s): AST, ALT, ALKPHOS, BILITOT, PROT, ALBUMIN in the last 168 hours. No results for input(s): LIPASE, AMYLASE in the last 168 hours. No results for input(s): AMMONIA  in the last 168 hours. Coagulation Profile: No results for input(s): INR, PROTIME in the last 168 hours. CBC: Recent Labs  Lab 02/18/18 1008  WBC 10.1  HGB 9.4*  HCT 30.4*  MCV 93.0  PLT 230   Cardiac Enzymes: No results for input(s): CKTOTAL, CKMB, CKMBINDEX, TROPONINI in the last 168 hours. BNP: Invalid input(s): POCBNP CBG: Recent Labs  Lab 02/18/18 2246 02/19/18 0749  GLUCAP 398* 320*   HbA1C: No results for input(s): HGBA1C in the last 72 hours. Urine analysis:    Component Value Date/Time   COLORURINE YELLOW 04/06/2015 1935   APPEARANCEUR CLEAR 04/06/2015 1935   LABSPEC 1.015 04/06/2015 1935   PHURINE 5.5 04/06/2015 1935   GLUCOSEU NEGATIVE 04/06/2015 1935   GLUCOSEU NEG mg/dL 11/21/2010 1308   HGBUR TRACE (A) 04/06/2015 1935   BILIRUBINUR NEGATIVE 04/06/2015 1935   KETONESUR NEGATIVE 04/06/2015 1935   PROTEINUR NEGATIVE 04/06/2015 1935   UROBILINOGEN 0.2 04/06/2015 1935   NITRITE NEGATIVE 04/06/2015 1935   LEUKOCYTESUR NEGATIVE 04/06/2015 1935   Sepsis Labs: @LABRCNTIP (procalcitonin:4,lacticidven:4) ) Recent Results (from the past 240 hour(s))  Culture, blood (Routine X 2) w Reflex to ID Panel     Status: None (Preliminary result)   Collection Time: 02/18/18 11:10 AM  Result Value Ref Range Status   Specimen Description BLOOD RIGHT FOREARM  Final   Special Requests Blood Culture adequate volume  Final   Culture   Final    NO GROWTH < 24 HOURS Performed at [facility], [address], [place], [place] [address]    Report Status PENDING  Incomplete  Culture, blood (Routine X 2) w Reflex to ID Panel     Status: None (Preliminary result)   Collection Time: 02/18/18 11:10 AM  Result Value Ref Range Status   Specimen Description BLOOD RIGHT ANTECUBITAL  Final   Special Requests Blood Culture adequate volume  Final   Culture   Final    NO GROWTH < 24 HOURS Performed at [facility], [address], [place], [place] [address]    Report Status PENDING   Incomplete     Scheduled Meds: . amLODipine  5 mg Oral Daily  . aspirin EC  81 mg Oral Daily  . atorvastatin  40 mg Oral QHS  . carvedilol  3.125 mg Oral BID WC  . dicyclomine  10 mg Oral TID AC & HS  . doxycycline  100 mg Oral BID  . enoxaparin (LOVENOX) injection  55 mg Subcutaneous Q24H  . exenatide  10 mcg Subcutaneous BID WC  . furosemide  20 mg Oral Daily  . guaiFENesin  600 mg Oral BID  . insulin aspart  0-5 Units Subcutaneous QHS  . insulin aspart  0-9 Units Subcutaneous TID WC  . insulin glargine  26 Units Subcutaneous q morning - 10a  . ipratropium-albuterol  3 mL Nebulization Q6H  . irbesartan  37.5 mg Oral Daily  . magnesium oxide  400 mg Oral Daily  . methylPREDNISolone (SOLU-MEDROL) injection  60 mg Intravenous Q6H  . mometasone-formoterol  2 puff Inhalation BID  . montelukast  10 mg Oral QHS  . pantoprazole  40 mg Oral Daily  . sertraline  50 mg  Oral Daily  . sodium chloride flush  3 mL Intravenous Q12H  . vitamin C  500 mg Oral Daily   Continuous Infusions: . sodium chloride    . albuterol 10 mg/hr (02/18/18 1412)  . levofloxacin (LEVAQUIN) IV Stopped (02/18/18 1922)    Procedures/Studies: Dg Chest 2 View  Result Date: 02/18/2018 CLINICAL DATA:  Cough and congestion.  Shortness of breath. EXAM: CHEST  2 VIEW COMPARISON:  CT 11/19/2017. Chest x-ray 11/17/2017, 01/05/2017, 08/29/2016, 05/25/2016. FINDINGS: Mediastinum hilar structures normal. Stable diffuse bilateral interstitial prominence noted consistent chronic interstitial lung disease. No pleural effusion or pneumothorax. Left mastectomy. Surgical clips left axilla. IMPRESSION: Low lung volumes. Diffuse bilateral interstitial prominence noted consistent with chronic interstitial lung disease. No change from prior exam. No acute abnormality identified. Electronically Signed   By: Marcello Moores  Register   On: 02/18/2018 12:25   Mm Screening Breast Tomo Uni R  Result Date: 01/22/2018 CLINICAL DATA:  Screening.  EXAM: DIGITAL SCREENING UNILATERAL RIGHT MAMMOGRAM WITH CAD AND ADJUNCT TOMO COMPARISON:  Previous exam(s). ACR Breast Density Category b: There are scattered areas of fibroglandular density. FINDINGS: The patient has had a left mastectomy. There are no findings suspicious for malignancy. Images were processed with CAD. IMPRESSION: No mammographic evidence of malignancy. A result letter of this screening mammogram will be mailed directly to the patient. RECOMMENDATION: Screening mammogram in one year.  (Code:SM-R-71M) BI-RADS CATEGORY  1: Negative. Electronically Signed   By: Trude Mcburney M.D.   On: 01/22/2018 13:50    Orson Eva, DO  [facility] Pager [phone]  If 7PM-7AM, please contact night-coverage [email or web address] Password [facility] 02/19/2018, 8:46 AM   LOS: 1 day

## 2018-02-19 NOTE — Care Management Note (Signed)
Case Management Note  Patient Details  Name: KHRISTY KALAN MRN: [id number] Date of Birth: 1946-01-29  Subjective/Objective:  Adm with COPD exacerbation. From home with husband. Has cane, RW and BSC pta. Uses cane at times. Has neb machine and inhalers. Acutely on oxygen. Has PCP- Dr. Willey Blade. Recommended for [facility] PT. Would like [facility].                    Action/Plan: Juliann Pulse of [facility] notified and will obtain orders when available. CM will follow for oxygen needs.   Expected Discharge Date:    unk              Expected Discharge Plan:  [place]  In-House Referral:     Discharge planning Services  CM Consult  Post Acute Care Choice:  Home Health Choice offered to:  Patient  DME Arranged:    DME Agency:     HH Arranged:  PT [place]:  [place]  Status of Service:  In process, will continue to follow  If discussed at Long Length of Stay Meetings, dates discussed:    Additional Comments:  Saydi Kobel, Chauncey Reading, RN 02/19/2018, 12:20 PM

## 2018-02-19 NOTE — Evaluation (Signed)
Physical Therapy Evaluation Patient Details Name: Ann Fuller MRN: [id number] DOB: 22-Sep-1946 Today's Date: 02/19/2018   History of Present Illness  71 y.o. female qas admitted with recent PNA and noted to have low lung volumes, on droplet precautions was found to have acute respiratory failure.  PMHx:  intersitital lung disease, DM, breast CA, polyps, asthma, lumbar disc bulge, GERD, HTN, adenoma, CHF, IBS, OA  Clinical Impression  Pt is up to walk with PT without AD as she is not typically needing one but did allow that she would feel better with a walker.  Her O2 sats without supplemental O2 were 92% but after sitting awhile briefly dropped to 88%.  Replaced O2 but was able to maintain with gait.  Would recommend ck with RW to see if this gives her an advantage with the activity.      Follow Up Recommendations Home health PT;Supervision - Intermittent    Equipment Recommendations  Rolling walker with 5" wheels(if hers is not in good repair)    Recommendations for Other Services       Precautions / Restrictions Precautions Precautions: Fall(telemetry) Precaution Comments: ck O2 sats with actiivty Restrictions Weight Bearing Restrictions: No      Mobility  Bed Mobility Overal bed mobility: Needs Assistance Bed Mobility: Supine to Sit;Sit to Supine     Supine to sit: Min assist Sit to supine: Min assist   General bed mobility comments: minor help to support trunk to get OOB and to support legs back to bed  Transfers Overall transfer level: Needs assistance Equipment used: 1 person hand held assist Transfers: Sit to/from Stand Sit to Stand: Min assist         General transfer comment: cued hand placement and minimal support to power up  Ambulation/Gait Ambulation/Gait assistance: Min guard;Min assist Ambulation Distance (Feet): 80 Feet Assistive device: 1 person hand held assist(touching wall rail) Gait Pattern/deviations: Step-through pattern;Decreased  stride length;Drifts right/left;Wide base of support Gait velocity: reduced Gait velocity interpretation: Below normal speed for age/gender General Gait Details: fatigued with no supplemental O2 but staying in 90% range with effort  Stairs            Wheelchair Mobility    Modified Rankin (Stroke Patients Only)       Balance Overall balance assessment: Needs assistance Sitting-balance support: Feet supported Sitting balance-Leahy Scale: Good     Standing balance support: Bilateral upper extremity supported;Single extremity supported Standing balance-Leahy Scale: Fair                               Pertinent Vitals/Pain Pain Assessment: No/denies pain    Home Living Family/patient expects to be discharged to:: Private residence Living Arrangements: Spouse/significant other Available Help at Discharge: Family Type of Home: House Home Access: Level entry     Home Layout: One level Home Equipment: Environmental consultant - 2 wheels;Cane - single point      Prior Function Level of Independence: Independent(used SPC at times)               Hand Dominance   Dominant Hand: Right    Extremity/Trunk Assessment   Upper Extremity Assessment Upper Extremity Assessment: Overall WFL for tasks assessed    Lower Extremity Assessment Lower Extremity Assessment: Generalized weakness    Cervical / Trunk Assessment Cervical / Trunk Assessment: Normal  Communication   Communication: No difficulties  Cognition Arousal/Alertness: Awake/alert Behavior During Therapy: WFL for tasks assessed/performed Overall Cognitive  Status: Within Functional Limits for tasks assessed                                        General Comments      Exercises     Assessment/Plan    PT Assessment Patient needs continued PT services  PT Problem List Decreased strength;Decreased range of motion;Decreased activity tolerance;Decreased balance;Decreased mobility;Decreased  coordination;Cardiopulmonary status limiting activity;Obesity       PT Treatment Interventions DME instruction;Gait training;Functional mobility training;Therapeutic activities;Therapeutic exercise;Balance training;Neuromuscular re-education;Patient/family education    PT Goals (Current goals can be found in the Care Plan section)  Acute Rehab PT Goals Patient Stated Goal: to get home and feel better PT Goal Formulation: With patient Time For Goal Achievement: 03/05/18 Potential to Achieve Goals: Good    Frequency Min 3X/week   Barriers to discharge (has husband at home who can help her) none determined    Co-evaluation               AM-PAC PT "6 Clicks" Daily Activity  Outcome Measure Difficulty turning over in bed (including adjusting bedclothes, sheets and blankets)?: A Little Difficulty moving from lying on back to sitting on the side of the bed? : Unable Difficulty sitting down on and standing up from a chair with arms (e.g., wheelchair, bedside commode, etc,.)?: Unable Help needed moving to and from a bed to chair (including a wheelchair)?: A Little Help needed walking in hospital room?: A Little Help needed climbing 3-5 steps with a railing? : A Little 6 Click Score: 14    End of Session Equipment Utilized During Treatment: Gait belt;Oxygen(removed to walk but replaced) Activity Tolerance: Patient tolerated treatment well;Patient limited by fatigue Patient left: in bed;with call bell/phone within reach;with bed alarm set Nurse Communication: Mobility status PT Visit Diagnosis: Unsteadiness on feet (R26.81);Muscle weakness (generalized) (M62.81);Difficulty in walking, not elsewhere classified (R26.2)    Time: 3536-1443 PT Time Calculation (min) (ACUTE ONLY): 24 min   Charges:   PT Evaluation $PT Eval Low Complexity: 1 Low PT Treatments $Gait Training: 8-22 mins   PT G Codes:   PT G-Codes **NOT FOR INPATIENT CLASS** Functional Assessment Tool Used: AM-PAC 6  Clicks Basic Mobility   Ramond Dial 02/19/2018, 5:01 PM   5:06 PM, 02/19/18 Mee Hives, PT, MS Physical Therapist - [place] [phone] (Office)

## 2018-02-20 DIAGNOSIS — J441 Chronic obstructive pulmonary disease with (acute) exacerbation: Secondary | ICD-10-CM

## 2018-02-20 LAB — MAGNESIUM: MAGNESIUM: 1.9 mg/dL (ref 1.7–2.4)

## 2018-02-20 LAB — BASIC METABOLIC PANEL
Anion gap: 11 (ref 5–15)
BUN: 25 mg/dL — AB (ref 6–20)
CHLORIDE: 94 mmol/L — AB (ref 101–111)
CO2: 27 mmol/L (ref 22–32)
Calcium: 9.1 mg/dL (ref 8.9–10.3)
Creatinine, Ser: 0.91 mg/dL (ref 0.44–1.00)
GFR calc Af Amer: >60 mL/min (ref >60)
GFR calc non Af Amer: >60 mL/min (ref >60)
GLUCOSE: 198 mg/dL — AB (ref 65–99)
Potassium: 4.1 mmol/L (ref 3.5–5.1)
SODIUM: 132 mmol/L — AB (ref 135–145)

## 2018-02-20 LAB — RESPIRATORY PANEL BY PCR
Adenovirus: NOT DETECTED
BORDETELLA PERTUSSIS-RVPCR: NOT DETECTED
CORONAVIRUS 229E-RVPPCR: NOT DETECTED
Chlamydophila pneumoniae: NOT DETECTED
Coronavirus HKU1: NOT DETECTED
Coronavirus NL63: NOT DETECTED
Coronavirus OC43: NOT DETECTED
Influenza A: NOT DETECTED
Influenza B: NOT DETECTED
MYCOPLASMA PNEUMONIAE-RVPPCR: NOT DETECTED
Metapneumovirus: NOT DETECTED
Parainfluenza Virus 1: NOT DETECTED
Parainfluenza Virus 2: NOT DETECTED
Parainfluenza Virus 3: NOT DETECTED
Parainfluenza Virus 4: NOT DETECTED
RESPIRATORY SYNCYTIAL VIRUS-RVPPCR: NOT DETECTED
Rhinovirus / Enterovirus: NOT DETECTED

## 2018-02-20 LAB — GLUCOSE, CAPILLARY
Glucose-Capillary: 364 mg/dL — ABNORMAL HIGH (ref 65–99)
Glucose-Capillary: 202 mg/dL — ABNORMAL HIGH (ref 65–99)

## 2018-02-20 MED ORDER — DOXYCYCLINE HYCLATE 100 MG PO TABS: 100.0000 mg | ORAL_TABLET | Freq: Two times a day (BID) | ORAL | 0 refills | Status: DC

## 2018-02-20 MED ORDER — PREDNISONE 10 MG PO TABS: 50.0000 mg | ORAL_TABLET | Freq: Every day | ORAL | 0 refills | Status: DC

## 2018-02-20 NOTE — Discharge Summary (Signed)
Physician Discharge Summary  BRAEDEN DOLINSKI GXQ:[id number] DOB: 02/02/1946 DOA: 02/18/2018  PCP: Asencion Noble, MD  Admit date: 02/18/2018 Discharge date: 02/20/2018  Admitted From: home Disposition:  Home  Recommendations for Outpatient Follow-up:  1. Follow up with PCP in 1-2 weeks 2. Please obtain BMP/CBC in one week   Home Health: YES Equipment/Devices: HHPT  Discharge Condition: Stable CODE STATUS: FULL Diet recommendation: Heart Healthy / Carb Modified    Brief/Interim Summary: 72 year old female with a history of aortic stenosis followed by cardiology at [facility], eosinophilic asthma, hypertension, diabetes mellitus type 2, hyperlipidemia, depression, and breast cancer in remission presented with 2-day history of shortness of breath and coughing with yellow sputum.  The patient complains of some chills but had denied any PND, orthopnea, chest pain, nausea, vomiting, abdominal pain.  Patient states that she was recently hospitalized on January 30, 2018 in [place].  She was treated for pneumonia and discharged with levofloxacin at that time.  Upon presentation, the patient was noted to be hypoxic and placed on 2 L with improvement to 95%.  She was started on intravenous Solu-Medrol.    Discharge Diagnoses:  Acute respiratory failure with hypoxia -Multifactorial including CHF, asthma exacerbation, deconditioning, and likely underlying OSA/OHS -Presently stable on 2 L>>weaned to RA -Wean oxygen for saturation greater than 92% -ambulatory pulse ox on day of d/c did not show any desaturation -her continue dyspnea on exertion is likely due to deconditioning and has been chronic in reviewing medical records  Acute asthma exacerbation -Continue IV Solu-Medrol>>>home with prednisone taper -Started Pulmicort during hospitalization -Respiratory virus panel--neg -Check procalcitonin<0.10 -Continue duo nebs -doxycycline x 2 more days to complete 5 days of tx  Acute on  chronic diastolic CHF -The patient appears hypervolemic on exam--mild -start IV lasix>>>d/c on home dose of lasix -NEG 1 L -am BMP -personally reviewed CXR--interstitial prominence, no consolidation -08/27/2017 echo EF 60-65%, moderate left ear, trace TR, mild MR. -Continue carvedilol  Moderate Aortic Stenosis -08/27/2017 echo EF 60-65%, moderate left ear, trace TR, mild MR. -follows cardiology at [facility]  Diabetes mellitus type 2 -Hemoglobin A1c--6.2 -CBGs elevated due to steroids -NovoLog sliding scale--increased resistance scale -Continue Lantus home dose -Add NovoLog pre-meal  Essential hypertension -Continue amlodipine and ARB  Anxiety/depression -Continue Zoloft  Hyperlipidemia -Continue statin  Morbid Obesity -BMI 47.84 -lifestyle modification      Discharge Instructions  Discharge Instructions    Diet - low sodium heart healthy   Complete by:  As directed    Increase activity slowly   Complete by:  As directed      Allergies as of 02/20/2018      Reactions   Ceftin [cefuroxime Axetil] Swelling   Codeine Swelling   Tongue swells   Metformin And Related Diarrhea   Nexium [esomeprazole Magnesium] Diarrhea   Omnicef [cefdinir] Diarrhea   Sulfa Antibiotics Rash      Medication List    STOP taking these medications   levofloxacin 750 MG tablet Commonly known as:  LEVAQUIN     TAKE these medications   AEROCHAMBER MV inhaler Use as instructed   albuterol 108 (90 Base) MCG/ACT inhaler Commonly known as:  PROVENTIL HFA;VENTOLIN HFA Inhale 2 puffs into the lungs every 4 (four) hours as needed for wheezing or shortness of breath.   amLODipine 5 MG tablet Commonly known as:  NORVASC Take 5 mg by mouth daily.   aspirin EC 81 MG tablet Take 81 mg by mouth daily.   baclofen 10 MG tablet Commonly known  as:  LIORESAL Take 5 mg by mouth daily as needed for muscle spasms.   BENICAR 40 MG tablet Generic drug:  olmesartan Take 40 mg by mouth  daily.   benzonatate 200 MG capsule Commonly known as:  TESSALON TAKE ONE CAPSULE BY MOUTH EVERY 6 HOURS FOR COUGH What changed:  See the new instructions.   budesonide-formoterol 80-4.5 MCG/ACT inhaler Commonly known as:  SYMBICORT Inhale 2 puffs into the lungs 2 (two) times daily.   BYETTA 10 MCG PEN 10 MCG/0.04ML Sopn injection Generic drug:  exenatide Inject 10 mcg into the skin 2 (two) times daily with a meal.   carvedilol 3.125 MG tablet Commonly known as:  COREG Take 1 tablet (3.125 mg total) by mouth 2 (two) times daily with a meal.   chlorpheniramine 4 MG tablet Commonly known as:  CHLOR-TRIMETON Take 4 mg by mouth every 4 (four) hours as needed (drippy nose, drainage, throat clearing).   dicyclomine 10 MG capsule Commonly known as:  BENTYL TAKE ONE CAPSULE BY MOUTH PRIOR TO MEALSAS NEEDED. NO MORE THAN FOUR TIMES DAILY. MAY CAUSE DROWINESS   doxazosin 2 MG tablet Commonly known as:  CARDURA Take 4 mg by mouth every morning.   doxycycline 100 MG tablet Commonly known as:  VIBRA-TABS Take 1 tablet (100 mg total) by mouth 2 (two) times daily.   furosemide 20 MG tablet Commonly known as:  LASIX Take 1 tablet (20 mg total) by mouth daily.   guaiFENesin 600 MG 12 hr tablet Commonly known as:  MUCINEX Take 1 tablet (600 mg total) by mouth 2 (two) times daily.   HUMALOG KWIKPEN [place] Inject 6 Units into the skin 2 (two) times daily.   Insulin Glargine 100 UNIT/ML Solostar Pen Commonly known as:  LANTUS Inject 26 Units into the skin every morning.   ipratropium-albuterol 0.5-2.5 (3) MG/3ML Soln Commonly known as:  DUONEB Take 3 mLs by nebulization every 4 (four) hours. What changed:    when to take this  reasons to take this   LIPITOR 40 MG tablet Generic drug:  atorvastatin Take 40 mg by mouth at bedtime.   LORazepam 1 MG tablet Commonly known as:  ATIVAN Take 1 mg by mouth 2 (two) times daily as needed. Anxiety/Sleep   magnesium oxide 400 MG  tablet Commonly known as:  MAG-OX Take 400 mg by mouth daily.   montelukast 10 MG tablet Commonly known as:  SINGULAIR TAKE ONE TABLET BY MOUTH EVERY NIGHT AT BEDTIME   pantoprazole 40 MG tablet Commonly known as:  PROTONIX TAKE ONE TABLET TWICE DAILY BEFORE A MEAL   predniSONE 10 MG tablet Commonly known as:  DELTASONE Take 5 tablets (50 mg total) by mouth daily with breakfast. And decrease by one tablet daily Start taking on:  02/21/2018 What changed:    how much to take  how to take this  when to take this  additional instructions   sertraline 50 MG tablet Commonly known as:  ZOLOFT Take 50 mg by mouth.   temazepam 15 MG capsule Commonly known as:  RESTORIL Take 15 mg by mouth at bedtime as needed for sleep.   vitamin C 500 MG tablet Commonly known as:  ASCORBIC ACID Take 500 mg by mouth daily as needed.            Durable Medical Equipment  (From admission, onward)        Start     Ordered   02/20/18 0911  For home use only DME Gilford Rile  rolling  Once    Question:  Patient needs a walker to treat with the following condition  Answer:  Gait instability   02/20/18 0910     Follow-up Information    Schedule an appointment as soon as possible for a visit  with Asencion Noble, MD.   Specialty:  Internal Medicine Contact information: [address] [place] [address] [place].   Specialty:  Emergency Medicine Why:  As needed, If symptoms worsen Contact information: [address] [id number] m[place] [address] [phone]         Allergies  Allergen Reactions  . Ceftin [Cefuroxime Axetil] Swelling  . Codeine Swelling    Tongue swells  . Metformin And Related Diarrhea  . Nexium [Esomeprazole Magnesium] Diarrhea  . Omnicef [Cefdinir] Diarrhea  . Sulfa Antibiotics Rash    Consultations:  none   Procedures/Studies: Dg Chest 2 View  Result Date:  02/18/2018 CLINICAL DATA:  Cough and congestion.  Shortness of breath. EXAM: CHEST  2 VIEW COMPARISON:  CT 11/19/2017. Chest x-ray 11/17/2017, 01/05/2017, 08/29/2016, 05/25/2016. FINDINGS: Mediastinum hilar structures normal. Stable diffuse bilateral interstitial prominence noted consistent chronic interstitial lung disease. No pleural effusion or pneumothorax. Left mastectomy. Surgical clips left axilla. IMPRESSION: Low lung volumes. Diffuse bilateral interstitial prominence noted consistent with chronic interstitial lung disease. No change from prior exam. No acute abnormality identified. Electronically Signed   By: Marcello Moores  Register   On: 02/18/2018 12:25   Mm Screening Breast Tomo Uni R  Result Date: 01/22/2018 CLINICAL DATA:  Screening. EXAM: DIGITAL SCREENING UNILATERAL RIGHT MAMMOGRAM WITH CAD AND ADJUNCT TOMO COMPARISON:  Previous exam(s). ACR Breast Density Category b: There are scattered areas of fibroglandular density. FINDINGS: The patient has had a left mastectomy. There are no findings suspicious for malignancy. Images were processed with CAD. IMPRESSION: No mammographic evidence of malignancy. A result letter of this screening mammogram will be mailed directly to the patient. RECOMMENDATION: Screening mammogram in one year.  (Code:SM-R-54M) BI-RADS CATEGORY  1: Negative. Electronically Signed   By: Trude Mcburney M.D.   On: 01/22/2018 13:50        Discharge Exam: Vitals:   02/20/18 0802 02/20/18 0807  BP:    Pulse:    Resp:    Temp:    SpO2: 95% 99%   Vitals:   02/20/18 0500 02/20/18 0523 02/20/18 0802 02/20/18 0807  BP:  120/62    Pulse:  92    Resp:  18    Temp:  98.4 F (36.9 C)    TempSrc:  Oral    SpO2:  98% 95% 99%  Weight: 107.2 kg (236 lb 6.4 oz)     Height:        General: Pt is alert, awake, not in acute distress Cardiovascular: RRR, S1/S2 +, no rubs, no gallops Respiratory: CTA bilaterally, no wheezing, no rhonchi Abdominal: Soft, NT, ND, bowel sounds  + Extremities: no edema, no cyanosis   The results of significant diagnostics from this hospitalization (including imaging, microbiology, ancillary and laboratory) are listed below for reference.    Significant Diagnostic Studies: Dg Chest 2 View  Result Date: 02/18/2018 CLINICAL DATA:  Cough and congestion.  Shortness of breath. EXAM: CHEST  2 VIEW COMPARISON:  CT 11/19/2017. Chest x-ray 11/17/2017, 01/05/2017, 08/29/2016, 05/25/2016. FINDINGS: Mediastinum hilar structures normal. Stable diffuse bilateral interstitial prominence noted consistent chronic interstitial lung disease. No pleural effusion or pneumothorax. Left mastectomy. Surgical clips left  axilla. IMPRESSION: Low lung volumes. Diffuse bilateral interstitial prominence noted consistent with chronic interstitial lung disease. No change from prior exam. No acute abnormality identified. Electronically Signed   By: Marcello Moores  Register   On: 02/18/2018 12:25   Mm Screening Breast Tomo Uni R  Result Date: 01/22/2018 CLINICAL DATA:  Screening. EXAM: DIGITAL SCREENING UNILATERAL RIGHT MAMMOGRAM WITH CAD AND ADJUNCT TOMO COMPARISON:  Previous exam(s). ACR Breast Density Category b: There are scattered areas of fibroglandular density. FINDINGS: The patient has had a left mastectomy. There are no findings suspicious for malignancy. Images were processed with CAD. IMPRESSION: No mammographic evidence of malignancy. A result letter of this screening mammogram will be mailed directly to the patient. RECOMMENDATION: Screening mammogram in one year.  (Code:SM-R-16M) BI-RADS CATEGORY  1: Negative. Electronically Signed   By: Trude Mcburney M.D.   On: 01/22/2018 13:50     Microbiology: Recent Results (from the past 240 hour(s))  Culture, blood (Routine X 2) w Reflex to ID Panel     Status: None (Preliminary result)   Collection Time: 02/18/18 11:10 AM  Result Value Ref Range Status   Specimen Description BLOOD RIGHT FOREARM DRAWN BY RN  Final    Special Requests   Final    BOTTLES DRAWN AEROBIC AND ANAEROBIC Blood Culture adequate volume   Culture   Final    NO GROWTH 2 DAYS Performed at [facility], [address], [place], [place] [address]    Report Status PENDING  Incomplete  Culture, blood (Routine X 2) w Reflex to ID Panel     Status: None (Preliminary result)   Collection Time: 02/18/18 11:10 AM  Result Value Ref Range Status   Specimen Description BLOOD RIGHT ANTECUBITAL  Final   Special Requests Blood Culture adequate volume  Final   Culture   Final    NO GROWTH 2 DAYS Performed at [facility], [address], [place], [place] [address]    Report Status PENDING  Incomplete  Respiratory Panel by PCR     Status: None   Collection Time: 02/19/18  8:56 AM  Result Value Ref Range Status   Adenovirus NOT DETECTED NOT DETECTED Final   Coronavirus 229E NOT DETECTED NOT DETECTED Final   Coronavirus HKU1 NOT DETECTED NOT DETECTED Final   Coronavirus NL63 NOT DETECTED NOT DETECTED Final   Coronavirus OC43 NOT DETECTED NOT DETECTED Final   Metapneumovirus NOT DETECTED NOT DETECTED Final   Rhinovirus / Enterovirus NOT DETECTED NOT DETECTED Final   Influenza A NOT DETECTED NOT DETECTED Final   Influenza B NOT DETECTED NOT DETECTED Final   Parainfluenza Virus 1 NOT DETECTED NOT DETECTED Final   Parainfluenza Virus 2 NOT DETECTED NOT DETECTED Final   Parainfluenza Virus 3 NOT DETECTED NOT DETECTED Final   Parainfluenza Virus 4 NOT DETECTED NOT DETECTED Final   Respiratory Syncytial Virus NOT DETECTED NOT DETECTED Final   Bordetella pertussis NOT DETECTED NOT DETECTED Final   Chlamydophila pneumoniae NOT DETECTED NOT DETECTED Final   Mycoplasma pneumoniae NOT DETECTED NOT DETECTED Final    Comment: Performed at [facility], [place] [address], [place],  [address]     Labs: Basic Metabolic Panel: Recent Labs  Lab 02/18/18 1008 02/19/18 0544 02/20/18 0546  NA 138 134* 132*  K 3.9 4.4 4.1  CL 101 98* 94*  CO2  26 26 27   GLUCOSE 152* 321* 198*  BUN 7 14 25*  CREATININE 0.73 0.87 0.91  CALCIUM 9.1 8.7* 9.1  MG  --   --  1.9   Liver Function Tests: No results for input(s): AST, ALT, ALKPHOS, BILITOT, PROT, ALBUMIN in the last 168 hours. No results for input(s): LIPASE, AMYLASE in the last 168 hours. No results for input(s): AMMONIA in the last 168 hours. CBC: Recent Labs  Lab 02/18/18 1008  WBC 10.1  HGB 9.4*  HCT 30.4*  MCV 93.0  PLT 230   Cardiac Enzymes: No results for input(s): CKTOTAL, CKMB, CKMBINDEX, TROPONINI in the last 168 hours. BNP: Invalid input(s): POCBNP CBG: Recent Labs  Lab 02/19/18 0749 02/19/18 1146 02/19/18 1720 02/19/18 2043 02/20/18 0734  GLUCAP 320* 364* 125* 148* 202*    Time coordinating discharge:  Greater than 30 minutes  Signed:  Orson Eva, DO [facility] Pager: [phone] 02/20/2018, 9:24 AM

## 2018-02-20 NOTE — Care Management (Signed)
Patient discharging home. HH ordered with [facility]. She has been weaned to room air.  RW ordered, patient reports she has RW at home.

## 2018-02-20 NOTE — Progress Notes (Signed)
Patient discharged home.  Reviewed DC instructions and medications with patient.  Emphasized importance of completing doses of abx and steroids and following up with PCP.  Patient verbalizes understanding.  Instructed to return to ED for any worsening of symptoms.  All questions answered satisfactorily.  Patient in NAD and advised to call for assistance when ride arrives.

## 2018-02-23 LAB — CULTURE, BLOOD (ROUTINE X 2)
Culture: NO GROWTH
Culture: NO GROWTH
SPECIAL REQUESTS: ADEQUATE
Special Requests: ADEQUATE

## 2018-03-17 ENCOUNTER — Other Ambulatory Visit (HOSPITAL_COMMUNITY): Payer: Self-pay | Admitting: Internal Medicine

## 2018-03-17 ENCOUNTER — Ambulatory Visit (HOSPITAL_COMMUNITY)
Admission: RE | Admit: 2018-03-17 | Discharge: 2018-03-17 | Disposition: A | Discharge status: Home / Self Care | Payer: Medicare Other | Source: Ambulatory Visit | Attending: Internal Medicine | Admitting: Internal Medicine

## 2018-03-17 DIAGNOSIS — J984 Other disorders of lung: Secondary | ICD-10-CM | POA: Insufficient documentation

## 2018-03-17 DIAGNOSIS — J9809 Other diseases of bronchus, not elsewhere classified: Secondary | ICD-10-CM | POA: Insufficient documentation

## 2018-03-17 DIAGNOSIS — J151 Pneumonia due to Pseudomonas: Secondary | ICD-10-CM

## 2018-03-17 DIAGNOSIS — J189 Pneumonia, unspecified organism: Secondary | ICD-10-CM | POA: Diagnosis present

## 2018-04-22 ENCOUNTER — Other Ambulatory Visit (HOSPITAL_COMMUNITY): Payer: Self-pay | Admitting: Internal Medicine

## 2018-04-22 ENCOUNTER — Ambulatory Visit (HOSPITAL_COMMUNITY)
Admission: RE | Admit: 2018-04-22 | Discharge: 2018-04-22 | Disposition: A | Discharge status: Home / Self Care | Payer: Medicare Other | Source: Ambulatory Visit | Attending: Internal Medicine | Admitting: Internal Medicine

## 2018-04-22 DIAGNOSIS — I700 Atherosclerosis of aorta: Secondary | ICD-10-CM | POA: Diagnosis not present

## 2018-04-22 DIAGNOSIS — J189 Pneumonia, unspecified organism: Secondary | ICD-10-CM

## 2018-04-22 DIAGNOSIS — R918 Other nonspecific abnormal finding of lung field: Secondary | ICD-10-CM | POA: Insufficient documentation

## 2018-07-08 ENCOUNTER — Telehealth: Payer: Self-pay | Admitting: *Deleted

## 2018-07-08 ENCOUNTER — Ambulatory Visit: Payer: Medicare Other | Admitting: Internal Medicine

## 2018-07-08 ENCOUNTER — Telehealth: Payer: Self-pay

## 2018-07-08 ENCOUNTER — Encounter: Payer: Self-pay | Admitting: *Deleted

## 2018-07-08 ENCOUNTER — Other Ambulatory Visit: Payer: Self-pay | Admitting: *Deleted

## 2018-07-08 ENCOUNTER — Encounter: Payer: Self-pay | Admitting: Internal Medicine

## 2018-07-08 VITALS — BP 157/76 | HR 96 | Temp 97.2°F | Ht 59.0 in | Wt 239.2 lb

## 2018-07-08 DIAGNOSIS — K219 Gastro-esophageal reflux disease without esophagitis: Secondary | ICD-10-CM | POA: Diagnosis not present

## 2018-07-08 DIAGNOSIS — R1310 Dysphagia, unspecified: Secondary | ICD-10-CM | POA: Diagnosis not present

## 2018-07-08 DIAGNOSIS — R1319 Other dysphagia: Secondary | ICD-10-CM

## 2018-07-08 NOTE — Patient Instructions (Signed)
Schedule an EGD with esophageal dilation under propofol (GERD and dysphagia)  Continue Protonix 40 mg twice daily for now.  Continue Dicyclomine (Bentyl) as needed for occasional diarrhea  See Dr. Willey Blade regarding sleep difficulties  Further recommendations to follow.

## 2018-07-08 NOTE — Telephone Encounter (Signed)
Per Dr. Gala Romney, I informed Mindy of the following diabetic med adjustments for procedure.   Day before procedure: Lantus 20 units in the AM                                       Humalog  6 units in the Am and 3 units in the PM   Day of procedure:        No insulin prior to procedure

## 2018-07-08 NOTE — Telephone Encounter (Signed)
Pre-op scheduled for 08/14/18 at 1:45pm. Letter mailed. LMOVM

## 2018-07-08 NOTE — Progress Notes (Signed)
Primary Care Physician:  Asencion Noble, MD Primary Gastroenterologist:  Dr. Gala Romney  Pre-Procedure History & Physical: HPI:  Ann Fuller is a 72 y.o. female here for further evaluation of the GERD and dysphagia. 7 issues with congestive heart failure recurrent pneumonia and exacerbation of asthma. She is seeing Dr. Gardiner Rhyme  at [facility]. She was recently started on a biologic for respiratory issues.  States she has occasional breakthrough symptoms -  typical heartburn and notes esophageal dysphagia to meats which has  been insidiously over the past several months. Takes Protonix 40 mg twice daily. Previously on Nexium. I reviewed her "cooler" full of medications. She also takes dicyclomine for occasional diarrhea which she states occurs 1-2 times weekly. No rectal bleeding or melena;  no early satiety. She emains significantly obese with a BMI of 48. Patient states she cannot sleep at night;  Stays up most of the night; goers to the refrigerator and eats   Past Medical History:  Diagnosis Date  . Adenomatous colon polyp   . Arthritis   . Asthma   . Breast cancer ([place]) [id number]  . Bulging lumbar disc   . Depression   . Diverticulitis   . Dyspepsia   . Essential hypertension, benign   . GERD (gastroesophageal reflux disease)   . Heart murmur   . Hyperlipidemia   . IBS (irritable bowel syndrome)   . Internal hemorrhoid   . Right shoulder pain   . Tubular adenoma 11/2010  . Type 2 diabetes mellitus ([place])     Past Surgical History:  Procedure Laterality Date  . ABDOMINAL HYSTERECTOMY    . APPENDECTOMY    . BACK SURGERY  2007   Lumbar fusion  . CATARACT EXTRACTION W/PHACO Left 08/27/2013   Procedure: CATARACT EXTRACTION PHACO AND INTRAOCULAR LENS PLACEMENT (IOC);  Surgeon: Tonny Branch, MD;  Location: [facility];  Service: Ophthalmology;  Laterality: Left;  CDE 4.25  . CATARACT EXTRACTION W/PHACO Right 09/24/2013   Procedure: CATARACT EXTRACTION PHACO AND INTRAOCULAR LENS PLACEMENT  (IOC);  Surgeon: Tonny Branch, MD;  Location: [facility];  Service: Ophthalmology;  Laterality: Right;  CDE:  8.16  . CHOLECYSTECTOMY    . COLONOSCOPY  12/13/2010   Dr. Margarito Courser pancolonic diverticular.tubular adenoma  . COLONOSCOPY  07/13/2003  . COLONOSCOPY N/A 10/28/2015   HYQ:MVHQION diverticulosis, multiple tubular adenomas removed. next tcs 10/2020.   . ESOPHAGOGASTRODUODENOSCOPY  12/13/2010   Dr. Jennet Maduro hernia, fundal gland type polyps  . EYE SURGERY Left    Left KPE 08/27/13  . MASTECTOMY Left 1995  . NASAL ENDOSCOPY WITH EPISTAXIS CONTROL Left 07/16/2016   Procedure: ENDOSCOPIC LEFT NASAL CAUTERY;  Surgeon: Leta Baptist, MD;  Location: [place];  Service: ENT;  Laterality: Left;  . TONSILLECTOMY    . TUBAL LIGATION      Prior to Admission medications   Medication Sig Start Date End Date Taking? Authorizing Provider  albuterol (PROVENTIL HFA;VENTOLIN HFA) 108 (90 Base) MCG/ACT inhaler Inhale 2 puffs into the lungs every 4 (four) hours as needed for wheezing or shortness of breath.   Yes [provider]  amLODipine (NORVASC) 5 MG tablet Take 5 mg by mouth daily.   Yes [provider]  aspirin EC 81 MG tablet Take 81 mg by mouth daily.   Yes [provider]  atorvastatin (LIPITOR) 40 MG tablet Take 40 mg by mouth at bedtime.    Yes [provider]  baclofen (LIORESAL) 10 MG tablet Take 5 mg by mouth daily  as needed for muscle spasms.    Yes [provider]  Benralizumab (FASENRA) 30 MG/ML SOSY Inject into the skin every 8 (eight) weeks.   Yes [provider]  benzonatate (TESSALON) 200 MG capsule TAKE ONE CAPSULE BY MOUTH EVERY 6 HOURS FOR COUGH Patient taking differently: TAKE ONE CAPSULE BY MOUTH EVERY 6 HOURS FOR COUGH. Takes as needed 08/28/16  Yes Tanda Rockers, MD  budesonide-formoterol (SYMBICORT) 80-4.5 MCG/ACT inhaler Inhale 2 puffs into the lungs 2 (two) times daily. 08/31/16  Yes Tanda Rockers, MD    carvedilol (COREG) 3.125 MG tablet Take 1 tablet (3.125 mg total) by mouth 2 (two) times daily with a meal. 11/22/17  Yes Memon, Jolaine Artist, MD  dicyclomine (BENTYL) 10 MG capsule TAKE ONE CAPSULE BY MOUTH PRIOR TO MEALSAS NEEDED. NO MORE THAN FOUR TIMES DAILY. MAY CAUSE DROWINESS 12/05/17  Yes Annitta Needs, NP  doxazosin (CARDURA) 2 MG tablet Take 4 mg by mouth every morning.    Yes [provider]  EPINEPHrine (EPIPEN IJ) Inject as directed as needed.   Yes [provider]  exenatide (BYETTA 10 MCG PEN) 10 MCG/0.04ML SOLN Inject 10 mcg into the skin 2 (two) times daily with a meal.    Yes [provider]  furosemide (LASIX) 20 MG tablet Take 1 tablet (20 mg total) by mouth daily. 11/22/17  Yes Kathie Dike, MD  guaiFENesin (MUCINEX) 600 MG 12 hr tablet Take 1 tablet (600 mg total) by mouth 2 (two) times daily. Patient taking differently: Take 600 mg by mouth 2 (two) times daily as needed.  11/21/17  Yes Kathie Dike, MD  Insulin Glargine (LANTUS) 100 UNIT/ML Solostar Pen Inject 26 Units into the skin every morning.    Yes [provider]  Insulin Lispro (HUMALOG KWIKPEN [place]) Inject 6 Units into the skin 2 (two) times daily.    Yes [provider]  ipratropium-albuterol (DUONEB) 0.5-2.5 (3) MG/3ML SOLN Take 3 mLs by nebulization every 4 (four) hours. Patient taking differently: Take 3 mLs by nebulization every 6 (six) hours as needed (wheezing and shortness of breath).  04/10/15  Yes Asencion Noble, MD  LORazepam (ATIVAN) 1 MG tablet Take 1 mg by mouth 2 (two) times daily as needed. Anxiety/Sleep   Yes [provider]  magnesium oxide (MAG-OX) 400 MG tablet Take 400 mg by mouth daily.   Yes [provider]  montelukast (SINGULAIR) 10 MG tablet TAKE ONE TABLET BY MOUTH EVERY NIGHT AT BEDTIME 11/20/16  Yes Tanda Rockers, MD  olmesartan (BENICAR) 40 MG tablet Take 40 mg by mouth daily.    Yes [provider]  pantoprazole  (PROTONIX) 40 MG tablet TAKE ONE TABLET TWICE DAILY BEFORE A MEAL 02/05/18  Yes Annitta Needs, NP  predniSONE (DELTASONE) 10 MG tablet Take 5 tablets (50 mg total) by mouth daily with breakfast. And decrease by one tablet daily 02/21/18  Yes Tat, Shanon Brow, MD  sertraline (ZOLOFT) 50 MG tablet Take 50 mg by mouth.   Yes [provider]  Spacer/Aero-Holding Chambers (AEROCHAMBER MV) inhaler Use as instructed 08/31/16  Yes Tanda Rockers, MD  temazepam (RESTORIL) 15 MG capsule Take 15 mg by mouth at bedtime as needed for sleep.   Yes [provider]  vitamin C (ASCORBIC ACID) 500 MG tablet Take 500 mg by mouth daily as needed.    Yes [provider]  chlorpheniramine (CHLOR-TRIMETON) 4 MG tablet Take 4 mg by mouth every 4 (four) hours as needed (drippy  nose, drainage, throat clearing).     [provider]  doxycycline (VIBRA-TABS) 100 MG tablet Take 1 tablet (100 mg total) by mouth 2 (two) times daily. Patient not taking: Reported on 07/08/2018 02/20/18   Orson Eva, MD    Allergies as of 07/08/2018 - Review Complete 07/08/2018  Allergen Reaction Noted  . Ceftin [cefuroxime axetil] Swelling 05/02/2012  . Codeine Swelling   . Metformin and related Diarrhea 11/26/2012  . Nexium [esomeprazole magnesium] Diarrhea 05/02/2012  . Omnicef [cefdinir] Diarrhea 05/02/2012  . Sulfa antibiotics Rash 05/02/2012    Family History  Problem Relation Age of Onset  . Colon cancer Mother   . Heart attack Father   . Heart attack Sister   . Stroke Sister   . Cancer - Lung Sister     Social History   Socioeconomic History  . Marital status: Married    Spouse name: Not on file  . Number of children: Not on file  . Years of education: Not on file  . Highest education level: Not on file  Occupational History  . Not on file  Social Needs  . Financial resource strain: Not on file  . Food insecurity:    Worry: Not on file    Inability: Not on file  . Transportation needs:     Medical: Not on file    Non-medical: Not on file  Tobacco Use  . Smoking status: Never Smoker  . Smokeless tobacco: Never Used  Substance and Sexual Activity  . Alcohol use: No  . Drug use: No  . Sexual activity: Yes    Birth control/protection: Surgical  Lifestyle  . Physical activity:    Days per week: Not on file    Minutes per session: Not on file  . Stress: Not on file  Relationships  . Social connections:    Talks on phone: Not on file    Gets together: Not on file    Attends religious service: Not on file    Active member of club or organization: Not on file    Attends meetings of clubs or organizations: Not on file    Relationship status: Not on file  . Intimate partner violence:    Fear of current or ex partner: Not on file    Emotionally abused: Not on file    Physically abused: Not on file    Forced sexual activity: Not on file  Other Topics Concern  . Not on file  Social History Narrative  . Not on file    Review of Systems: See HPI, otherwise negative ROS  Physical Exam: BP (!) 157/76   Pulse 96   Temp (!) 97.2 F (36.2 C) (Oral)   Ht 4\' 11"  (1.499 m)   Wt 239 lb 3.2 oz (108.5 kg)   BMI 48.31 kg/m  General:   Alert,  Well-developed, well-nourished,obese,pleasant and cooperative in NAD . Heart:  Regular rate and rhythm; no murmurs, clicks, rubs,  or gallops. Abdomen: obese. Positive bowel sounds Soft and nontender without appreciable mass or hepatosplenomegaly.  Pulses:  Normal pulses noted. Extremities:  Without clubbing or edema.   Impression:   72 year old morbidly obese lady with numerous medical problems including ongoing issues with asthma, GERD and solid food dysphagia. She is on Protonix twice a day. She's having some breakthrough reflux symptoms on this regimen. Reflux may or may not be associated to a significant degree with her reactive airways disease. No doubt, her morbid obesity is predisposing her to reflux. She  does not have any  typical symptoms of gastroparesis.  She has significant sleep symptoms. I'm not sure whether it's just insomnia or she has underlying undiagnosed sleep apnea.  History of colonic polyps-due for surveillance colonoscopy 2020   Recommendations:  I think this nicely ought to go ahead and have an EGD with esophageal dilation as appropriate in the near future. We'll enlist the help of anesthesia for sedation. Depending on endoscopic findings, will render further recommendations regarding management of reflux. I explained to the patient that if she can achieve significant weight loss, this  would likely benefit from her across the board. I understand she is [facility] in the near future  I've offered the patient EGD with esophageal dilation as appropriate. The risks, benefits, limitations, alternatives and imponderables have been reviewed with the patient. Potential for esophageal dilation, biopsy, etc. have also been reviewed.  Questions have been answered. All parties agreeable. I recommended she continue dicyclomine when necessary for occasional diarrhea-likely has an element of  irritablle bowel syndrome.  Further recommendations to      Notice: This dictation was prepared with Dragon dictation along with smaller phrase technology. Any transcriptional errors that result from this process are unintentional and may not be corrected upon review.

## 2018-07-09 NOTE — Telephone Encounter (Signed)
Thank you :)

## 2018-07-22 ENCOUNTER — Other Ambulatory Visit: Payer: Self-pay | Admitting: Gastroenterology

## 2018-08-08 ENCOUNTER — Encounter (HOSPITAL_COMMUNITY): Payer: Self-pay | Admitting: Emergency Medicine

## 2018-08-08 ENCOUNTER — Emergency Department (HOSPITAL_COMMUNITY): Payer: Medicare Other

## 2018-08-08 ENCOUNTER — Other Ambulatory Visit: Payer: Self-pay

## 2018-08-08 ENCOUNTER — Observation Stay (HOSPITAL_BASED_OUTPATIENT_CLINIC_OR_DEPARTMENT_OTHER)
Admission: EM | Admit: 2018-08-08 | Discharge: 2018-08-09 | Disposition: A | Discharge status: Home / Self Care | Payer: Medicare Other | Source: Home / Self Care | Attending: Emergency Medicine | Admitting: Emergency Medicine

## 2018-08-08 DIAGNOSIS — Z7982 Long term (current) use of aspirin: Secondary | ICD-10-CM | POA: Insufficient documentation

## 2018-08-08 DIAGNOSIS — R0789 Other chest pain: Secondary | ICD-10-CM

## 2018-08-08 DIAGNOSIS — F419 Anxiety disorder, unspecified: Secondary | ICD-10-CM

## 2018-08-08 DIAGNOSIS — E668 Other obesity: Secondary | ICD-10-CM | POA: Insufficient documentation

## 2018-08-08 DIAGNOSIS — Z853 Personal history of malignant neoplasm of breast: Secondary | ICD-10-CM

## 2018-08-08 DIAGNOSIS — I5032 Chronic diastolic (congestive) heart failure: Secondary | ICD-10-CM

## 2018-08-08 DIAGNOSIS — R079 Chest pain, unspecified: Secondary | ICD-10-CM | POA: Diagnosis not present

## 2018-08-08 DIAGNOSIS — I5189 Other ill-defined heart diseases: Secondary | ICD-10-CM

## 2018-08-08 DIAGNOSIS — I10 Essential (primary) hypertension: Secondary | ICD-10-CM | POA: Diagnosis not present

## 2018-08-08 DIAGNOSIS — J82 Pulmonary eosinophilia, not elsewhere classified: Secondary | ICD-10-CM

## 2018-08-08 DIAGNOSIS — K219 Gastro-esophageal reflux disease without esophagitis: Secondary | ICD-10-CM

## 2018-08-08 DIAGNOSIS — E785 Hyperlipidemia, unspecified: Secondary | ICD-10-CM

## 2018-08-08 DIAGNOSIS — I110 Hypertensive heart disease with heart failure: Secondary | ICD-10-CM | POA: Insufficient documentation

## 2018-08-08 DIAGNOSIS — I350 Nonrheumatic aortic (valve) stenosis: Secondary | ICD-10-CM

## 2018-08-08 DIAGNOSIS — Z79899 Other long term (current) drug therapy: Secondary | ICD-10-CM

## 2018-08-08 DIAGNOSIS — I359 Nonrheumatic aortic valve disorder, unspecified: Secondary | ICD-10-CM

## 2018-08-08 DIAGNOSIS — J9601 Acute respiratory failure with hypoxia: Secondary | ICD-10-CM | POA: Diagnosis not present

## 2018-08-08 DIAGNOSIS — Z794 Long term (current) use of insulin: Secondary | ICD-10-CM

## 2018-08-08 DIAGNOSIS — E119 Type 2 diabetes mellitus without complications: Secondary | ICD-10-CM

## 2018-08-08 DIAGNOSIS — J4550 Severe persistent asthma, uncomplicated: Secondary | ICD-10-CM

## 2018-08-08 DIAGNOSIS — I509 Heart failure, unspecified: Secondary | ICD-10-CM | POA: Diagnosis not present

## 2018-08-08 HISTORY — DX: Heart failure, unspecified: I50.9

## 2018-08-08 LAB — BASIC METABOLIC PANEL
Anion gap: 8 (ref 5–15)
BUN: 21 mg/dL (ref 8–23)
CO2: 28 mmol/L (ref 22–32)
CREATININE: 1.07 mg/dL — AB (ref 0.44–1.00)
Calcium: 8.7 mg/dL — ABNORMAL LOW (ref 8.9–10.3)
Chloride: 95 mmol/L — ABNORMAL LOW (ref 98–111)
GFR calc Af Amer: 60 mL/min — ABNORMAL LOW (ref >60)
GFR, EST NON AFRICAN AMERICAN: 51 mL/min — AB (ref >60)
Glucose, Bld: 126 mg/dL — ABNORMAL HIGH (ref 70–99)
Potassium: 3.8 mmol/L (ref 3.5–5.1)
SODIUM: 131 mmol/L — AB (ref 135–145)

## 2018-08-08 LAB — GLUCOSE, CAPILLARY: GLUCOSE-CAPILLARY: 166 mg/dL — AB (ref 70–99)

## 2018-08-08 LAB — CBC
HCT: 27.7 % — ABNORMAL LOW (ref 36.0–46.0)
Hemoglobin: 8.8 g/dL — ABNORMAL LOW (ref 12.0–15.0)
MCH: 28.4 pg (ref 26.0–34.0)
MCHC: 31.8 g/dL (ref 30.0–36.0)
MCV: 89.4 fL (ref 78.0–100.0)
PLATELETS: 249 10*3/uL (ref 150–400)
RBC: 3.1 MIL/uL — ABNORMAL LOW (ref 3.87–5.11)
RDW: 14.9 % (ref 11.5–15.5)
WBC: 7.4 10*3/uL (ref 4.0–10.5)

## 2018-08-08 LAB — BRAIN NATRIURETIC PEPTIDE: B NATRIURETIC PEPTIDE 5: 78 pg/mL (ref 0.0–100.0)

## 2018-08-08 LAB — TROPONIN I

## 2018-08-08 MED ORDER — LORAZEPAM 1 MG PO TABS: 1.0000 mg | ORAL_TABLET | Freq: Two times a day (BID) | ORAL | Status: DC | PRN

## 2018-08-08 MED ORDER — SERTRALINE HCL 50 MG PO TABS
50.0000 mg | ORAL_TABLET | Freq: Every day | ORAL | Status: DC
  Administered 2018-08-09: 50 mg via ORAL
  Filled 2018-08-08: qty 1

## 2018-08-08 MED ORDER — FUROSEMIDE 20 MG PO TABS
20.0000 mg | ORAL_TABLET | Freq: Every day | ORAL | Status: DC
  Administered 2018-08-09: 20 mg via ORAL
  Filled 2018-08-08: qty 1

## 2018-08-08 MED ORDER — DOXAZOSIN MESYLATE 2 MG PO TABS
4.0000 mg | ORAL_TABLET | Freq: Every day | ORAL | Status: DC
  Administered 2018-08-09: 4 mg via ORAL
  Filled 2018-08-08: qty 2

## 2018-08-08 MED ORDER — AMLODIPINE BESYLATE 5 MG PO TABS
5.0000 mg | ORAL_TABLET | Freq: Every day | ORAL | Status: DC
  Administered 2018-08-09: 5 mg via ORAL
  Filled 2018-08-08: qty 1

## 2018-08-08 MED ORDER — MONTELUKAST SODIUM 10 MG PO TABS
10.0000 mg | ORAL_TABLET | Freq: Every day | ORAL | Status: DC
  Administered 2018-08-08: 10 mg via ORAL
  Filled 2018-08-08: qty 1

## 2018-08-08 MED ORDER — INSULIN ASPART 100 UNIT/ML ~~LOC~~ SOLN: 0.0000 [IU] | Freq: Every day | SUBCUTANEOUS | Status: DC

## 2018-08-08 MED ORDER — IRBESARTAN 300 MG PO TABS
300.0000 mg | ORAL_TABLET | Freq: Every day | ORAL | Status: DC
  Administered 2018-08-09: 300 mg via ORAL
  Filled 2018-08-08: qty 1

## 2018-08-08 MED ORDER — GI COCKTAIL ~~LOC~~: 30.0000 mL | Freq: Four times a day (QID) | ORAL | Status: DC | PRN

## 2018-08-08 MED ORDER — ONDANSETRON HCL 4 MG/2ML IJ SOLN: 4.0000 mg | Freq: Four times a day (QID) | INTRAMUSCULAR | Status: DC | PRN

## 2018-08-08 MED ORDER — NITROGLYCERIN 0.4 MG SL SUBL
0.4000 mg | SUBLINGUAL_TABLET | Freq: Once | SUBLINGUAL | Status: AC
  Administered 2018-08-08: 0.4 mg via SUBLINGUAL
  Filled 2018-08-08: qty 1

## 2018-08-08 MED ORDER — PANTOPRAZOLE SODIUM 40 MG PO TBEC
40.0000 mg | DELAYED_RELEASE_TABLET | Freq: Two times a day (BID) | ORAL | Status: DC
  Administered 2018-08-09: 40 mg via ORAL
  Filled 2018-08-08: qty 1

## 2018-08-08 MED ORDER — TEMAZEPAM 15 MG PO CAPS
15.0000 mg | ORAL_CAPSULE | Freq: Every evening | ORAL | Status: DC | PRN
  Administered 2018-08-08: 15 mg via ORAL
  Filled 2018-08-08: qty 1

## 2018-08-08 MED ORDER — ATORVASTATIN CALCIUM 40 MG PO TABS
40.0000 mg | ORAL_TABLET | Freq: Every day | ORAL | Status: DC
  Administered 2018-08-08: 40 mg via ORAL
  Filled 2018-08-08: qty 1

## 2018-08-08 MED ORDER — CARVEDILOL 3.125 MG PO TABS
3.1250 mg | ORAL_TABLET | Freq: Two times a day (BID) | ORAL | Status: DC
  Administered 2018-08-08 – 2018-08-09 (×2): 3.125 mg via ORAL
  Filled 2018-08-08 (×2): qty 1

## 2018-08-08 MED ORDER — DICYCLOMINE HCL 10 MG PO CAPS
10.0000 mg | ORAL_CAPSULE | Freq: Three times a day (TID) | ORAL | Status: DC
  Administered 2018-08-09: 10 mg via ORAL
  Filled 2018-08-08: qty 1

## 2018-08-08 MED ORDER — ENOXAPARIN SODIUM 40 MG/0.4ML ~~LOC~~ SOLN
40.0000 mg | SUBCUTANEOUS | Status: DC
  Administered 2018-08-08: 40 mg via SUBCUTANEOUS
  Filled 2018-08-08: qty 0.4

## 2018-08-08 MED ORDER — ACETAMINOPHEN 325 MG PO TABS
650.0000 mg | ORAL_TABLET | ORAL | Status: DC | PRN
  Administered 2018-08-08: 650 mg via ORAL
  Filled 2018-08-08: qty 2

## 2018-08-08 MED ORDER — INSULIN ASPART 100 UNIT/ML ~~LOC~~ SOLN: 0.0000 [IU] | Freq: Three times a day (TID) | SUBCUTANEOUS | Status: DC

## 2018-08-08 MED ORDER — MOMETASONE FURO-FORMOTEROL FUM 100-5 MCG/ACT IN AERO
2.0000 | INHALATION_SPRAY | Freq: Two times a day (BID) | RESPIRATORY_TRACT | Status: DC
  Administered 2018-08-09: 2 via RESPIRATORY_TRACT
  Filled 2018-08-08: qty 8.8

## 2018-08-08 MED ORDER — BACLOFEN 10 MG PO TABS: 5.0000 mg | ORAL_TABLET | Freq: Three times a day (TID) | ORAL | Status: DC | PRN

## 2018-08-08 MED ORDER — ALBUTEROL SULFATE (2.5 MG/3ML) 0.083% IN NEBU
3.0000 mL | INHALATION_SOLUTION | RESPIRATORY_TRACT | Status: DC | PRN
  Administered 2018-08-09: 3 mL via RESPIRATORY_TRACT
  Filled 2018-08-08: qty 3

## 2018-08-08 MED ORDER — MORPHINE SULFATE (PF) 2 MG/ML IV SOLN
2.0000 mg | INTRAVENOUS | Status: DC | PRN
  Administered 2018-08-09: 2 mg via INTRAVENOUS
  Filled 2018-08-08: qty 1

## 2018-08-08 MED ORDER — ASPIRIN EC 81 MG PO TBEC
81.0000 mg | DELAYED_RELEASE_TABLET | Freq: Every day | ORAL | Status: DC
  Administered 2018-08-09: 81 mg via ORAL
  Filled 2018-08-08: qty 1

## 2018-08-08 NOTE — ED Provider Notes (Signed)
[facility] EMERGENCY DEPARTMENT Provider Note   CSN: [id number] Arrival date & time: 08/08/18  1704     History   Chief Complaint Chief Complaint  Patient presents with  . Chest Pain    HPI Ann Fuller is a 72 y.o. female.  HPI Patient presents with chest pain.  Dull in her mid chest.  Goes to her left neck.  Has had episodes of it since yesterday.  Thinks it may be worse with exertion although she has not really done and exertion today.  States she has had less energy over the last week.  Rare cough.  No sputum production.  No fevers.  It is dull.  Continues to have pain.  Has had decreased appetite but when she did eat did not notice any change in the pain.  History of CHF and states her weight is up around 3 pounds of the last couple days. Past Medical History:  Diagnosis Date  . Adenomatous colon polyp   . Arthritis   . Asthma   . Breast cancer ([place]) [id number]  . Bulging lumbar disc   . CHF (congestive heart failure) ([place])   . Depression   . Diverticulitis   . Dyspepsia   . Essential hypertension, benign   . GERD (gastroesophageal reflux disease)   . Heart murmur   . Hyperlipidemia   . IBS (irritable bowel syndrome)   . Internal hemorrhoid   . Right shoulder pain   . Tubular adenoma 11/2010  . Type 2 diabetes mellitus [facility])     Patient Active Problem List   Diagnosis Date Noted  . Acute respiratory failure with hypoxia ([place]) 02/19/2018  . Acute on chronic diastolic CHF (congestive heart failure) ([place]) 02/19/2018  . COPD with acute exacerbation ([place]) 02/18/2018  . Bulging lumbar disc 11/18/2017  . Acute asthma exacerbation 11/17/2017  . Lactic acidosis 11/17/2017  . Abdominal pain, LLQ 01/22/2017  . LUQ abdominal pain 01/22/2017  . Normocytic anemia 01/22/2017  . Cough 09/10/2016  . Dilation of biliary tract 09/10/2016  . Breast pain, right 08/06/2016  . CAP (community acquired pneumonia) 04/29/2016  . History of adenomatous polyp of colon   .  Diverticulosis of colon without hemorrhage   . Adenomatous polyp of colon 10/17/2015  . Upper airway cough syndrome 10/10/2015  . Morbid obesity ([name]) 10/10/2015  . Diastolic dysfunction 38/09/1750  . Insomnia 04/06/2015  . Cough variant asthma 01/22/2015  . IBS (irritable bowel syndrome) 08/25/2014  . Syncope 03/08/2014  . Murmur, cardiac 03/08/2014  . Type 2 diabetes mellitus ([name]) 03/05/2014  . Hyperlipidemia 03/05/2014  . Essential hypertension, benign 03/05/2014  . GERD (gastroesophageal reflux disease) 11/23/2011  . Diarrhea 01/25/2011  . CARCINOMA, BREAST, HX OF 11/21/2010    Past Surgical History:  Procedure Laterality Date  . ABDOMINAL HYSTERECTOMY    . APPENDECTOMY    . BACK SURGERY  2007   Lumbar fusion  . CATARACT EXTRACTION W/PHACO Left 08/27/2013   Procedure: CATARACT EXTRACTION PHACO AND INTRAOCULAR LENS PLACEMENT (IOC);  Surgeon: Tonny Branch, MD;  Location: [facility];  Service: Ophthalmology;  Laterality: Left;  CDE 4.25  . CATARACT EXTRACTION W/PHACO Right 09/24/2013   Procedure: CATARACT EXTRACTION PHACO AND INTRAOCULAR LENS PLACEMENT (IOC);  Surgeon: Tonny Branch, MD;  Location: [facility];  Service: Ophthalmology;  Laterality: Right;  CDE:  8.16  . CHOLECYSTECTOMY    . COLONOSCOPY  12/13/2010   Dr. Margarito Courser pancolonic diverticular.tubular adenoma  . COLONOSCOPY  07/13/2003  . COLONOSCOPY N/A 10/28/2015  [id number] diverticulosis, multiple tubular adenomas removed. next tcs 10/2020.   . ESOPHAGOGASTRODUODENOSCOPY  12/13/2010   Dr. Jennet Maduro hernia, fundal gland type polyps  . EYE SURGERY Left    Left KPE 08/27/13  . MASTECTOMY Left 1995  . NASAL ENDOSCOPY WITH EPISTAXIS CONTROL Left 07/16/2016   Procedure: ENDOSCOPIC LEFT NASAL CAUTERY;  Surgeon: Leta Baptist, MD;  Location: [place];  Service: ENT;  Laterality: Left;  . TONSILLECTOMY    . TUBAL LIGATION       OB History   None      Home Medications    Prior to Admission medications     Medication Sig Start Date End Date Taking? Authorizing Provider  acetaminophen (TYLENOL) 650 MG CR tablet Take 650 mg by mouth every 8 (eight) hours as needed for pain.   Yes [provider]  albuterol (PROVENTIL HFA;VENTOLIN HFA) 108 (90 Base) MCG/ACT inhaler Inhale 2 puffs into the lungs every 4 (four) hours as needed for wheezing or shortness of breath.   Yes [provider]  amLODipine (NORVASC) 5 MG tablet Take 5 mg by mouth daily.   Yes [provider]  aspirin EC 81 MG tablet Take 81 mg by mouth daily.   Yes [provider]  atorvastatin (LIPITOR) 40 MG tablet Take 40 mg by mouth at bedtime.    Yes [provider]  baclofen (LIORESAL) 10 MG tablet Take 5 mg by mouth 3 (three) times daily as needed for muscle spasms.    Yes [provider]  Benralizumab (FASENRA) 30 MG/ML SOSY Inject 30 mg into the skin every 8 (eight) weeks.    Yes [provider]  budesonide-formoterol (SYMBICORT) 80-4.5 MCG/ACT inhaler Inhale 2 puffs into the lungs 2 (two) times daily. 08/31/16  Yes Tanda Rockers, MD  carvedilol (COREG) 3.125 MG tablet Take 1 tablet (3.125 mg total) by mouth 2 (two) times daily with a meal. 11/22/17  Yes Memon, Jolaine Artist, MD  dicyclomine (BENTYL) 10 MG capsule TAKE 1 CAPSULE BY MOUTH PRIOR TO MEALS AS NEEDED .Marland KitchenMarland KitchenNO MORE THAN 4 TIMES DAILY MAY CAUSE DROWSINESS Patient taking differently: Take 10 mg by mouth 3 (three) times daily before meals.  07/22/18  Yes Annitta Needs, NP  doxazosin (CARDURA) 4 MG tablet Take 4 mg by mouth daily.   Yes [provider]  exenatide (BYETTA 10 MCG PEN) 10 MCG/0.04ML SOLN Inject 10 mcg into the skin 2 (two) times daily with a meal.    Yes [provider]  fluticasone (FLONASE) 50 MCG/ACT nasal spray Place 1 spray into both nostrils daily as needed for allergies or rhinitis.   Yes [provider]  furosemide (LASIX) 20 MG tablet Take 1 tablet (20 mg total) by mouth daily.  11/22/17  Yes Kathie Dike, MD  Insulin Glargine (LANTUS) 100 UNIT/ML Solostar Pen Inject 26 Units into the skin every morning.    Yes [provider]  Insulin Lispro (HUMALOG KWIKPEN [place]) Inject 6 Units into the skin 2 (two) times daily before a meal.    Yes [provider]  magnesium oxide (MAG-OX) 400 MG tablet Take 400 mg by mouth daily.   Yes [provider]  mometasone-formoterol (DULERA) 100-5 MCG/ACT AERO Inhale 2 puffs into the lungs 2 (two) times daily.   Yes [provider]  montelukast (SINGULAIR) 10 MG tablet TAKE ONE TABLET BY MOUTH EVERY NIGHT AT BEDTIME Patient taking differently: Take 10 mg by mouth at bedtime.  11/20/16  Yes Tanda Rockers,  MD  olmesartan (BENICAR) 40 MG tablet Take 40 mg by mouth daily.    Yes [provider]  pantoprazole (PROTONIX) 40 MG tablet TAKE ONE TABLET TWICE DAILY BEFORE A MEAL Patient taking differently: Take 40 mg by mouth 2 (two) times daily before a meal.  02/05/18  Yes Annitta Needs, NP  sertraline (ZOLOFT) 50 MG tablet Take 50 mg by mouth daily.    Yes [provider]  Spacer/Aero-Holding Chambers (AEROCHAMBER MV) inhaler Use as instructed 08/31/16  Yes Tanda Rockers, MD  temazepam (RESTORIL) 15 MG capsule Take 15 mg by mouth at bedtime as needed for sleep.   Yes [provider]  vitamin C (ASCORBIC ACID) 500 MG tablet Take 500 mg by mouth daily.    Yes [provider]  benzonatate (TESSALON) 200 MG capsule TAKE ONE CAPSULE BY MOUTH EVERY 6 HOURS FOR COUGH Patient not taking: No sig reported 08/28/16   Tanda Rockers, MD  EPINEPHrine (EPIPEN 2-PAK) 0.3 mg/0.3 mL IJ SOAJ injection Inject 0.3 mg into the muscle daily as needed (allergic reaction).    [provider]  ipratropium-albuterol (DUONEB) 0.5-2.5 (3) MG/3ML SOLN Take 3 mLs by nebulization every 4 (four) hours. Patient taking differently: Take 3 mLs by nebulization every 6 (six) hours as needed (wheezing and  shortness of breath).  04/10/15   Asencion Noble, MD  LORazepam (ATIVAN) 1 MG tablet Take 1 mg by mouth 2 (two) times daily as needed for anxiety or sleep.     [provider]    Family History Family History  Problem Relation Age of Onset  . Colon cancer Mother   . Heart attack Father   . Heart attack Sister   . Stroke Sister   . Cancer - Lung Sister     Social History Social History   Tobacco Use  . Smoking status: Never Smoker  . Smokeless tobacco: Never Used  Substance Use Topics  . Alcohol use: No  . Drug use: No     Allergies   Codeine; Ceftin [cefuroxime axetil]; Metformin and related; Nexium [esomeprazole magnesium]; Omnicef [cefdinir]; and Sulfa antibiotics   Review of Systems Review of Systems  Constitutional: Negative for appetite change.  HENT: Negative for congestion.   Respiratory: Positive for cough.   Cardiovascular: Positive for chest pain.  Gastrointestinal: Negative for abdominal pain.  Genitourinary: Negative for flank pain.  Musculoskeletal: Negative for back pain.  Neurological: Negative for speech difficulty and weakness.     Physical Exam Updated Vital Signs BP (!) 109/54   Pulse 77   Temp 98.6 F (37 C) (Oral)   Resp 16   Ht 4\' 11"  (1.499 m)   Wt 107 kg   SpO2 92%   BMI 47.67 kg/m   Physical Exam  Constitutional: She appears well-developed.  HENT:  Head: Atraumatic.  Eyes: Pupils are equal, round, and reactive to light.  Neck: Neck supple.  Cardiovascular: Regular rhythm.  Murmur heard. Pulmonary/Chest:  Mild rales at bases.  Musculoskeletal:       Right lower leg: She exhibits edema.       Left lower leg: She exhibits edema.  Mild pitting edema bilateral lower extremity  Neurological: She is alert.  Skin: Skin is warm. Capillary refill takes less than 2 seconds.     ED Treatments / Results  Labs (all labs ordered are listed, but only abnormal results are displayed) Labs Reviewed  BASIC METABOLIC PANEL -  Abnormal; Notable for the following components:  Result Value   Sodium 131 (*)    Chloride 95 (*)    Glucose, Bld 126 (*)    Creatinine, Ser 1.07 (*)    Calcium 8.7 (*)    GFR calc non Af Amer 51 (*)    GFR calc Af Amer 60 (*)    All other components within normal limits  CBC - Abnormal; Notable for the following components:   RBC 3.10 (*)    Hemoglobin 8.8 (*)    HCT 27.7 (*)    All other components within normal limits  TROPONIN I  BRAIN NATRIURETIC PEPTIDE    EKG EKG Interpretation  Date/Time:  Friday August 08 2018 17:23:54 EDT Ventricular Rate:  77 PR Interval:    QRS Duration: 78 QT Interval:  422 QTC Calculation: 478 R Axis:   69 Text Interpretation:  Sinus rhythm Confirmed by Davonna Belling [phone]) on 08/08/2018 5:57:41 PM   Radiology Dg Chest 2 View  Result Date: 08/08/2018 CLINICAL DATA:  Chest pain EXAM: CHEST - 2 VIEW COMPARISON:  04/22/2018, 11/17/2017, CT chest 11/19/2017 FINDINGS: Tiny pleural effusion. Linear atelectasis at the left base. Chronic interstitial opacities. Stable cardiomediastinal silhouette with aortic atherosclerosis. No pneumothorax. Surgical clips in the left axilla. IMPRESSION: No active cardiopulmonary disease. Similar appearance of chronic interstitial opacities. Electronically Signed   By: Donavan Foil M.D.   On: 08/08/2018 18:16    Procedures Procedures (including critical care time)  Medications Ordered in ED Medications  nitroGLYCERIN (NITROSTAT) SL tablet 0.4 mg (0.4 mg Sublingual Given 08/08/18 2004)     Initial Impression / Assessment and Plan / ED Course  I have reviewed the triage vital signs and the nursing notes.  Pertinent labs & imaging results that were available during my care of the patient were reviewed by me and considered in my medical decision making (see chart for details).     Patient with chest pain.  EKG reassuring and enzymes negative.  X-ray reassuring.  However potentially has had some of this  pain with exertion.  I think with cardiac history patient would benefit from admission to the hospital.  Will discuss with hospitalist.  Final Clinical Impressions(s) / ED Diagnoses   Final diagnoses:  Chest pain, unspecified type    ED Discharge Orders    None       Davonna Belling, MD 08/08/18 2008

## 2018-08-08 NOTE — ED Notes (Signed)
ED Provider at bedside. 

## 2018-08-08 NOTE — H&P (Signed)
History and Physical  Ann Fuller RSW:[id number] DOB: 05-04-1946 DOA: 08/08/2018  Referring physician: Dr Alvino Chapel, ED physician PCP: Asencion Noble, MD  Outpatient Specialists:  cardiology: Dr Doneta Public ([place])  Patient Coming From: home  Chief Complaint: Chest pain  HPI: Ann Fuller is a 72 y.o. female with a history of severe persistent asthma, CHF, HTN, GERD, Aortic stenosis.  Patient seen for chest pain that is dull in her mid chest and radiates into her left neck.  She is had several episodes over since yesterday, although medical record shows recent messages on 8/12 to her cardiologist regarding similar type pain.  Pain is worse with exertion and improved with rest.  She was given a dose of sublingual nitroglycerin, which did improve her pain.  No other provoking or palliating factors.  Emergency Department Course: Troponin negative.  Chest x-ray negative.  BNP negative.  Review of Systems:   Pt denies any fevers, chills, nausea, vomiting, diarrhea, constipation, abdominal pain, shortness of breath, dyspnea on exertion, orthopnea, cough, wheezing, palpitations, headache, vision changes, lightheadedness, dizziness, melena, rectal bleeding.  Review of systems are otherwise negative  Past Medical History:  Diagnosis Date  . Adenomatous colon polyp   . Arthritis   . Asthma   . Breast cancer ([place]) [id number]  . Bulging lumbar disc   . CHF (congestive heart failure) ([place])   . Depression   . Diverticulitis   . Dyspepsia   . Essential hypertension, benign   . GERD (gastroesophageal reflux disease)   . Heart murmur   . Hyperlipidemia   . IBS (irritable bowel syndrome)   . Internal hemorrhoid   . Right shoulder pain   . Tubular adenoma 11/2010  . Type 2 diabetes mellitus ([place])    Past Surgical History:  Procedure Laterality Date  . ABDOMINAL HYSTERECTOMY    . APPENDECTOMY    . BACK SURGERY  2007   Lumbar fusion  . CATARACT EXTRACTION W/PHACO Left  08/27/2013   Procedure: CATARACT EXTRACTION PHACO AND INTRAOCULAR LENS PLACEMENT (IOC);  Surgeon: Tonny Branch, MD;  Location: [facility];  Service: Ophthalmology;  Laterality: Left;  CDE 4.25  . CATARACT EXTRACTION W/PHACO Right 09/24/2013   Procedure: CATARACT EXTRACTION PHACO AND INTRAOCULAR LENS PLACEMENT (IOC);  Surgeon: Tonny Branch, MD;  Location: [facility];  Service: Ophthalmology;  Laterality: Right;  CDE:  8.16  . CHOLECYSTECTOMY    . COLONOSCOPY  12/13/2010   Dr. Margarito Courser pancolonic diverticular.tubular adenoma  . COLONOSCOPY  07/13/2003  . COLONOSCOPY N/A 10/28/2015   KXF:GHWEXHB diverticulosis, multiple tubular adenomas removed. next tcs 10/2020.   . ESOPHAGOGASTRODUODENOSCOPY  12/13/2010   Dr. Jennet Maduro hernia, fundal gland type polyps  . EYE SURGERY Left    Left KPE 08/27/13  . MASTECTOMY Left 1995  . NASAL ENDOSCOPY WITH EPISTAXIS CONTROL Left 07/16/2016   Procedure: ENDOSCOPIC LEFT NASAL CAUTERY;  Surgeon: Leta Baptist, MD;  Location: [facility];  Service: ENT;  Laterality: Left;  . TONSILLECTOMY    . TUBAL LIGATION     Social History:  reports that she has never smoked. She has never used smokeless tobacco. She reports that she does not drink alcohol or use drugs. Patient lives at home  Allergies  Allergen Reactions  . Codeine Swelling    Tongue swells  . Ceftin [Cefuroxime Axetil] Swelling  . Metformin And Related Diarrhea  . Nexium [Esomeprazole Magnesium] Diarrhea  . Omnicef [Cefdinir] Diarrhea  . Sulfa Antibiotics Rash    Family History  Problem Relation Age  of Onset  . Colon cancer Mother   . Heart attack Father   . Heart attack Sister   . Stroke Sister   . Cancer - Lung Sister       Prior to Admission medications   Medication Sig Start Date End Date Taking? Authorizing Provider  acetaminophen (TYLENOL) 650 MG CR tablet Take 650 mg by mouth every 8 (eight) hours as needed for pain.   Yes [provider]  albuterol (PROVENTIL HFA;VENTOLIN  HFA) 108 (90 Base) MCG/ACT inhaler Inhale 2 puffs into the lungs every 4 (four) hours as needed for wheezing or shortness of breath.   Yes [provider]  amLODipine (NORVASC) 5 MG tablet Take 5 mg by mouth daily.   Yes [provider]  aspirin EC 81 MG tablet Take 81 mg by mouth daily.   Yes [provider]  atorvastatin (LIPITOR) 40 MG tablet Take 40 mg by mouth at bedtime.    Yes [provider]  baclofen (LIORESAL) 10 MG tablet Take 5 mg by mouth 3 (three) times daily as needed for muscle spasms.    Yes [provider]  Benralizumab (FASENRA) 30 MG/ML SOSY Inject 30 mg into the skin every 8 (eight) weeks.    Yes [provider]  budesonide-formoterol (SYMBICORT) 80-4.5 MCG/ACT inhaler Inhale 2 puffs into the lungs 2 (two) times daily. 08/31/16  Yes Tanda Rockers, MD  carvedilol (COREG) 3.125 MG tablet Take 1 tablet (3.125 mg total) by mouth 2 (two) times daily with a meal. 11/22/17  Yes Memon, Jolaine Artist, MD  dicyclomine (BENTYL) 10 MG capsule TAKE 1 CAPSULE BY MOUTH PRIOR TO MEALS AS NEEDED .Marland KitchenMarland KitchenNO MORE THAN 4 TIMES DAILY MAY CAUSE DROWSINESS Patient taking differently: Take 10 mg by mouth 3 (three) times daily before meals.  07/22/18  Yes Annitta Needs, NP  doxazosin (CARDURA) 4 MG tablet Take 4 mg by mouth daily.   Yes [provider]  exenatide (BYETTA 10 MCG PEN) 10 MCG/0.04ML SOLN Inject 10 mcg into the skin 2 (two) times daily with a meal.    Yes [provider]  fluticasone (FLONASE) 50 MCG/ACT nasal spray Place 1 spray into both nostrils daily as needed for allergies or rhinitis.   Yes [provider]  furosemide (LASIX) 20 MG tablet Take 1 tablet (20 mg total) by mouth daily. 11/22/17  Yes Kathie Dike, MD  Insulin Glargine (LANTUS) 100 UNIT/ML Solostar Pen Inject 26 Units into the skin every morning.    Yes [provider]  Insulin Lispro (HUMALOG KWIKPEN [place]) Inject 6 Units into the skin 2 (two)  times daily before a meal.    Yes [provider]  magnesium oxide (MAG-OX) 400 MG tablet Take 400 mg by mouth daily.   Yes [provider]  mometasone-formoterol (DULERA) 100-5 MCG/ACT AERO Inhale 2 puffs into the lungs 2 (two) times daily.   Yes [provider]  montelukast (SINGULAIR) 10 MG tablet TAKE ONE TABLET BY MOUTH EVERY NIGHT AT BEDTIME Patient taking differently: Take 10 mg by mouth at bedtime.  11/20/16  Yes Tanda Rockers, MD  olmesartan (BENICAR) 40 MG tablet Take 40 mg by mouth daily.    Yes [provider]  pantoprazole (PROTONIX) 40 MG tablet TAKE ONE TABLET TWICE DAILY BEFORE A MEAL Patient taking differently: Take 40 mg by mouth 2 (two) times daily before a meal.  02/05/18  Yes Annitta Needs, NP  sertraline (ZOLOFT) 50 MG tablet Take 50 mg by  mouth daily.    Yes [provider]  Spacer/Aero-Holding Chambers (AEROCHAMBER MV) inhaler Use as instructed 08/31/16  Yes Tanda Rockers, MD  temazepam (RESTORIL) 15 MG capsule Take 15 mg by mouth at bedtime as needed for sleep.   Yes [provider]  vitamin C (ASCORBIC ACID) 500 MG tablet Take 500 mg by mouth daily.    Yes [provider]  EPINEPHrine (EPIPEN 2-PAK) 0.3 mg/0.3 mL IJ SOAJ injection Inject 0.3 mg into the muscle daily as needed (allergic reaction).    [provider]  ipratropium-albuterol (DUONEB) 0.5-2.5 (3) MG/3ML SOLN Take 3 mLs by nebulization every 4 (four) hours. Patient taking differently: Take 3 mLs by nebulization every 6 (six) hours as needed (wheezing and shortness of breath).  04/10/15   Asencion Noble, MD  LORazepam (ATIVAN) 1 MG tablet Take 1 mg by mouth 2 (two) times daily as needed for anxiety or sleep.     [provider]    Physical Exam: BP (!) 109/54   Pulse 77   Temp 98.6 F (37 C) (Oral)   Resp 16   Ht 4\' 11"  (1.499 m)   Wt 107 kg   SpO2 92%   BMI 47.67 kg/m   . General: Elderly female. Awake and alert and oriented  x3. No acute cardiopulmonary distress.  Marland Kitchen HEENT: Normocephalic atraumatic.  Right and left ears normal in appearance.  Pupils equal, round, reactive to light. Extraocular muscles are intact. Sclerae anicteric and noninjected.  Moist mucosal membranes. No mucosal lesions.  . Neck: Neck supple without lymphadenopathy. No carotid bruits. No masses palpated.  . Cardiovascular: Regular rate with normal S1-S2 sounds. No murmurs, rubs, gallops auscultated. No JVD.  Marland Kitchen Respiratory: Good respiratory effort with no wheezes, rales, rhonchi. Lungs clear to auscultation bilaterally.  No accessory muscle use. . Abdomen: Soft, nontender, nondistended. Active bowel sounds. No masses or hepatosplenomegaly  . Skin: No rashes, lesions, or ulcerations.  Dry, warm to touch. 2+ dorsalis pedis and radial pulses. . Musculoskeletal: No calf or leg pain. All major joints not erythematous nontender.  No upper or lower joint deformation.  Good ROM.  No contractures  . Psychiatric: Intact judgment and insight. Pleasant and cooperative. . Neurologic: No focal neurological deficits. Strength is 5/5 and symmetric in upper and lower extremities.  Cranial nerves II through XII are grossly intact.           Labs on Admission: I have personally reviewed following labs and imaging studies  CBC: Recent Labs  Lab 08/08/18 1729  WBC 7.4  HGB 8.8*  HCT 27.7*  MCV 89.4  PLT 119   Basic Metabolic Panel: Recent Labs  Lab 08/08/18 1729  NA 131*  K 3.8  CL 95*  CO2 28  GLUCOSE 126*  BUN 21  CREATININE 1.07*  CALCIUM 8.7*   GFR: Estimated Creatinine Clearance: 53.1 mL/min (A) (by C-G formula based on SCr of 1.07 mg/dL (H)). Liver Function Tests: No results for input(s): AST, ALT, ALKPHOS, BILITOT, PROT, ALBUMIN in the last 168 hours. No results for input(s): LIPASE, AMYLASE in the last 168 hours. No results for input(s): AMMONIA in the last 168 hours. Coagulation Profile: No results for input(s): INR, PROTIME in the  last 168 hours. Cardiac Enzymes: Recent Labs  Lab 08/08/18 1729  TROPONINI <0.03   BNP (last 3 results) No results for input(s): PROBNP in the last 8760 hours. HbA1C: No results for input(s): HGBA1C in the last 72 hours. CBG: No results for input(s):  GLUCAP in the last 168 hours. Lipid Profile: No results for input(s): CHOL, HDL, LDLCALC, TRIG, CHOLHDL, LDLDIRECT in the last 72 hours. Thyroid Function Tests: No results for input(s): TSH, T4TOTAL, FREET4, T3FREE, THYROIDAB in the last 72 hours. Anemia Panel: No results for input(s): VITAMINB12, FOLATE, FERRITIN, TIBC, IRON, RETICCTPCT in the last 72 hours. Urine analysis:    Component Value Date/Time   COLORURINE YELLOW 04/06/2015 1935   APPEARANCEUR CLEAR 04/06/2015 1935   LABSPEC 1.015 04/06/2015 1935   PHURINE 5.5 04/06/2015 1935   GLUCOSEU NEGATIVE 04/06/2015 1935   GLUCOSEU NEG mg/dL 11/21/2010 1308   HGBUR TRACE (A) 04/06/2015 1935   BILIRUBINUR NEGATIVE 04/06/2015 1935   KETONESUR NEGATIVE 04/06/2015 1935   PROTEINUR NEGATIVE 04/06/2015 1935   UROBILINOGEN 0.2 04/06/2015 1935   NITRITE NEGATIVE 04/06/2015 1935   LEUKOCYTESUR NEGATIVE 04/06/2015 1935   Sepsis Labs: @LABRCNTIP (procalcitonin:4,lacticidven:4) )No results found for this or any previous visit (from the past 240 hour(s)).   Radiological Exams on Admission: Dg Chest 2 View  Result Date: 08/08/2018 CLINICAL DATA:  Chest pain EXAM: CHEST - 2 VIEW COMPARISON:  04/22/2018, 11/17/2017, CT chest 11/19/2017 FINDINGS: Tiny pleural effusion. Linear atelectasis at the left base. Chronic interstitial opacities. Stable cardiomediastinal silhouette with aortic atherosclerosis. No pneumothorax. Surgical clips in the left axilla. IMPRESSION: No active cardiopulmonary disease. Similar appearance of chronic interstitial opacities. Electronically Signed   By: Donavan Foil M.D.   On: 08/08/2018 18:16    EKG: Independently reviewed.  Sinus rhythm.  No acute ST  changes.  Assessment/Plan: Principal Problem:   Chest pain Active Problems:   Gastroesophageal reflux disease without esophagitis   Type 2 diabetes mellitus (HCC)   Essential hypertension, benign   Diastolic dysfunction   Aortic valve stenosis   Severe persistent asthma without complication    This patient was discussed with the ED physician, including pertinent vitals, physical exam findings, labs, and imaging.  We also discussed care given by the ED provider.  1. Chest pain a. Telemetry monitoring b. Overnight observation c. Serial troponins 2. GERD a. Continue home medications 3. Type 2 diabetes a. Continue home regimen of insulin plus sliding scale insulin 4. Hypertension a. Continue home antihypertensives 5. Diastolic dysfunction a. Compensated 6. Aortic valve stenosis a. No evidence of systemic overload 7. Severe persistent asthma a. Likely etiology of patient's chest pain b. Continue Dulera and other home pulmonary treatments  DVT prophylaxis: Lovenox Consultants: None Code Status: Full code Family Communication: Sister in Sports coach and daughter present  Disposition Plan: return home following evaluation   Truett Mainland, DO [facility] Pager [phone]  If 7PM-7AM, please contact night-coverage [email or web address] Password [email or web address]

## 2018-08-08 NOTE — ED Triage Notes (Signed)
Patient complaining of intermittent chest "pressure" since 0300 this morning. States pain radiates into left jaw and back.

## 2018-08-09 DIAGNOSIS — R079 Chest pain, unspecified: Secondary | ICD-10-CM | POA: Diagnosis not present

## 2018-08-09 DIAGNOSIS — I10 Essential (primary) hypertension: Secondary | ICD-10-CM | POA: Diagnosis not present

## 2018-08-09 DIAGNOSIS — I5189 Other ill-defined heart diseases: Secondary | ICD-10-CM | POA: Diagnosis not present

## 2018-08-09 DIAGNOSIS — I350 Nonrheumatic aortic (valve) stenosis: Secondary | ICD-10-CM | POA: Diagnosis not present

## 2018-08-09 LAB — GLUCOSE, CAPILLARY: Glucose-Capillary: 109 mg/dL — ABNORMAL HIGH (ref 70–99)

## 2018-08-09 LAB — TROPONIN I

## 2018-08-09 NOTE — Progress Notes (Signed)
Patient discharged with personal belongings, IV removed and site intact. Patient discharged with AVS and verbalizes understanding.

## 2018-08-09 NOTE — Discharge Summary (Signed)
Physician Discharge Summary  Ann Fuller NUU:[id number] DOB: 02-02-1946 DOA: 08/08/2018  PCP: Asencion Noble, MD  Admit date: 08/08/2018 Discharge date: 08/09/2018  Admitted From: Home Disposition:  Home   Recommendations for Outpatient Follow-up:  1. Follow up with PCP in 1-2 weeks 2. Please obtain BMP/CBC in one week    Discharge Condition: Stable CODE STATUS: FULL Diet recommendation: Heart Healthy / Carb Modified    Brief/Interim Summary: 72 year old female with a [facility],eosinophilic asthma, hypertension, diabetes mellitus type 2, hyperlipidemia, depression, and breast cancer in remission presented with substernal chest pain radiating to neck that work her up on morning of 08/08/18.  She states she has been having intermittent episodes of chest discomfort in the past week.  However, the patient endorsed that she has not been extremely compliant with her medications including her antihypertensive medications.  She denies any dizziness, syncope, nausea, vomiting or diarrhea.  She has had a nonproductive cough but denies any hemoptysis.  She states that her breathing has been as good as usual.  She denies any fevers, chills, sick contacts.  She contacted her cardiologist whom advised her to go to the emergency department. EKG at the time of admission showed sinus rhythm with nonspecific T wave changes.  The patient's troponins were negative x3.  The patient remained afebrile hemodynamically stable throughout the hospitalization saturating 96-90% on room air.  I offered the patient additional work-up including echocardiogram.  However the patient did not want any further work-up at this time, and preferred to follow-up with her cardiologist as an outpatient.  As the patient was asymptomatic and clinically improved and without any objective evidence of ACS or angina, the patient will be discharged with close follow-up with her  cardiologist.  Discharge Diagnoses:  Atypical chest pain -atypical by clinical history -likely precipitated by pt noncompliance with her medications including but not limited to coreg, amlodipine, ARB -troponins neg x 3 -I offer further work up including echo and possible cardiology consult -the pt preferred outpt work up and follow up with her outpt cardiologist  chronic diastolic CHF -The patient appears euvolemic on exam -BNP 78 -personally reviewed CXR--interstitial prominence--appears chronic -08/27/2017 echo EF 60-65%, moderate left ear, trace TR, mild MR. -Continue carvedilol  Moderate Aortic Stenosis -08/27/2017 echo EF 60-65%, moderate left ear, trace TR, mild MR. -[facility]  Eosinophilic Asthma -stable on RA -continue bronchodilators, singulair -continue benralizumab -f/u out pt pulmonlogy  Diabetes mellitus type 2 -02/19/18-Hemoglobin A1c--6.2 -NovoLog sliding scale -Continue Lantus home dose  Essential hypertension -Continue coreg, amlodipine and ARB  Anxiety/depression -Continue Zoloft  Hyperlipidemia -Continue statin  Morbid Obesity -BMI 47.84 -lifestyle modification   Discharge Instructions   Allergies as of 08/09/2018      Reactions   Codeine Swelling   Tongue swells   Ceftin [cefuroxime Axetil] Swelling   Metformin And Related Diarrhea   Nexium [esomeprazole Magnesium] Diarrhea   Omnicef [cefdinir] Diarrhea   Sulfa Antibiotics Rash      Medication List    TAKE these medications   acetaminophen 650 MG CR tablet Commonly known as:  TYLENOL Take 650 mg by mouth every 8 (eight) hours as needed for pain.   AEROCHAMBER MV inhaler Use as instructed   albuterol 108 (90 Base) MCG/ACT inhaler Commonly known as:  PROVENTIL HFA;VENTOLIN HFA Inhale 2 puffs into the lungs every 4 (four) hours as needed for wheezing or shortness of breath.   amLODipine 5 MG tablet Commonly known as:  NORVASC Take 5 mg by mouth daily.    aspirin EC 81 MG tablet Take 81 mg by mouth daily.   baclofen 10 MG tablet Commonly known as:  LIORESAL Take 5 mg by mouth 3 (three) times daily as needed for muscle spasms.   BENICAR 40 MG tablet Generic drug:  olmesartan Take 40 mg by mouth daily.   budesonide-formoterol 80-4.5 MCG/ACT inhaler Commonly known as:  SYMBICORT Inhale 2 puffs into the lungs 2 (two) times daily.   BYETTA 10 MCG PEN 10 MCG/0.04ML Sopn injection Generic drug:  exenatide Inject 10 mcg into the skin 2 (two) times daily with a meal.   carvedilol 3.125 MG tablet Commonly known as:  COREG Take 1 tablet (3.125 mg total) by mouth 2 (two) times daily with a meal.   dicyclomine 10 MG capsule Commonly known as:  BENTYL TAKE 1 CAPSULE BY MOUTH PRIOR TO MEALS AS NEEDED .Marland KitchenMarland KitchenNO MORE THAN 4 TIMES DAILY MAY CAUSE DROWSINESS What changed:  See the new instructions.   doxazosin 4 MG tablet Commonly known as:  CARDURA Take 4 mg by mouth daily.   EPIPEN 2-PAK 0.3 mg/0.3 mL [place] injection Generic drug:  EPINEPHrine Inject 0.3 mg into the muscle daily as needed (allergic reaction).   FASENRA 30 MG/ML Sosy Generic drug:  Benralizumab Inject 30 mg into the skin every 8 (eight) weeks.   fluticasone 50 MCG/ACT nasal spray Commonly known as:  FLONASE Place 1 spray into both nostrils daily as needed for allergies or rhinitis.   furosemide 20 MG tablet Commonly known as:  LASIX Take 1 tablet (20 mg total) by mouth daily.   HUMALOG KWIKPEN [place] Inject 6 Units into the skin 2 (two) times daily before a meal.   Insulin Glargine 100 UNIT/ML Solostar Pen Commonly known as:  LANTUS Inject 26 Units into the skin every morning.   ipratropium-albuterol 0.5-2.5 (3) MG/3ML Soln Commonly known as:  DUONEB Take 3 mLs by nebulization every 4 (four) hours. What changed:    when to take this  reasons to take this   LIPITOR 40 MG tablet Generic drug:  atorvastatin Take 40 mg by mouth at bedtime.   LORazepam 1 MG  tablet Commonly known as:  ATIVAN Take 1 mg by mouth 2 (two) times daily as needed for anxiety or sleep.   magnesium oxide 400 MG tablet Commonly known as:  MAG-OX Take 400 mg by mouth daily.   mometasone-formoterol 100-5 MCG/ACT Aero Commonly known as:  DULERA Inhale 2 puffs into the lungs 2 (two) times daily.   montelukast 10 MG tablet Commonly known as:  SINGULAIR TAKE ONE TABLET BY MOUTH EVERY NIGHT AT BEDTIME   pantoprazole 40 MG tablet Commonly known as:  PROTONIX TAKE ONE TABLET TWICE DAILY BEFORE A MEAL What changed:  See the new instructions.   sertraline 50 MG tablet Commonly known as:  ZOLOFT Take 50 mg by mouth daily.   temazepam 15 MG capsule Commonly known as:  RESTORIL Take 15 mg by mouth at bedtime as needed for sleep.   vitamin C 500 MG tablet Commonly known as:  ASCORBIC ACID Take 500 mg by mouth daily.       Allergies  Allergen Reactions  . Codeine Swelling    Tongue swells  . Ceftin [Cefuroxime Axetil] Swelling  . Metformin And Related Diarrhea  . Nexium [Esomeprazole Magnesium] Diarrhea  . Omnicef [Cefdinir] Diarrhea  . Sulfa Antibiotics Rash    Consultations:  none   Procedures/Studies: Dg Chest 2 View  Result Date: 08/08/2018 CLINICAL DATA:  Chest pain EXAM: CHEST - 2 VIEW COMPARISON:  04/22/2018, 11/17/2017, CT chest 11/19/2017 FINDINGS: Tiny pleural effusion. Linear atelectasis at the left base. Chronic interstitial opacities. Stable cardiomediastinal silhouette with aortic atherosclerosis. No pneumothorax. Surgical clips in the left axilla. IMPRESSION: No active cardiopulmonary disease. Similar appearance of chronic interstitial opacities. Electronically Signed   By: Donavan Foil M.D.   On: 08/08/2018 18:16        Discharge Exam: Vitals:   08/09/18 0327 08/09/18 0730  BP: (!) 110/47 (!) 115/45  Pulse: 70 76  Resp: 17 16  Temp: 98.1 F (36.7 C) 98.6 F (37 C)  SpO2: 94% 95%   Vitals:   08/08/18 2140 08/09/18 0222  08/09/18 0327 08/09/18 0730  BP: (!) 166/88  (!) 110/47 (!) 115/45  Pulse: 78  70 76  Resp: 16  17 16   Temp: 97.8 F (36.6 C)  98.1 F (36.7 C) 98.6 F (37 C)  TempSrc: Oral  Oral Oral  SpO2: 94% 98% 94% 95%  Weight:   110.5 kg   Height:   4\' 11"  (1.499 m)     General: Pt is alert, awake, not in acute distress Cardiovascular: RRR, S1/S2 +, no rubs, no gallops Respiratory: CTA bilaterally, no wheezing, no rhonchi Abdominal: Soft, NT, ND, bowel sounds + Extremities: no edema, no cyanosis   The results of significant diagnostics from this hospitalization (including imaging, microbiology, ancillary and laboratory) are listed below for reference.    Significant Diagnostic Studies: Dg Chest 2 View  Result Date: 08/08/2018 CLINICAL DATA:  Chest pain EXAM: CHEST - 2 VIEW COMPARISON:  04/22/2018, 11/17/2017, CT chest 11/19/2017 FINDINGS: Tiny pleural effusion. Linear atelectasis at the left base. Chronic interstitial opacities. Stable cardiomediastinal silhouette with aortic atherosclerosis. No pneumothorax. Surgical clips in the left axilla. IMPRESSION: No active cardiopulmonary disease. Similar appearance of chronic interstitial opacities. Electronically Signed   By: Donavan Foil M.D.   On: 08/08/2018 18:16     Microbiology: No results found for this or any previous visit (from the past 240 hour(s)).   Labs: Basic Metabolic Panel: Recent Labs  Lab 08/08/18 1729  NA 131*  K 3.8  CL 95*  CO2 28  GLUCOSE 126*  BUN 21  CREATININE 1.07*  CALCIUM 8.7*   Liver Function Tests: No results for input(s): AST, ALT, ALKPHOS, BILITOT, PROT, ALBUMIN in the last 168 hours. No results for input(s): LIPASE, AMYLASE in the last 168 hours. No results for input(s): AMMONIA in the last 168 hours. CBC: Recent Labs  Lab 08/08/18 1729  WBC 7.4  HGB 8.8*  HCT 27.7*  MCV 89.4  PLT 249   Cardiac Enzymes: Recent Labs  Lab 08/08/18 1729 08/08/18 2052 08/09/18 0237  TROPONINI <0.03  <0.03 <0.03   BNP: Invalid input(s): POCBNP CBG: Recent Labs  Lab 08/08/18 2333 08/09/18 0733  GLUCAP 166* 109*    Time coordinating discharge:  36 minutes  Signed:  Orson Eva, DO [facility] Pager: [phone] 08/09/2018, 8:25 AM

## 2018-08-10 LAB — HIV ANTIBODY (ROUTINE TESTING W REFLEX): HIV Screen 4th Generation wRfx: NONREACTIVE

## 2018-08-11 ENCOUNTER — Encounter (HOSPITAL_COMMUNITY): Payer: Self-pay | Admitting: Emergency Medicine

## 2018-08-11 ENCOUNTER — Emergency Department (HOSPITAL_COMMUNITY): Payer: Medicare Other

## 2018-08-11 ENCOUNTER — Other Ambulatory Visit: Payer: Self-pay

## 2018-08-11 ENCOUNTER — Inpatient Hospital Stay (HOSPITAL_COMMUNITY)
Admission: EM | Admit: 2018-08-11 | Discharge: 2018-08-15 | DRG: 189 | Disposition: A | Discharge status: Home / Self Care | Payer: Medicare Other | Attending: Internal Medicine | Admitting: Internal Medicine

## 2018-08-11 DIAGNOSIS — E785 Hyperlipidemia, unspecified: Secondary | ICD-10-CM | POA: Diagnosis present

## 2018-08-11 DIAGNOSIS — Z9012 Acquired absence of left breast and nipple: Secondary | ICD-10-CM | POA: Diagnosis not present

## 2018-08-11 DIAGNOSIS — Z79899 Other long term (current) drug therapy: Secondary | ICD-10-CM

## 2018-08-11 DIAGNOSIS — Z882 Allergy status to sulfonamides status: Secondary | ICD-10-CM | POA: Diagnosis not present

## 2018-08-11 DIAGNOSIS — I110 Hypertensive heart disease with heart failure: Secondary | ICD-10-CM | POA: Diagnosis present

## 2018-08-11 DIAGNOSIS — Z853 Personal history of malignant neoplasm of breast: Secondary | ICD-10-CM | POA: Diagnosis not present

## 2018-08-11 DIAGNOSIS — E119 Type 2 diabetes mellitus without complications: Secondary | ICD-10-CM | POA: Diagnosis not present

## 2018-08-11 DIAGNOSIS — F329 Major depressive disorder, single episode, unspecified: Secondary | ICD-10-CM | POA: Diagnosis present

## 2018-08-11 DIAGNOSIS — I509 Heart failure, unspecified: Secondary | ICD-10-CM | POA: Insufficient documentation

## 2018-08-11 DIAGNOSIS — I10 Essential (primary) hypertension: Secondary | ICD-10-CM | POA: Diagnosis present

## 2018-08-11 DIAGNOSIS — Z794 Long term (current) use of insulin: Secondary | ICD-10-CM | POA: Diagnosis not present

## 2018-08-11 DIAGNOSIS — R55 Syncope and collapse: Secondary | ICD-10-CM | POA: Diagnosis present

## 2018-08-11 DIAGNOSIS — F419 Anxiety disorder, unspecified: Secondary | ICD-10-CM | POA: Diagnosis present

## 2018-08-11 DIAGNOSIS — R0600 Dyspnea, unspecified: Secondary | ICD-10-CM

## 2018-08-11 DIAGNOSIS — Z90710 Acquired absence of both cervix and uterus: Secondary | ICD-10-CM

## 2018-08-11 DIAGNOSIS — Z6841 Body Mass Index (BMI) 40.0 and over, adult: Secondary | ICD-10-CM

## 2018-08-11 DIAGNOSIS — Z9842 Cataract extraction status, left eye: Secondary | ICD-10-CM

## 2018-08-11 DIAGNOSIS — Z888 Allergy status to other drugs, medicaments and biological substances status: Secondary | ICD-10-CM | POA: Diagnosis not present

## 2018-08-11 DIAGNOSIS — J82 Pulmonary eosinophilia, not elsewhere classified: Secondary | ICD-10-CM | POA: Diagnosis present

## 2018-08-11 DIAGNOSIS — I350 Nonrheumatic aortic (valve) stenosis: Secondary | ICD-10-CM | POA: Diagnosis present

## 2018-08-11 DIAGNOSIS — Z7982 Long term (current) use of aspirin: Secondary | ICD-10-CM

## 2018-08-11 DIAGNOSIS — J9601 Acute respiratory failure with hypoxia: Principal | ICD-10-CM | POA: Diagnosis present

## 2018-08-11 DIAGNOSIS — Z9841 Cataract extraction status, right eye: Secondary | ICD-10-CM

## 2018-08-11 DIAGNOSIS — Z885 Allergy status to narcotic agent status: Secondary | ICD-10-CM

## 2018-08-11 DIAGNOSIS — Z9882 Breast implant status: Secondary | ICD-10-CM

## 2018-08-11 DIAGNOSIS — I5033 Acute on chronic diastolic (congestive) heart failure: Secondary | ICD-10-CM | POA: Diagnosis present

## 2018-08-11 DIAGNOSIS — D649 Anemia, unspecified: Secondary | ICD-10-CM | POA: Diagnosis present

## 2018-08-11 DIAGNOSIS — Z961 Presence of intraocular lens: Secondary | ICD-10-CM | POA: Diagnosis present

## 2018-08-11 DIAGNOSIS — I351 Nonrheumatic aortic (valve) insufficiency: Secondary | ICD-10-CM | POA: Diagnosis not present

## 2018-08-11 LAB — COMPREHENSIVE METABOLIC PANEL
ALK PHOS: 54 U/L (ref 38–126)
ALT: 13 U/L (ref 0–44)
AST: 14 U/L — AB (ref 15–41)
Albumin: 3.4 g/dL — ABNORMAL LOW (ref 3.5–5.0)
Anion gap: 10 (ref 5–15)
BUN: 15 mg/dL (ref 8–23)
CALCIUM: 9.2 mg/dL (ref 8.9–10.3)
CHLORIDE: 98 mmol/L (ref 98–111)
CO2: 28 mmol/L (ref 22–32)
CREATININE: 0.97 mg/dL (ref 0.44–1.00)
GFR, EST NON AFRICAN AMERICAN: 58 mL/min — AB (ref >60)
Glucose, Bld: 102 mg/dL — ABNORMAL HIGH (ref 70–99)
Potassium: 4.1 mmol/L (ref 3.5–5.1)
Sodium: 136 mmol/L (ref 135–145)
Total Bilirubin: 0.9 mg/dL (ref 0.3–1.2)
Total Protein: 7.3 g/dL (ref 6.5–8.1)

## 2018-08-11 LAB — CBC WITH DIFFERENTIAL/PLATELET
Abs Immature Granulocytes: 0 10*3/uL (ref 0.0–0.1)
BASOS ABS: 0 10*3/uL (ref 0.0–0.1)
Basophils Relative: 0 %
EOS ABS: 0 10*3/uL (ref 0.0–0.7)
EOS PCT: 0 %
HCT: 27.9 % — ABNORMAL LOW (ref 36.0–46.0)
HEMOGLOBIN: 8.8 g/dL — AB (ref 12.0–15.0)
Immature Granulocytes: 0 %
LYMPHS ABS: 0.9 10*3/uL (ref 0.7–4.0)
LYMPHS PCT: 7 %
MCH: 28.6 pg (ref 26.0–34.0)
MCHC: 31.5 g/dL (ref 30.0–36.0)
MCV: 90.6 fL (ref 78.0–100.0)
Monocytes Absolute: 1.5 10*3/uL — ABNORMAL HIGH (ref 0.1–1.0)
Monocytes Relative: 13 %
Neutro Abs: 9.4 10*3/uL — ABNORMAL HIGH (ref 1.7–7.7)
Neutrophils Relative %: 80 %
Platelets: 272 10*3/uL (ref 150–400)
RBC: 3.08 MIL/uL — ABNORMAL LOW (ref 3.87–5.11)
RDW: 14.5 % (ref 11.5–15.5)
WBC: 11.8 10*3/uL — ABNORMAL HIGH (ref 4.0–10.5)

## 2018-08-11 LAB — TROPONIN I
Troponin I: <0.03 ng/mL (ref <0.03)
Troponin I: <0.03 ng/mL (ref <0.03)

## 2018-08-11 LAB — BASIC METABOLIC PANEL
Anion gap: 11 (ref 5–15)
BUN: 16 mg/dL (ref 8–23)
CO2: 27 mmol/L (ref 22–32)
CREATININE: 0.97 mg/dL (ref 0.44–1.00)
Calcium: 9.3 mg/dL (ref 8.9–10.3)
Chloride: 96 mmol/L — ABNORMAL LOW (ref 98–111)
GFR calc non Af Amer: 58 mL/min — ABNORMAL LOW (ref >60)
Glucose, Bld: 82 mg/dL (ref 70–99)
Potassium: 4.3 mmol/L (ref 3.5–5.1)
SODIUM: 134 mmol/L — AB (ref 135–145)

## 2018-08-11 LAB — CBC
HCT: 29.3 % — ABNORMAL LOW (ref 36.0–46.0)
Hemoglobin: 9 g/dL — ABNORMAL LOW (ref 12.0–15.0)
MCH: 28.1 pg (ref 26.0–34.0)
MCHC: 30.7 g/dL (ref 30.0–36.0)
MCV: 91.6 fL (ref 78.0–100.0)
PLATELETS: 245 10*3/uL (ref 150–400)
RBC: 3.2 MIL/uL — AB (ref 3.87–5.11)
RDW: 14.5 % (ref 11.5–15.5)
WBC: 11.5 10*3/uL — AB (ref 4.0–10.5)

## 2018-08-11 LAB — MAGNESIUM: MAGNESIUM: 1.8 mg/dL (ref 1.7–2.4)

## 2018-08-11 LAB — GLUCOSE, CAPILLARY
GLUCOSE-CAPILLARY: 132 mg/dL — AB (ref 70–99)
Glucose-Capillary: 109 mg/dL — ABNORMAL HIGH (ref 70–99)

## 2018-08-11 LAB — CBG MONITORING, ED
GLUCOSE-CAPILLARY: 112 mg/dL — AB (ref 70–99)
Glucose-Capillary: 108 mg/dL — ABNORMAL HIGH (ref 70–99)

## 2018-08-11 LAB — TSH: TSH: 2.135 u[IU]/mL (ref 0.350–4.500)

## 2018-08-11 LAB — I-STAT TROPONIN, ED: TROPONIN I, POC: 0.01 ng/mL (ref 0.00–0.08)

## 2018-08-11 LAB — BRAIN NATRIURETIC PEPTIDE: B Natriuretic Peptide: 110.3 pg/mL — ABNORMAL HIGH (ref 0.0–100.0)

## 2018-08-11 MED ORDER — MOMETASONE FURO-FORMOTEROL FUM 100-5 MCG/ACT IN AERO
2.0000 | INHALATION_SPRAY | Freq: Two times a day (BID) | RESPIRATORY_TRACT | Status: DC
  Administered 2018-08-11 – 2018-08-15 (×9): 2 via RESPIRATORY_TRACT
  Filled 2018-08-11 (×2): qty 8.8

## 2018-08-11 MED ORDER — CARVEDILOL 3.125 MG PO TABS
3.1250 mg | ORAL_TABLET | Freq: Two times a day (BID) | ORAL | Status: DC
  Administered 2018-08-11 – 2018-08-15 (×9): 3.125 mg via ORAL
  Filled 2018-08-11 (×10): qty 1

## 2018-08-11 MED ORDER — ALBUTEROL SULFATE HFA 108 (90 BASE) MCG/ACT IN AERS: 2.0000 | INHALATION_SPRAY | RESPIRATORY_TRACT | Status: DC | PRN

## 2018-08-11 MED ORDER — EXENATIDE 10 MCG/0.04ML ~~LOC~~ SOPN
10.0000 ug | PEN_INJECTOR | Freq: Two times a day (BID) | SUBCUTANEOUS | Status: DC
  Administered 2018-08-11 – 2018-08-15 (×8): 10 ug via SUBCUTANEOUS
  Filled 2018-08-11 (×3): qty 2.4

## 2018-08-11 MED ORDER — INSULIN GLARGINE 100 UNIT/ML ~~LOC~~ SOLN
26.0000 [IU] | Freq: Every morning | SUBCUTANEOUS | Status: DC
  Administered 2018-08-11 – 2018-08-15 (×5): 26 [IU] via SUBCUTANEOUS
  Filled 2018-08-11 (×6): qty 0.26

## 2018-08-11 MED ORDER — AMLODIPINE BESYLATE 5 MG PO TABS
5.0000 mg | ORAL_TABLET | Freq: Every day | ORAL | Status: DC
  Administered 2018-08-11: 5 mg via ORAL
  Filled 2018-08-11 (×2): qty 1

## 2018-08-11 MED ORDER — MAGNESIUM OXIDE 400 (241.3 MG) MG PO TABS
400.0000 mg | ORAL_TABLET | Freq: Every day | ORAL | Status: DC
  Administered 2018-08-11 – 2018-08-15 (×5): 400 mg via ORAL
  Filled 2018-08-11 (×5): qty 1

## 2018-08-11 MED ORDER — SERTRALINE HCL 50 MG PO TABS
50.0000 mg | ORAL_TABLET | Freq: Every day | ORAL | Status: DC
  Administered 2018-08-11 – 2018-08-15 (×5): 50 mg via ORAL
  Filled 2018-08-11 (×5): qty 1

## 2018-08-11 MED ORDER — IPRATROPIUM-ALBUTEROL 0.5-2.5 (3) MG/3ML IN SOLN: 3.0000 mL | Freq: Four times a day (QID) | RESPIRATORY_TRACT | Status: DC | PRN

## 2018-08-11 MED ORDER — ACETAMINOPHEN 325 MG PO TABS
650.0000 mg | ORAL_TABLET | Freq: Four times a day (QID) | ORAL | Status: DC | PRN
  Administered 2018-08-11 – 2018-08-14 (×4): 650 mg via ORAL
  Filled 2018-08-11 (×4): qty 2

## 2018-08-11 MED ORDER — PANTOPRAZOLE SODIUM 40 MG PO TBEC
40.0000 mg | DELAYED_RELEASE_TABLET | Freq: Two times a day (BID) | ORAL | Status: DC
  Administered 2018-08-11 – 2018-08-15 (×9): 40 mg via ORAL
  Filled 2018-08-11 (×9): qty 1

## 2018-08-11 MED ORDER — FUROSEMIDE 10 MG/ML IJ SOLN
40.0000 mg | INTRAMUSCULAR | Status: AC
  Administered 2018-08-11: 40 mg via INTRAVENOUS
  Filled 2018-08-11: qty 4

## 2018-08-11 MED ORDER — FUROSEMIDE 10 MG/ML IJ SOLN
40.0000 mg | Freq: Two times a day (BID) | INTRAMUSCULAR | Status: DC
  Administered 2018-08-11 – 2018-08-12 (×4): 40 mg via INTRAVENOUS
  Filled 2018-08-11 (×4): qty 4

## 2018-08-11 MED ORDER — BACLOFEN 5 MG HALF TABLET
5.0000 mg | ORAL_TABLET | Freq: Three times a day (TID) | ORAL | Status: DC | PRN
  Administered 2018-08-11: 5 mg via ORAL
  Filled 2018-08-11: qty 1

## 2018-08-11 MED ORDER — TEMAZEPAM 15 MG PO CAPS
15.0000 mg | ORAL_CAPSULE | Freq: Every evening | ORAL | Status: DC | PRN
  Administered 2018-08-13 – 2018-08-14 (×3): 15 mg via ORAL
  Filled 2018-08-11 (×3): qty 1

## 2018-08-11 MED ORDER — DOXAZOSIN MESYLATE 8 MG PO TABS
4.0000 mg | ORAL_TABLET | Freq: Every day | ORAL | Status: DC
  Administered 2018-08-11 – 2018-08-15 (×5): 4 mg via ORAL
  Filled 2018-08-11 (×6): qty 1

## 2018-08-11 MED ORDER — FLUTICASONE PROPIONATE 50 MCG/ACT NA SUSP: 1.0000 | Freq: Every day | NASAL | Status: DC | PRN

## 2018-08-11 MED ORDER — IRBESARTAN 300 MG PO TABS
300.0000 mg | ORAL_TABLET | Freq: Every day | ORAL | Status: DC
  Administered 2018-08-11: 300 mg via ORAL
  Filled 2018-08-11 (×2): qty 1

## 2018-08-11 MED ORDER — LORAZEPAM 1 MG PO TABS: 1.0000 mg | ORAL_TABLET | Freq: Two times a day (BID) | ORAL | Status: DC | PRN

## 2018-08-11 MED ORDER — ATORVASTATIN CALCIUM 40 MG PO TABS
40.0000 mg | ORAL_TABLET | Freq: Every day | ORAL | Status: DC
  Administered 2018-08-11 – 2018-08-14 (×4): 40 mg via ORAL
  Filled 2018-08-11 (×4): qty 1

## 2018-08-11 MED ORDER — MONTELUKAST SODIUM 10 MG PO TABS
10.0000 mg | ORAL_TABLET | Freq: Every day | ORAL | Status: DC
  Administered 2018-08-11 – 2018-08-14 (×4): 10 mg via ORAL
  Filled 2018-08-11 (×5): qty 1

## 2018-08-11 MED ORDER — ASPIRIN EC 81 MG PO TBEC
81.0000 mg | DELAYED_RELEASE_TABLET | Freq: Every day | ORAL | Status: DC
  Administered 2018-08-11 – 2018-08-15 (×5): 81 mg via ORAL
  Filled 2018-08-11 (×5): qty 1

## 2018-08-11 MED ORDER — ALBUTEROL SULFATE (2.5 MG/3ML) 0.083% IN NEBU
2.5000 mg | INHALATION_SOLUTION | RESPIRATORY_TRACT | Status: DC | PRN
Start: 2018-08-11 — End: 2018-08-15

## 2018-08-11 MED ORDER — INSULIN ASPART 100 UNIT/ML ~~LOC~~ SOLN
0.0000 [IU] | Freq: Three times a day (TID) | SUBCUTANEOUS | Status: DC
  Administered 2018-08-12 – 2018-08-13 (×2): 1 [IU] via SUBCUTANEOUS

## 2018-08-11 MED ORDER — DICYCLOMINE HCL 10 MG PO CAPS
10.0000 mg | ORAL_CAPSULE | Freq: Three times a day (TID) | ORAL | Status: DC
  Administered 2018-08-12 – 2018-08-15 (×10): 10 mg via ORAL
  Filled 2018-08-11 (×11): qty 1

## 2018-08-11 MED ORDER — ENOXAPARIN SODIUM 40 MG/0.4ML ~~LOC~~ SOLN
40.0000 mg | Freq: Every day | SUBCUTANEOUS | Status: DC
  Administered 2018-08-11 – 2018-08-12 (×2): 40 mg via SUBCUTANEOUS
  Filled 2018-08-11 (×4): qty 0.4

## 2018-08-11 MED ORDER — VITAMIN C 500 MG PO TABS
500.0000 mg | ORAL_TABLET | Freq: Every day | ORAL | Status: DC
  Administered 2018-08-11 – 2018-08-15 (×5): 500 mg via ORAL
  Filled 2018-08-11 (×5): qty 1

## 2018-08-11 NOTE — ED Notes (Signed)
Attempted report 

## 2018-08-11 NOTE — Progress Notes (Signed)
RT took patient off of BIPAP per patient request. Patient shows no signs of respiratory distress at this time. RT placed patient on 3L [place]. RN is aware. RT will monitor as needed.

## 2018-08-11 NOTE — ED Provider Notes (Signed)
[place] EMERGENCY DEPARTMENT Provider Note   CSN: [id number] Arrival date & time: 08/11/18  0022     History   Chief Complaint Chief Complaint  Patient presents with  . Chest Pain    HPI Ann Fuller is a 72 y.o. female.  Patient with past medical history of CHF, hyper tension, hyperlipidemia, diabetes, presents to the emergency department with a chief complaint of shortness of breath.  She reports associated chest pain.  She reports being seen 3 days ago at [facility].  She was encouraged to stay and have echocardiogram, but ultimately elected to be discharged due to having an outpatient echo scheduled.  She reports gradually worsening symptoms.  States that today her symptoms became severe.  She reports new lower extremity swelling around her ankles.  She endorses a productive cough.  She states that she has been using breathing treatments all day with no relief of her symptoms.    The history is provided by the patient. No language interpreter was used.    Past Medical History:  Diagnosis Date  . Adenomatous colon polyp   . Arthritis   . Asthma   . Breast cancer ([place]) [id number]  . Bulging lumbar disc   . CHF (congestive heart failure) ([place])   . Depression   . Diverticulitis   . Dyspepsia   . Essential hypertension, benign   . GERD (gastroesophageal reflux disease)   . Heart murmur   . Hyperlipidemia   . IBS (irritable bowel syndrome)   . Internal hemorrhoid   . Right shoulder pain   . Tubular adenoma 11/2010  . Type 2 diabetes mellitus [facility])     Patient Active Problem List   Diagnosis Date Noted  . Chest pain 08/08/2018  . Bulging lumbar disc 11/18/2017  . Lactic acidosis 11/17/2017  . Aortic valve stenosis 10/23/2017  . Severe persistent asthma without complication 09/73/5329  . Abdominal pain, LLQ 01/22/2017  . LUQ abdominal pain 01/22/2017  . Normocytic anemia 01/22/2017  . Cough 09/10/2016  . Dilation of biliary tract  09/10/2016  . Breast pain, right 08/06/2016  . History of adenomatous polyp of colon   . Diverticulosis of colon without hemorrhage   . Adenomatous polyp of colon 10/17/2015  . Upper airway cough syndrome 10/10/2015  . Class 3 severe obesity with body mass index (BMI) greater than or equal to 70 in adult ([place]) 10/10/2015  . Diastolic dysfunction 92/42/6834  . Insomnia 04/06/2015  . Cough variant asthma 01/22/2015  . IBS (irritable bowel syndrome) 08/25/2014  . Syncope 03/08/2014  . Murmur, cardiac 03/08/2014  . Type 2 diabetes mellitus ([place]) 03/05/2014  . Hyperlipidemia 03/05/2014  . Essential hypertension, benign 03/05/2014  . Gastroesophageal reflux disease without esophagitis 11/23/2011  . Diarrhea 01/25/2011  . CARCINOMA, BREAST, HX OF 11/21/2010    Past Surgical History:  Procedure Laterality Date  . ABDOMINAL HYSTERECTOMY    . APPENDECTOMY    . BACK SURGERY  2007   Lumbar fusion  . CATARACT EXTRACTION W/PHACO Left 08/27/2013   Procedure: CATARACT EXTRACTION PHACO AND INTRAOCULAR LENS PLACEMENT (IOC);  Surgeon: Tonny Branch, MD;  Location: [facility];  Service: Ophthalmology;  Laterality: Left;  CDE 4.25  . CATARACT EXTRACTION W/PHACO Right 09/24/2013   Procedure: CATARACT EXTRACTION PHACO AND INTRAOCULAR LENS PLACEMENT (IOC);  Surgeon: Tonny Branch, MD;  Location: [facility];  Service: Ophthalmology;  Laterality: Right;  CDE:  8.16  . CHOLECYSTECTOMY    . COLONOSCOPY  12/13/2010   Dr. Margarito Courser  pancolonic diverticular.tubular adenoma  . COLONOSCOPY  07/13/2003  . COLONOSCOPY N/A 10/28/2015   MEQ:ASTMHDQ diverticulosis, multiple tubular adenomas removed. next tcs 10/2020.   . ESOPHAGOGASTRODUODENOSCOPY  12/13/2010   Dr. Jennet Maduro hernia, fundal gland type polyps  . EYE SURGERY Left    Left KPE 08/27/13  . MASTECTOMY Left 1995  . NASAL ENDOSCOPY WITH EPISTAXIS CONTROL Left 07/16/2016   Procedure: ENDOSCOPIC LEFT NASAL CAUTERY;  Surgeon: Leta Baptist, MD;  Location: [facility];  Service: ENT;  Laterality: Left;  . TONSILLECTOMY    . TUBAL LIGATION       OB History   None      Home Medications    Prior to Admission medications   Medication Sig Start Date End Date Taking? Authorizing Provider  acetaminophen (TYLENOL) 650 MG CR tablet Take 650 mg by mouth every 8 (eight) hours as needed for pain.    [provider]  albuterol (PROVENTIL HFA;VENTOLIN HFA) 108 (90 Base) MCG/ACT inhaler Inhale 2 puffs into the lungs every 4 (four) hours as needed for wheezing or shortness of breath.    [provider]  amLODipine (NORVASC) 5 MG tablet Take 5 mg by mouth daily.    [provider]  aspirin EC 81 MG tablet Take 81 mg by mouth daily.    [provider]  atorvastatin (LIPITOR) 40 MG tablet Take 40 mg by mouth at bedtime.     [provider]  baclofen (LIORESAL) 10 MG tablet Take 5 mg by mouth 3 (three) times daily as needed for muscle spasms.     [provider]  Benralizumab (FASENRA) 30 MG/ML SOSY Inject 30 mg into the skin every 8 (eight) weeks.     [provider]  budesonide-formoterol (SYMBICORT) 80-4.5 MCG/ACT inhaler Inhale 2 puffs into the lungs 2 (two) times daily. 08/31/16   Tanda Rockers, MD  carvedilol (COREG) 3.125 MG tablet Take 1 tablet (3.125 mg total) by mouth 2 (two) times daily with a meal. 11/22/17   Kathie Dike, MD  dicyclomine (BENTYL) 10 MG capsule TAKE 1 CAPSULE BY MOUTH PRIOR TO MEALS AS NEEDED .Marland KitchenMarland KitchenNO MORE THAN 4 TIMES DAILY MAY CAUSE DROWSINESS Patient taking differently: Take 10 mg by mouth 3 (three) times daily before meals.  07/22/18   Annitta Needs, NP  doxazosin (CARDURA) 4 MG tablet Take 4 mg by mouth daily.    [provider]  EPINEPHrine (EPIPEN 2-PAK) 0.3 mg/0.3 mL IJ SOAJ injection Inject 0.3 mg into the muscle daily as needed (allergic reaction).    [provider]  exenatide (BYETTA 10 MCG PEN) 10 MCG/0.04ML SOLN Inject 10 mcg into the skin 2  (two) times daily with a meal.     [provider]  fluticasone (FLONASE) 50 MCG/ACT nasal spray Place 1 spray into both nostrils daily as needed for allergies or rhinitis.    [provider]  furosemide (LASIX) 20 MG tablet Take 1 tablet (20 mg total) by mouth daily. 11/22/17   Kathie Dike, MD  Insulin Glargine (LANTUS) 100 UNIT/ML Solostar Pen Inject 26 Units into the skin every morning.     [provider]  Insulin Lispro (HUMALOG KWIKPEN [place]) Inject 6 Units into the skin 2 (two) times daily before a meal.     [provider]  ipratropium-albuterol (DUONEB) 0.5-2.5 (3) MG/3ML SOLN Take 3 mLs by nebulization every 4 (four) hours. Patient taking differently: Take 3 mLs by nebulization every 6 (six) hours as needed (wheezing and  shortness of breath).  04/10/15   Asencion Noble, MD  LORazepam (ATIVAN) 1 MG tablet Take 1 mg by mouth 2 (two) times daily as needed for anxiety or sleep.     [provider]  magnesium oxide (MAG-OX) 400 MG tablet Take 400 mg by mouth daily.    [provider]  mometasone-formoterol (DULERA) 100-5 MCG/ACT AERO Inhale 2 puffs into the lungs 2 (two) times daily.    [provider]  montelukast (SINGULAIR) 10 MG tablet TAKE ONE TABLET BY MOUTH EVERY NIGHT AT BEDTIME Patient taking differently: Take 10 mg by mouth at bedtime.  11/20/16   Tanda Rockers, MD  olmesartan (BENICAR) 40 MG tablet Take 40 mg by mouth daily.     [provider]  pantoprazole (PROTONIX) 40 MG tablet TAKE ONE TABLET TWICE DAILY BEFORE A MEAL Patient taking differently: Take 40 mg by mouth 2 (two) times daily before a meal.  02/05/18   Annitta Needs, NP  sertraline (ZOLOFT) 50 MG tablet Take 50 mg by mouth daily.     [provider]  Spacer/Aero-Holding Chambers (AEROCHAMBER MV) inhaler Use as instructed 08/31/16   Tanda Rockers, MD  temazepam (RESTORIL) 15 MG capsule Take 15 mg by mouth at bedtime as needed for sleep.     [provider]  vitamin C (ASCORBIC ACID) 500 MG tablet Take 500 mg by mouth daily.     [provider]    Family History Family History  Problem Relation Age of Onset  . Colon cancer Mother   . Heart attack Father   . Heart attack Sister   . Stroke Sister   . Cancer - Lung Sister     Social History Social History   Tobacco Use  . Smoking status: Never Smoker  . Smokeless tobacco: Never Used  Substance Use Topics  . Alcohol use: No  . Drug use: No     Allergies   Codeine; Ceftin [cefuroxime axetil]; Metformin and related; Nexium [esomeprazole magnesium]; Omnicef [cefdinir]; and Sulfa antibiotics   Review of Systems Review of Systems  All other systems reviewed and are negative.    Physical Exam Updated Vital Signs BP (!) 158/80 (BP Location: Right Arm)   Pulse (!) 108   Temp 99.4 F (37.4 C) (Oral)   Resp (!) 22   Ht 4\' 11"  (1.499 m)   Wt 110.9 kg   SpO2 (!) 79% Comment: up to 96% on 4L 02  BMI 49.38 kg/m   Physical Exam  Constitutional: She is oriented to person, place, and time. She appears well-developed and well-nourished.  HENT:  Head: Normocephalic and atraumatic.  Eyes: Pupils are equal, round, and reactive to light. Conjunctivae and EOM are normal.  Neck: Normal range of motion. Neck supple.  Cardiovascular: Regular rhythm. Tachycardia present.  Pulmonary/Chest: She is in respiratory distress. She has decreased breath sounds. She exhibits no tenderness.  Moderate respiratory distress, accessory muscle use, tachypnea, speaks in 3-4 word phrases  Abdominal: Soft. Bowel sounds are normal. She exhibits no distension and no mass. There is no tenderness. There is no rebound and no guarding.  Musculoskeletal: Normal range of motion. She exhibits no tenderness.       Right lower leg: She exhibits edema.       Left lower leg: She exhibits edema.  1+ lower extremity pitting edema bilaterally  Neurological: She is alert and oriented to  person, place, and time.  Skin: Skin is warm and dry.  Psychiatric: She  has a normal mood and affect. Her behavior is normal. Judgment and thought content normal.  Nursing note and vitals reviewed.    ED Treatments / Results  Labs (all labs ordered are listed, but only abnormal results are displayed) Labs Reviewed  BASIC METABOLIC PANEL - Abnormal; Notable for the following components:      Result Value   Sodium 134 (*)    Chloride 96 (*)    GFR calc non Af Amer 58 (*)    All other components within normal limits  CBC - Abnormal; Notable for the following components:   WBC 11.5 (*)    RBC 3.20 (*)    Hemoglobin 9.0 (*)    HCT 29.3 (*)    All other components within normal limits  BRAIN NATRIURETIC PEPTIDE - Abnormal; Notable for the following components:   B Natriuretic Peptide 110.3 (*)    All other components within normal limits  I-STAT TROPONIN, ED    EKG EKG Interpretation  Date/Time:  Monday August 11 2018 00:30:09 EDT Ventricular Rate:  105 PR Interval:  148 QRS Duration: 68 QT Interval:  340 QTC Calculation: 449 R Axis:   27 Text Interpretation:  Sinus tachycardia ST & T wave abnormality, consider inferior ischemia Abnormal ECG Since last EKG, there are new ST depressions in lateral leads Confirmed by Duffy Bruce [phone]) on 08/11/2018 1:32:18 AM   Radiology Dg Chest Portable 1 View  Result Date: 08/11/2018 CLINICAL DATA:  Shortness of breath and chest pain tonight. EXAM: PORTABLE CHEST 1 VIEW COMPARISON:  08/08/2018 FINDINGS: Shallow inspiration. Mild cardiac enlargement. Pulmonary vascular congestion. Bilateral perihilar infiltration likely representing edema although multifocal pneumonia could also have this appearance. No blunting of costophrenic angles. No pneumothorax. Degenerative changes in the spine and shoulders. IMPRESSION: Cardiac enlargement with pulmonary vascular congestion and bilateral perihilar edema. Developing since previous study.  Electronically Signed   By: Lucienne Capers M.D.   On: 08/11/2018 00:48    Procedures Procedures (including critical care time) CRITICAL CARE Performed by: Montine Circle   Total critical care time: 37 minutes  Critical care time was exclusive of separately billable procedures and treating other patients.  Critical care was necessary to treat or prevent imminent or life-threatening deterioration.  Critical care was time spent personally by me on the following activities: development of treatment plan with patient and/or surrogate as well as nursing, discussions with consultants, evaluation of patient's response to treatment, examination of patient, obtaining history from patient or surrogate, ordering and performing treatments and interventions, ordering and review of laboratory studies, ordering and review of radiographic studies, pulse oximetry and re-evaluation of patient's condition.  Medications Ordered in ED Medications - No data to display   Initial Impression / Assessment and Plan / ED Course  I have reviewed the triage vital signs and the nursing notes.  Pertinent labs & imaging results that were available during my care of the patient were reviewed by me and considered in my medical decision making (see chart for details).     Patient with shortness of breath.  She has a history of CHF.  She has new leg swelling.  Has worsening shortness of breath.  She is in moderate respiratory distress.  Her pulse oxygenation was 79% in triage.  She was placed on 4 L nasal cannula, and her oxygen has come up to 99%.  She is still speaking in 3-4 word sentences, and is tachypneic to the mid 30s.  She has very diminished lung sounds, and little  air movement.  Feel that at this time she would benefit from BiPAP.  Patient feeling somewhat improved on BiPAP.  Her respiration rate has decreased into the mid to upper 20s.  She is maintaining normal pulse oxygenation.  We will give 40mg  IV Lasix.   Will admit to medicine.  Patient seen by discussed with Dr. Ellender Hose, who agrees with plan.   MDM Reviewed: previous chart, nursing note and vitals Reviewed previous: labs Interpretation: labs, ECG and x-ray (Vascular congestion on chest x-ray, lateral depressions on EKG (new), mild leukocytosis, minimally elevated BNP ) Total time providing critical care: 30-74 minutes (37). This excludes time spent performing separately reportable procedures and services. Consults: admitting MD     Final Clinical Impressions(s) / ED Diagnoses   Final diagnoses:  Acute on chronic congestive heart failure, unspecified heart failure type [facility])    ED Discharge Orders    None       Montine Circle, PA-C 08/11/18 0201    Duffy Bruce, MD 08/11/18 [phone]

## 2018-08-11 NOTE — Progress Notes (Signed)
[place] TEAM 1 - Stepdown/ICU TEAM  Ann Fuller  JSH:[id number] DOB: 12-12-1946 DOA: 08/11/2018 PCP: Asencion Noble, MD    Brief Narrative:  72 y.o. female with history of CHF, AoS, asthma, DM2, morbid obesity, anemia, and moderate aortic stenosis who was discharged home 8/17 after an inpatient evaluation for chest pain at [facility]. She was planning to follow-up with her cardiologist at [facility] but returned to the ED w/progressive shortness of breath since discharge.  She denied further chest pain.   In the ER the patient was hypoxic and required BiPAP support.  Her CXR suggested pulmonary edema.  Exam revealed bilateral lower extremity edema and JVD.  Significant Events: 8/17 d/c from [facility]  8/19 admit   Subjective: Pt is seen for a f/u visit.    Assessment & Plan:  Acute exacerbation of chronic diastolic CHF 07/28/276 echo EF 60-65%, moderate left ear, trace TR, mild MR - repeat TTE - diurese   Moderate Aortic Stenosis Followed by Cardiology at [facility]  Eosinophilic Asthma continue bronchodilators, singulair, benralizumab  DM2 02/19/18 A1c 6.2  Essential hypertension  Anxiety/depression Continue Zoloft  Hyperlipidemia Continue statin  Morbid Obesity - Body mass index is 49.38 kg/m.   Normocytic anemia  Check anemia panel   DVT prophylaxis: lovenox  Code Status: FULL CODE Family Communication:  Disposition Plan: stable for tele bed   Consultants:  none  Antimicrobials:  none   Objective: Blood pressure (!) 126/56, pulse 87, temperature 99.4 F (37.4 C), temperature source Oral, resp. rate (!) 26, height 4\' 11"  (1.499 m), weight 110.9 kg, SpO2 96 %.  Intake/Output Summary (Last 24 hours) at 08/11/2018 1506 Last data filed at 08/11/2018 0727 Gross per 24 hour  Intake -  Output 1850 ml  Net -1850 ml   Filed Weights   08/11/18 0032  Weight: 110.9 kg    Examination: Pt was seen for a f/u visit.    CBC: Recent Labs  Lab 08/08/18 1729  08/11/18 0039 08/11/18 0607  WBC 7.4 11.5* 11.8*  NEUTROABS  --   --  9.4*  HGB 8.8* 9.0* 8.8*  HCT 27.7* 29.3* 27.9*  MCV 89.4 91.6 90.6  PLT 249 245 412   Basic Metabolic Panel: Recent Labs  Lab 08/08/18 1729 08/11/18 0039 08/11/18 0607  NA 131* 134* 136  K 3.8 4.3 4.1  CL 95* 96* 98  CO2 28 27 28   GLUCOSE 126* 82 102*  BUN 21 16 15   CREATININE 1.07* 0.97 0.97  CALCIUM 8.7* 9.3 9.2  MG  --   --  1.8   GFR: Estimated Creatinine Clearance: 59.9 mL/min (by C-G formula based on SCr of 0.97 mg/dL).  Liver Function Tests: Recent Labs  Lab 08/11/18 0607  AST 14*  ALT 13  ALKPHOS 54  BILITOT 0.9  PROT 7.3  ALBUMIN 3.4*    Cardiac Enzymes: Recent Labs  Lab 08/08/18 1729 08/08/18 2052 08/09/18 0237 08/11/18 0607 08/11/18 1152  TROPONINI <0.03 <0.03 <0.03 <0.03 <0.03    HbA1C: Hgb A1c MFr Bld  Date/Time Value Ref Range Status  02/19/2018 05:44 AM 6.2 (H) 4.8 - 5.6 % Final    Comment:    (NOTE) Pre diabetes:          5.7%-6.4% Diabetes:              >6.4% Glycemic control for   <7.0% adults with diabetes   04/06/2015 06:39 PM 6.5 (H) 4.8 - 5.6 % Final    Comment:    (NOTE)  Pre-diabetes: 5.7 - 6.4         Diabetes: >6.4         Glycemic control for adults with diabetes: <7.0     CBG: Recent Labs  Lab 08/08/18 2333 08/09/18 0733 08/11/18 0754 08/11/18 1154  GLUCAP 166* 109* 108* 112*    Scheduled Meds: . amLODipine  5 mg Oral Daily  . aspirin EC  81 mg Oral Daily  . atorvastatin  40 mg Oral QHS  . carvedilol  3.125 mg Oral BID WC  . doxazosin  4 mg Oral Daily  . enoxaparin (LOVENOX) injection  40 mg Subcutaneous Daily  . exenatide  10 mcg Subcutaneous BID WC  . furosemide  40 mg Intravenous BID  . insulin aspart  0-9 Units Subcutaneous TID WC  . insulin glargine  26 Units Subcutaneous q morning - 10a  . irbesartan  300 mg Oral Daily  . magnesium oxide  400 mg Oral Daily  . mometasone-formoterol  2 puff Inhalation BID  .  montelukast  10 mg Oral QHS  . pantoprazole  40 mg Oral BID AC  . sertraline  50 mg Oral Daily  . vitamin C  500 mg Oral Daily     LOS: 0 days   Time spent: No Charge  Cherene Altes, MD [facility] Office  [phone] Pager - Text Page per Amion as per below:  On-Call/Text Page:      [email or web address]  If 7PM-7AM, please contact night-coverage [email or web address] 08/11/2018, 3:06 PM

## 2018-08-11 NOTE — ED Notes (Signed)
Pt reporting HA, admitting MD messaged.

## 2018-08-11 NOTE — H&P (Signed)
History and Physical    Ann Fuller:[id number] DOB: 06-21-1946 DOA: 08/11/2018  PCP: Asencion Noble, MD  Patient coming from: Home.  Chief Complaint: Shortness of breath.  HPI: Ann Fuller is a 72 y.o. female with history of CHF, asthma, diabetes mellitus type 2, morbid obesity, anemia, moderate aortic stenosis per the chart who was recently admitted for chest pain and was discharged home with patient planning to follow-up with her cardiologist at [facility] presents with worsening shortness of breath since discharge.  Patient states he has been having shortness of breath which has been progressively worsening last few days.  Her chest pain is resolved.  Chest pain happened during last admission to 3 days ago which was radiating to her jaw.  Denies any productive cough fever or chills.  ED Course: In the ER patient was short of breath and hypoxic and had to be placed on BiPAP chest x-ray shows congestion.  BNP was around 110.  EKG shows sinus tachycardia patient was given Lasix and admitted for acute CHF.  Patient has bilateral lower extremity edema.  JVD elevated.  Review of Systems: As per HPI, rest all negative.   Past Medical History:  Diagnosis Date  . Adenomatous colon polyp   . Arthritis   . Asthma   . Breast cancer ([place]) [id number]  . Bulging lumbar disc   . CHF (congestive heart failure) ([place])   . Depression   . Diverticulitis   . Dyspepsia   . Essential hypertension, benign   . GERD (gastroesophageal reflux disease)   . Heart murmur   . Hyperlipidemia   . IBS (irritable bowel syndrome)   . Internal hemorrhoid   . Right shoulder pain   . Tubular adenoma 11/2010  . Type 2 diabetes mellitus ([place])     Past Surgical History:  Procedure Laterality Date  . ABDOMINAL HYSTERECTOMY    . APPENDECTOMY    . BACK SURGERY  2007   Lumbar fusion  . CATARACT EXTRACTION W/PHACO Left 08/27/2013   Procedure: CATARACT EXTRACTION PHACO AND INTRAOCULAR LENS PLACEMENT  (IOC);  Surgeon: Tonny Branch, MD;  Location: [facility];  Service: Ophthalmology;  Laterality: Left;  CDE 4.25  . CATARACT EXTRACTION W/PHACO Right 09/24/2013   Procedure: CATARACT EXTRACTION PHACO AND INTRAOCULAR LENS PLACEMENT (IOC);  Surgeon: Tonny Branch, MD;  Location: [facility];  Service: Ophthalmology;  Laterality: Right;  CDE:  8.16  . CHOLECYSTECTOMY    . COLONOSCOPY  12/13/2010   Dr. Margarito Courser pancolonic diverticular.tubular adenoma  . COLONOSCOPY  07/13/2003  . COLONOSCOPY N/A 10/28/2015   IPJ:ASNKNLZ diverticulosis, multiple tubular adenomas removed. next tcs 10/2020.   . ESOPHAGOGASTRODUODENOSCOPY  12/13/2010   Dr. Jennet Maduro hernia, fundal gland type polyps  . EYE SURGERY Left    Left KPE 08/27/13  . MASTECTOMY Left 1995  . NASAL ENDOSCOPY WITH EPISTAXIS CONTROL Left 07/16/2016   Procedure: ENDOSCOPIC LEFT NASAL CAUTERY;  Surgeon: Leta Baptist, MD;  Location: [place];  Service: ENT;  Laterality: Left;  . TONSILLECTOMY    . TUBAL LIGATION       reports that she has never smoked. She has never used smokeless tobacco. She reports that she does not drink alcohol or use drugs.  Allergies  Allergen Reactions  . Codeine Swelling    Tongue swells  . Ceftin [Cefuroxime Axetil] Swelling  . Metformin And Related Diarrhea  . Nexium [Esomeprazole Magnesium] Diarrhea  . Omnicef [Cefdinir] Diarrhea  . Sulfa Antibiotics Rash    Family History  Problem Relation Age of Onset  . Colon cancer Mother   . Heart attack Father   . Heart attack Sister   . Stroke Sister   . Cancer - Lung Sister     Prior to Admission medications   Medication Sig Start Date End Date Taking? Authorizing Provider  acetaminophen (TYLENOL) 650 MG CR tablet Take 650 mg by mouth every 8 (eight) hours as needed for pain.   Yes [provider]  albuterol (PROVENTIL HFA;VENTOLIN HFA) 108 (90 Base) MCG/ACT inhaler Inhale 2 puffs into the lungs every 4 (four) hours as needed for wheezing or  shortness of breath.   Yes [provider]  amLODipine (NORVASC) 5 MG tablet Take 5 mg by mouth daily.   Yes [provider]  aspirin EC 81 MG tablet Take 81 mg by mouth daily.   Yes [provider]  atorvastatin (LIPITOR) 40 MG tablet Take 40 mg by mouth at bedtime.    Yes [provider]  baclofen (LIORESAL) 10 MG tablet Take 5 mg by mouth 3 (three) times daily as needed for muscle spasms.    Yes [provider]  Benralizumab (FASENRA) 30 MG/ML SOSY Inject 30 mg into the skin every 8 (eight) weeks.    Yes [provider]  budesonide-formoterol (SYMBICORT) 80-4.5 MCG/ACT inhaler Inhale 2 puffs into the lungs 2 (two) times daily. 08/31/16  Yes Tanda Rockers, MD  carvedilol (COREG) 3.125 MG tablet Take 1 tablet (3.125 mg total) by mouth 2 (two) times daily with a meal. 11/22/17  Yes Memon, Jolaine Artist, MD  dicyclomine (BENTYL) 10 MG capsule TAKE 1 CAPSULE BY MOUTH PRIOR TO MEALS AS NEEDED .Marland KitchenMarland KitchenNO MORE THAN 4 TIMES DAILY MAY CAUSE DROWSINESS Patient taking differently: Take 10 mg by mouth 3 (three) times daily before meals.  07/22/18  Yes Annitta Needs, NP  doxazosin (CARDURA) 4 MG tablet Take 4 mg by mouth daily.   Yes [provider]  EPINEPHrine (EPIPEN 2-PAK) 0.3 mg/0.3 mL IJ SOAJ injection Inject 0.3 mg into the muscle daily as needed (allergic reaction).   Yes [provider]  exenatide (BYETTA 10 MCG PEN) 10 MCG/0.04ML SOLN Inject 10 mcg into the skin 2 (two) times daily with a meal.    Yes [provider]  fluticasone (FLONASE) 50 MCG/ACT nasal spray Place 1 spray into both nostrils daily as needed for allergies or rhinitis.   Yes [provider]  furosemide (LASIX) 20 MG tablet Take 1 tablet (20 mg total) by mouth daily. 11/22/17  Yes Kathie Dike, MD  Insulin Glargine (LANTUS) 100 UNIT/ML Solostar Pen Inject 26 Units into the skin every morning.    Yes [provider]  Insulin Lispro (HUMALOG  KWIKPEN Man) Inject 6 Units into the skin 2 (two) times daily before a meal.    Yes [provider]  ipratropium-albuterol (DUONEB) 0.5-2.5 (3) MG/3ML SOLN Take 3 mLs by nebulization every 4 (four) hours. Patient taking differently: Take 3 mLs by nebulization every 6 (six) hours as needed (wheezing and shortness of breath).  04/10/15  Yes Asencion Noble, MD  LORazepam (ATIVAN) 1 MG tablet Take 1 mg by mouth 2 (two) times daily as needed for anxiety or sleep.    Yes [provider]  magnesium oxide (MAG-OX) 400 MG tablet Take 400 mg by mouth daily.   Yes [provider]  mometasone-formoterol (DULERA) 100-5 MCG/ACT AERO Inhale 2 puffs into the lungs 2 (two) times daily.   Yes [provider]  montelukast (SINGULAIR) 10 MG tablet TAKE ONE TABLET BY MOUTH EVERY NIGHT AT BEDTIME Patient taking differently: Take 10 mg by mouth at bedtime.  11/20/16  Yes Tanda Rockers, MD  olmesartan (BENICAR) 40 MG tablet Take 40 mg by mouth daily.    Yes [provider]  pantoprazole (PROTONIX) 40 MG tablet TAKE ONE TABLET TWICE DAILY BEFORE A MEAL Patient taking differently: Take 40 mg by mouth 2 (two) times daily before a meal.  02/05/18  Yes Annitta Needs, NP  sertraline (ZOLOFT) 50 MG tablet Take 50 mg by mouth daily.    Yes [provider]  Spacer/Aero-Holding Chambers (AEROCHAMBER MV) inhaler Use as instructed 08/31/16  Yes Tanda Rockers, MD  temazepam (RESTORIL) 15 MG capsule Take 15 mg by mouth at bedtime as needed for sleep.   Yes [provider]  vitamin C (ASCORBIC ACID) 500 MG tablet Take 500 mg by mouth daily.    Yes [provider]    Physical Exam: Vitals:   08/11/18 0032 08/11/18 0112 08/11/18 0145 08/11/18 0320  BP:   132/75 (!) 153/61  Pulse:  95 91 94  Resp:  (!) 25 (!) 27 (!) 31  Temp:      TempSrc:      SpO2:  99% 96% 95%  Weight: 110.9 kg     Height: 4\' 11"  (1.499 m)         Constitutional: Moderately built and  nourished. Vitals:   08/11/18 0032 08/11/18 0112 08/11/18 0145 08/11/18 0320  BP:   132/75 (!) 153/61  Pulse:  95 91 94  Resp:  (!) 25 (!) 27 (!) 31  Temp:      TempSrc:      SpO2:  99% 96% 95%  Weight: 110.9 kg     Height: 4\' 11"  (1.499 m)      Eyes: Anicteric no pallor. ENMT: No discharge from the ears eyes nose or mouth. Neck: JVD elevated no mass felt. Respiratory: No rhonchi or crepitations. Cardiovascular: S1-S2 heard no murmurs appreciated. Abdomen: Soft nontender bowel sounds present. Musculoskeletal: Bilateral lower extremity edema present.  No joint effusion. Skin: No rash. Neurologic: Alert awake oriented to time place and person.  Moves all extremities. Psychiatric: Appears normal.   Labs on Admission: I have personally reviewed following labs and imaging studies  CBC: Recent Labs  Lab 08/08/18 1729 08/11/18 0039  WBC 7.4 11.5*  HGB 8.8* 9.0*  HCT 27.7* 29.3*  MCV 89.4 91.6  PLT 249 409   Basic Metabolic Panel: Recent Labs  Lab 08/08/18 1729 08/11/18 0039  NA 131* 134*  K 3.8 4.3  CL 95* 96*  CO2 28 27  GLUCOSE 126* 82  BUN 21 16  CREATININE 1.07* 0.97  CALCIUM 8.7* 9.3   GFR: Estimated Creatinine Clearance: 59.9 mL/min (by C-G formula based on SCr of 0.97 mg/dL). Liver Function Tests: No results for input(s): AST, ALT, ALKPHOS, BILITOT, PROT, ALBUMIN in the last 168 hours. No results for input(s): LIPASE, AMYLASE in the last 168 hours. No results for input(s): AMMONIA in the last 168 hours. Coagulation Profile: No results for input(s): INR, PROTIME in the last 168 hours. Cardiac Enzymes: Recent Labs  Lab 08/08/18 1729 08/08/18 2052 08/09/18 0237  TROPONINI <0.03 <0.03 <0.03   BNP (last 3 results) No results for input(s): PROBNP in the last 8760 hours. HbA1C: No results for input(s): HGBA1C in the last 72 hours. CBG: Recent Labs  Lab 08/08/18 2333 08/09/18 0733  GLUCAP 166* 109*  Lipid Profile: No results for input(s): CHOL,  HDL, LDLCALC, TRIG, CHOLHDL, LDLDIRECT in the last 72 hours. Thyroid Function Tests: No results for input(s): TSH, T4TOTAL, FREET4, T3FREE, THYROIDAB in the last 72 hours. Anemia Panel: No results for input(s): VITAMINB12, FOLATE, FERRITIN, TIBC, IRON, RETICCTPCT in the last 72 hours. Urine analysis:    Component Value Date/Time   COLORURINE YELLOW 04/06/2015 1935   APPEARANCEUR CLEAR 04/06/2015 1935   LABSPEC 1.015 04/06/2015 1935   PHURINE 5.5 04/06/2015 1935   GLUCOSEU NEGATIVE 04/06/2015 1935   GLUCOSEU NEG mg/dL 11/21/2010 1308   HGBUR TRACE (A) 04/06/2015 1935   BILIRUBINUR NEGATIVE 04/06/2015 1935   KETONESUR NEGATIVE 04/06/2015 1935   PROTEINUR NEGATIVE 04/06/2015 1935   UROBILINOGEN 0.2 04/06/2015 1935   NITRITE NEGATIVE 04/06/2015 1935   LEUKOCYTESUR NEGATIVE 04/06/2015 1935   Sepsis Labs: @LABRCNTIP (procalcitonin:4,lacticidven:4) )No results found for this or any previous visit (from the past 240 hour(s)).   Radiological Exams on Admission: Dg Chest Portable 1 View  Result Date: 08/11/2018 CLINICAL DATA:  Shortness of breath and chest pain tonight. EXAM: PORTABLE CHEST 1 VIEW COMPARISON:  08/08/2018 FINDINGS: Shallow inspiration. Mild cardiac enlargement. Pulmonary vascular congestion. Bilateral perihilar infiltration likely representing edema although multifocal pneumonia could also have this appearance. No blunting of costophrenic angles. No pneumothorax. Degenerative changes in the spine and shoulders. IMPRESSION: Cardiac enlargement with pulmonary vascular congestion and bilateral perihilar edema. Developing since previous study. Electronically Signed   By: Lucienne Capers M.D.   On: 08/11/2018 00:48    EKG: Independently reviewed.  Sinus tachycardia.  Assessment/Plan Principal Problem:   Acute respiratory failure with hypoxia (HCC) Active Problems:   Type 2 diabetes mellitus (HCC)   Essential hypertension, benign   Syncope   Normocytic anemia    1. Acute  respiratory failure with hypoxia likely from decompensated CHF last EF measured care everywhere was a stress echo done in April 2018 which showed moderate 70% EF and there is also mention of moderate aortic stenosis.  Patient kept on Lasix closely follow intake output metabolic panel and daily weights..  Patient does have severe asthma which could be contributing but presently not actively wheezing we will continue patient's inhalers.  The patient is on BiPAP which need to be slowly weaned off after Lasix. 2. History of severe asthma see #1. 3. Recent admission for chest pain denies any chest pain at this time.  We will cycle cardiac markers due to CHF.   4. Diabetes mellitus type 2 on Lantus insulin and Byetta. 5. Hypertension on amlodipine Coreg ARB and doxazosin. 6. Chronic anemia follow CBC.   DVT prophylaxis: Lovenox. Code Status: Full code. Family Communication: Family at the bedside. Disposition Plan: Home. Consults called: None. Admission status: Inpatient.   Rise Patience MD [facility] Pager [phone].  If 7PM-7AM, please contact night-coverage [email or web address] Password [facility]  08/11/2018, 4:38 AM

## 2018-08-11 NOTE — ED Triage Notes (Signed)
Pt c/o pain radiating up from abd to mid sternal area, pt c/o from [facility] Friday morning for same. Pt states pain never got better, pt states shob progressively got worse since last night, no relief from home nebs.

## 2018-08-11 NOTE — Progress Notes (Signed)
RT set up BIPAP and placed on patient. Pt tolerating well at this time. RT will monitor as needed.

## 2018-08-12 ENCOUNTER — Telehealth: Payer: Self-pay | Admitting: *Deleted

## 2018-08-12 ENCOUNTER — Inpatient Hospital Stay (HOSPITAL_COMMUNITY): Payer: Medicare Other

## 2018-08-12 DIAGNOSIS — I351 Nonrheumatic aortic (valve) insufficiency: Secondary | ICD-10-CM

## 2018-08-12 LAB — COMPREHENSIVE METABOLIC PANEL
ALK PHOS: 50 U/L (ref 38–126)
ALT: 11 U/L (ref 0–44)
ANION GAP: 6 (ref 5–15)
AST: 11 U/L — ABNORMAL LOW (ref 15–41)
Albumin: 3 g/dL — ABNORMAL LOW (ref 3.5–5.0)
BILIRUBIN TOTAL: 0.9 mg/dL (ref 0.3–1.2)
BUN: 20 mg/dL (ref 8–23)
CALCIUM: 8.9 mg/dL (ref 8.9–10.3)
CO2: 31 mmol/L (ref 22–32)
Chloride: 98 mmol/L (ref 98–111)
Creatinine, Ser: 1.32 mg/dL — ABNORMAL HIGH (ref 0.44–1.00)
GFR, EST AFRICAN AMERICAN: 46 mL/min — AB (ref >60)
GFR, EST NON AFRICAN AMERICAN: 40 mL/min — AB (ref >60)
GLUCOSE: 87 mg/dL (ref 70–99)
POTASSIUM: 4.1 mmol/L (ref 3.5–5.1)
Sodium: 135 mmol/L (ref 135–145)
TOTAL PROTEIN: 6.7 g/dL (ref 6.5–8.1)

## 2018-08-12 LAB — GLUCOSE, CAPILLARY
GLUCOSE-CAPILLARY: 149 mg/dL — AB (ref 70–99)
GLUCOSE-CAPILLARY: 132 mg/dL — AB (ref 70–99)
Glucose-Capillary: 84 mg/dL (ref 70–99)
Glucose-Capillary: 95 mg/dL (ref 70–99)

## 2018-08-12 LAB — ECHOCARDIOGRAM COMPLETE
HEIGHTINCHES: 59 in
WEIGHTICAEL: 3825.6 [oz_av]

## 2018-08-12 LAB — MAGNESIUM: MAGNESIUM: 2.1 mg/dL (ref 1.7–2.4)

## 2018-08-12 MED ORDER — IPRATROPIUM-ALBUTEROL 0.5-2.5 (3) MG/3ML IN SOLN
3.0000 mL | Freq: Four times a day (QID) | RESPIRATORY_TRACT | Status: DC
  Administered 2018-08-12 – 2018-08-13 (×5): 3 mL via RESPIRATORY_TRACT
  Filled 2018-08-12 (×5): qty 3

## 2018-08-12 MED ORDER — LOPERAMIDE HCL 2 MG PO CAPS
2.0000 mg | ORAL_CAPSULE | ORAL | Status: DC | PRN
  Administered 2018-08-12: 2 mg via ORAL
  Filled 2018-08-12: qty 1

## 2018-08-12 NOTE — Progress Notes (Signed)
Chaplain presented Advance Directive to patient, she was instructed to contact [place] if she needs any assistance once she reads over the document. She was appreciative of the visit, and she also has good family support system to assist her. Chaplain Yaakov Guthrie [phone]

## 2018-08-12 NOTE — Plan of Care (Signed)
  Problem: Health Behavior/Discharge Planning: Goal: Ability to manage health-related needs will improve Outcome: Progressing   Problem: Clinical Measurements: Goal: Ability to maintain clinical measurements within normal limits will improve Outcome: Progressing   Problem: Clinical Measurements: Goal: Will remain free from infection Outcome: Progressing   Problem: Clinical Measurements: Goal: Diagnostic test results will improve Outcome: Progressing   

## 2018-08-12 NOTE — Progress Notes (Signed)
Asking for  Imodium for loose stools, had 3 since this morning due to IBS per pt. Paged MD.  Venetia Night, RN

## 2018-08-12 NOTE — Plan of Care (Signed)
  Problem: Health Behavior/Discharge Planning: Goal: Ability to manage health-related needs will improve Outcome: Progressing   Problem: Clinical Measurements: Goal: Diagnostic test results will improve Outcome: Progressing   Problem: Clinical Measurements: Goal: Cardiovascular complication will be avoided Outcome: Progressing   

## 2018-08-12 NOTE — Telephone Encounter (Signed)
Communication noted.  

## 2018-08-12 NOTE — Evaluation (Signed)
Physical Therapy Evaluation Patient Details Name: Ann Fuller MRN: [id number] DOB: [birth year] Today's Date: 08/12/2018   History of Present Illness  72 y.o. female with history of CHF, asthma, diabetes mellitus type 2, morbid obesity, anemia, and moderate aortic stenosis. Pt  recently admitted for chest pain and was discharged home with patient planning to follow-up with her cardiologist at [facility] and now presents with worsening SOB since discharge.  Clinical Impression  Pt admitted secondary to problem above with deficits below. Pt requiring gross supervision for mobility without AD, however, pt very limited by fatigue and SOB. Oxygen sats decreasing to 85% on 3L following ambulation and required extended time and cues for pursed lip breathing to return to 91% on RA. Anticipate pt will progress well once breathing/oxygen sats improve. Will continue to follow acutely to maximize functional mobility independence and safety.     Follow Up Recommendations Home health PT;Supervision for mobility/OOB    Equipment Recommendations  Other (comment)(rollator )    Recommendations for Other Services       Precautions / Restrictions Precautions Precautions: Other (comment) Precaution Comments: watch O2 Restrictions Weight Bearing Restrictions: No      Mobility  Bed Mobility Overal bed mobility: Needs Assistance Bed Mobility: Supine to Sit;Sit to Supine     Supine to sit: Supervision Sit to supine: Supervision   General bed mobility comments: Supervision for safety.   Transfers Overall transfer level: Needs assistance Equipment used: None Transfers: Sit to/from Stand Sit to Stand: Supervision         General transfer comment: Supervision for safety. Performed X 4 throughout session.   Ambulation/Gait Ambulation/Gait assistance: Supervision Gait Distance (Feet): 40 Feet(20', 10', 10' ) Assistive device: None Gait Pattern/deviations: Step-through pattern;Decreased  stride length Gait velocity: Decreased    General Gait Details: Slow, guarded gait. Only able to tolerate short distance secondary to SOB and required seated rest in between gait trials. Oxygen sats dropping to 84% on 3L following ambulation and required extended time and pursed lip breathing to return to 91% on 3L. Educated about use of rollator for energy conservation at home.   Stairs            Wheelchair Mobility    Modified Rankin (Stroke Patients Only)       Balance Overall balance assessment: Mild deficits observed, not formally tested                                           Pertinent Vitals/Pain Pain Assessment: No/denies pain Faces Pain Scale: No hurt Pain Intervention(s): Monitored during session    Home Living Family/patient expects to be discharged to:: Private residence Living Arrangements: Spouse/significant other Available Help at Discharge: Family Type of Home: House Home Access: Level entry     Home Layout: One level Home Equipment: Environmental consultant - 2 wheels;Cane - single point;Shower seat;Bedside commode Additional Comments: "I got all that stuff after my back surgery"    Prior Function Level of Independence: Independent with assistive device(s)         Comments: Occasionally used cane for ambulation. Pt performs ADLs and light IADLs. Pt's husband performs majority of IADLs. Pt recently been visiting her father for assistance after he fell.     Hand Dominance   Dominant Hand: Right    Extremity/Trunk Assessment   Upper Extremity Assessment Upper Extremity Assessment: Defer to OT evaluation  Lower Extremity Assessment Lower Extremity Assessment: Generalized weakness    Cervical / Trunk Assessment Cervical / Trunk Assessment: Other exceptions Cervical / Trunk Exceptions: Increased body habitus  Communication   Communication: No difficulties  Cognition Arousal/Alertness: Awake/alert Behavior During Therapy: WFL for  tasks assessed/performed Overall Cognitive Status: Within Functional Limits for tasks assessed                                        General Comments General comments (skin integrity, edema, etc.): Educated pt about energy conservation techniques at home and pursed lip breathing.     Exercises     Assessment/Plan    PT Assessment Patient needs continued PT services  PT Problem List Cardiopulmonary status limiting activity;Decreased mobility;Decreased activity tolerance;Decreased strength;Decreased knowledge of precautions       PT Treatment Interventions DME instruction;Gait training;Functional mobility training;Therapeutic activities;Patient/family education;Therapeutic exercise    PT Goals (Current goals can be found in the Care Plan section)  Acute Rehab PT Goals Patient Stated Goal: "Feel stronger" PT Goal Formulation: With patient Time For Goal Achievement: 08/26/18 Potential to Achieve Goals: Good    Frequency Min 3X/week   Barriers to discharge        Co-evaluation               AM-PAC PT "6 Clicks" Daily Activity  Outcome Measure Difficulty turning over in bed (including adjusting bedclothes, sheets and blankets)?: None Difficulty moving from lying on back to sitting on the side of the bed? : A Little Difficulty sitting down on and standing up from a chair with arms (e.g., wheelchair, bedside commode, etc,.)?: A Little Help needed moving to and from a bed to chair (including a wheelchair)?: A Little Help needed walking in hospital room?: A Little Help needed climbing 3-5 steps with a railing? : A Lot 6 Click Score: 18    End of Session Equipment Utilized During Treatment: Gait belt;Oxygen Activity Tolerance: Other (comment);Patient limited by fatigue(decreased oxygen sats ) Patient left: in bed;with call bell/phone within reach;Other (comment)(transport staff in room ) Nurse Communication: Mobility status PT Visit Diagnosis: Other  abnormalities of gait and mobility (R26.89)    Time: 1211-1228 PT Time Calculation (min) (ACUTE ONLY): 17 min   Charges:   PT Evaluation $PT Eval Moderate Complexity: 1 Mod          Leighton Ruff, PT, DPT  Acute Rehabilitation Services  Pager: [phone]   Rudean Hitt 08/12/2018, 1:45 PM

## 2018-08-12 NOTE — Evaluation (Signed)
Occupational Therapy Evaluation Patient Details Name: Ann Fuller MRN: [id number] DOB: 01/27/1946 Today's Date: 08/12/2018    History of Present Illness 72 y.o. female with history of CHF, asthma, diabetes mellitus type 2, morbid obesity, anemia, and moderate aortic stenosis. Pt  recently admitted for chest pain and was discharged home with patient planning to follow-up with her cardiologist at [facility] and now presents with worsening SOB since discharge.    Clinical Impression   PTA, pt was living with her husband and was performing ADLs and light IADLs with husband performing majority of IADLs. Pt reports she is not on home O2. Pt currently requiring ADLs and functional mobility at supervision level. However, SpO2 dropping to 85% on 2L O2 during ADLs at sink. Pt requiring rest break to return to 90s. Pt would benefit from further acute OT to facilitate safe dc. Recommend dc to home with HHOT for further OT to optimize safety, independence with ADLs, and return to PLOF.      Follow Up Recommendations  Home health OT;Supervision/Assistance - 24 hour    Equipment Recommendations  None recommended by OT    Recommendations for Other Services PT consult     Precautions / Restrictions Precautions Precautions: Other (comment) Precaution Comments: watch O2 Restrictions Weight Bearing Restrictions: No      Mobility Bed Mobility               General bed mobility comments: Pt at EOB upon arrival  Transfers Overall transfer level: Needs assistance Equipment used: None Transfers: Sit to/from Stand Sit to Stand: Supervision         General transfer comment: supervision for safety    Balance Overall balance assessment: Mild deficits observed, not formally tested                                         ADL either performed or assessed with clinical judgement   ADL Overall ADL's : Needs assistance/impaired                                        General ADL Comments: Pt performing ADLs and fucntional mobility at supervision level. Pt performing grooming at sink and sponge bath of UB. MD arriving and requiring to speak with pt; NT notified that pt wanting to complete bath after breakfast. Pt SpO2 dropping to 85% on 2L O2 during grooming at sink. Pt not on home O2.      Vision         Perception     Praxis      Pertinent Vitals/Pain Pain Assessment: Faces Faces Pain Scale: No hurt Pain Intervention(s): Monitored during session     Hand Dominance Right   Extremity/Trunk Assessment Upper Extremity Assessment Upper Extremity Assessment: Overall WFL for tasks assessed   Lower Extremity Assessment Lower Extremity Assessment: Defer to PT evaluation   Cervical / Trunk Assessment Cervical / Trunk Assessment: Other exceptions Cervical / Trunk Exceptions: Increased body habitus   Communication Communication Communication: No difficulties   Cognition Arousal/Alertness: Awake/alert Behavior During Therapy: WFL for tasks assessed/performed Overall Cognitive Status: Within Functional Limits for tasks assessed  General Comments  SpO2 94-85% on 2L O2. SpO2 dropping during activity at sink. Educating pt on purse lip breathing    Exercises     Shoulder Instructions      Home Living Family/patient expects to be discharged to:: Private residence Living Arrangements: Spouse/significant other Available Help at Discharge: Family Type of Home: House Home Access: Level entry     Home Layout: One level     Bathroom Shower/Tub: Occupational psychologist: Standard Bathroom Accessibility: Yes   Home Equipment: Environmental consultant - 2 wheels;Cane - single point;Shower seat;Bedside commode   Additional Comments: "I got all that stuff after my back surgery"      Prior Functioning/Environment Level of Independence: Independent(used SPC at times)         Comments: Pt performs ADLs and light IADLs. Pt's husband performs majority of IADLs. Pt recently been visiting her father for assistance after he fell.        OT Problem List: Decreased activity tolerance;Impaired balance (sitting and/or standing);Decreased knowledge of use of DME or AE;Decreased knowledge of precautions;Cardiopulmonary status limiting activity      OT Treatment/Interventions: Self-care/ADL training;Therapeutic exercise;Energy conservation;DME and/or AE instruction;Therapeutic activities;Patient/family education    OT Goals(Current goals can be found in the care plan section) Acute Rehab OT Goals Patient Stated Goal: "Feel stronger" OT Goal Formulation: With patient Time For Goal Achievement: 08/26/18 Potential to Achieve Goals: Good  OT Frequency: Min 3X/week   Barriers to D/C:            Co-evaluation              AM-PAC PT "6 Clicks" Daily Activity     Outcome Measure Help from another person eating meals?: None Help from another person taking care of personal grooming?: A Little Help from another person toileting, which includes using toliet, bedpan, or urinal?: A Little Help from another person bathing (including washing, rinsing, drying)?: A Little Help from another person to put on and taking off regular upper body clothing?: None Help from another person to put on and taking off regular lower body clothing?: A Little 6 Click Score: 20   End of Session Equipment Utilized During Treatment: Oxygen(2L) Nurse Communication: Mobility status;Other (comment)(SpO2)  Activity Tolerance: Patient tolerated treatment well Patient left: in chair;with call bell/phone within reach;with nursing/sitter in room(with MD)  OT Visit Diagnosis: Unsteadiness on feet (R26.81);Muscle weakness (generalized) (M62.81)                Time: 9323-5573 OT Time Calculation (min): 21 min Charges:  OT General Charges $OT Visit: 1 Visit OT Evaluation $OT Eval Low Complexity:  [place], OTR/L Acute Rehab Pager: [phone] Office: [place] 08/12/2018, 11:40 AM

## 2018-08-12 NOTE — Progress Notes (Deleted)
  Echocardiogram 2D Echocardiogram has been performed.  [name] 08/12/2018, 1:15 PM

## 2018-08-12 NOTE — Progress Notes (Signed)
PROGRESS NOTE    Ann Fuller  JAS:[id number] DOB: 01/07/1946 DOA: 08/11/2018 PCP: Asencion Noble, MD    Brief Narrative: 72 y.o.femalewithhistory of CHF, AoS, asthma, DM2, morbid obesity, anemia, and moderate aortic stenosis who was discharged home 8/17 after an inpatient evaluation for chest pain at [facility]. She was planning to follow-up with her cardiologist at [facility] but returned to the ED w/progressive shortness of breath since discharge. She denied further chest pain.   In the ER the patient was hypoxic and required BiPAP support.  Her CXR suggested pulmonary edema. Exam revealed bilateral lower extremity edema and JVD.  Assessment & Plan:   Principal Problem:   Acute respiratory failure with hypoxia (HCC) Active Problems:   Type 2 diabetes mellitus (HCC)   Essential hypertension, benign   Syncope   Normocytic anemia   CHF (congestive heart failure) (HCC)   1-Acute hypoxic  respiratory failure;  Secondary to diastolic HF exacerbation, and component of asthma.  Continue with IV lasix.  New oxygen requirement.  Presume dry weight 234. Weight today 238---- Schedule nebulizer treatment.  Check ECHO>  Negative 1.3 L.   2-Acute Diastolic Heart failure exacerbation;  IV lasix.  Check ECHO.  Monitor renal function.  Negative 1.3 L.   Moderate aortic stenosis; follow at [facility]   Asthma, eosinophilia.  Continue with nebulizer.  Will schedule albuterol, ipratropium.   Anxiety; zoloft.   Hyperlipidemia; continue with statins.    DVT prophylaxis: Lovenox Code Status; full code.  Family Communication: care discussed with patient.  Disposition Plan: remain in the hospital , for IV lasix, nebulizer, new oxygen requirement.   Consultants:   none   Procedures:   ECHO; pending.    Antimicrobials: none   Subjective: She is breathing better than when she first came, but she is not at baseline. She got very short of breath with just going to the sink. She  denies chest pain currently.  She mention episode of chest pain , headaches and left arm pain, she was evaluated at [facility].   Objective: Vitals:   08/11/18 1630 08/11/18 2133 08/12/18 0200 08/12/18 0500  BP: (!) 117/57 (!) 97/41 (!) 124/46   Pulse: 74 78 77   Resp: 18 17 18    Temp: 98.4 F (36.9 C) 98.1 F (36.7 C) 98.4 F (36.9 C)   TempSrc: Oral Oral Oral   SpO2: 97% 95%    Weight:    108.5 kg  Height:        Intake/Output Summary (Last 24 hours) at 08/12/2018 0857 Last data filed at 08/12/2018 0600 Gross per 24 hour  Intake 360 ml  Output -  Net 360 ml   Filed Weights   08/11/18 0032 08/11/18 1600 08/12/18 0500  Weight: 110.9 kg 108.5 kg 108.5 kg    Examination:  General exam: Appears calm and comfortable , morbid obese.  Respiratory system: mild tachypnea, bilateral crackles  Cardiovascular system: S1 & S2 heard, RRR. No JVD, murmurs, rubs, gallops or clicks. No pedal edema. Gastrointestinal system: Abdomen is nondistended, soft and nontender. No organomegaly or masses felt. Normal bowel sounds heard. Central nervous system: Alert and oriented. No focal neurological deficits. Extremities: Symmetric 5 x 5 power. Skin: No rashes, lesions or ulcers Psychiatry: Judgement and insight appear normal. Mood & affect appropriate.     Data Reviewed: I have personally reviewed following labs and imaging studies  CBC: Recent Labs  Lab 08/08/18 1729 08/11/18 0039 08/11/18 0607  WBC 7.4 11.5* 11.8*  NEUTROABS  --   --  9.4*  HGB 8.8* 9.0* 8.8*  HCT 27.7* 29.3* 27.9*  MCV 89.4 91.6 90.6  PLT 249 245 841   Basic Metabolic Panel: Recent Labs  Lab 08/08/18 1729 08/11/18 0039 08/11/18 0607 08/12/18 0357  NA 131* 134* 136 135  K 3.8 4.3 4.1 4.1  CL 95* 96* 98 98  CO2 28 27 28 31   GLUCOSE 126* 82 102* 87  BUN 21 16 15 20   CREATININE 1.07* 0.97 0.97 1.32*  CALCIUM 8.7* 9.3 9.2 8.9  MG  --   --  1.8 2.1   GFR: Estimated Creatinine Clearance: 43.4  mL/min (A) (by C-G formula based on SCr of 1.32 mg/dL (H)). Liver Function Tests: Recent Labs  Lab 08/11/18 0607 08/12/18 0357  AST 14* 11*  ALT 13 11  ALKPHOS 54 50  BILITOT 0.9 0.9  PROT 7.3 6.7  ALBUMIN 3.4* 3.0*   No results for input(s): LIPASE, AMYLASE in the last 168 hours. No results for input(s): AMMONIA in the last 168 hours. Coagulation Profile: No results for input(s): INR, PROTIME in the last 168 hours. Cardiac Enzymes: Recent Labs  Lab 08/08/18 1729 08/08/18 2052 08/09/18 0237 08/11/18 0607 08/11/18 1152  TROPONINI <0.03 <0.03 <0.03 <0.03 <0.03   BNP (last 3 results) No results for input(s): PROBNP in the last 8760 hours. HbA1C: No results for input(s): HGBA1C in the last 72 hours. CBG: Recent Labs  Lab 08/11/18 0754 08/11/18 1154 08/11/18 1651 08/11/18 2134 08/12/18 0752  GLUCAP 108* 112* 109* 132* 84   Lipid Profile: No results for input(s): CHOL, HDL, LDLCALC, TRIG, CHOLHDL, LDLDIRECT in the last 72 hours. Thyroid Function Tests: Recent Labs    08/11/18 0607  TSH 2.135   Anemia Panel: No results for input(s): VITAMINB12, FOLATE, FERRITIN, TIBC, IRON, RETICCTPCT in the last 72 hours. Sepsis Labs: No results for input(s): PROCALCITON, LATICACIDVEN in the last 168 hours.  No results found for this or any previous visit (from the past 240 hour(s)).       Radiology Studies: Dg Chest Portable 1 View  Result Date: 08/11/2018 CLINICAL DATA:  Shortness of breath and chest pain tonight. EXAM: PORTABLE CHEST 1 VIEW COMPARISON:  08/08/2018 FINDINGS: Shallow inspiration. Mild cardiac enlargement. Pulmonary vascular congestion. Bilateral perihilar infiltration likely representing edema although multifocal pneumonia could also have this appearance. No blunting of costophrenic angles. No pneumothorax. Degenerative changes in the spine and shoulders. IMPRESSION: Cardiac enlargement with pulmonary vascular congestion and bilateral perihilar edema.  Developing since previous study. Electronically Signed   By: Lucienne Capers M.D.   On: 08/11/2018 00:48        Scheduled Meds: . aspirin EC  81 mg Oral Daily  . atorvastatin  40 mg Oral QHS  . carvedilol  3.125 mg Oral BID WC  . dicyclomine  10 mg Oral TID AC  . doxazosin  4 mg Oral Daily  . enoxaparin (LOVENOX) injection  40 mg Subcutaneous Daily  . exenatide  10 mcg Subcutaneous BID WC  . furosemide  40 mg Intravenous BID  . insulin aspart  0-9 Units Subcutaneous TID WC  . insulin glargine  26 Units Subcutaneous q morning - 10a  . magnesium oxide  400 mg Oral Daily  . mometasone-formoterol  2 puff Inhalation BID  . montelukast  10 mg Oral QHS  . pantoprazole  40 mg Oral BID AC  . sertraline  50 mg Oral Daily  . vitamin C  500 mg Oral Daily   Continuous Infusions:   LOS: 1 day  Time spent: 35 minutes.     Elmarie Shiley, MD [facility] Pager [phone]  If 7PM-7AM, please contact night-coverage [email or web address] Password [facility] 08/12/2018, 8:57 AM

## 2018-08-12 NOTE — Progress Notes (Signed)
  Echocardiogram 2D Echocardiogram has been performed.  Ann Fuller 08/12/2018, 1:13 PM

## 2018-08-12 NOTE — Telephone Encounter (Signed)
Patient called to cancel procedure scheduled for 08/21/18. She is currently admitted to [facility] with heart failure. She reports she will call us once she has recovered. Called carolyn and LMOVM cancelling procedure/pre-op. FYI to Dr. Gala Romney

## 2018-08-13 ENCOUNTER — Inpatient Hospital Stay (HOSPITAL_COMMUNITY): Payer: Medicare Other

## 2018-08-13 DIAGNOSIS — Z794 Long term (current) use of insulin: Secondary | ICD-10-CM

## 2018-08-13 DIAGNOSIS — I509 Heart failure, unspecified: Secondary | ICD-10-CM

## 2018-08-13 DIAGNOSIS — J9601 Acute respiratory failure with hypoxia: Principal | ICD-10-CM

## 2018-08-13 DIAGNOSIS — I10 Essential (primary) hypertension: Secondary | ICD-10-CM

## 2018-08-13 DIAGNOSIS — E119 Type 2 diabetes mellitus without complications: Secondary | ICD-10-CM

## 2018-08-13 LAB — GLUCOSE, CAPILLARY
Glucose-Capillary: 132 mg/dL — ABNORMAL HIGH (ref 70–99)
Glucose-Capillary: 89 mg/dL (ref 70–99)
Glucose-Capillary: 119 mg/dL — ABNORMAL HIGH (ref 70–99)
Glucose-Capillary: 115 mg/dL — ABNORMAL HIGH (ref 70–99)

## 2018-08-13 LAB — BASIC METABOLIC PANEL
ANION GAP: 6 (ref 5–15)
BUN: 25 mg/dL — ABNORMAL HIGH (ref 8–23)
CO2: 31 mmol/L (ref 22–32)
Calcium: 8.6 mg/dL — ABNORMAL LOW (ref 8.9–10.3)
Chloride: 95 mmol/L — ABNORMAL LOW (ref 98–111)
Creatinine, Ser: 1.29 mg/dL — ABNORMAL HIGH (ref 0.44–1.00)
GFR calc Af Amer: 47 mL/min — ABNORMAL LOW (ref >60)
GFR, EST NON AFRICAN AMERICAN: 41 mL/min — AB (ref >60)
GLUCOSE: 126 mg/dL — AB (ref 70–99)
POTASSIUM: 4 mmol/L (ref 3.5–5.1)
Sodium: 132 mmol/L — ABNORMAL LOW (ref 135–145)

## 2018-08-13 MED ORDER — FUROSEMIDE 10 MG/ML IJ SOLN
60.0000 mg | Freq: Two times a day (BID) | INTRAMUSCULAR | Status: DC
  Administered 2018-08-13 – 2018-08-15 (×5): 60 mg via INTRAVENOUS
  Filled 2018-08-13 (×5): qty 6

## 2018-08-13 MED ORDER — IPRATROPIUM-ALBUTEROL 0.5-2.5 (3) MG/3ML IN SOLN
3.0000 mL | Freq: Three times a day (TID) | RESPIRATORY_TRACT | Status: DC
  Administered 2018-08-13 – 2018-08-14 (×2): 3 mL via RESPIRATORY_TRACT
  Filled 2018-08-13 (×2): qty 3

## 2018-08-13 MED ORDER — ENOXAPARIN SODIUM 60 MG/0.6ML ~~LOC~~ SOLN
50.0000 mg | Freq: Every day | SUBCUTANEOUS | Status: DC
  Administered 2018-08-13 – 2018-08-15 (×3): 50 mg via SUBCUTANEOUS
  Filled 2018-08-13 (×3): qty 0.6

## 2018-08-13 MED ORDER — GUAIFENESIN-DM 100-10 MG/5ML PO SYRP
5.0000 mL | ORAL_SOLUTION | ORAL | Status: DC | PRN
  Administered 2018-08-13 – 2018-08-15 (×3): 5 mL via ORAL
  Filled 2018-08-13 (×3): qty 5

## 2018-08-13 NOTE — Progress Notes (Signed)
PROGRESS NOTE  Ann Fuller GMW:[id number] DOB: 12/19/1946 DOA: 08/11/2018 PCP: Asencion Noble, MD   LOS: 2 days   Brief Narrative / Interim history: 72 y.o.femalewithhistory of CHF,AoS,asthma,DM2, morbid obesity, anemia,andmoderate aortic stenosis who was discharged home8/17 after an inpatient evaluation for chest pain at [facility]. She wasplanning to follow-up with her cardiologist at [facility] returned to the ED w/progressiveshortness of breath since discharge.She denied further chest pain.In the ERthepatient was hypoxic andrequiredBiPAPsupport. Her CXR suggested pulmonary edema.Exam revealedbilateral lower extremity edemaand JVD.  Assessment & Plan: Principal Problem:   Acute respiratory failure with hypoxia (HCC) Active Problems:   Type 2 diabetes mellitus (HCC)   Essential hypertension, benign   Syncope   Normocytic anemia   CHF (congestive heart failure) (HCC)   Acute hypoxic  respiratory failure -Secondary to diastolic HF exacerbation, and component of asthma.  -Continue with IV lasix.  Renal function is stable -Net -2 L, will increase dose of Lasix today -Weight has improved from 2 44-2 39 however has remained stable in the last 3 days.  Increase Lasix as above.  Acute Diastolic Heart failure exacerbation -Continue IV diuresis, increase Lasix as above as her weight has had little change in the last few days  Moderate aortic stenosis -followed at [facility]   Asthma, eosinophilia -Continue with nebulizer.  -No wheezing  Anxiety -continue Zoloft    Hyperlipidemia  -continue with statins.    DVT prophylaxis: Lovenox Code Status: Full code Family Communication: no family at bedside Disposition Plan: home when ready  Consultants:   None   Procedures:   2D echo Study Conclusions - Left ventricle: Left ventricular diastolic function parameters were normal. Doppler parameters are consistent with elevated ventricular end-diastolic filling  pressure. - Aortic valve: Poorly visualized due to breast implants. Cannot tell if tri leaflet Calcified. CW doppler suggest mild Aortic Stenosis LVOT is narrow There was mild regurgitation. - Mitral valve: Severely calcified annulus. - Atrial septum: No defect or patent foramen ovale was identified. - Pulmonary arteries: PA peak pressure: 33 mm Hg (S).  Antimicrobials:  None    Subjective: - no chest pain, ongoing but improved shortness of breath, no abdominal pain, nausea or vomiting.   Objective: Vitals:   08/13/18 0852 08/13/18 0853 08/13/18 0936 08/13/18 1224  BP:   (!) 110/44 (!) 144/66  Pulse:   77 85  Resp:      Temp:    98.3 F (36.8 C)  TempSrc:    Oral  SpO2: 95% 95% 99% 98%  Weight:      Height:        Intake/Output Summary (Last 24 hours) at 08/13/2018 1414 Last data filed at 08/13/2018 0956 Gross per 24 hour  Intake 480 ml  Output 1150 ml  Net -670 ml   Filed Weights   08/11/18 1600 08/12/18 0500 08/13/18 0352  Weight: 108.5 kg 108.5 kg 108.4 kg    Examination:  Constitutional: NAD Eyes: lids and conjunctivae normal ENMT: Mucous membranes are moist. No oropharyngeal exudates Respiratory: clear to auscultation bilaterally, no wheezing, no crackles. Normal respiratory effort.  Cardiovascular: Regular rate and rhythm, no murmurs / rubs / gallops. 1-2+ LE edema. 2+ pedal pulses Abdomen: no tenderness. Bowel sounds positive.  Musculoskeletal: no clubbing / cyanosis. Skin: no rashes, lesions, ulcers. No induration Neurologic: CN 2-12 grossly intact. Strength 5/5 in all 4.  Psychiatric: Normal judgment and insight. Alert and oriented x 3. Normal mood.    Data Reviewed: I have independently reviewed following labs and imaging studies  CBC: Recent Labs  Lab 08/08/18 1729 08/11/18 0039 08/11/18 0607  WBC 7.4 11.5* 11.8*  NEUTROABS  --   --  9.4*  HGB 8.8* 9.0* 8.8*  HCT 27.7* 29.3* 27.9*  MCV 89.4 91.6 90.6  PLT 249 245 128   Basic Metabolic  Panel: Recent Labs  Lab 08/08/18 1729 08/11/18 0039 08/11/18 0607 08/12/18 0357 08/13/18 0426  NA 131* 134* 136 135 132*  K 3.8 4.3 4.1 4.1 4.0  CL 95* 96* 98 98 95*  CO2 28 27 28 31 31   GLUCOSE 126* 82 102* 87 126*  BUN 21 16 15 20  25*  CREATININE 1.07* 0.97 0.97 1.32* 1.29*  CALCIUM 8.7* 9.3 9.2 8.9 8.6*  MG  --   --  1.8 2.1  --    GFR: Estimated Creatinine Clearance: 44.4 mL/min (A) (by C-G formula based on SCr of 1.29 mg/dL (H)). Liver Function Tests: Recent Labs  Lab 08/11/18 0607 08/12/18 0357  AST 14* 11*  ALT 13 11  ALKPHOS 54 50  BILITOT 0.9 0.9  PROT 7.3 6.7  ALBUMIN 3.4* 3.0*   No results for input(s): LIPASE, AMYLASE in the last 168 hours. No results for input(s): AMMONIA in the last 168 hours. Coagulation Profile: No results for input(s): INR, PROTIME in the last 168 hours. Cardiac Enzymes: Recent Labs  Lab 08/08/18 1729 08/08/18 2052 08/09/18 0237 08/11/18 0607 08/11/18 1152  TROPONINI <0.03 <0.03 <0.03 <0.03 <0.03   BNP (last 3 results) No results for input(s): PROBNP in the last 8760 hours. HbA1C: No results for input(s): HGBA1C in the last 72 hours. CBG: Recent Labs  Lab 08/12/18 1116 08/12/18 1635 08/12/18 2117 08/13/18 0757 08/13/18 1125  GLUCAP 149* 132* 95 132* 89   Lipid Profile: No results for input(s): CHOL, HDL, LDLCALC, TRIG, CHOLHDL, LDLDIRECT in the last 72 hours. Thyroid Function Tests: Recent Labs    08/11/18 0607  TSH 2.135   Anemia Panel: No results for input(s): VITAMINB12, FOLATE, FERRITIN, TIBC, IRON, RETICCTPCT in the last 72 hours. Urine analysis:    Component Value Date/Time   COLORURINE YELLOW 04/06/2015 1935   APPEARANCEUR CLEAR 04/06/2015 1935   LABSPEC 1.015 04/06/2015 1935   PHURINE 5.5 04/06/2015 1935   GLUCOSEU NEGATIVE 04/06/2015 1935   GLUCOSEU NEG mg/dL 11/21/2010 1308   HGBUR TRACE (A) 04/06/2015 1935   BILIRUBINUR NEGATIVE 04/06/2015 1935   KETONESUR NEGATIVE 04/06/2015 1935    PROTEINUR NEGATIVE 04/06/2015 1935   UROBILINOGEN 0.2 04/06/2015 1935   NITRITE NEGATIVE 04/06/2015 1935   LEUKOCYTESUR NEGATIVE 04/06/2015 1935   Sepsis Labs: Invalid input(s): PROCALCITONIN, LACTICIDVEN  No results found for this or any previous visit (from the past 240 hour(s)).    Radiology Studies: Dg Chest 2 View  Result Date: 08/13/2018 CLINICAL DATA:  Chest pain and dyspnea. History of CHF, asthma, breast malignancy, nonsmoker. EXAM: CHEST - 2 VIEW COMPARISON:  Portable chest x-ray of August 11, 2018 FINDINGS: The lungs are better inflated today. The patient is positioned better as well. The interstitial markings remain increased greatest in the left mid and lower lung. There is no pleural effusion and no pneumothorax. The heart is normal in size. The central pulmonary vascularity is prominent. There is calcification in the wall of the aortic arch. IMPRESSION: Improved appearance of both lungs with improved aeration. There is persistent increased interstitial density greatest in the left mid and lower lung which may reflect asymmetric pulmonary edema or interstitial pneumonia. Thoracic aortic atherosclerosis. Electronically Signed   By: David  Martinique  M.D.   On: 08/13/2018 10:30     Scheduled Meds: . aspirin EC  81 mg Oral Daily  . atorvastatin  40 mg Oral QHS  . carvedilol  3.125 mg Oral BID WC  . dicyclomine  10 mg Oral TID AC  . doxazosin  4 mg Oral Daily  . enoxaparin (LOVENOX) injection  50 mg Subcutaneous Daily  . exenatide  10 mcg Subcutaneous BID WC  . furosemide  60 mg Intravenous BID  . insulin aspart  0-9 Units Subcutaneous TID WC  . insulin glargine  26 Units Subcutaneous q morning - 10a  . ipratropium-albuterol  3 mL Nebulization Q6H  . magnesium oxide  400 mg Oral Daily  . mometasone-formoterol  2 puff Inhalation BID  . montelukast  10 mg Oral QHS  . pantoprazole  40 mg Oral BID AC  . sertraline  50 mg Oral Daily  . vitamin C  500 mg Oral Daily   Continuous  Infusions:   Marzetta Board, MD, PhD [facility] Pager [phone]  If 7PM-7AM, please contact night-coverage [email or web address] Password [facility] 08/13/2018, 2:14 PM

## 2018-08-13 NOTE — Progress Notes (Signed)
Physical Therapy Treatment Patient Details Name: Ann Fuller MRN: [id number] DOB: 11-10-1946 Today's Date: 08/13/2018    History of Present Illness 72 y.o. female with history of CHF, asthma, diabetes mellitus type 2, morbid obesity, anemia, and moderate aortic stenosis. Pt  recently admitted for chest pain and was discharged home with patient planning to follow-up with her cardiologist at [facility] and now presents with worsening SOB since discharge.    PT Comments    Patient progressing with therapy increased distance this visit, desats to low 80's quickly on RA, ambulating first on 2L however desats, remained over 90% on 3L. 1x seated rest break after 40 feet due to leg weakness/buckling. Agree with HHPT recs.     Follow Up Recommendations  Home health PT;Supervision for mobility/OOB     Equipment Recommendations  Other (comment)(rollator)    Recommendations for Other Services       Precautions / Restrictions Precautions Precautions: Other (comment) Precaution Comments: watch O2 Restrictions Weight Bearing Restrictions: No    Mobility  Bed Mobility Overal bed mobility: Needs Assistance Bed Mobility: Supine to Sit;Sit to Supine     Supine to sit: Supervision     General bed mobility comments: Supervision for safety.   Transfers Overall transfer level: Needs assistance Equipment used: None Transfers: Sit to/from Stand Sit to Stand: Supervision         General transfer comment: Supervision for safety. Performed X 4 throughout session.   Ambulation/Gait Ambulation/Gait assistance: Supervision Gait Distance (Feet): 80 Feet Assistive device: None Gait Pattern/deviations: Step-through pattern;Decreased stride length Gait velocity: Decreased    General Gait Details: Slow, guarded gait. Only able to tolerate short distance secondary to SOB and required seated rest in between gait trials. Oxygen sats dropping to 84% on 3L following ambulation and required  extended time and pursed lip breathing to return to 91% on 3L. Educated about use of rollator for energy conservation at home.    Stairs             Wheelchair Mobility    Modified Rankin (Stroke Patients Only)       Balance Overall balance assessment: Mild deficits observed, not formally tested                                          Cognition Arousal/Alertness: Awake/alert Behavior During Therapy: WFL for tasks assessed/performed Overall Cognitive Status: Within Functional Limits for tasks assessed                                        Exercises      General Comments        Pertinent Vitals/Pain Pain Assessment: No/denies pain    Home Living                      Prior Function            PT Goals (current goals can now be found in the care plan section) Acute Rehab PT Goals Patient Stated Goal: "Feel stronger" PT Goal Formulation: With patient Time For Goal Achievement: 08/26/18 Potential to Achieve Goals: Good Progress towards PT goals: Progressing toward goals    Frequency    Min 3X/week      PT Plan Current plan remains appropriate    Co-evaluation  AM-PAC PT "6 Clicks" Daily Activity  Outcome Measure  Difficulty turning over in bed (including adjusting bedclothes, sheets and blankets)?: None Difficulty moving from lying on back to sitting on the side of the bed? : A Little Difficulty sitting down on and standing up from a chair with arms (e.g., wheelchair, bedside commode, etc,.)?: A Little Help needed moving to and from a bed to chair (including a wheelchair)?: A Little Help needed walking in hospital room?: A Little Help needed climbing 3-5 steps with a railing? : A Lot 6 Click Score: 18    End of Session Equipment Utilized During Treatment: Gait belt;Oxygen Activity Tolerance: Other (comment);Patient limited by fatigue Patient left: in bed;with call bell/phone within  reach;Other (comment) Nurse Communication: Mobility status PT Visit Diagnosis: Other abnormalities of gait and mobility (R26.89)     Time: 5366-4403 PT Time Calculation (min) (ACUTE ONLY): 20 min  Charges:  $Gait Training: 8-22 mins                     Reinaldo Berber, PT, DPT Acute Rehab Services Pager: [phone]     Reinaldo Berber 08/13/2018, 4:23 PM

## 2018-08-13 NOTE — Plan of Care (Signed)
Nutrition Education Note  RD consulted for nutrition education regarding CHF and diabetes   Lab Results  Component Value Date   HGBA1C 6.2 (H) 02/19/2018   PTA DM medications are 10 mcg exenatide BID, 26 units insulin glargine daily, and 6 units insulin lispro BID.   Labs reviewed: CBGS: 89-132 (inpatient orders for glycemic control are 10 mcg exenatide, 0-9 units insulin aspart TID, and 26 units insulin glargine daily).    Spoke with pt, daughter, and husband at bedside. Pt reports her husband does most of the cooking, but they also eat out almost daily. Pt usually consumes 2 meals per day and a snacks. Breakfast usually consists of bacon, toast, eggs and grits and Dinner is a meat, starch, and vegetable. She typically snacks on fruit. Pt and husband enjoy going to a local diner for breakfast foods; pt admits to putting salt on her eggs. She tries to limit salt in cooking, but will often "put a pinch" into dishes. She also seasons with Mrs Deliah Boston, a specialty spice seasoning ("it comes both in salt and no salt"), and garlic.   Pt shares that she sees a cardiologist, pulomonogist, and PCP. PCP (Dr. Willey Blade) manages DM. Pt also has a case Freight forwarder from [employer]; pt monitors her weight daily, which is electronically transmitted to case Freight forwarder. Pt reports her weight has been increasing related to stress and heat. Most of conversation was spent reviewing DM and heart failure management.   RD provided "Heart Healthy/ Consistent Carbohydrate Nutrition Therapy" handout from the Academy of [employer] and Dietetics. Reviewed patient's dietary recall. Provided examples on ways to decrease sodium intake in diet. Discouraged intake of processed foods and use of salt shaker. Encouraged fresh fruits and vegetables as well as whole grain sources of carbohydrates to maximize fiber intake.   RD discussed why it is important for patient to adhere to diet recommendations, and emphasized the role of fluids, foods  to avoid, and importance of weighing self daily.   Discussed different food groups and their effects on blood sugar, emphasizing carbohydrate-containing foods. Provided list of carbohydrates and recommended serving sizes of common foods.  Discussed importance of controlled and consistent carbohydrate intake throughout the day. Provided examples of ways to balance meals/snacks and encouraged intake of high-fiber, whole grain complex carbohydrates. Teach back method used.  Expect fair compliance.  Body mass index is 48.27 kg/m. Pt meets criteria for extreme obesity, class III based on current BMI.  Current diet order is Heart Healthy/ Carb Modified with 1200 ml fluid restriction, patient is consuming approximately 75% of meals at this time. Labs and medications reviewed. No further nutrition interventions warranted at this time. RD contact information provided. If additional nutrition issues arise, please re-consult RD.   Rayn Shorb A. Jimmye Norman, RD, LDN, CDE Pager: [phone] After hours Pager: [phone]

## 2018-08-13 NOTE — Progress Notes (Signed)
Patient c/o of coughing. Ordered Robitussin.  Shaneka Efaw, RN

## 2018-08-14 ENCOUNTER — Inpatient Hospital Stay (HOSPITAL_COMMUNITY): Admission: RE | Admit: 2018-08-14 | Payer: Medicare Other | Source: Ambulatory Visit

## 2018-08-14 LAB — BASIC METABOLIC PANEL
Anion gap: 9 (ref 5–15)
BUN: 21 mg/dL (ref 8–23)
CO2: 34 mmol/L — ABNORMAL HIGH (ref 22–32)
Calcium: 9.4 mg/dL (ref 8.9–10.3)
Chloride: 91 mmol/L — ABNORMAL LOW (ref 98–111)
Creatinine, Ser: 0.98 mg/dL (ref 0.44–1.00)
GFR, EST NON AFRICAN AMERICAN: 57 mL/min — AB (ref >60)
Glucose, Bld: 100 mg/dL — ABNORMAL HIGH (ref 70–99)
POTASSIUM: 3.9 mmol/L (ref 3.5–5.1)
Sodium: 134 mmol/L — ABNORMAL LOW (ref 135–145)

## 2018-08-14 LAB — GLUCOSE, CAPILLARY
GLUCOSE-CAPILLARY: 94 mg/dL (ref 70–99)
GLUCOSE-CAPILLARY: 96 mg/dL (ref 70–99)
Glucose-Capillary: 76 mg/dL (ref 70–99)
Glucose-Capillary: 121 mg/dL — ABNORMAL HIGH (ref 70–99)

## 2018-08-14 MED ORDER — IPRATROPIUM-ALBUTEROL 0.5-2.5 (3) MG/3ML IN SOLN
3.0000 mL | Freq: Two times a day (BID) | RESPIRATORY_TRACT | Status: DC
  Administered 2018-08-14 – 2018-08-15 (×2): 3 mL via RESPIRATORY_TRACT
  Filled 2018-08-14 (×2): qty 3

## 2018-08-14 MED ORDER — METOLAZONE 2.5 MG PO TABS
2.5000 mg | ORAL_TABLET | Freq: Once | ORAL | Status: AC
  Administered 2018-08-14: 2.5 mg via ORAL
  Filled 2018-08-14: qty 1

## 2018-08-14 NOTE — Progress Notes (Signed)
Occupational Therapy Treatment and Discharge Patient Details Name: MARCHA LICKLIDER MRN: [id number] DOB: [birth year] Today's Date: 08/14/2018    History of present illness 72 y.o. female with history of CHF, asthma, diabetes mellitus type 2, morbid obesity, anemia, and moderate aortic stenosis. Pt  recently admitted for chest pain and was discharged home with patient planning to follow-up with her cardiologist at [facility] and now presents with worsening SOB since discharge.   OT comments  This 72 yo female seen for treatment session today with focus on energy conservation. Did have pt ambulate with rollator (going over safety with its use). Pt on RA at rest 95% but with ambulation with rollator dropped to 83%, turned pt up to 1 liter and pt up to 92%. Left pt on 2 liters when I left room. Pt will need O2 for home and rollator would really help her to be more mobile and safe at same time. No further acute OT needs but still feel HHOT would be beneficial. Acute OT will sign off.  Follow Up Recommendations  Home health OT;Supervision/Assistance - 24 hour    Equipment Recommendations  Other (comment)(rollator)       Precautions / Restrictions Precautions Precaution Comments: watch O2 Restrictions Weight Bearing Restrictions: No       Mobility Bed Mobility               General bed mobility comments: Pt sitting on EOB upon my arrival  Transfers Overall transfer level: Needs assistance Equipment used: 4-wheeled walker Transfers: Sit to/from Stand Sit to Stand: Supervision         General transfer comment: first time she had used a rollator--cues on safety with it        ADL either performed or assessed with clinical judgement   ADL Overall ADL's : Needs assistance/impaired                                       General ADL Comments: Pt reports her husband helps her with any problems she has with basic ADLs. She has a shower seat to sit on and uses  it. We talked about energy conservation techniques when it comes to hot weather/situations and how to combat this. Energy conservation handout provided. Educated on purse lipped breathing and had pt perform due to decreased sats on RA with ambulation     Vision Patient Visual Report: No change from baseline            Cognition Arousal/Alertness: Awake/alert Behavior During Therapy: WFL for tasks assessed/performed Overall Cognitive Status: Within Functional Limits for tasks assessed                                                     Pertinent Vitals/ Pain       Pain Assessment: No/denies pain         Frequency  Min 3X/week        Progress Toward Goals  OT Goals(current goals can now be found in the care plan section)  Progress towards OT goals: (All acute OT education completed)     Plan Discharge plan remains appropriate       AM-PAC PT "6 Clicks" Daily Activity     Outcome Measure   Help  from another person eating meals?: None Help from another person taking care of personal grooming?: A Little Help from another person toileting, which includes using toliet, bedpan, or urinal?: A Little Help from another person bathing (including washing, rinsing, drying)?: A Little Help from another person to put on and taking off regular upper body clothing?: A Little Help from another person to put on and taking off regular lower body clothing?: A Little 6 Click Score: 19    End of Session Equipment Utilized During Treatment: Oxygen(1 and 2 liters)  OT Visit Diagnosis: Unsteadiness on feet (R26.81);Muscle weakness (generalized) (M62.81)   Activity Tolerance (pt's O2 dropped on RA into low 80's)   Patient Left in bed;with call bell/phone within reach;with family/visitor present   Nurse Communication (sats did drop into low 80's on RA)        Time: 7915-0569 OT Time Calculation (min): 37 min  Charges: OT General Charges $OT Visit: 1  Visit OT Treatments $Self Care/Home Management : 23-37 mins [place] Circle, OTR/L [phone] 08/14/2018

## 2018-08-14 NOTE — Progress Notes (Signed)
PROGRESS NOTE  Ann Fuller NIO:[id number] DOB: [birth year] DOA: 08/11/2018 PCP: Asencion Noble, MD   LOS: 3 days   Brief Narrative / Interim history: 72 y.o.femalewithhistory of CHF,AoS,asthma,DM2, morbid obesity, anemia,andmoderate aortic stenosis who was discharged home8/17 after an inpatient evaluation for chest pain at [facility]. She wasplanning to follow-up with her cardiologist at [facility] returned to the ED w/progressiveshortness of breath since discharge.She denied further chest pain.In the ERthepatient was hypoxic andrequiredBiPAPsupport. Her CXR suggested pulmonary edema.Exam revealedbilateral lower extremity edemaand JVD.  Assessment & Plan: Principal Problem:   Acute respiratory failure with hypoxia (HCC) Active Problems:   Type 2 diabetes mellitus (HCC)   Essential hypertension, benign   Syncope   Normocytic anemia   CHF (congestive heart failure) (HCC)   Acute hypoxic respiratory failure -Secondary to diastolic HF exacerbation, and component of asthma.  -Continue with IV lasix.  Renal function is stable -Lasix dose was increased on 8/21, improved urine output over the last 24 hours and she is now net -5.2 L.  Weight has decreased from 244 admission to 236.  She is still feeling ongoing shortness of breath, ambulate in the hallway on room air and continued to have oxygen desaturation into the mid 80s.  She felt quite short of breath with that and required to stop to catch her breath, she is not yet back to baseline, I will continue IV diuresis and add metolazone x1 today  Acute Diastolic Heart failure exacerbation -Continue IV diuresis, fluid status overall improving however she is still quite symptomatic, add metolazone x1 today  Moderate aortic stenosis -followed at [facility]   Asthma, eosinophilia -Continue with nebulizer.  -She has no wheezing, respiratory difficulties likely due to fluid overload as evidenced above  Anxiety -continue Zoloft     Hyperlipidemia  -continue with statins.    DVT prophylaxis: Lovenox Code Status: Full code Family Communication: no family at bedside Disposition Plan: home when ready, likely 24 hours  Consultants:   None   Procedures:   2D echo Study Conclusions - Left ventricle: Left ventricular diastolic function parameters were normal. Doppler parameters are consistent with elevated ventricular end-diastolic filling pressure. - Aortic valve: Poorly visualized due to breast implants. Cannot tell if tri leaflet Calcified. CW doppler suggest mild Aortic Stenosis LVOT is narrow There was mild regurgitation. - Mitral valve: Severely calcified annulus. - Atrial septum: No defect or patent foramen ovale was identified. - Pulmonary arteries: PA peak pressure: 33 mm Hg (S).  Antimicrobials:  None    Subjective: -Feels a little bit better, however upon ambulation felt quite dyspneic and lightheaded.  Denies chest pain.  Objective: Vitals:   08/14/18 0523 08/14/18 0846 08/14/18 0921 08/14/18 1146  BP:  (!) 136/54  140/60  Pulse:  89 90 85  Resp:   18   Temp:    98.1 F (36.7 C)  TempSrc:    Oral  SpO2:   92% 96%  Weight: 107 kg     Height:        Intake/Output Summary (Last 24 hours) at 08/14/2018 1418 Last data filed at 08/14/2018 0519 Gross per 24 hour  Intake 360 ml  Output 3550 ml  Net -3190 ml   Filed Weights   08/12/18 0500 08/13/18 0352 08/14/18 0523  Weight: 108.5 kg 108.4 kg 107 kg    Examination:  Constitutional: No distress at rest, but also observed with ambulation and appears in mild distress due to worsening breathing Eyes: l no scleral icterus ENMT: Mucous membranes are moist.  No oropharyngeal exudates Respiratory: Clear to auscultation bilaterally without wheezing, crackles.  Obesity Exam overall difficult Cardiovascular: Regular rate and rhythm, no murmurs heard.  1+ lower extremity edema. Abdomen: Soft, nontender, nondistended, positive bowel  sounds Skin: No rashes seen Neurologic: Nonfocal Psychiatric: Normal judgment and insight. Alert and oriented x 3. Normal mood.    Data Reviewed: I have independently reviewed following labs and imaging studies   CBC: Recent Labs  Lab 08/08/18 1729 08/11/18 0039 08/11/18 0607  WBC 7.4 11.5* 11.8*  NEUTROABS  --   --  9.4*  HGB 8.8* 9.0* 8.8*  HCT 27.7* 29.3* 27.9*  MCV 89.4 91.6 90.6  PLT 249 245 286   Basic Metabolic Panel: Recent Labs  Lab 08/11/18 0039 08/11/18 0607 08/12/18 0357 08/13/18 0426 08/14/18 1031  NA 134* 136 135 132* 134*  K 4.3 4.1 4.1 4.0 3.9  CL 96* 98 98 95* 91*  CO2 27 28 31 31  34*  GLUCOSE 82 102* 87 126* 100*  BUN 16 15 20  25* 21  CREATININE 0.97 0.97 1.32* 1.29* 0.98  CALCIUM 9.3 9.2 8.9 8.6* 9.4  MG  --  1.8 2.1  --   --    GFR: Estimated Creatinine Clearance: 57.9 mL/min (by C-G formula based on SCr of 0.98 mg/dL). Liver Function Tests: Recent Labs  Lab 08/11/18 0607 08/12/18 0357  AST 14* 11*  ALT 13 11  ALKPHOS 54 50  BILITOT 0.9 0.9  PROT 7.3 6.7  ALBUMIN 3.4* 3.0*   No results for input(s): LIPASE, AMYLASE in the last 168 hours. No results for input(s): AMMONIA in the last 168 hours. Coagulation Profile: No results for input(s): INR, PROTIME in the last 168 hours. Cardiac Enzymes: Recent Labs  Lab 08/08/18 1729 08/08/18 2052 08/09/18 0237 08/11/18 0607 08/11/18 1152  TROPONINI <0.03 <0.03 <0.03 <0.03 <0.03   BNP (last 3 results) No results for input(s): PROBNP in the last 8760 hours. HbA1C: No results for input(s): HGBA1C in the last 72 hours. CBG: Recent Labs  Lab 08/13/18 1125 08/13/18 1558 08/13/18 2120 08/14/18 0755 08/14/18 1109  GLUCAP 89 119* 115* 94 76   Lipid Profile: No results for input(s): CHOL, HDL, LDLCALC, TRIG, CHOLHDL, LDLDIRECT in the last 72 hours. Thyroid Function Tests: No results for input(s): TSH, T4TOTAL, FREET4, T3FREE, THYROIDAB in the last 72 hours. Anemia Panel: No results  for input(s): VITAMINB12, FOLATE, FERRITIN, TIBC, IRON, RETICCTPCT in the last 72 hours. Urine analysis:    Component Value Date/Time   COLORURINE YELLOW 04/06/2015 1935   APPEARANCEUR CLEAR 04/06/2015 1935   LABSPEC 1.015 04/06/2015 1935   PHURINE 5.5 04/06/2015 1935   GLUCOSEU NEGATIVE 04/06/2015 1935   GLUCOSEU NEG mg/dL 11/21/2010 1308   HGBUR TRACE (A) 04/06/2015 1935   BILIRUBINUR NEGATIVE 04/06/2015 1935   KETONESUR NEGATIVE 04/06/2015 1935   PROTEINUR NEGATIVE 04/06/2015 1935   UROBILINOGEN 0.2 04/06/2015 1935   NITRITE NEGATIVE 04/06/2015 1935   LEUKOCYTESUR NEGATIVE 04/06/2015 1935   Sepsis Labs: Invalid input(s): PROCALCITONIN, LACTICIDVEN  No results found for this or any previous visit (from the past 240 hour(s)).    Radiology Studies: Dg Chest 2 View  Result Date: 08/13/2018 CLINICAL DATA:  Chest pain and dyspnea. History of CHF, asthma, breast malignancy, nonsmoker. EXAM: CHEST - 2 VIEW COMPARISON:  Portable chest x-ray of August 11, 2018 FINDINGS: The lungs are better inflated today. The patient is positioned better as well. The interstitial markings remain increased greatest in the left mid and lower lung. There is no  pleural effusion and no pneumothorax. The heart is normal in size. The central pulmonary vascularity is prominent. There is calcification in the wall of the aortic arch. IMPRESSION: Improved appearance of both lungs with improved aeration. There is persistent increased interstitial density greatest in the left mid and lower lung which may reflect asymmetric pulmonary edema or interstitial pneumonia. Thoracic aortic atherosclerosis. Electronically Signed   By: David  Martinique M.D.   On: 08/13/2018 10:30     Scheduled Meds: . aspirin EC  81 mg Oral Daily  . atorvastatin  40 mg Oral QHS  . carvedilol  3.125 mg Oral BID WC  . dicyclomine  10 mg Oral TID AC  . doxazosin  4 mg Oral Daily  . enoxaparin (LOVENOX) injection  50 mg Subcutaneous Daily  .  exenatide  10 mcg Subcutaneous BID WC  . furosemide  60 mg Intravenous BID  . insulin aspart  0-9 Units Subcutaneous TID WC  . insulin glargine  26 Units Subcutaneous q morning - 10a  . ipratropium-albuterol  3 mL Nebulization BID  . magnesium oxide  400 mg Oral Daily  . mometasone-formoterol  2 puff Inhalation BID  . montelukast  10 mg Oral QHS  . pantoprazole  40 mg Oral BID AC  . sertraline  50 mg Oral Daily  . vitamin C  500 mg Oral Daily   Continuous Infusions:   Marzetta Board, MD, PhD [facility] Pager [phone]  If 7PM-7AM, please contact night-coverage [email or web address] Password [facility] 08/14/2018, 2:18 PM

## 2018-08-14 NOTE — Plan of Care (Signed)
  Problem: Education: Goal: Knowledge of General Education information will improve Description Including pain rating scale, medication(s)/side effects and non-pharmacologic comfort measures Outcome: Completed/Met   Problem: Activity: Goal: Risk for activity intolerance will decrease Outcome: Completed/Met   Problem: Nutrition: Goal: Adequate nutrition will be maintained Outcome: Completed/Met   Problem: Coping: Goal: Level of anxiety will decrease Outcome: Completed/Met

## 2018-08-15 DIAGNOSIS — I5033 Acute on chronic diastolic (congestive) heart failure: Secondary | ICD-10-CM

## 2018-08-15 LAB — GLUCOSE, CAPILLARY
GLUCOSE-CAPILLARY: 99 mg/dL (ref 70–99)
Glucose-Capillary: 101 mg/dL — ABNORMAL HIGH (ref 70–99)

## 2018-08-15 MED ORDER — FUROSEMIDE 20 MG PO TABS: 40.0000 mg | ORAL_TABLET | Freq: Every day | ORAL | 0 refills | Status: DC

## 2018-08-15 MED ORDER — AZITHROMYCIN 500 MG PO TABS: 500.0000 mg | ORAL_TABLET | Freq: Every day | ORAL | 0 refills | Status: AC

## 2018-08-15 NOTE — Care Management Note (Signed)
Case Management Note  Patient Details  Name: Ann Fuller MRN: [id number] Date of Birth: March 14, 1946  Subjective/Objective:    Acute Resp Failure             Action/Plan: Patient lives at home with spouse; PCP: Asencion Noble, MD; has private insurance with [facility] with prescription drug coverage; pharmacy of choice is [facility]; home oxygen ordered as requested and a portable tank is to be delivered to the home today prior to discharging home; also [facility] choices offered, pt chose [place]; Dan with [facility] called for arrangements; CM will continue to follow for progression of care.  Expected Discharge Date:  08/15/18               Expected Discharge Plan:  [place]  Discharge planning Services  CM Consult  Choice offered to:  Patient  DME Arranged:  Oxygen DME Agency:  [place] Arranged:  PT, OT [facility] Agency:  [place]  Status of Service:  In process, will continue to follow  Sherrilyn Rist [phone] 08/15/2018, 11:45 AM

## 2018-08-15 NOTE — Discharge Summary (Signed)
Physician Discharge Summary  Ann Fuller LXB:[id number] DOB: 05-06-1946 DOA: 08/11/2018  PCP: Ann Noble, MD  Admit date: 08/11/2018 Discharge date: 08/15/2018  Admitted From: home Disposition:  home  Recommendations for Outpatient Follow-up:  1. Follow up with PCP in 1-2 weeks  Home Health: none Equipment/Devices: none  Discharge Condition: stable CODE STATUS: Full code Diet recommendation: heart healthy  HPI: Per Dr. Glyn Ade, Ann Fuller is a 72 y.o. female with history of CHF, asthma, diabetes mellitus type 2, morbid obesity, anemia, moderate aortic stenosis per the chart who was recently admitted for chest pain and was discharged home with patient planning to follow-up with her cardiologist at [facility] presents with worsening shortness of breath since discharge.  Patient states he has been having shortness of breath which has been progressively worsening last few days.  Her chest pain is resolved.  Chest pain happened during last admission to 3 days ago which was radiating to her jaw.  Denies any productive cough fever or chills. ED Course: In the ER patient was short of breath and hypoxic and had to be placed on BiPAP chest x-ray shows congestion.  BNP was around 110.  EKG shows sinus tachycardia patient was given Lasix and admitted for acute CHF.  Patient has bilateral lower extremity edema.  JVD elevated.  Hospital Course: Acute hypoxic respiratory failure due to acute on chronic diastolic CHF / asthma -Secondary to diastolic HF exacerbation, and component of asthma. Patient was diuresed with IV lasix and is net negative 8L, weight improved from 244 to 234, her edema resolved. She is euvolemic, was transitioned to an increased home diuretic regimen (Lasix changed from 20 to 40) and will be discharged home in stable condition.  Acute Diastolic Heart failure exacerbation Moderate aortic stenosis -followed at [facility]  Severe persistent asthma, eosinophilia -followed by  Pulmonology at [facility], with multiple visits relating to dyspnea on exertion. After achieving euvolemic status patient did require 2L O2 to maintain sats > 90% with ambulation while feeling at baseline. Advised to have close follow up with Pulmonary post discharge. No wheezing on d/c however has been having a cough and she was placed on Z pack.  Anxiety -continue Zoloft   Hyperlipidemia -continue statins.  Discharge Diagnoses:  Principal Problem:   Acute respiratory failure with hypoxia (HCC) Active Problems:   Type 2 diabetes mellitus (HCC)   Essential hypertension, benign   Syncope   Normocytic anemia   CHF (congestive heart failure) ([place])     Discharge Instructions   Allergies as of 08/15/2018      Reactions   Codeine Swelling   Tongue swells   Ceftin [cefuroxime Axetil] Swelling   Metformin And Related Diarrhea   Nexium [esomeprazole Magnesium] Diarrhea   Omnicef [cefdinir] Diarrhea   Sulfa Antibiotics Rash      Medication List    TAKE these medications   acetaminophen 650 MG CR tablet Commonly known as:  TYLENOL Take 650 mg by mouth every 8 (eight) hours as needed for pain.   AEROCHAMBER MV inhaler Use as instructed   albuterol 108 (90 Base) MCG/ACT inhaler Commonly known as:  PROVENTIL HFA;VENTOLIN HFA Inhale 2 puffs into the lungs every 4 (four) hours as needed for wheezing or shortness of breath.   amLODipine 5 MG tablet Commonly known as:  NORVASC Take 5 mg by mouth daily.   aspirin EC 81 MG tablet Take 81 mg by mouth daily.   azithromycin 500 MG tablet Commonly known as:  CBULAGTXM  Take 1 tablet (500 mg total) by mouth daily for 3 days. Take 1 tablet daily for 3 days.   baclofen 10 MG tablet Commonly known as:  LIORESAL Take 5 mg by mouth 3 (three) times daily as needed for muscle spasms.   BENICAR 40 MG tablet Generic drug:  olmesartan Take 40 mg by mouth daily.   budesonide-formoterol 80-4.5 MCG/ACT inhaler Commonly known as:   SYMBICORT Inhale 2 puffs into the lungs 2 (two) times daily.   BYETTA 10 MCG PEN 10 MCG/0.04ML Sopn injection Generic drug:  exenatide Inject 10 mcg into the skin 2 (two) times daily with a meal.   carvedilol 3.125 MG tablet Commonly known as:  COREG Take 1 tablet (3.125 mg total) by mouth 2 (two) times daily with a meal.   dicyclomine 10 MG capsule Commonly known as:  BENTYL TAKE 1 CAPSULE BY MOUTH PRIOR TO MEALS AS NEEDED .Marland KitchenMarland KitchenNO MORE THAN 4 TIMES DAILY MAY CAUSE DROWSINESS What changed:  See the new instructions.   doxazosin 4 MG tablet Commonly known as:  CARDURA Take 4 mg by mouth daily.   EPIPEN 2-PAK 0.3 mg/0.3 mL [place] injection Generic drug:  EPINEPHrine Inject 0.3 mg into the muscle daily as needed (allergic reaction).   FASENRA 30 MG/ML Sosy Generic drug:  Benralizumab Inject 30 mg into the skin every 8 (eight) weeks.   fluticasone 50 MCG/ACT nasal spray Commonly known as:  FLONASE Place 1 spray into both nostrils daily as needed for allergies or rhinitis.   furosemide 20 MG tablet Commonly known as:  LASIX Take 2 tablets (40 mg total) by mouth daily. What changed:  how much to take   HUMALOG KWIKPEN [place] Inject 6 Units into the skin 2 (two) times daily before a meal.   Insulin Glargine 100 UNIT/ML Solostar Pen Commonly known as:  LANTUS Inject 26 Units into the skin every morning.   ipratropium-albuterol 0.5-2.5 (3) MG/3ML Soln Commonly known as:  DUONEB Take 3 mLs by nebulization every 4 (four) hours. What changed:    when to take this  reasons to take this   LIPITOR 40 MG tablet Generic drug:  atorvastatin Take 40 mg by mouth at bedtime.   LORazepam 1 MG tablet Commonly known as:  ATIVAN Take 1 mg by mouth 2 (two) times daily as needed for anxiety or sleep.   magnesium oxide 400 MG tablet Commonly known as:  MAG-OX Take 400 mg by mouth daily.   mometasone-formoterol 100-5 MCG/ACT Aero Commonly known as:  DULERA Inhale 2 puffs into the lungs  2 (two) times daily.   montelukast 10 MG tablet Commonly known as:  SINGULAIR TAKE ONE TABLET BY MOUTH EVERY NIGHT AT BEDTIME   pantoprazole 40 MG tablet Commonly known as:  PROTONIX TAKE ONE TABLET TWICE DAILY BEFORE A MEAL What changed:  See the new instructions.   sertraline 50 MG tablet Commonly known as:  ZOLOFT Take 50 mg by mouth daily.   temazepam 15 MG capsule Commonly known as:  RESTORIL Take 15 mg by mouth at bedtime as needed for sleep.   vitamin C 500 MG tablet Commonly known as:  ASCORBIC ACID Take 500 mg by mouth daily.            Durable Medical Equipment  (From admission, onward)         Start     Ordered   08/15/18 1053  For home use only DME oxygen  Once    Question Answer Comment  Mode or (  Route) Nasal cannula   Liters per Minute 2   Frequency Continuous (stationary and portable oxygen unit needed)   Oxygen delivery system Gas      08/15/18 1052         Follow-up Information    Ann Noble, MD. Go on 08/29/2018.   Specialty:  Internal Medicine Why:  @3pm  Contact information: [address] [place] [address] [facility].   Why:  They will do your home health care at your home Contact information: [address] [place] [address] [phone]           Consultations:  none  Procedures/Studies:  2D echo  Study Conclusions - Left ventricle: Left ventricular diastolic function parameters were normal. Doppler parameters are consistent with elevated ventricular end-diastolic filling pressure. - Aortic valve: Poorly visualized due to breast implants. Cannot tell if tri leaflet Calcified. CW doppler suggest mild Aortic Stenosis LVOT is narrow There was mild regurgitation. - Mitral valve: Severely calcified annulus. - Atrial septum: No defect or patent foramen ovale was identified. - Pulmonary arteries: PA peak pressure: 33 mm Hg (S).  Dg Chest 2  View  Result Date: 08/13/2018 CLINICAL DATA:  Chest pain and dyspnea. History of CHF, asthma, breast malignancy, nonsmoker. EXAM: CHEST - 2 VIEW COMPARISON:  Portable chest x-ray of August 11, 2018 FINDINGS: The lungs are better inflated today. The patient is positioned better as well. The interstitial markings remain increased greatest in the left mid and lower lung. There is no pleural effusion and no pneumothorax. The heart is normal in size. The central pulmonary vascularity is prominent. There is calcification in the wall of the aortic arch. IMPRESSION: Improved appearance of both lungs with improved aeration. There is persistent increased interstitial density greatest in the left mid and lower lung which may reflect asymmetric pulmonary edema or interstitial pneumonia. Thoracic aortic atherosclerosis. Electronically Signed   By: David  Martinique M.D.   On: 08/13/2018 10:30   Dg Chest 2 View  Result Date: 08/08/2018 CLINICAL DATA:  Chest pain EXAM: CHEST - 2 VIEW COMPARISON:  04/22/2018, 11/17/2017, CT chest 11/19/2017 FINDINGS: Tiny pleural effusion. Linear atelectasis at the left base. Chronic interstitial opacities. Stable cardiomediastinal silhouette with aortic atherosclerosis. No pneumothorax. Surgical clips in the left axilla. IMPRESSION: No active cardiopulmonary disease. Similar appearance of chronic interstitial opacities. Electronically Signed   By: Donavan Foil M.D.   On: 08/08/2018 18:16   Dg Chest Portable 1 View  Result Date: 08/11/2018 CLINICAL DATA:  Shortness of breath and chest pain tonight. EXAM: PORTABLE CHEST 1 VIEW COMPARISON:  08/08/2018 FINDINGS: Shallow inspiration. Mild cardiac enlargement. Pulmonary vascular congestion. Bilateral perihilar infiltration likely representing edema although multifocal pneumonia could also have this appearance. No blunting of costophrenic angles. No pneumothorax. Degenerative changes in the spine and shoulders. IMPRESSION: Cardiac enlargement with  pulmonary vascular congestion and bilateral perihilar edema. Developing since previous study. Electronically Signed   By: Lucienne Capers M.D.   On: 08/11/2018 00:48      Subjective: - no chest pain, shortness of breath, no abdominal pain, nausea or vomiting. Wants to go home   Discharge Exam: Vitals:   08/15/18 0826 08/15/18 0841  BP: (!) 132/98   Pulse: 76 75  Resp:  20  Temp:    SpO2: 97% 98%    General: Pt is alert, awake, not in acute distress Cardiovascular: RRR, S1/S2 +, no rubs, no gallops Respiratory:  CTA bilaterally, no wheezing, no rhonchi Abdominal: Soft, NT, ND, bowel sounds + Extremities: no edema, no cyanosis    The results of significant diagnostics from this hospitalization (including imaging, microbiology, ancillary and laboratory) are listed below for reference.     Microbiology: No results found for this or any previous visit (from the past 240 hour(s)).   Labs: BNP (last 3 results) Recent Labs    02/18/18 1045 08/08/18 1729 08/11/18 0039  BNP 65.0 78.0 973.5*   Basic Metabolic Panel: Recent Labs  Lab 08/11/18 0039 08/11/18 0607 08/12/18 0357 08/13/18 0426 08/14/18 1031  NA 134* 136 135 132* 134*  K 4.3 4.1 4.1 4.0 3.9  CL 96* 98 98 95* 91*  CO2 27 28 31 31  34*  GLUCOSE 82 102* 87 126* 100*  BUN 16 15 20  25* 21  CREATININE 0.97 0.97 1.32* 1.29* 0.98  CALCIUM 9.3 9.2 8.9 8.6* 9.4  MG  --  1.8 2.1  --   --    Liver Function Tests: Recent Labs  Lab 08/11/18 0607 08/12/18 0357  AST 14* 11*  ALT 13 11  ALKPHOS 54 50  BILITOT 0.9 0.9  PROT 7.3 6.7  ALBUMIN 3.4* 3.0*   No results for input(s): LIPASE, AMYLASE in the last 168 hours. No results for input(s): AMMONIA in the last 168 hours. CBC: Recent Labs  Lab 08/08/18 1729 08/11/18 0039 08/11/18 0607  WBC 7.4 11.5* 11.8*  NEUTROABS  --   --  9.4*  HGB 8.8* 9.0* 8.8*  HCT 27.7* 29.3* 27.9*  MCV 89.4 91.6 90.6  PLT 249 245 272   Cardiac Enzymes: Recent Labs  Lab  08/08/18 1729 08/08/18 2052 08/09/18 0237 08/11/18 0607 08/11/18 1152  TROPONINI <0.03 <0.03 <0.03 <0.03 <0.03   BNP: Invalid input(s): POCBNP CBG: Recent Labs  Lab 08/14/18 1109 08/14/18 1653 08/14/18 2116 08/15/18 0816 08/15/18 1220  GLUCAP 76 96 121* 99 101*   D-Dimer No results for input(s): DDIMER in the last 72 hours. Hgb A1c No results for input(s): HGBA1C in the last 72 hours. Lipid Profile No results for input(s): CHOL, HDL, LDLCALC, TRIG, CHOLHDL, LDLDIRECT in the last 72 hours. Thyroid function studies No results for input(s): TSH, T4TOTAL, T3FREE, THYROIDAB in the last 72 hours.  Invalid input(s): FREET3 Anemia work up No results for input(s): VITAMINB12, FOLATE, FERRITIN, TIBC, IRON, RETICCTPCT in the last 72 hours. Urinalysis    Component Value Date/Time   COLORURINE YELLOW 04/06/2015 1935   APPEARANCEUR CLEAR 04/06/2015 1935   LABSPEC 1.015 04/06/2015 1935   PHURINE 5.5 04/06/2015 1935   GLUCOSEU NEGATIVE 04/06/2015 1935   GLUCOSEU NEG mg/dL 11/21/2010 1308   HGBUR TRACE (A) 04/06/2015 1935   BILIRUBINUR NEGATIVE 04/06/2015 1935   KETONESUR NEGATIVE 04/06/2015 1935   PROTEINUR NEGATIVE 04/06/2015 1935   UROBILINOGEN 0.2 04/06/2015 1935   NITRITE NEGATIVE 04/06/2015 1935   LEUKOCYTESUR NEGATIVE 04/06/2015 1935   Sepsis Labs Invalid input(s): PROCALCITONIN,  WBC,  LACTICIDVEN   Time coordinating discharge: 35 minutes  SIGNED:  Marzetta Board, MD  [facility] 08/15/2018, 3:03 PM Pager [phone]  If 7PM-7AM, please contact night-coverage [email or web address] Password [email or web address]

## 2018-08-15 NOTE — Care Management Important Message (Signed)
Important Message  Patient Details  Name: Ann Fuller MRN: [id number] Date of Birth: 12/12/1946   Medicare Important Message Given:  Yes    [name] [name] 08/15/2018, 2:02 PM

## 2018-08-15 NOTE — Progress Notes (Signed)
SATURATION QUALIFICATIONS: (This note is used to comply with regulatory documentation for home oxygen)  Patient Saturations on Room Air at Rest = 90%  Patient Saturations on Room Air while Ambulating = 81%  Patient Saturations on 2 Liters of oxygen while Ambulating = 94%  Please briefly explain why patient needs home oxygen: 

## 2018-08-15 NOTE — Progress Notes (Signed)
Physical Therapy Treatment Patient Details Name: Ann Fuller MRN: [id number] DOB: February 21, 1946 Today's Date: 08/15/2018    History of Present Illness 72 y.o. female with history of CHF, asthma, diabetes mellitus type 2, morbid obesity, anemia, and moderate aortic stenosis. Pt  recently admitted for chest pain and was discharged home with patient planning to follow-up with her cardiologist at [facility] and now presents with worsening SOB since discharge.    PT Comments    Patient progressing with therapy today, increasing ambulation distance on 3L. practiced using rollator and promoting safe transfers, discussed with family safety considerations and use of pulse ox to monitor home O2. No questions or concerns, planning on d/cing later today.     Follow Up Recommendations  Home health PT;Supervision for mobility/OOB     Equipment Recommendations  Other (comment)(rollator)    Recommendations for Other Services       Precautions / Restrictions Precautions Precautions: Other (comment) Precaution Comments: watch O2 Restrictions Weight Bearing Restrictions: No    Mobility  Bed Mobility Overal bed mobility: Needs Assistance Bed Mobility: Supine to Sit;Sit to Supine     Supine to sit: Supervision Sit to supine: Supervision   General bed mobility comments: Pt sitting on EOB upon my arrival  Transfers Overall transfer level: Needs assistance Equipment used: 4-wheeled walker Transfers: Sit to/from Stand Sit to Stand: Supervision            Ambulation/Gait Ambulation/Gait assistance: Supervision Gait Distance (Feet): 200 Feet Assistive device: None Gait Pattern/deviations: Step-through pattern;Decreased stride length Gait velocity: decreased   General Gait Details: Patient able to improve distance today, practied getting on and off rolator, satted well on 3L   Stairs             Wheelchair Mobility    Modified Rankin (Stroke Patients Only)        Balance Overall balance assessment: Mild deficits observed, not formally tested                                          Cognition Arousal/Alertness: Awake/alert Behavior During Therapy: WFL for tasks assessed/performed Overall Cognitive Status: Within Functional Limits for tasks assessed                                        Exercises      General Comments        Pertinent Vitals/Pain Pain Assessment: No/denies pain Faces Pain Scale: No hurt Pain Intervention(s): Limited activity within patient's tolerance;Monitored during session;Premedicated before session    Home Living                      Prior Function            PT Goals (current goals can now be found in the care plan section) Acute Rehab PT Goals Patient Stated Goal: "Feel stronger" PT Goal Formulation: With patient Time For Goal Achievement: 08/26/18 Potential to Achieve Goals: Good Progress towards PT goals: Progressing toward goals    Frequency    Min 3X/week      PT Plan Current plan remains appropriate    Co-evaluation              AM-PAC PT "6 Clicks" Daily Activity  Outcome Measure  Difficulty turning over in bed (  including adjusting bedclothes, sheets and blankets)?: None Difficulty moving from lying on back to sitting on the side of the bed? : A Little Difficulty sitting down on and standing up from a chair with arms (e.g., wheelchair, bedside commode, etc,.)?: A Little Help needed moving to and from a bed to chair (including a wheelchair)?: A Little Help needed walking in hospital room?: A Little Help needed climbing 3-5 steps with a railing? : A Lot 6 Click Score: 18    End of Session Equipment Utilized During Treatment: Gait belt;Oxygen Activity Tolerance: Other (comment);Patient limited by fatigue Patient left: in bed;with call bell/phone within reach;Other (comment) Nurse Communication: Mobility status PT Visit Diagnosis: Other  abnormalities of gait and mobility (R26.89)     Time: 1140-1210 PT Time Calculation (min) (ACUTE ONLY): 30 min  Charges:  $Gait Training: 8-22 mins $Therapeutic Activity: 8-22 mins                    Reinaldo Berber, PT, DPT Acute Rehab Services Pager: [phone]     Reinaldo Berber 08/15/2018, 1:20 PM

## 2018-08-21 ENCOUNTER — Ambulatory Visit (HOSPITAL_COMMUNITY): Admit: 2018-08-21 | Payer: Medicare Other | Admitting: Internal Medicine

## 2018-08-21 ENCOUNTER — Encounter (HOSPITAL_COMMUNITY): Payer: Self-pay

## 2018-08-21 ENCOUNTER — Other Ambulatory Visit: Payer: Self-pay | Admitting: Gastroenterology

## 2018-08-21 SURGERY — ESOPHAGOGASTRODUODENOSCOPY (EGD) WITH PROPOFOL
Anesthesia: Monitor Anesthesia Care

## 2018-09-26 ENCOUNTER — Inpatient Hospital Stay (HOSPITAL_COMMUNITY): Payer: Medicare Other

## 2018-09-26 ENCOUNTER — Other Ambulatory Visit: Payer: Self-pay

## 2018-09-26 ENCOUNTER — Inpatient Hospital Stay (HOSPITAL_COMMUNITY): Payer: Medicare Other | Attending: Hematology | Admitting: Hematology

## 2018-09-26 ENCOUNTER — Encounter (HOSPITAL_COMMUNITY): Payer: Self-pay | Admitting: Hematology

## 2018-09-26 VITALS — BP 144/75 | HR 67 | Temp 98.3°F | Resp 18

## 2018-09-26 DIAGNOSIS — Z923 Personal history of irradiation: Secondary | ICD-10-CM | POA: Diagnosis not present

## 2018-09-26 DIAGNOSIS — Z9012 Acquired absence of left breast and nipple: Secondary | ICD-10-CM | POA: Insufficient documentation

## 2018-09-26 DIAGNOSIS — Z9882 Breast implant status: Secondary | ICD-10-CM | POA: Diagnosis not present

## 2018-09-26 DIAGNOSIS — Z853 Personal history of malignant neoplasm of breast: Secondary | ICD-10-CM | POA: Diagnosis not present

## 2018-09-26 DIAGNOSIS — R600 Localized edema: Secondary | ICD-10-CM

## 2018-09-26 DIAGNOSIS — D649 Anemia, unspecified: Secondary | ICD-10-CM

## 2018-09-26 DIAGNOSIS — Z9221 Personal history of antineoplastic chemotherapy: Secondary | ICD-10-CM | POA: Insufficient documentation

## 2018-09-26 DIAGNOSIS — Z9981 Dependence on supplemental oxygen: Secondary | ICD-10-CM | POA: Diagnosis not present

## 2018-09-26 DIAGNOSIS — D509 Iron deficiency anemia, unspecified: Secondary | ICD-10-CM | POA: Diagnosis not present

## 2018-09-26 DIAGNOSIS — E538 Deficiency of other specified B group vitamins: Secondary | ICD-10-CM | POA: Insufficient documentation

## 2018-09-26 LAB — LACTATE DEHYDROGENASE: LDH: 157 U/L (ref 98–192)

## 2018-09-26 LAB — COMPREHENSIVE METABOLIC PANEL
ALK PHOS: 61 U/L (ref 38–126)
ALT: 15 U/L (ref 0–44)
AST: 18 U/L (ref 15–41)
Albumin: 4 g/dL (ref 3.5–5.0)
Anion gap: 10 (ref 5–15)
BUN: 32 mg/dL — ABNORMAL HIGH (ref 8–23)
CALCIUM: 9.1 mg/dL (ref 8.9–10.3)
CO2: 30 mmol/L (ref 22–32)
CREATININE: 1.28 mg/dL — AB (ref 0.44–1.00)
Chloride: 95 mmol/L — ABNORMAL LOW (ref 98–111)
GFR calc Af Amer: 48 mL/min — ABNORMAL LOW (ref >60)
GFR calc non Af Amer: 41 mL/min — ABNORMAL LOW (ref >60)
GLUCOSE: 102 mg/dL — AB (ref 70–99)
Potassium: 3.8 mmol/L (ref 3.5–5.1)
SODIUM: 135 mmol/L (ref 135–145)
Total Bilirubin: 0.7 mg/dL (ref 0.3–1.2)
Total Protein: 8 g/dL (ref 6.5–8.1)

## 2018-09-26 LAB — CBC WITH DIFFERENTIAL/PLATELET
BASOS ABS: 0 10*3/uL (ref 0.0–0.1)
BASOS PCT: 0 %
Eosinophils Absolute: 0 10*3/uL (ref 0.0–0.7)
Eosinophils Relative: 0 %
HEMATOCRIT: 29.8 % — AB (ref 36.0–46.0)
Hemoglobin: 9.4 g/dL — ABNORMAL LOW (ref 12.0–15.0)
Lymphocytes Relative: 15 %
Lymphs Abs: 1.2 10*3/uL (ref 0.7–4.0)
MCH: 28.4 pg (ref 26.0–34.0)
MCHC: 31.5 g/dL (ref 30.0–36.0)
MCV: 90 fL (ref 78.0–100.0)
MONOS PCT: 10 %
Monocytes Absolute: 0.8 10*3/uL (ref 0.1–1.0)
NEUTROS ABS: 6 10*3/uL (ref 1.7–7.7)
Neutrophils Relative %: 75 %
Platelets: 249 10*3/uL (ref 150–400)
RBC: 3.31 MIL/uL — AB (ref 3.87–5.11)
RDW: 14.7 % (ref 11.5–15.5)
WBC: 8 10*3/uL (ref 4.0–10.5)

## 2018-09-26 LAB — DIRECT ANTIGLOBULIN TEST (NOT AT ARMC)
DAT, COMPLEMENT: NEGATIVE
DAT, IgG: NEGATIVE

## 2018-09-26 LAB — RETICULOCYTES
RBC.: 3.31 MIL/uL — ABNORMAL LOW (ref 3.87–5.11)
RETIC CT PCT: 0.8 % (ref 0.4–3.1)
Retic Count, Absolute: 26.5 10*3/uL (ref 19.0–186.0)

## 2018-09-26 NOTE — Progress Notes (Signed)
CONSULT NOTE  Patient Care Team: Asencion Noble, MD as PCP - General (Internal Medicine) Gala [place] Cristopher Estimable, MD (Gastroenterology) Elsie Stain, MD as Consulting Physician (Pulmonary Disease)  CHIEF COMPLAINTS/PURPOSE OF CONSULTATION:  Normocytic anemia.  HISTORY OF PRESENTING ILLNESS:  Ann Fuller 72 y.o. female is seen in consultation today for further work-up and management of normocytic anemia.  The most recent CBC on 09/03/2018 done at [facility] showed a hemoglobin of 8.3.  MCV was 87.  Dr. Willey Blade repeated the labs on 09/11/2018 when hemoglobin improved to 9.3.  Serum ferritin was 92, percent saturation was 59 TIBC was 336.  Vitamin Y85 was 027 and folic acid 74.1.  MCV was 86.  She denies any bleeding per rectum or melena.  She was recently admitted from 08/11/2018 through 08/15/2018 with CHF exacerbation.  She denies any fevers, night sweats or weight loss.  She reports having taken iron pill for a brief.  In February 2019 when she was in [place].  Her last colonoscopy was in November 2016.  A CT scan of the abdomen and pelvis on 01/24/2017 shows normal spleen size.  She denies any presyncopal episodes or palpitations.  She does have shortness of breath on exertion.  She was placed on oxygen on exertion since recent hospitalization. She denies any pica or NSAID ingestion. Family history significant for mother with stomach cancer, father with melanoma.  Sister who had a sickle cell trait also had lung cancer.  MEDICAL HISTORY:  Past Medical History:  Diagnosis Date  . Adenomatous colon polyp   . Arthritis   . Asthma   . Breast cancer ([place]) [id number]  . Bulging lumbar disc   . CHF (congestive heart failure) ([place])   . Depression   . Diverticulitis   . Dyspepsia   . Essential hypertension, benign   . GERD (gastroesophageal reflux disease)   . Heart murmur   . Hyperlipidemia   . IBS (irritable bowel syndrome)   . Internal hemorrhoid   . Right shoulder pain   .  Tubular adenoma 11/2010  . Type 2 diabetes mellitus ([place])     SURGICAL HISTORY: Past Surgical History:  Procedure Laterality Date  . ABDOMINAL HYSTERECTOMY    . APPENDECTOMY    . BACK SURGERY  2007   Lumbar fusion  . CATARACT EXTRACTION W/PHACO Left 08/27/2013   Procedure: CATARACT EXTRACTION PHACO AND INTRAOCULAR LENS PLACEMENT (IOC);  Surgeon: Tonny Branch, MD;  Location: [facility];  Service: Ophthalmology;  Laterality: Left;  CDE 4.25  . CATARACT EXTRACTION W/PHACO Right 09/24/2013   Procedure: CATARACT EXTRACTION PHACO AND INTRAOCULAR LENS PLACEMENT (IOC);  Surgeon: Tonny Branch, MD;  Location: [facility];  Service: Ophthalmology;  Laterality: Right;  CDE:  8.16  . CHOLECYSTECTOMY    . COLONOSCOPY  12/13/2010   Dr. Margarito Courser pancolonic diverticular.tubular adenoma  . COLONOSCOPY  07/13/2003  . COLONOSCOPY N/A 10/28/2015   [facility] diverticulosis, multiple tubular adenomas removed. next tcs 10/2020.   . ESOPHAGOGASTRODUODENOSCOPY  12/13/2010   Dr. Jennet Maduro hernia, fundal gland type polyps  . EYE SURGERY Left    Left KPE 08/27/13  . MASTECTOMY Left 1995  . NASAL ENDOSCOPY WITH EPISTAXIS CONTROL Left 07/16/2016   Procedure: ENDOSCOPIC LEFT NASAL CAUTERY;  Surgeon: Leta Baptist, MD;  Location: [facility];  Service: ENT;  Laterality: Left;  . TONSILLECTOMY    . TUBAL LIGATION      SOCIAL HISTORY: Social History   Socioeconomic History  . Marital status: Married  Spouse name: Not on file  . Number of children: Not on file  . Years of education: Not on file  . Highest education level: Not on file  Occupational History  . Not on file  Social Needs  . Financial resource strain: Not on file  . Food insecurity:    Worry: Not on file    Inability: Not on file  . Transportation needs:    Medical: Not on file    Non-medical: Not on file  Tobacco Use  . Smoking status: Never Smoker  . Smokeless tobacco: Never Used  Substance and Sexual Activity  . Alcohol use: No  .  Drug use: No  . Sexual activity: Yes    Birth control/protection: Surgical  Lifestyle  . Physical activity:    Days per week: Not on file    Minutes per session: Not on file  . Stress: Not on file  Relationships  . Social connections:    Talks on phone: Not on file    Gets together: Not on file    Attends religious service: Not on file    Active member of club or organization: Not on file    Attends meetings of clubs or organizations: Not on file    Relationship status: Not on file  . Intimate partner violence:    Fear of current or ex partner: Not on file    Emotionally abused: Not on file    Physically abused: Not on file    Forced sexual activity: Not on file  Other Topics Concern  . Not on file  Social History Narrative  . Not on file    FAMILY HISTORY: Family History  Problem Relation Age of Onset  . Colon cancer Mother   . Heart attack Father   . Heart attack Sister   . Stroke Sister   . Cancer - Lung Sister     ALLERGIES:  is allergic to codeine; ceftin [cefuroxime axetil]; metformin and related; nexium [esomeprazole magnesium]; omnicef [cefdinir]; and sulfa antibiotics.  MEDICATIONS:  Current Outpatient Medications  Medication Sig Dispense Refill  . acetaminophen (TYLENOL) 650 MG CR tablet Take 650 mg by mouth every 8 (eight) hours as needed for pain.    Marland Kitchen albuterol (PROVENTIL HFA;VENTOLIN HFA) 108 (90 Base) MCG/ACT inhaler Inhale 2 puffs into the lungs every 4 (four) hours as needed for wheezing or shortness of breath.    Marland Kitchen amLODipine (NORVASC) 5 MG tablet Take 5 mg by mouth daily.    Marland Kitchen atorvastatin (LIPITOR) 40 MG tablet Take 40 mg by mouth at bedtime.     . baclofen (LIORESAL) 10 MG tablet Take 5 mg by mouth 3 (three) times daily as needed for muscle spasms.     . Benralizumab (FASENRA) 30 MG/ML SOSY Inject 30 mg into the skin every 8 (eight) weeks.     . budesonide-formoterol (SYMBICORT) 80-4.5 MCG/ACT inhaler Inhale 2 puffs into the lungs 2 (two) times  daily. 1 Inhaler 0  . carvedilol (COREG) 3.125 MG tablet Take 1 tablet (3.125 mg total) by mouth 2 (two) times daily with a meal. 60 tablet 1  . dicyclomine (BENTYL) 10 MG capsule TAKE 1 CAPSULE BY MOUTH PRIOR TO MEALS AS NEEDED .Marland KitchenMarland KitchenNO MORE THAN 4 TIMES DAILY MAY CAUSE DROWSINESS (Patient taking differently: Take 10 mg by mouth 3 (three) times daily before meals. ) 120 capsule 3  . doxazosin (CARDURA) 4 MG tablet Take 4 mg by mouth daily.    Marland Kitchen EPINEPHrine (EPIPEN 2-PAK) 0.3 mg/0.3 mL  IJ SOAJ injection Inject 0.3 mg into the muscle daily as needed (allergic reaction).    Marland Kitchen exenatide (BYETTA 10 MCG PEN) 10 MCG/0.04ML SOLN Inject 10 mcg into the skin 2 (two) times daily with a meal.     . fluticasone (FLONASE) 50 MCG/ACT nasal spray Place 1 spray into both nostrils daily as needed for allergies or rhinitis.    . furosemide (LASIX) 40 MG tablet     . Insulin Glargine (LANTUS) 100 UNIT/ML Solostar Pen Inject 26 Units into the skin every morning.     . Insulin Lispro (HUMALOG KWIKPEN Circle) Inject 6 Units into the skin 2 (two) times daily before a meal.     . ipratropium-albuterol (DUONEB) 0.5-2.5 (3) MG/3ML SOLN Take 3 mLs by nebulization every 4 (four) hours. (Patient taking differently: Take 3 mLs by nebulization every 6 (six) hours as needed (wheezing and shortness of breath). ) 360 mL 6  . LORazepam (ATIVAN) 1 MG tablet Take 1 mg by mouth 2 (two) times daily as needed for anxiety or sleep.     . magnesium oxide (MAG-OX) 400 MG tablet Take 400 mg by mouth daily.    . mometasone-formoterol (DULERA) 100-5 MCG/ACT AERO Inhale 2 puffs into the lungs 2 (two) times daily.    . montelukast (SINGULAIR) 10 MG tablet TAKE ONE TABLET BY MOUTH EVERY NIGHT AT BEDTIME (Patient taking differently: Take 10 mg by mouth at bedtime. ) 30 tablet 5  . olmesartan (BENICAR) 40 MG tablet Take 40 mg by mouth daily.     . pantoprazole (PROTONIX) 40 MG tablet TAKE ONE TABLET BY MOUTH TWICE A DAY BEFORE A MEAL 180 tablet 1  .  sertraline (ZOLOFT) 50 MG tablet Take 50 mg by mouth daily.     Marland Kitchen Spacer/Aero-Holding Chambers (AEROCHAMBER MV) inhaler Use as instructed 1 each 0  . SPIRIVA HANDIHALER 18 MCG inhalation capsule     . temazepam (RESTORIL) 15 MG capsule Take 15 mg by mouth at bedtime as needed for sleep.    . vitamin C (ASCORBIC ACID) 500 MG tablet Take 500 mg by mouth daily.      No current facility-administered medications for this visit.     REVIEW OF SYSTEMS:   Constitutional: Denies fevers, chills or abnormal night sweats Eyes: Denies blurriness of vision, double vision or watery eyes Ears, nose, mouth, throat, and face: Denies mucositis or sore throat Respiratory: Denies cough, dyspnea or wheezes Cardiovascular: Denies palpitation, chest discomfort or lower extremity swelling Gastrointestinal:  Denies nausea, heartburn or change in bowel habits.  She has diarrhea alternating with constipation. Skin: Denies abnormal skin rashes Lymphatics: Denies new lymphadenopathy or easy bruising Neurological:Denies numbness, tingling or new weaknesses Behavioral/Psych: Mood is stable, no new changes  All other systems were reviewed with the patient and are negative.  PHYSICAL EXAMINATION: ECOG PERFORMANCE STATUS: 1 - Symptomatic but completely ambulatory  Vitals:   09/26/18 1356  BP: (!) 144/75  Pulse: 67  Resp: 18  Temp: 98.3 F (36.8 C)  SpO2: 100%   There were no vitals filed for this visit.  GENERAL:alert, no distress and comfortable SKIN: skin color, texture, turgor are normal, no rashes or significant lesions EYES: normal, conjunctiva are pink and non-injected, sclera clear OROPHARYNX:no exudate, no erythema and lips, buccal mucosa, and tongue normal  NECK: supple, thyroid normal size, non-tender, without nodularity LYMPH:  no palpable lymphadenopathy in the cervical, axillary or inguinal LUNGS: clear to auscultation and percussion with normal breathing effort HEART: regular rate & rhythm  and  no murmurs.  She has trace edema. ABDOMEN:abdomen soft, non-tender and normal bowel sounds Musculoskeletal:no cyanosis of digits and no clubbing  PSYCH: alert & oriented x 3 with fluent speech NEURO: no focal motor/sensory deficits  LABORATORY DATA:  I have reviewed the data as listed Recent Results (from the past 2160 hour(s))  Basic metabolic panel     Status: Abnormal   Collection Time: 08/08/18  5:29 PM  Result Value Ref Range   Sodium 131 (L) 135 - 145 mmol/L   Potassium 3.8 3.5 - 5.1 mmol/L   Chloride 95 (L) 98 - 111 mmol/L   CO2 28 22 - 32 mmol/L   Glucose, Bld 126 (H) 70 - 99 mg/dL   BUN 21 8 - 23 mg/dL   Creatinine, Ser 1.07 (H) 0.44 - 1.00 mg/dL   Calcium 8.7 (L) 8.9 - 10.3 mg/dL   GFR calc non Af Amer 51 (L) >60 mL/min   GFR calc Af Amer 60 (L) >60 mL/min    Comment: (NOTE) The eGFR has been calculated using the CKD EPI equation. This calculation has not been validated in all clinical situations. eGFR's persistently <60 mL/min signify possible Chronic Kidney Disease.    Anion gap 8 5 - 15    Comment: Performed at [facility], [address] [place], [place] [address]  CBC     Status: Abnormal   Collection Time: 08/08/18  5:29 PM  Result Value Ref Range   WBC 7.4 4.0 - 10.5 K/uL   RBC 3.10 (L) 3.87 - 5.11 MIL/uL   Hemoglobin 8.8 (L) 12.0 - 15.0 g/dL   HCT 27.7 (L) 36.0 - 46.0 %   MCV 89.4 78.0 - 100.0 fL   MCH 28.4 26.0 - 34.0 pg   MCHC 31.8 30.0 - 36.0 g/dL   RDW 14.9 11.5 - 15.5 %   Platelets 249 150 - 400 K/uL    Comment: Performed at [facility], [address] [place], [place] [address]  Troponin I     Status: None   Collection Time: 08/08/18  5:29 PM  Result Value Ref Range   Troponin I <0.03 <0.03 ng/mL    Comment: Performed at [facility], [address], [place], [place] [address]  Brain natriuretic peptide     Status: None   Collection Time: 08/08/18  5:29 PM  Result Value Ref Range   B Natriuretic Peptide 78.0 0.0 - 100.0 pg/mL    Comment:  Performed at [facility], [address] [place], [place] [address]  Troponin I (q 6hr x 3)     Status: None   Collection Time: 08/08/18  8:52 PM  Result Value Ref Range   Troponin I <0.03 <0.03 ng/mL    Comment: Performed at [facility], [address], [place], [place] [address]  Glucose, capillary     Status: Abnormal   Collection Time: 08/08/18 11:33 PM  Result Value Ref Range   Glucose-Capillary 166 (H) 70 - 99 mg/dL   Comment 1 Notify RN    Comment 2 Document in Chart   Troponin I (q 6hr x 3)     Status: None   Collection Time: 08/09/18  2:37 AM  Result Value Ref Range   Troponin I <0.03 <0.03 ng/mL    Comment: Performed at [facility], [address], [place], [place] [address]  HIV antibody (Routine Testing)     Status: None   Collection Time: 08/09/18  2:37 AM  Result Value Ref Range  HIV Screen 4th Generation wRfx Non Reactive Non Reactive    Comment: (NOTE) Performed At: [facility] [place], [place] [id number] Rush Farmer MD [id number]   Glucose, capillary     Status: Abnormal   Collection Time: 08/09/18  7:33 AM  Result Value Ref Range   Glucose-Capillary 109 (H) 70 - 99 mg/dL   Comment 1 Notify RN    Comment 2 Document in Chart   Basic metabolic panel     Status: Abnormal   Collection Time: 08/11/18 12:39 AM  Result Value Ref Range   Sodium 134 (L) 135 - 145 mmol/L   Potassium 4.3 3.5 - 5.1 mmol/L   Chloride 96 (L) 98 - 111 mmol/L   CO2 27 22 - 32 mmol/L   Glucose, Bld 82 70 - 99 mg/dL   BUN 16 8 - 23 mg/dL   Creatinine, Ser 0.97 0.44 - 1.00 mg/dL   Calcium 9.3 8.9 - 10.3 mg/dL   GFR calc non Af Amer 58 (L) >60 mL/min   GFR calc Af Amer >60 >60 mL/min    Comment: (NOTE) The eGFR has been calculated using the CKD EPI equation. This calculation has not been validated in all clinical situations. eGFR's persistently <60 mL/min signify possible Chronic Kidney Disease.    Anion gap 11 5 - 15    Comment: Performed at [facility] [address], [place], [place] [address]  CBC     Status: Abnormal   Collection Time: 08/11/18 12:39 AM  Result Value Ref Range   WBC 11.5 (H) 4.0 - 10.5 K/uL   RBC 3.20 (L) 3.87 - 5.11 MIL/uL   Hemoglobin 9.0 (L) 12.0 - 15.0 g/dL   HCT 29.3 (L) 36.0 - 46.0 %   MCV 91.6 78.0 - 100.0 fL   MCH 28.1 26.0 - 34.0 pg   MCHC 30.7 30.0 - 36.0 g/dL   RDW 14.5 11.5 - 15.5 %   Platelets 245 150 - 400 K/uL    Comment: Performed at [facility], [place] [address], [place], [place] [address]  Brain natriuretic peptide     Status: Abnormal   Collection Time: 08/11/18 12:39 AM  Result Value Ref Range   B Natriuretic Peptide 110.3 (H) 0.0 - 100.0 pg/mL    Comment: Performed at [facility] [address], [place], [place] [address]  I-stat troponin, ED     Status: None   Collection Time: 08/11/18 12:54 AM  Result Value Ref Range   Troponin i, poc 0.01 0.00 - 0.08 ng/mL   Comment 3            Comment: Due to the release kinetics of cTnI, a negative result within the first hours of the onset of symptoms does not rule out myocardial infarction with certainty. If myocardial infarction is still suspected, repeat the test at appropriate intervals.   Comprehensive metabolic panel     Status: Abnormal   Collection Time: 08/11/18  6:07 AM  Result Value Ref Range   Sodium 136 135 - 145 mmol/L   Potassium 4.1 3.5 - 5.1 mmol/L   Chloride 98 98 - 111 mmol/L   CO2 28 22 - 32 mmol/L   Glucose, Bld 102 (H) 70 - 99 mg/dL   BUN 15 8 - 23 mg/dL   Creatinine, Ser 0.97 0.44 - 1.00 mg/dL   Calcium 9.2 8.9 - 10.3 mg/dL   Total Protein 7.3 6.5 - 8.1 g/dL   Albumin  3.4 (L) 3.5 - 5.0 g/dL   AST 14 (L) 15 - 41 U/L   ALT 13 0 - 44 U/L   Alkaline Phosphatase 54 38 - 126 U/L   Total Bilirubin 0.9 0.3 - 1.2 mg/dL   GFR calc non Af Amer 58 (L) >60 mL/min   GFR calc Af Amer >60 >60 mL/min    Comment: (NOTE) The eGFR has been calculated using the CKD EPI equation. This calculation has not been  validated in all clinical situations. eGFR's persistently <60 mL/min signify possible Chronic Kidney Disease.    Anion gap 10 5 - 15    Comment: Performed at [facility] [address], [place], [place] [address]  Magnesium     Status: None   Collection Time: 08/11/18  6:07 AM  Result Value Ref Range   Magnesium 1.8 1.7 - 2.4 mg/dL    Comment: Performed at [facility] [address], [place], [place] [address]  CBC WITH DIFFERENTIAL     Status: Abnormal   Collection Time: 08/11/18  6:07 AM  Result Value Ref Range   WBC 11.8 (H) 4.0 - 10.5 K/uL   RBC 3.08 (L) 3.87 - 5.11 MIL/uL   Hemoglobin 8.8 (L) 12.0 - 15.0 g/dL   HCT 27.9 (L) 36.0 - 46.0 %   MCV 90.6 78.0 - 100.0 fL   MCH 28.6 26.0 - 34.0 pg   MCHC 31.5 30.0 - 36.0 g/dL   RDW 14.5 11.5 - 15.5 %   Platelets 272 150 - 400 K/uL   Neutrophils Relative % 80 %   Neutro Abs 9.4 (H) 1.7 - 7.7 K/uL   Lymphocytes Relative 7 %   Lymphs Abs 0.9 0.7 - 4.0 K/uL   Monocytes Relative 13 %   Monocytes Absolute 1.5 (H) 0.1 - 1.0 K/uL   Eosinophils Relative 0 %   Eosinophils Absolute 0.0 0.0 - 0.7 K/uL   Basophils Relative 0 %   Basophils Absolute 0.0 0.0 - 0.1 K/uL   Immature Granulocytes 0 %   Abs Immature Granulocytes 0.0 0.0 - 0.1 K/uL    Comment: Performed at [facility], [address] [place], [place] [address]  TSH     Status: None   Collection Time: 08/11/18  6:07 AM  Result Value Ref Range   TSH 2.135 0.350 - 4.500 uIU/mL    Comment: Performed by a 3rd Generation assay with a functional sensitivity of <=0.01 uIU/mL. Performed at [facility], [place] [address], [place], [place] [address]   Troponin I     Status: None   Collection Time: 08/11/18  6:07 AM  Result Value Ref Range   Troponin I <0.03 <0.03 ng/mL    Comment: Performed at [facility] [address], [place], [place] [address]  CBG monitoring, ED     Status: Abnormal   Collection Time: 08/11/18  7:54 AM  Result Value Ref Range    Glucose-Capillary 108 (H) 70 - 99 mg/dL  Troponin I     Status: None   Collection Time: 08/11/18 11:52 AM  Result Value Ref Range   Troponin I <0.03 <0.03 ng/mL    Comment: Performed at [facility], [place] [address] [place], [place] [address]  CBG monitoring, ED     Status: Abnormal   Collection Time: 08/11/18 11:54 AM  Result Value Ref Range   Glucose-Capillary 112 (H) 70 - 99 mg/dL  Glucose, capillary  Status: Abnormal   Collection Time: 08/11/18  4:51 PM  Result Value Ref Range   Glucose-Capillary 109 (H) 70 - 99 mg/dL  Glucose, capillary     Status: Abnormal   Collection Time: 08/11/18  9:34 PM  Result Value Ref Range   Glucose-Capillary 132 (H) 70 - 99 mg/dL  Comprehensive metabolic panel     Status: Abnormal   Collection Time: 08/12/18  3:57 AM  Result Value Ref Range   Sodium 135 135 - 145 mmol/L   Potassium 4.1 3.5 - 5.1 mmol/L   Chloride 98 98 - 111 mmol/L   CO2 31 22 - 32 mmol/L   Glucose, Bld 87 70 - 99 mg/dL   BUN 20 8 - 23 mg/dL   Creatinine, Ser 1.32 (H) 0.44 - 1.00 mg/dL   Calcium 8.9 8.9 - 10.3 mg/dL   Total Protein 6.7 6.5 - 8.1 g/dL   Albumin 3.0 (L) 3.5 - 5.0 g/dL   AST 11 (L) 15 - 41 U/L   ALT 11 0 - 44 U/L   Alkaline Phosphatase 50 38 - 126 U/L   Total Bilirubin 0.9 0.3 - 1.2 mg/dL   GFR calc non Af Amer 40 (L) >60 mL/min   GFR calc Af Amer 46 (L) >60 mL/min    Comment: (NOTE) The eGFR has been calculated using the CKD EPI equation. This calculation has not been validated in all clinical situations. eGFR's persistently <60 mL/min signify possible Chronic Kidney Disease.    Anion gap 6 5 - 15    Comment: Performed at [facility] [address], [place], [place] [address]  Magnesium     Status: None   Collection Time: 08/12/18  3:57 AM  Result Value Ref Range   Magnesium 2.1 1.7 - 2.4 mg/dL    Comment: Performed at [facility] [address] [place], [place] [address]  Glucose, capillary     Status: None   Collection  Time: 08/12/18  7:52 AM  Result Value Ref Range   Glucose-Capillary 84 70 - 99 mg/dL  Glucose, capillary     Status: Abnormal   Collection Time: 08/12/18 11:16 AM  Result Value Ref Range   Glucose-Capillary 149 (H) 70 - 99 mg/dL  ECHOCARDIOGRAM COMPLETE     Status: None   Collection Time: 08/12/18  1:13 PM  Result Value Ref Range   Weight 3,825.6 oz   Height 59 in   BP 118/41 mmHg  Glucose, capillary     Status: Abnormal   Collection Time: 08/12/18  4:35 PM  Result Value Ref Range   Glucose-Capillary 132 (H) 70 - 99 mg/dL  Glucose, capillary     Status: None   Collection Time: 08/12/18  9:17 PM  Result Value Ref Range   Glucose-Capillary 95 70 - 99 mg/dL   Comment 1 Notify RN    Comment 2 Document in Chart   Basic metabolic panel     Status: Abnormal   Collection Time: 08/13/18  4:26 AM  Result Value Ref Range   Sodium 132 (L) 135 - 145 mmol/L   Potassium 4.0 3.5 - 5.1 mmol/L   Chloride 95 (L) 98 - 111 mmol/L   CO2 31 22 - 32 mmol/L   Glucose, Bld 126 (H) 70 - 99 mg/dL   BUN 25 (H) 8 - 23 mg/dL   Creatinine, Ser 1.29 (H) 0.44 - 1.00 mg/dL   Calcium 8.6 (L) 8.9 - 10.3 mg/dL   GFR calc non Af  Amer 41 (L) >60 mL/min   GFR calc Af Amer 47 (L) >60 mL/min    Comment: (NOTE) The eGFR has been calculated using the CKD EPI equation. This calculation has not been validated in all clinical situations. eGFR's persistently <60 mL/min signify possible Chronic Kidney Disease.    Anion gap 6 5 - 15    Comment: Performed at [facility] [address] [place], [place] [address]  Glucose, capillary     Status: Abnormal   Collection Time: 08/13/18  7:57 AM  Result Value Ref Range   Glucose-Capillary 132 (H) 70 - 99 mg/dL  Glucose, capillary     Status: None   Collection Time: 08/13/18 11:25 AM  Result Value Ref Range   Glucose-Capillary 89 70 - 99 mg/dL  Glucose, capillary     Status: Abnormal   Collection Time: 08/13/18  3:58 PM  Result Value Ref Range   Glucose-Capillary  119 (H) 70 - 99 mg/dL  Glucose, capillary     Status: Abnormal   Collection Time: 08/13/18  9:20 PM  Result Value Ref Range   Glucose-Capillary 115 (H) 70 - 99 mg/dL   Comment 1 Notify RN    Comment 2 Document in Chart   Glucose, capillary     Status: None   Collection Time: 08/14/18  7:55 AM  Result Value Ref Range   Glucose-Capillary 94 70 - 99 mg/dL  Basic metabolic panel     Status: Abnormal   Collection Time: 08/14/18 10:31 AM  Result Value Ref Range   Sodium 134 (L) 135 - 145 mmol/L   Potassium 3.9 3.5 - 5.1 mmol/L   Chloride 91 (L) 98 - 111 mmol/L   CO2 34 (H) 22 - 32 mmol/L   Glucose, Bld 100 (H) 70 - 99 mg/dL   BUN 21 8 - 23 mg/dL   Creatinine, Ser 0.98 0.44 - 1.00 mg/dL   Calcium 9.4 8.9 - 10.3 mg/dL   GFR calc non Af Amer 57 (L) >60 mL/min   GFR calc Af Amer >60 >60 mL/min    Comment: (NOTE) The eGFR has been calculated using the CKD EPI equation. This calculation has not been validated in all clinical situations. eGFR's persistently <60 mL/min signify possible Chronic Kidney Disease.    Anion gap 9 5 - 15    Comment: Performed at [facility] [address], [place], [place] [address]  Glucose, capillary     Status: None   Collection Time: 08/14/18 11:09 AM  Result Value Ref Range   Glucose-Capillary 76 70 - 99 mg/dL  Glucose, capillary     Status: None   Collection Time: 08/14/18  4:53 PM  Result Value Ref Range   Glucose-Capillary 96 70 - 99 mg/dL  Glucose, capillary     Status: Abnormal   Collection Time: 08/14/18  9:16 PM  Result Value Ref Range   Glucose-Capillary 121 (H) 70 - 99 mg/dL   Comment 1 Notify RN    Comment 2 Document in Chart   Glucose, capillary     Status: None   Collection Time: 08/15/18  8:16 AM  Result Value Ref Range   Glucose-Capillary 99 70 - 99 mg/dL  Glucose, capillary     Status: Abnormal   Collection Time: 08/15/18 12:20 PM  Result Value Ref Range   Glucose-Capillary 101 (H) 70 - 99 mg/dL    RADIOGRAPHIC  STUDIES: I have reviewed CT scan of the abdomen and pelvis from 2018.  ASSESSMENT &  PLAN:  Normocytic anemia 1.  Normocytic anemia:  - recent CBC on 09/03/2018 showed hemoglobin of 8.3. - CBC repeated by Dr. Willey Blade on 09/11/2018 shows hemoglobin of 9.3, MCV of 86 with normal white count and platelet count. - Serum ferritin was 92, percent saturation was 15 with a TIBC of 336.  Vitamin G88 was 110 and folic acid was 31.5. -Last colonoscopy was on 10/28/2015 which showed diverticulosis and tubular adenoma.  Recent stool for occult blood was also found to be negative by Dr. Willey Blade.  Patient denies any bleeding per rectum or melena. - The differential diagnosis includes mild CKD and relative iron deficiency state. - Today we will repeat a CBC, check reticulocyte count, LDH.  We will rule out hemolysis by checking direct Coombs test and haptoglobin.  We will also check SPEP, serum copper levels.  We will send a methylmalonic acid as her B12 was borderline.  We will see her back in 1 week to discuss the results and further management options.  2.  History of left breast cancer: - Was initially diagnosed with left breast cancer in 1990, status post lumpectomy and lymph node dissection.  She also received chemotherapy followed by radiation followed by 5 years of tamoxifen. - Had a recurrence of breast cancer in the left breast in 1995 and underwent mastectomy, breast implant  followed by chemotherapy.     All questions were answered. The patient knows to call the clinic with any problems, questions or concerns.     Derek Jack, MD 09/26/18 2:44 PM

## 2018-09-26 NOTE — Assessment & Plan Note (Signed)
1.  Normocytic anemia:  - recent CBC on 09/03/2018 showed hemoglobin of 8.3. - CBC repeated by Dr. Willey Blade on 09/11/2018 shows hemoglobin of 9.3, MCV of 86 with normal white count and platelet count. - Serum ferritin was 92, percent saturation was 15 with a TIBC of 336.  Vitamin D17 was 616 and folic acid was 07.3. -Last colonoscopy was on 10/28/2015 which showed diverticulosis and tubular adenoma.  Recent stool for occult blood was also found to be negative by Dr. Willey Blade.  Patient denies any bleeding per rectum or melena. - The differential diagnosis includes mild CKD and relative iron deficiency state. - Today we will repeat a CBC, check reticulocyte count, LDH.  We will rule out hemolysis by checking direct Coombs test and haptoglobin.  We will also check SPEP, serum copper levels.  We will send a methylmalonic acid as her B12 was borderline.  We will see her back in 1 week to discuss the results and further management options.  2.  History of left breast cancer: - Was initially diagnosed with left breast cancer in 1990, status post lumpectomy and lymph node dissection.  She also received chemotherapy followed by radiation followed by 5 years of tamoxifen. - Had a recurrence of breast cancer in the left breast in 1995 and underwent mastectomy, breast implant  followed by chemotherapy.

## 2018-09-26 NOTE — Patient Instructions (Signed)
[facility] Discharge Instructions  Follow up next week   Thank you for choosing  [facility] at [facility] to provide your oncology and hematology care.  To afford each patient quality time with our provider, please arrive at least 15 minutes before your scheduled appointment time.   If you have a lab appointment with the Cancer Center please come in thru the  Main Entrance and check in at the main information desk  You need to re-schedule your appointment should you arrive 10 or more minutes late.  We strive to give you quality time with our providers, and arriving late affects you and other patients whose appointments are after yours.  Also, if you no show three or more times for appointments you may be dismissed from the clinic at the providers discretion.     Again, thank you for choosing [facility].  Our hope is that these requests will decrease the amount of time that you wait before being seen by our physicians.       _____________________________________________________________  Should you have questions after your visit to [facility], please contact our office at [phone] between the hours of 8:00 a.m. and 4:30 p.m.  Voicemails left after 4:00 p.m. will not be returned until the following business day.  For prescription refill requests, have your pharmacy contact our office and allow 72 hours.    Cancer Center Support Programs:   > Cancer Support Group  2nd Tuesday of the month 1pm-2pm, Journey Room    

## 2018-09-27 LAB — COPPER, SERUM: COPPER: 147 ug/dL (ref 72–166)

## 2018-09-27 LAB — HAPTOGLOBIN: Haptoglobin: 185 mg/dL (ref 34–200)

## 2018-09-29 LAB — PROTEIN ELECTROPHORESIS, SERUM
A/G Ratio: 1.1 (ref 0.7–1.7)
ALBUMIN ELP: 3.7 g/dL (ref 2.9–4.4)
ALPHA-1-GLOBULIN: 0.3 g/dL (ref 0.0–0.4)
ALPHA-2-GLOBULIN: 0.7 g/dL (ref 0.4–1.0)
Beta Globulin: 1.1 g/dL (ref 0.7–1.3)
Gamma Globulin: 1.3 g/dL (ref 0.4–1.8)
Globulin, Total: 3.5 g/dL (ref 2.2–3.9)
Total Protein ELP: 7.2 g/dL (ref 6.0–8.5)

## 2018-09-29 LAB — METHYLMALONIC ACID, SERUM: Methylmalonic Acid, Quantitative: 413 nmol/L — ABNORMAL HIGH (ref 0–378)

## 2018-10-06 ENCOUNTER — Encounter (HOSPITAL_COMMUNITY): Payer: Self-pay | Admitting: Hematology

## 2018-10-06 ENCOUNTER — Inpatient Hospital Stay (HOSPITAL_COMMUNITY): Payer: Medicare Other | Admitting: Hematology

## 2018-10-06 ENCOUNTER — Other Ambulatory Visit: Payer: Self-pay

## 2018-10-06 VITALS — BP 110/53 | HR 63 | Temp 97.9°F | Resp 18

## 2018-10-06 DIAGNOSIS — Z9981 Dependence on supplemental oxygen: Secondary | ICD-10-CM

## 2018-10-06 DIAGNOSIS — Z853 Personal history of malignant neoplasm of breast: Secondary | ICD-10-CM

## 2018-10-06 DIAGNOSIS — D509 Iron deficiency anemia, unspecified: Secondary | ICD-10-CM | POA: Diagnosis not present

## 2018-10-06 DIAGNOSIS — Z9221 Personal history of antineoplastic chemotherapy: Secondary | ICD-10-CM | POA: Diagnosis not present

## 2018-10-06 DIAGNOSIS — E538 Deficiency of other specified B group vitamins: Secondary | ICD-10-CM | POA: Diagnosis not present

## 2018-10-06 DIAGNOSIS — D649 Anemia, unspecified: Secondary | ICD-10-CM

## 2018-10-06 DIAGNOSIS — Z923 Personal history of irradiation: Secondary | ICD-10-CM

## 2018-10-06 DIAGNOSIS — Z9882 Breast implant status: Secondary | ICD-10-CM

## 2018-10-06 DIAGNOSIS — Z9012 Acquired absence of left breast and nipple: Secondary | ICD-10-CM

## 2018-10-06 NOTE — Progress Notes (Signed)
Ann Fuller, [place] [id number]   CLINIC:  Medical Oncology/Hematology  PCP:  Asencion Noble, [place] [address] [phone]   REASON FOR VISIT: Follow-up for normocytic anemia.  CURRENT THERAPY: intermittent iron infusions and oral B12   INTERVAL HISTORY:  Ann Fuller 72 y.o. female returns for routine follow-up normocytic anemia. She is here today for labs result. She has been doing well. She reports fatigue and weakness. She is on continuous home oxygen and has bilateral lower extremity edema. Her doctor placed her on lasix BID which has improved the edema. She reports her appetite is 50% and she is maintaining her weight at this time. Her energy level is very low. She denies any easy bruising or bleeding. Denies any headaches.     REVIEW OF SYSTEMS:  Review of Systems  Constitutional: Positive for chills and fatigue.  Cardiovascular: Positive for leg swelling.  Gastrointestinal: Positive for constipation.  Neurological: Positive for extremity weakness and light-headedness.  All other systems reviewed and are negative.    PAST MEDICAL/SURGICAL HISTORY:  Past Medical History:  Diagnosis Date  . Adenomatous colon polyp   . Arthritis   . Asthma   . Breast cancer ([place]) [id number]  . Bulging lumbar disc   . CHF (congestive heart failure) ([place])   . Depression   . Diverticulitis   . Dyspepsia   . Essential hypertension, benign   . GERD (gastroesophageal reflux disease)   . Heart murmur   . Hyperlipidemia   . IBS (irritable bowel syndrome)   . Internal hemorrhoid   . Right shoulder pain   . Tubular adenoma 11/2010  . Type 2 diabetes mellitus ([place])    Past Surgical History:  Procedure Laterality Date  . ABDOMINAL HYSTERECTOMY    . APPENDECTOMY    . BACK SURGERY  2007   Lumbar fusion  . CATARACT EXTRACTION W/PHACO Left 08/27/2013   Procedure: CATARACT EXTRACTION PHACO AND INTRAOCULAR LENS PLACEMENT  (IOC);  Surgeon: Tonny Branch, MD;  Location: [facility];  Service: Ophthalmology;  Laterality: Left;  CDE 4.25  . CATARACT EXTRACTION W/PHACO Right 09/24/2013   Procedure: CATARACT EXTRACTION PHACO AND INTRAOCULAR LENS PLACEMENT (IOC);  Surgeon: Tonny Branch, MD;  Location: [facility];  Service: Ophthalmology;  Laterality: Right;  CDE:  8.16  . CHOLECYSTECTOMY    . COLONOSCOPY  12/13/2010   Dr. Margarito Courser pancolonic diverticular.tubular adenoma  . COLONOSCOPY  07/13/2003  . COLONOSCOPY N/A 10/28/2015   [facility]:XNATFTD diverticulosis, multiple tubular adenomas removed. next tcs 10/2020.   . ESOPHAGOGASTRODUODENOSCOPY  12/13/2010   Dr. Jennet Maduro hernia, fundal gland type polyps  . EYE SURGERY Left    Left KPE 08/27/13  . MASTECTOMY Left 1995  . NASAL ENDOSCOPY WITH EPISTAXIS CONTROL Left 07/16/2016   Procedure: ENDOSCOPIC LEFT NASAL CAUTERY;  Surgeon: Leta Baptist, MD;  Location: [facility];  Service: ENT;  Laterality: Left;  . TONSILLECTOMY    . TUBAL LIGATION       SOCIAL HISTORY:  Social History   Socioeconomic History  . Marital status: Married    Spouse name: Not on file  . Number of children: Not on file  . Years of education: Not on file  . Highest education level: Not on file  Occupational History  . Not on file  Social Needs  . Financial resource strain: Not on file  . Food insecurity:    Worry: Not on file    Inability: Not on file  .  Transportation needs:    Medical: Not on file    Non-medical: Not on file  Tobacco Use  . Smoking status: Never Smoker  . Smokeless tobacco: Never Used  Substance and Sexual Activity  . Alcohol use: No  . Drug use: No  . Sexual activity: Yes    Birth control/protection: Surgical  Lifestyle  . Physical activity:    Days per week: Not on file    Minutes per session: Not on file  . Stress: Not on file  Relationships  . Social connections:    Talks on phone: Not on file    Gets together: Not on file    Attends religious service:  Not on file    Active member of club or organization: Not on file    Attends meetings of clubs or organizations: Not on file    Relationship status: Not on file  . Intimate partner violence:    Fear of current or ex partner: Not on file    Emotionally abused: Not on file    Physically abused: Not on file    Forced sexual activity: Not on file  Other Topics Concern  . Not on file  Social History Narrative  . Not on file    FAMILY HISTORY:  Family History  Problem Relation Age of Onset  . Colon cancer Mother   . Heart attack Father   . Heart attack Sister   . Stroke Sister   . Cancer - Lung Sister     CURRENT MEDICATIONS:  Outpatient Encounter Medications as of 10/06/2018  Medication Sig Note  . acetaminophen (TYLENOL) 650 MG CR tablet Take 650 mg by mouth every 8 (eight) hours as needed for pain.   Marland Kitchen albuterol (PROVENTIL HFA;VENTOLIN HFA) 108 (90 Base) MCG/ACT inhaler Inhale 2 puffs into the lungs every 4 (four) hours as needed for wheezing or shortness of breath.   Marland Kitchen amLODipine (NORVASC) 5 MG tablet Take 5 mg by mouth daily.   Marland Kitchen atorvastatin (LIPITOR) 40 MG tablet Take 40 mg by mouth at bedtime.    . baclofen (LIORESAL) 10 MG tablet Take 5 mg by mouth 3 (three) times daily as needed for muscle spasms.    . Benralizumab (FASENRA) 30 MG/ML SOSY Inject 30 mg into the skin every 8 (eight) weeks.  08/08/2018: Gets at [place] office.  . budesonide-formoterol (SYMBICORT) 80-4.5 MCG/ACT inhaler Inhale 2 puffs into the lungs 2 (two) times daily.   . carvedilol (COREG) 3.125 MG tablet Take 1 tablet (3.125 mg total) by mouth 2 (two) times daily with a meal.   . dicyclomine (BENTYL) 10 MG capsule TAKE 1 CAPSULE BY MOUTH PRIOR TO MEALS AS NEEDED .Marland KitchenMarland KitchenNO MORE THAN 4 TIMES DAILY MAY CAUSE DROWSINESS (Patient taking differently: Take 10 mg by mouth 3 (three) times daily before meals. )   . doxazosin (CARDURA) 4 MG tablet Take 4 mg by mouth daily.   Marland Kitchen EPINEPHrine (EPIPEN 2-PAK) 0.3 mg/0.3 mL IJ  SOAJ injection Inject 0.3 mg into the muscle daily as needed (allergic reaction).   Marland Kitchen exenatide (BYETTA 10 MCG PEN) 10 MCG/0.04ML SOLN Inject 10 mcg into the skin 2 (two) times daily with a meal.    . fluticasone (FLONASE) 50 MCG/ACT nasal spray Place 1 spray into both nostrils daily as needed for allergies or rhinitis.   . furosemide (LASIX) 40 MG tablet    . HUMALOG KWIKPEN 100 UNIT/ML KiwkPen    . Insulin Glargine (LANTUS) 100 UNIT/ML Solostar Pen Inject 26 Units into the  skin every morning.    . Insulin Lispro (HUMALOG KWIKPEN [place]) Inject 6 Units into the skin 2 (two) times daily before a meal.    . ipratropium-albuterol (DUONEB) 0.5-2.5 (3) MG/3ML SOLN Take 3 mLs by nebulization every 4 (four) hours. (Patient taking differently: Take 3 mLs by nebulization every 6 (six) hours as needed (wheezing and shortness of breath). )   . LORazepam (ATIVAN) 1 MG tablet Take 1 mg by mouth 2 (two) times daily as needed for anxiety or sleep.    . magnesium oxide (MAG-OX) 400 MG tablet Take 400 mg by mouth daily.   . mometasone-formoterol (DULERA) 100-5 MCG/ACT AERO Inhale 2 puffs into the lungs 2 (two) times daily.   . montelukast (SINGULAIR) 10 MG tablet TAKE ONE TABLET BY MOUTH EVERY NIGHT AT BEDTIME (Patient taking differently: Take 10 mg by mouth at bedtime. )   . olmesartan (BENICAR) 40 MG tablet Take 40 mg by mouth daily.    . pantoprazole (PROTONIX) 40 MG tablet TAKE ONE TABLET BY MOUTH TWICE A DAY BEFORE A MEAL   . sertraline (ZOLOFT) 50 MG tablet Take 50 mg by mouth daily.    Marland Kitchen Spacer/Aero-Holding Chambers (AEROCHAMBER MV) inhaler Use as instructed   . SPIRIVA HANDIHALER 18 MCG inhalation capsule    . temazepam (RESTORIL) 15 MG capsule Take 15 mg by mouth at bedtime as needed for sleep.   . vitamin C (ASCORBIC ACID) 500 MG tablet Take 500 mg by mouth daily.     No facility-administered encounter medications on file as of 10/06/2018.     ALLERGIES:  Allergies  Allergen Reactions  . Codeine  Swelling    Tongue swells  . Ceftin [Cefuroxime Axetil] Swelling  . Metformin And Related Diarrhea  . Nexium [Esomeprazole Magnesium] Diarrhea  . Omnicef [Cefdinir] Diarrhea  . Sulfa Antibiotics Rash     PHYSICAL EXAM:  ECOG Performance status: 1  Vitals:   10/06/18 1041  BP: (!) 110/53  Pulse: 63  Resp: 18  Temp: 97.9 F (36.6 C)  SpO2: 100%    Physical Exam  Constitutional: She is oriented to person, place, and time. She appears well-developed and well-nourished.  Musculoskeletal: Normal range of motion.  Neurological: She is alert and oriented to person, place, and time.  Skin: Skin is warm and dry.  Psychiatric: She has a normal mood and affect. Her behavior is normal. Judgment and thought content normal.     LABORATORY DATA:  I have reviewed the labs as listed.  CBC    Component Value Date/Time   WBC 8.0 09/26/2018 1435   RBC 3.31 (L) 09/26/2018 1435   RBC 3.31 (L) 09/26/2018 1435   HGB 9.4 (L) 09/26/2018 1435   HGB 10.7 (L) 01/22/2017 1008   HCT 29.8 (L) 09/26/2018 1435   HCT 33.0 (L) 01/22/2017 1008   PLT 249 09/26/2018 1435   PLT 280 01/22/2017 1008   MCV 90.0 09/26/2018 1435   MCV 88 01/22/2017 1008   MCH 28.4 09/26/2018 1435   MCHC 31.5 09/26/2018 1435   RDW 14.7 09/26/2018 1435   RDW 14.4 01/22/2017 1008   LYMPHSABS 1.2 09/26/2018 1435   LYMPHSABS 2.7 01/22/2017 1008   MONOABS 0.8 09/26/2018 1435   EOSABS 0.0 09/26/2018 1435   EOSABS 0.3 01/22/2017 1008   BASOSABS 0.0 09/26/2018 1435   BASOSABS 0.0 01/22/2017 1008   CMP Latest Ref Rng & Units 09/26/2018 08/14/2018 08/13/2018  Glucose 70 - 99 mg/dL 102(H) 100(H) 126(H)  BUN 8 - 23 mg/dL  32(H) 21 25(H)  Creatinine 0.44 - 1.00 mg/dL 1.28(H) 0.98 1.29(H)  Sodium 135 - 145 mmol/L 135 134(L) 132(L)  Potassium 3.5 - 5.1 mmol/L 3.8 3.9 4.0  Chloride 98 - 111 mmol/L 95(L) 91(L) 95(L)  CO2 22 - 32 mmol/L 30 34(H) 31  Calcium 8.9 - 10.3 mg/dL 9.1 9.4 8.6(L)  Total Protein 6.5 - 8.1 g/dL 8.0 - -    Total Bilirubin 0.3 - 1.2 mg/dL 0.7 - -  Alkaline Phos 38 - 126 U/L 61 - -  AST 15 - 41 U/L 18 - -  ALT 0 - 44 U/L 15 - -         ASSESSMENT & PLAN:   Normocytic anemia 1.  Normocytic anemia: -Differential diagnosis includes CKD and relative iron deficiency state. - recent CBC on 09/03/2018 showed hemoglobin of 8.3. - CBC repeated by Dr. Willey Blade on 09/11/2018 shows hemoglobin of 9.3, MCV of 86 with normal white count and platelet count. - Serum ferritin was 92, percent saturation was 15 with a TIBC of 336.  Vitamin D74 was 128 and folic acid was 78.6. -Last colonoscopy was on 10/28/2015 which showed diverticulosis and tubular adenoma.  Recent stool for occult blood was also found to be negative by Dr. Willey Blade.  Patient denies any bleeding per rectum or melena. - We reviewed the blood work from 09/26/2018.  Hemoglobin was 9.4.  SPEP was negative.  Because of her relative iron deficiency state, I have recommended 2 infusions of Feraheme.  We talked about the side effects including rare chance of serious adverse reactions.  We will schedule her for her first infusion.  I will see her back in 2 months with repeat blood work.  This will likely improve her tiredness and her hemoglobin.  2.  History of left breast cancer: - Was initially diagnosed with left breast cancer in 1990, status post lumpectomy and lymph node dissection.  She also received chemotherapy followed by radiation followed by 5 years of tamoxifen. - Had a recurrence of breast cancer in the left breast in 1995 and underwent mastectomy, breast implant  followed by chemotherapy.  3.  B12 deficiency: -She has borderline B12 level with the elevated methylmalonic acid of 413. -She was told to start taking vitamin B12 1 mg daily.      Orders placed this encounter:  Orders Placed This Encounter  Procedures  . CBC with Differential/Platelet  . Comprehensive metabolic panel  . Ferritin  . Iron and TIBC  . Vitamin B12  . Folate       Derek Jack, MD [place] [phone]

## 2018-10-06 NOTE — Patient Instructions (Addendum)
[place] at [facility] Discharge Instructions  Please start taking vitamin B12 1mg  you can get this at your local pharmacy or [place].  Please follow up with [place] in 2 months please have labs drawn prior to your visit,    Thank you for choosing [place] at [facility] to provide your oncology and hematology care.  To afford each patient quality time with our provider, please arrive at least 15 minutes before your scheduled appointment time.   If you have a lab appointment with the [place] please come in thru the  Main Entrance and check in at the main information desk  You need to re-schedule your appointment should you arrive 10 or more minutes late.  We strive to give you quality time with our providers, and arriving late affects you and other patients whose appointments are after yours.  Also, if you no show three or more times for appointments you may be dismissed from the clinic at the providers discretion.     Again, thank you for choosing [facility].  Our hope is that these requests will decrease the amount of time that you wait before being seen by our physicians.       _____________________________________________________________  Should you have questions after your visit to [facility], please contact our office at [phone] between the hours of 8:00 a.m. and 4:30 p.m.  Voicemails left after 4:00 p.m. will not be returned until the following business day.  For prescription refill requests, have your pharmacy contact our office and allow 72 hours.    Cancer Center Support Programs:   > Cancer Support Group  2nd Tuesday of the month 1pm-2pm, Journey Room

## 2018-10-06 NOTE — Assessment & Plan Note (Signed)
1.  Normocytic anemia: -Differential diagnosis includes CKD and relative iron deficiency state. - recent CBC on 09/03/2018 showed hemoglobin of 8.3. - CBC repeated by Dr. Willey Blade on 09/11/2018 shows hemoglobin of 9.3, MCV of 86 with normal white count and platelet count. - Serum ferritin was 92, percent saturation was 15 with a TIBC of 336.  Vitamin H15 was 056 and folic acid was 97.9. -Last colonoscopy was on 10/28/2015 which showed diverticulosis and tubular adenoma.  Recent stool for occult blood was also found to be negative by Dr. Willey Blade.  Patient denies any bleeding per rectum or melena. - We reviewed the blood work from 09/26/2018.  Hemoglobin was 9.4.  SPEP was negative.  Because of her relative iron deficiency state, I have recommended 2 infusions of Feraheme.  We talked about the side effects including rare chance of serious adverse reactions.  We will schedule her for her first infusion.  I will see her back in 2 months with repeat blood work.  This will likely improve her tiredness and her hemoglobin.  2.  History of left breast cancer: - Was initially diagnosed with left breast cancer in 1990, status post lumpectomy and lymph node dissection.  She also received chemotherapy followed by radiation followed by 5 years of tamoxifen. - Had a recurrence of breast cancer in the left breast in 1995 and underwent mastectomy, breast implant  followed by chemotherapy.  3.  B12 deficiency: -She has borderline B12 level with the elevated methylmalonic acid of 413. -She was told to start taking vitamin B12 1 mg daily.

## 2018-10-07 ENCOUNTER — Ambulatory Visit (HOSPITAL_COMMUNITY): Payer: Medicare Other

## 2018-10-07 ENCOUNTER — Inpatient Hospital Stay (HOSPITAL_COMMUNITY): Payer: Medicare Other

## 2018-10-07 ENCOUNTER — Other Ambulatory Visit (HOSPITAL_COMMUNITY): Payer: Self-pay | Admitting: Nurse Practitioner

## 2018-10-07 VITALS — BP 119/46 | HR 63 | Temp 97.6°F | Resp 18

## 2018-10-07 DIAGNOSIS — D649 Anemia, unspecified: Secondary | ICD-10-CM

## 2018-10-07 DIAGNOSIS — D509 Iron deficiency anemia, unspecified: Secondary | ICD-10-CM | POA: Diagnosis not present

## 2018-10-07 MED ORDER — SODIUM CHLORIDE 0.9 % IV SOLN
510.0000 mg | Freq: Once | INTRAVENOUS | Status: AC
  Administered 2018-10-07: 510 mg via INTRAVENOUS
  Filled 2018-10-07: qty 17

## 2018-10-07 MED ORDER — SODIUM CHLORIDE 0.9% FLUSH
3.0000 mL | Freq: Once | INTRAVENOUS | Status: AC | PRN
  Administered 2018-10-07: 3 mL

## 2018-10-07 MED ORDER — SODIUM CHLORIDE 0.9 % IV SOLN
Freq: Once | INTRAVENOUS | Status: AC
  Administered 2018-10-07: 15:00:00 via INTRAVENOUS

## 2018-10-07 NOTE — Patient Instructions (Signed)
[facility] at [facility] _______________________________________________________________  Thank you for choosing [facility] at [facility] to provide your oncology and hematology care.  To afford each patient quality time with our providers, please arrive at least 15 minutes before your scheduled appointment.  You need to re-schedule your appointment if you arrive 10 or more minutes late.  We strive to give you quality time with our providers, and arriving late affects you and other patients whose appointments are after yours.  Also, if you no show three or more times for appointments you may be dismissed from the clinic.  Again, thank you for choosing [facility]. Our hope is that these requests will allow you access to exceptional care and in a timely manner. _______________________________________________________________  If you have questions after your visit, please contact our office at [phone] between the hours of 8:30 a.m. and 5:00 p.m. Voicemails left after 4:30 p.m. will not be returned until the following business day. _______________________________________________________________  For prescription refill requests, have your pharmacy contact our office. _______________________________________________________________  Recommendations made by the consultant and any test results will be sent to your referring physician. _______________________________________________________________ 

## 2018-10-07 NOTE — Progress Notes (Signed)
Ann Fuller tolerated feraheme without incident or complaint. VSS upon completion of treatment. Discharged via wheelchair in satisfactory condition in presence of husband.

## 2018-10-14 ENCOUNTER — Encounter (HOSPITAL_COMMUNITY): Payer: Self-pay

## 2018-10-14 ENCOUNTER — Inpatient Hospital Stay (HOSPITAL_COMMUNITY): Payer: Medicare Other

## 2018-10-14 VITALS — BP 122/49 | HR 67 | Temp 97.5°F | Resp 18

## 2018-10-14 DIAGNOSIS — D509 Iron deficiency anemia, unspecified: Secondary | ICD-10-CM | POA: Diagnosis not present

## 2018-10-14 DIAGNOSIS — D649 Anemia, unspecified: Secondary | ICD-10-CM

## 2018-10-14 MED ORDER — SODIUM CHLORIDE 0.9 % IV SOLN
510.0000 mg | Freq: Once | INTRAVENOUS | Status: AC
  Administered 2018-10-14: 510 mg via INTRAVENOUS
  Filled 2018-10-14: qty 17

## 2018-10-14 MED ORDER — SODIUM CHLORIDE 0.9 % IV SOLN
Freq: Once | INTRAVENOUS | Status: AC
  Administered 2018-10-14: 12:00:00 via INTRAVENOUS

## 2018-10-14 NOTE — Progress Notes (Signed)
Ann Fuller tolerated Feraheme infusion well without complaints or incident. VSS upon discharge. Pt discharged via wheelchair in satisfactory condition accompanied by family member

## 2018-10-14 NOTE — Patient Instructions (Signed)
[place] at [facility] Discharge Instructions  Received Feraheme infusion today. Follow-up as scheduled. Call clinic for any questions ior concerns   Thank you for choosing [place] at [facility] to provide your oncology and hematology care.  To afford each patient quality time with our provider, please arrive at least 15 minutes before your scheduled appointment time.   If you have a lab appointment with the [place] please come in thru the  Main Entrance and check in at the main information desk  You need to re-schedule your appointment should you arrive 10 or more minutes late.  We strive to give you quality time with our providers, and arriving late affects you and other patients whose appointments are after yours.  Also, if you no show three or more times for appointments you may be dismissed from the clinic at the providers discretion.     Again, thank you for choosing [facility].  Our hope is that these requests will decrease the amount of time that you wait before being seen by our physicians.       _____________________________________________________________  Should you have questions after your visit to [facility], please contact our office at [phone] between the hours of 8:00 a.m. and 4:30 p.m.  Voicemails left after 4:00 p.m. will not be returned until the following business day.  For prescription refill requests, have your pharmacy contact our office and allow 72 hours.    Cancer Center Support Programs:   > Cancer Support Group  2nd Tuesday of the month 1pm-2pm, Journey Room

## 2018-12-08 ENCOUNTER — Inpatient Hospital Stay (HOSPITAL_COMMUNITY): Payer: Medicare Other | Attending: Hematology

## 2018-12-08 DIAGNOSIS — Z9012 Acquired absence of left breast and nipple: Secondary | ICD-10-CM | POA: Diagnosis not present

## 2018-12-08 DIAGNOSIS — I10 Essential (primary) hypertension: Secondary | ICD-10-CM | POA: Diagnosis not present

## 2018-12-08 DIAGNOSIS — E119 Type 2 diabetes mellitus without complications: Secondary | ICD-10-CM | POA: Diagnosis not present

## 2018-12-08 DIAGNOSIS — E538 Deficiency of other specified B group vitamins: Secondary | ICD-10-CM | POA: Insufficient documentation

## 2018-12-08 DIAGNOSIS — Z79899 Other long term (current) drug therapy: Secondary | ICD-10-CM | POA: Diagnosis not present

## 2018-12-08 DIAGNOSIS — D649 Anemia, unspecified: Secondary | ICD-10-CM | POA: Diagnosis present

## 2018-12-08 DIAGNOSIS — Z853 Personal history of malignant neoplasm of breast: Secondary | ICD-10-CM | POA: Insufficient documentation

## 2018-12-08 DIAGNOSIS — Z794 Long term (current) use of insulin: Secondary | ICD-10-CM | POA: Insufficient documentation

## 2018-12-08 LAB — CBC WITH DIFFERENTIAL/PLATELET
Abs Immature Granulocytes: 0.02 10*3/uL (ref 0.00–0.07)
BASOS PCT: 0 %
Basophils Absolute: 0 10*3/uL (ref 0.0–0.1)
EOS ABS: 0 10*3/uL (ref 0.0–0.5)
Eosinophils Relative: 0 %
HCT: 34.1 % — ABNORMAL LOW (ref 36.0–46.0)
Hemoglobin: 10.4 g/dL — ABNORMAL LOW (ref 12.0–15.0)
Immature Granulocytes: 0 %
Lymphocytes Relative: 19 %
Lymphs Abs: 1.2 10*3/uL (ref 0.7–4.0)
MCH: 29.1 pg (ref 26.0–34.0)
MCHC: 30.5 g/dL (ref 30.0–36.0)
MCV: 95.3 fL (ref 80.0–100.0)
MONO ABS: 0.8 10*3/uL (ref 0.1–1.0)
MONOS PCT: 12 %
Neutro Abs: 4.5 10*3/uL (ref 1.7–7.7)
Neutrophils Relative %: 69 %
PLATELETS: 232 10*3/uL (ref 150–400)
RBC: 3.58 MIL/uL — AB (ref 3.87–5.11)
RDW: 15.2 % (ref 11.5–15.5)
WBC: 6.5 10*3/uL (ref 4.0–10.5)
nRBC: 0 % (ref 0.0–0.2)

## 2018-12-08 LAB — FOLATE: Folate: 9.3 ng/mL (ref >5.9)

## 2018-12-08 LAB — VITAMIN B12: Vitamin B-12: 645 pg/mL (ref 180–914)

## 2018-12-08 LAB — COMPREHENSIVE METABOLIC PANEL
ALT: 17 U/L (ref 0–44)
ANION GAP: 9 (ref 5–15)
AST: 21 U/L (ref 15–41)
Albumin: 3.9 g/dL (ref 3.5–5.0)
Alkaline Phosphatase: 63 U/L (ref 38–126)
BUN: 18 mg/dL (ref 8–23)
CHLORIDE: 100 mmol/L (ref 98–111)
CO2: 30 mmol/L (ref 22–32)
CREATININE: 0.95 mg/dL (ref 0.44–1.00)
Calcium: 9.4 mg/dL (ref 8.9–10.3)
GFR calc non Af Amer: >60 mL/min (ref >60)
Glucose, Bld: 127 mg/dL — ABNORMAL HIGH (ref 70–99)
POTASSIUM: 3.9 mmol/L (ref 3.5–5.1)
Sodium: 139 mmol/L (ref 135–145)
Total Bilirubin: 0.6 mg/dL (ref 0.3–1.2)
Total Protein: 7.7 g/dL (ref 6.5–8.1)

## 2018-12-08 LAB — IRON AND TIBC
IRON: 56 ug/dL (ref 28–170)
Saturation Ratios: 20 % (ref 10.4–31.8)
TIBC: 282 ug/dL (ref 250–450)
UIBC: 226 ug/dL

## 2018-12-08 LAB — FERRITIN: FERRITIN: 238 ng/mL (ref 11–307)

## 2018-12-10 ENCOUNTER — Other Ambulatory Visit: Payer: Self-pay

## 2018-12-10 ENCOUNTER — Inpatient Hospital Stay (HOSPITAL_COMMUNITY): Payer: Medicare Other | Admitting: Hematology

## 2018-12-10 ENCOUNTER — Encounter (HOSPITAL_COMMUNITY): Payer: Self-pay | Admitting: Hematology

## 2018-12-10 DIAGNOSIS — Z853 Personal history of malignant neoplasm of breast: Secondary | ICD-10-CM | POA: Diagnosis not present

## 2018-12-10 DIAGNOSIS — Z9012 Acquired absence of left breast and nipple: Secondary | ICD-10-CM | POA: Diagnosis not present

## 2018-12-10 DIAGNOSIS — D649 Anemia, unspecified: Secondary | ICD-10-CM | POA: Diagnosis not present

## 2018-12-10 DIAGNOSIS — E538 Deficiency of other specified B group vitamins: Secondary | ICD-10-CM

## 2018-12-10 DIAGNOSIS — Z79899 Other long term (current) drug therapy: Secondary | ICD-10-CM

## 2018-12-10 DIAGNOSIS — Z794 Long term (current) use of insulin: Secondary | ICD-10-CM

## 2018-12-10 DIAGNOSIS — I10 Essential (primary) hypertension: Secondary | ICD-10-CM

## 2018-12-10 DIAGNOSIS — E119 Type 2 diabetes mellitus without complications: Secondary | ICD-10-CM

## 2018-12-10 NOTE — Progress Notes (Signed)
[place], [place] [address]   CLINIC:  Medical Oncology/Hematology  PCP:  Asencion Noble, [place] [address] [phone]   REASON FOR VISIT: Follow-up for normocytic anemia.  CURRENT THERAPY: intermittent iron infusions and oral B12   INTERVAL HISTORY:  Ms. Streb 72 y.o. female returns for routine follow-up for normocytic anemia. She is here today with her husband. She is still on oxygen and she is SOB with exertion. She see a cardiologist at [facility] for her breathing issues. She felt a little better after the iron infusions. She has intermittent constipation alternating with diarrhea. Denies any bleeding or dark stools. Denies any fevers or recent infections. Denies any nausea or vomiting. Denies any new pains. She reports her appetite at 50% and energy level at 0%. She does report joint pain in her leg and arms and back after her second iron infusion. She had no problems with the first infusion. It subsided after a day or two.     REVIEW OF SYSTEMS:  Review of Systems  HENT:   Positive for trouble swallowing.   Gastrointestinal: Positive for constipation and diarrhea.  All other systems reviewed and are negative.    PAST MEDICAL/SURGICAL HISTORY:  Past Medical History:  Diagnosis Date  . Adenomatous colon polyp   . Arthritis   . Asthma   . Breast cancer ([place]) [id number]  . Bulging lumbar disc   . CHF (congestive heart failure) ([place])   . Depression   . Diverticulitis   . Dyspepsia   . Essential hypertension, benign   . GERD (gastroesophageal reflux disease)   . Heart murmur   . Hyperlipidemia   . IBS (irritable bowel syndrome)   . Internal hemorrhoid   . Right shoulder pain   . Tubular adenoma 11/2010  . Type 2 diabetes mellitus ([place])    Past Surgical History:  Procedure Laterality Date  . ABDOMINAL HYSTERECTOMY    . APPENDECTOMY    . BACK SURGERY  2007   Lumbar fusion  . CATARACT EXTRACTION  W/PHACO Left 08/27/2013   Procedure: CATARACT EXTRACTION PHACO AND INTRAOCULAR LENS PLACEMENT (IOC);  Surgeon: Tonny Branch, MD;  Location: [facility];  Service: Ophthalmology;  Laterality: Left;  CDE 4.25  . CATARACT EXTRACTION W/PHACO Right 09/24/2013   Procedure: CATARACT EXTRACTION PHACO AND INTRAOCULAR LENS PLACEMENT (IOC);  Surgeon: Tonny Branch, MD;  Location: [facility];  Service: Ophthalmology;  Laterality: Right;  CDE:  8.16  . CHOLECYSTECTOMY    . COLONOSCOPY  12/13/2010   Dr. Margarito Courser pancolonic diverticular.tubular adenoma  . COLONOSCOPY  07/13/2003  . COLONOSCOPY N/A 10/28/2015   OXB:DZHGDJM diverticulosis, multiple tubular adenomas removed. next tcs 10/2020.   . ESOPHAGOGASTRODUODENOSCOPY  12/13/2010   Dr. Jennet Maduro hernia, fundal gland type polyps  . EYE SURGERY Left    Left KPE 08/27/13  . MASTECTOMY Left 1995  . NASAL ENDOSCOPY WITH EPISTAXIS CONTROL Left 07/16/2016   Procedure: ENDOSCOPIC LEFT NASAL CAUTERY;  Surgeon: Leta Baptist, MD;  Location: [place] [facility];  Service: ENT;  Laterality: Left;  . TONSILLECTOMY    . TUBAL LIGATION       SOCIAL HISTORY:  Social History   Socioeconomic History  . Marital status: Married    Spouse name: Not on file  . Number of children: Not on file  . Years of education: Not on file  . Highest education level: Not on file  Occupational History  . Not on file  Social Needs  .  Financial resource strain: Not on file  . Food insecurity:    Worry: Not on file    Inability: Not on file  . Transportation needs:    Medical: Not on file    Non-medical: Not on file  Tobacco Use  . Smoking status: Never Smoker  . Smokeless tobacco: Never Used  Substance and Sexual Activity  . Alcohol use: No  . Drug use: No  . Sexual activity: Yes    Birth control/protection: Surgical  Lifestyle  . Physical activity:    Days per week: Not on file    Minutes per session: Not on file  . Stress: Not on file  Relationships  . Social connections:      Talks on phone: Not on file    Gets together: Not on file    Attends religious service: Not on file    Active member of club or organization: Not on file    Attends meetings of clubs or organizations: Not on file    Relationship status: Not on file  . Intimate partner violence:    Fear of current or ex partner: Not on file    Emotionally abused: Not on file    Physically abused: Not on file    Forced sexual activity: Not on file  Other Topics Concern  . Not on file  Social History Narrative  . Not on file    FAMILY HISTORY:  Family History  Problem Relation Age of Onset  . Colon cancer Mother   . Heart attack Father   . Heart attack Sister   . Stroke Sister   . Cancer - Lung Sister     CURRENT MEDICATIONS:  Outpatient Encounter Medications as of 12/10/2018  Medication Sig Note  . acetaminophen (TYLENOL) 650 MG CR tablet Take 650 mg by mouth every 8 (eight) hours as needed for pain.   Marland Kitchen albuterol (PROVENTIL HFA;VENTOLIN HFA) 108 (90 Base) MCG/ACT inhaler Inhale 2 puffs into the lungs every 4 (four) hours as needed for wheezing or shortness of breath.   Marland Kitchen amLODipine (NORVASC) 5 MG tablet Take 5 mg by mouth daily.   Marland Kitchen atorvastatin (LIPITOR) 40 MG tablet Take 40 mg by mouth at bedtime.    . baclofen (LIORESAL) 10 MG tablet Take 5 mg by mouth 3 (three) times daily as needed for muscle spasms.    . Benralizumab (FASENRA) 30 MG/ML SOSY Inject 30 mg into the skin every 8 (eight) weeks.  08/08/2018: Gets at [place] office.  . budesonide-formoterol (SYMBICORT) 80-4.5 MCG/ACT inhaler Inhale 2 puffs into the lungs 2 (two) times daily.   . carvedilol (COREG) 3.125 MG tablet Take 1 tablet (3.125 mg total) by mouth 2 (two) times daily with a meal.   . dicyclomine (BENTYL) 10 MG capsule TAKE 1 CAPSULE BY MOUTH PRIOR TO MEALS AS NEEDED .Marland KitchenMarland KitchenNO MORE THAN 4 TIMES DAILY MAY CAUSE DROWSINESS (Patient taking differently: Take 10 mg by mouth 3 (three) times daily before meals. )   . doxazosin  (CARDURA) 4 MG tablet Take 4 mg by mouth daily.   Marland Kitchen EPINEPHrine (EPIPEN 2-PAK) 0.3 mg/0.3 mL IJ SOAJ injection Inject 0.3 mg into the muscle daily as needed (allergic reaction).   Marland Kitchen exenatide (BYETTA 10 MCG PEN) 10 MCG/0.04ML SOLN Inject 10 mcg into the skin 2 (two) times daily with a meal.    . fluticasone (FLONASE) 50 MCG/ACT nasal spray Place 1 spray into both nostrils daily as needed for allergies or rhinitis.   . furosemide (LASIX) 40  MG tablet 2 (two) times daily.    Marland Kitchen HUMALOG KWIKPEN 100 UNIT/ML KiwkPen    . Insulin Glargine (LANTUS) 100 UNIT/ML Solostar Pen Inject 26 Units into the skin every morning.    . Insulin Lispro (HUMALOG KWIKPEN [place]) Inject 6 Units into the skin 2 (two) times daily before a meal.    . ipratropium-albuterol (DUONEB) 0.5-2.5 (3) MG/3ML SOLN Take 3 mLs by nebulization every 4 (four) hours. (Patient taking differently: Take 3 mLs by nebulization every 6 (six) hours as needed (wheezing and shortness of breath). )   . LORazepam (ATIVAN) 1 MG tablet Take 1 mg by mouth 2 (two) times daily as needed for anxiety or sleep.    . magnesium oxide (MAG-OX) 400 MG tablet Take 400 mg by mouth daily.   . mometasone-formoterol (DULERA) 100-5 MCG/ACT AERO Inhale 2 puffs into the lungs 2 (two) times daily.   . montelukast (SINGULAIR) 10 MG tablet TAKE ONE TABLET BY MOUTH EVERY NIGHT AT BEDTIME (Patient taking differently: Take 10 mg by mouth at bedtime. )   . olmesartan (BENICAR) 40 MG tablet Take 40 mg by mouth daily.    . pantoprazole (PROTONIX) 40 MG tablet TAKE ONE TABLET BY MOUTH TWICE A DAY BEFORE A MEAL   . sertraline (ZOLOFT) 50 MG tablet Take 50 mg by mouth daily.    Marland Kitchen Spacer/Aero-Holding Chambers (AEROCHAMBER MV) inhaler Use as instructed   . SPIRIVA HANDIHALER 18 MCG inhalation capsule    . temazepam (RESTORIL) 15 MG capsule Take 15 mg by mouth at bedtime as needed for sleep.   . vitamin C (ASCORBIC ACID) 500 MG tablet Take 500 mg by mouth daily.     No  facility-administered encounter medications on file as of 12/10/2018.     ALLERGIES:  Allergies  Allergen Reactions  . Codeine Swelling    Tongue swells  . Ceftin [Cefuroxime Axetil] Swelling  . Metformin And Related Diarrhea  . Nexium [Esomeprazole Magnesium] Diarrhea  . Omnicef [Cefdinir] Diarrhea  . Sulfa Antibiotics Rash     PHYSICAL EXAM:  ECOG Performance status: 1  Vitals:   12/10/18 1127  BP: (!) 127/56  Pulse: 72  Resp: 18  Temp: 98.3 F (36.8 C)  SpO2: 93%   Filed Weights   12/10/18 1127  Weight: 234 lb (106.1 kg)    Physical Exam Constitutional:      Appearance: Normal appearance. She is normal weight.  Cardiovascular:     Rate and Rhythm: Normal rate and regular rhythm.     Heart sounds: Normal heart sounds.  Pulmonary:     Effort: Pulmonary effort is normal.     Breath sounds: Normal breath sounds.  Musculoskeletal: Normal range of motion.  Skin:    General: Skin is warm and dry.  Neurological:     Mental Status: She is alert and oriented to person, place, and time. Mental status is at baseline.  Psychiatric:        Mood and Affect: Mood normal.        Behavior: Behavior normal.        Thought Content: Thought content normal.        Judgment: Judgment normal.      LABORATORY DATA:  I have reviewed the labs as listed.  CBC    Component Value Date/Time   WBC 6.5 12/08/2018 1121   RBC 3.58 (L) 12/08/2018 1121   HGB 10.4 (L) 12/08/2018 1121   HGB 10.7 (L) 01/22/2017 1008   HCT 34.1 (L) 12/08/2018 1121  HCT 33.0 (L) 01/22/2017 1008   PLT 232 12/08/2018 1121   PLT 280 01/22/2017 1008   MCV 95.3 12/08/2018 1121   MCV 88 01/22/2017 1008   MCH 29.1 12/08/2018 1121   MCHC 30.5 12/08/2018 1121   RDW 15.2 12/08/2018 1121   RDW 14.4 01/22/2017 1008   LYMPHSABS 1.2 12/08/2018 1121   LYMPHSABS 2.7 01/22/2017 1008   MONOABS 0.8 12/08/2018 1121   EOSABS 0.0 12/08/2018 1121   EOSABS 0.3 01/22/2017 1008   BASOSABS 0.0 12/08/2018 1121    BASOSABS 0.0 01/22/2017 1008   CMP Latest Ref Rng & Units 12/08/2018 09/26/2018 08/14/2018  Glucose 70 - 99 mg/dL 127(H) 102(H) 100(H)  BUN 8 - 23 mg/dL 18 32(H) 21  Creatinine 0.44 - 1.00 mg/dL 0.95 1.28(H) 0.98  Sodium 135 - 145 mmol/L 139 135 134(L)  Potassium 3.5 - 5.1 mmol/L 3.9 3.8 3.9  Chloride 98 - 111 mmol/L 100 95(L) 91(L)  CO2 22 - 32 mmol/L 30 30 34(H)  Calcium 8.9 - 10.3 mg/dL 9.4 9.1 9.4  Total Protein 6.5 - 8.1 g/dL 7.7 8.0 -  Total Bilirubin 0.3 - 1.2 mg/dL 0.6 0.7 -  Alkaline Phos 38 - 126 U/L 63 61 -  AST 15 - 41 U/L 21 18 -  ALT 0 - 44 U/L 17 15 -       DIAGNOSTIC IMAGING:  I have independently reviewed the scans and discussed with the patient.   I have reviewed Francene Finders, NP's note and agree with the documentation.  I personally performed a face-to-face visit, made revisions and my assessment and plan is as follows.    ASSESSMENT & PLAN:   Normocytic anemia 1.  Normocytic anemia: -Differential diagnosis includes CKD and relative iron deficiency state. - recent CBC on 09/03/2018 showed hemoglobin of 8.3. - CBC repeated by Dr. Willey Blade on 09/11/2018 shows hemoglobin of 9.3, MCV of 86 with normal white count and platelet count. - Serum ferritin was 92, percent saturation was 15 with a TIBC of 336.  Vitamin Y69 was 485 and folic acid was 46.2.  SPEP was negative. -Last colonoscopy was on 10/28/2015 which showed diverticulosis and tubular adenoma.  Recent stool for occult blood was also found to be negative by Dr. Willey Blade.  Patient denies any bleeding per rectum or melena. - She received Feraheme on 10/07/2018 and 10/14/2018.  After the second infusion she has experienced some pains in the legs, back, knees and elbows.  However she had been having those pains on and off until few days ago.  I do not believe these are drug-induced.  She does have a history of arthritis. -I have reviewed her blood work.  Hemoglobin improved to 10.4.  Ferritin is in the 200 range.  She  does not require any parenteral iron at this time.  Folic acid and V03 are within normal limits. - We will recheck her counts in 3 months.  She was told to come back sooner should she have severe tiredness or shortness of breath.  2.  History of left breast cancer: - Was initially diagnosed with left breast cancer in 1990, status post lumpectomy and lymph node dissection.  She also received chemotherapy followed by radiation followed by 5 years of tamoxifen. - Had a recurrence of breast cancer in the left breast in 1995 and underwent mastectomy, breast implant  followed by chemotherapy.  3.  B12 deficiency: -She has borderline B12 level with the elevated methylmalonic acid of 413. -She will continue vitamin B12 1  mg daily.      Orders placed this encounter:  Orders Placed This Encounter  Procedures  . CBC with Differential/Platelet  . Comprehensive metabolic panel  . Ferritin  . Iron and TIBC  . Lactate dehydrogenase  . CBC with Differential/Platelet  . Comprehensive metabolic panel  . Ferritin  . Iron and TIBC  . Vitamin B12  . Folate      Derek Jack, MD [place] [phone]

## 2018-12-10 NOTE — Patient Instructions (Signed)
[facility] Discharge Instructions  Follow up in 3 months with labs prior.    Thank you for choosing [facility] at [facility] to provide your oncology and hematology care.  To afford each patient quality time with our provider, please arrive at least 15 minutes before your scheduled appointment time.   If you have a lab appointment with the Cancer Center please come in thru the  Main Entrance and check in at the main information desk  You need to re-schedule your appointment should you arrive 10 or more minutes late.  We strive to give you quality time with our providers, and arriving late affects you and other patients whose appointments are after yours.  Also, if you no show three or more times for appointments you may be dismissed from the clinic at the providers discretion.     Again, thank you for choosing [facility].  Our hope is that these requests will decrease the amount of time that you wait before being seen by our physicians.       _____________________________________________________________  Should you have questions after your visit to [facility], please contact our office at [phone] between the hours of 8:00 a.m. and 4:30 p.m.  Voicemails left after 4:00 p.m. will not be returned until the following business day.  For prescription refill requests, have your pharmacy contact our office and allow 72 hours.    Cancer Center Support Programs:   > Cancer Support Group  2nd Tuesday of the month 1pm-2pm, Journey Room    

## 2018-12-10 NOTE — Assessment & Plan Note (Signed)
1.  Normocytic anemia: -Differential diagnosis includes CKD and relative iron deficiency state. - recent CBC on 09/03/2018 showed hemoglobin of 8.3. - CBC repeated by Dr. Willey Blade on 09/11/2018 shows hemoglobin of 9.3, MCV of 86 with normal white count and platelet count. - Serum ferritin was 92, percent saturation was 15 with a TIBC of 336.  Vitamin O75 was 643 and folic acid was 32.9.  SPEP was negative. -Last colonoscopy was on 10/28/2015 which showed diverticulosis and tubular adenoma.  Recent stool for occult blood was also found to be negative by Dr. Willey Blade.  Patient denies any bleeding per rectum or melena. - She received Feraheme on 10/07/2018 and 10/14/2018.  After the second infusion she has experienced some pains in the legs, back, knees and elbows.  However she had been having those pains on and off until few days ago.  I do not believe these are drug-induced.  She does have a history of arthritis. -I have reviewed her blood work.  Hemoglobin improved to 10.4.  Ferritin is in the 200 range.  She does not require any parenteral iron at this time.  Folic acid and J18 are within normal limits. - We will recheck her counts in 3 months.  She was told to come back sooner should she have severe tiredness or shortness of breath.  2.  History of left breast cancer: - Was initially diagnosed with left breast cancer in 1990, status post lumpectomy and lymph node dissection.  She also received chemotherapy followed by radiation followed by 5 years of tamoxifen. - Had a recurrence of breast cancer in the left breast in 1995 and underwent mastectomy, breast implant  followed by chemotherapy.  3.  B12 deficiency: -She has borderline B12 level with the elevated methylmalonic acid of 413. -She will continue vitamin B12 1 mg daily.

## 2019-01-13 ENCOUNTER — Ambulatory Visit: Payer: Medicare Other | Admitting: Nurse Practitioner

## 2019-01-13 ENCOUNTER — Encounter: Payer: Self-pay | Admitting: Nurse Practitioner

## 2019-01-13 VITALS — BP 122/58 | HR 81 | Temp 97.3°F | Ht 59.0 in | Wt 233.0 lb

## 2019-01-13 DIAGNOSIS — R1032 Left lower quadrant pain: Secondary | ICD-10-CM | POA: Diagnosis not present

## 2019-01-13 DIAGNOSIS — R197 Diarrhea, unspecified: Secondary | ICD-10-CM

## 2019-01-13 DIAGNOSIS — K5730 Diverticulosis of large intestine without perforation or abscess without bleeding: Secondary | ICD-10-CM | POA: Diagnosis not present

## 2019-01-13 NOTE — Progress Notes (Signed)
CC'D TO PCP °

## 2019-01-13 NOTE — Assessment & Plan Note (Signed)
Diarrhea started about 1 week ago.  She is not had any diarrhea in 2 days.  She has been eating a brat diet and taking 1/2-1 Imodium daily.  Her stools have become more normal.  I feel she likely had a brief viral gastroenteritis.  Less likely diverticulitis flare.  I recommended continue brat diet and advance as tolerated, use Imodium as needed, continue Bentyl, start probiotics for 1 to 2 months.  Follow-up in 6 to 8 weeks.

## 2019-01-13 NOTE — Assessment & Plan Note (Signed)
History of diverticulosis.  The patient was concerned she may be having a flare of diverticulitis.  However, her symptoms have significantly improved in the past week.  She does still have some abdominal discomfort.  I feel overall there is no justification for CT exam or antibiotics.  We will treat her with supportive measures for likely viral gastroenteritis.  She is to call us with any worsening symptoms and we can consider further imaging or antibiotics at that time.  Follow-up in 6 to 8 weeks otherwise.

## 2019-01-13 NOTE — Progress Notes (Signed)
Referring Provider: Asencion Noble, MD Primary Care Physician:  Asencion Noble, MD Primary GI:  Dr. Gala Romney  Chief Complaint  Patient presents with  . Diarrhea    Has not had any in 2 days. Started last week up to 7 times before it starts clearing    HPI:   Ann Fuller is a 73 y.o. female who presents for diarrhea.  The patient was last seen in our office 07/08/2018 for dysphagia and GERD.  At that time it was noted she has numerous medical problems.  On Protonix twice a day with some breakthrough symptoms which may be associated to a significant degree with her reactive airway disease.  It was felt her morbid obesity was predisposing her to reflux.  No typical symptoms of gastroparesis.  History of colon polyps, due for surveillance colonoscopy in 2020.  Recommended EGD with possible dilation on MAC.  Further recommendations to follow procedure.  Significant weight loss would help as well and it was noted the patient is going to the weight loss clinic at [facility] in the near future.  The patient canceled her EGD because of current admission to [facility] with heart failure.  She indicated she would reschedule when she was recovered.  However, no further communication from the patient since that time.  Today she states she's doing ok overall. Began having diarrhea last Monday. She started a BRAT diet and Imodium which seems to have helped. No diarrhea in 2 days. Recent medication changes: added Lasix bid and stopped ASA. Has a history of diverticulitis which she states in her causes frequent diarrhea. She also had abdominal pain. States milk and nuts irritates her diverticulitis. Denies hematochezia. Has black tarry stools, last week was most recent was last week. No iron or pepto. Still with left-sided abdominal pain but much improved. When she takes a pill she can feel it move down her left side. Denies fever, chills, N/V. Last episode of diarrhea was 2 days ago. Denies chest pain,  dyspnea, dizziness, lightheadedness, syncope, near syncope. Denies any other upper or lower GI symptoms.  Denies recent antibiotics or dietary changes. She is on oxygen due to recent pneumonia.  Past Medical History:  Diagnosis Date  . Adenomatous colon polyp   . Arthritis   . Asthma   . Breast cancer ([place]) [id number]  . Bulging lumbar disc   . CHF (congestive heart failure) ([place])   . Depression   . Diverticulitis   . Dyspepsia   . Essential hypertension, benign   . GERD (gastroesophageal reflux disease)   . Heart murmur   . Hyperlipidemia   . IBS (irritable bowel syndrome)   . Internal hemorrhoid   . Right shoulder pain   . Tubular adenoma 11/2010  . Type 2 diabetes mellitus ([place])     Past Surgical History:  Procedure Laterality Date  . ABDOMINAL HYSTERECTOMY    . APPENDECTOMY    . BACK SURGERY  2007   Lumbar fusion  . CATARACT EXTRACTION W/PHACO Left 08/27/2013   Procedure: CATARACT EXTRACTION PHACO AND INTRAOCULAR LENS PLACEMENT (IOC);  Surgeon: Tonny Branch, MD;  Location: [facility];  Service: Ophthalmology;  Laterality: Left;  CDE 4.25  . CATARACT EXTRACTION W/PHACO Right 09/24/2013   Procedure: CATARACT EXTRACTION PHACO AND INTRAOCULAR LENS PLACEMENT (IOC);  Surgeon: Tonny Branch, MD;  Location: [facility];  Service: Ophthalmology;  Laterality: Right;  CDE:  8.16  . CHOLECYSTECTOMY    . COLONOSCOPY  12/13/2010   Dr. Margarito Courser pancolonic diverticular.tubular  adenoma  . COLONOSCOPY  07/13/2003  . COLONOSCOPY N/A 10/28/2015   IDP:OEUMPNT diverticulosis, multiple tubular adenomas removed. next tcs 10/2020.   . ESOPHAGOGASTRODUODENOSCOPY  12/13/2010   Dr. Jennet Maduro hernia, fundal gland type polyps  . EYE SURGERY Left    Left KPE 08/27/13  . MASTECTOMY Left 1995  . NASAL ENDOSCOPY WITH EPISTAXIS CONTROL Left 07/16/2016   Procedure: ENDOSCOPIC LEFT NASAL CAUTERY;  Surgeon: Leta Baptist, MD;  Location: [place];  Service: ENT;  Laterality: Left;  . TONSILLECTOMY    .  TUBAL LIGATION      Current Outpatient Medications  Medication Sig Dispense Refill  . acetaminophen (TYLENOL) 650 MG CR tablet Take 650 mg by mouth every 8 (eight) hours as needed for pain.    Marland Kitchen albuterol (PROVENTIL HFA;VENTOLIN HFA) 108 (90 Base) MCG/ACT inhaler Inhale 2 puffs into the lungs every 4 (four) hours as needed for wheezing or shortness of breath.    Marland Kitchen amLODipine (NORVASC) 5 MG tablet Take 5 mg by mouth daily.    Marland Kitchen atorvastatin (LIPITOR) 40 MG tablet Take 40 mg by mouth at bedtime.     . baclofen (LIORESAL) 10 MG tablet Take 5 mg by mouth 3 (three) times daily as needed for muscle spasms.     . Benralizumab (FASENRA) 30 MG/ML SOSY Inject 30 mg into the skin every 8 (eight) weeks.     . budesonide-formoterol (SYMBICORT) 80-4.5 MCG/ACT inhaler Inhale 2 puffs into the lungs 2 (two) times daily. 1 Inhaler 0  . carvedilol (COREG) 3.125 MG tablet Take 1 tablet (3.125 mg total) by mouth 2 (two) times daily with a meal. 60 tablet 1  . dicyclomine (BENTYL) 10 MG capsule TAKE 1 CAPSULE BY MOUTH PRIOR TO MEALS AS NEEDED .Marland KitchenMarland KitchenNO MORE THAN 4 TIMES DAILY MAY CAUSE DROWSINESS (Patient taking differently: Take 10 mg by mouth 3 (three) times daily before meals. ) 120 capsule 3  . doxazosin (CARDURA) 4 MG tablet Take 4 mg by mouth daily.    Marland Kitchen EPINEPHrine (EPIPEN 2-PAK) 0.3 mg/0.3 mL IJ SOAJ injection Inject 0.3 mg into the muscle daily as needed (allergic reaction).    Marland Kitchen exenatide (BYETTA 10 MCG PEN) 10 MCG/0.04ML SOLN Inject 10 mcg into the skin 2 (two) times daily with a meal.     . fluticasone (FLONASE) 50 MCG/ACT nasal spray Place 1 spray into both nostrils daily as needed for allergies or rhinitis.    . furosemide (LASIX) 40 MG tablet 2 (two) times daily.     Marland Kitchen HUMALOG KWIKPEN 100 UNIT/ML KiwkPen     . Insulin Glargine (LANTUS) 100 UNIT/ML Solostar Pen Inject 26 Units into the skin every morning.     . Insulin Lispro (HUMALOG KWIKPEN [place]) Inject 6 Units into the skin 2 (two) times daily before a  meal.     . ipratropium-albuterol (DUONEB) 0.5-2.5 (3) MG/3ML SOLN Take 3 mLs by nebulization every 4 (four) hours. (Patient taking differently: Take 3 mLs by nebulization every 6 (six) hours as needed (wheezing and shortness of breath). ) 360 mL 6  . LORazepam (ATIVAN) 1 MG tablet Take 1 mg by mouth 2 (two) times daily as needed for anxiety or sleep.     . magnesium oxide (MAG-OX) 400 MG tablet Take 400 mg by mouth daily.    . mometasone-formoterol (DULERA) 100-5 MCG/ACT AERO Inhale 2 puffs into the lungs 2 (two) times daily.    . montelukast (SINGULAIR) 10 MG tablet TAKE ONE TABLET BY MOUTH EVERY NIGHT AT BEDTIME (  Patient taking differently: Take 10 mg by mouth at bedtime. ) 30 tablet 5  . olmesartan (BENICAR) 40 MG tablet Take 40 mg by mouth daily.     . pantoprazole (PROTONIX) 40 MG tablet TAKE ONE TABLET BY MOUTH TWICE A DAY BEFORE A MEAL 180 tablet 1  . sertraline (ZOLOFT) 50 MG tablet Take 50 mg by mouth daily.     Marland Kitchen Spacer/Aero-Holding Chambers (AEROCHAMBER MV) inhaler Use as instructed 1 each 0  . SPIRIVA HANDIHALER 18 MCG inhalation capsule     . temazepam (RESTORIL) 15 MG capsule Take 15 mg by mouth at bedtime as needed for sleep.    . vitamin C (ASCORBIC ACID) 500 MG tablet Take 500 mg by mouth daily.      No current facility-administered medications for this visit.     Allergies as of 01/13/2019 - Review Complete 01/13/2019  Allergen Reaction Noted  . Codeine Swelling   . Ceftin [cefuroxime axetil] Swelling 05/02/2012  . Metformin and related Diarrhea 11/26/2012  . Nexium [esomeprazole magnesium] Diarrhea 05/02/2012  . Omnicef [cefdinir] Diarrhea 05/02/2012  . Sulfa antibiotics Rash 05/02/2012    Family History  Problem Relation Age of Onset  . Colon cancer Mother   . Heart attack Father   . Heart attack Sister   . Stroke Sister   . Cancer - Lung Sister     Social History   Socioeconomic History  . Marital status: Married    Spouse name: Not on file  . Number of  children: Not on file  . Years of education: Not on file  . Highest education level: Not on file  Occupational History  . Not on file  Social Needs  . Financial resource strain: Not on file  . Food insecurity:    Worry: Not on file    Inability: Not on file  . Transportation needs:    Medical: Not on file    Non-medical: Not on file  Tobacco Use  . Smoking status: Never Smoker  . Smokeless tobacco: Never Used  Substance and Sexual Activity  . Alcohol use: No  . Drug use: No  . Sexual activity: Yes    Birth control/protection: Surgical  Lifestyle  . Physical activity:    Days per week: Not on file    Minutes per session: Not on file  . Stress: Not on file  Relationships  . Social connections:    Talks on phone: Not on file    Gets together: Not on file    Attends religious service: Not on file    Active member of club or organization: Not on file    Attends meetings of clubs or organizations: Not on file    Relationship status: Not on file  Other Topics Concern  . Not on file  Social History Narrative  . Not on file    Review of Systems: General: Negative for anorexia, weight loss, fever, chills, fatigue, weakness. Eyes: Complains of morning crusting left eye x 1-2 days, persistent drainage.  ENT: Negative for hoarseness, difficulty swallowing. CV: Negative for chest pain, angina, palpitations, peripheral edema.  Respiratory: Negative for dyspnea at rest, cough, sputum, wheezing.  GI: See history of present illness. Endo: Negative for unusual weight change.  Heme: Negative for bruising or bleeding. Allergy: Negative for rash or hives.   Physical Exam: BP (!) 122/58   Pulse 81   Temp (!) 97.3 F (36.3 C) (Oral)   Ht _0  (1.499 m)   Wt 233 lb (  105.7 kg)   BMI 47.06 kg/m  General:   Alert and oriented. Pleasant and cooperative. Well-nourished and well-developed.  Eyes:  Left scleral injection and prominence of superficial vasculature; persistent drainage.    Ears:  Normal auditory acuity. Cardiovascular:  S1, S2 present without murmurs appreciated. Extremities without clubbing or edema. Respiratory:  Clear to auscultation bilaterally. No wheezes, rales, or rhonchi. No distress.  Gastrointestinal:  +BS, soft, non-tender and non-distended. No HSM noted. No guarding or rebound. No masses appreciated.  Rectal:  Deferred  Musculoskalatal:  Symmetrical without gross deformities. Skin:  Intact without significant lesions or rashes. Neurologic:  Alert and oriented x4;  grossly normal neurologically. Psych:  Alert and cooperative. Normal mood and affect. Heme/Lymph/Immune: No excessive bruising noted.    01/13/2019 11:15 AM   Disclaimer: This note was dictated with voice recognition software. Similar sounding words can inadvertently be transcribed and may not be corrected upon review.

## 2019-01-13 NOTE — Assessment & Plan Note (Signed)
She seems to have chronic left-sided abdominal pain.  She does have a history of diverticulosis and possible flares of diverticulitis.  Most recent CT imaging in 2018 with concerns for diverticulitis showed diverticula but no acute diverticulitis.  Given her symptoms, as stated above.  Less likely diverticulitis and more likely a viral gastroenteritis.  We will treat conservatively as per above.  Further recommendations to follow for any worsening.  Follow-up in 6 to 8 weeks.

## 2019-01-13 NOTE — Patient Instructions (Signed)
I am sorry to hear about your experience this weekend.  Hopefully today's visit was more in line with your expectations.  Your health issues we discussed today were:   Abdominal pain and diarrhea: 1. As we discussed, it seems like this is improving. 2. I feel you likely had a viral illness.  Less likely, you may have had a mild flare of diverticulitis 3. I do not feel it is necessary to have a CT scan done today. 4. Start probiotics for 1 to 2 months which she can obtain over-the-counter at the pharmacy 5. Unfortunately we do not have probiotic samples available 6. Continue Imodium as needed 7. Call us for any significant worsening symptoms  Overall I recommend:  1. Follow-up in 6 to 8 weeks 2. Call us if you have any questions or concerns.  At [facility] we value your feedback. You may receive a survey about your visit today. Please share your experience as we strive to create trusting relationships with our patients to provide genuine, compassionate, quality care.  We appreciate your understanding and patience as we review any laboratory studies, imaging, and other diagnostic tests that are ordered as we care for you. Our office policy is 5 business days for review of these results, and any emergent or urgent results are addressed in a timely manner for your best interest. If you do not hear from our office in 1 week, please contact us.   We also encourage the use of MyChart, which contains your medical information for your review as well. If you are not enrolled in this feature, an access code is on this after visit summary for your convenience. Thank you for allowing [place] to be involved in your care.  It was great to see you today!  I hope you have a great day!!

## 2019-01-19 ENCOUNTER — Other Ambulatory Visit: Payer: Self-pay | Admitting: Gastroenterology

## 2019-02-18 ENCOUNTER — Other Ambulatory Visit: Payer: Self-pay | Admitting: Gastroenterology

## 2019-02-21 IMAGING — CT CT CHEST W/O CM
2 of 3 series · 15 of 36 positions shown, 18 images · non-contrast
Comparison: Chest x-ray 11/17/2017.  Chest CT 05/30/2015

CLINICAL DATA: Shortness of breath. Recent pneumonia. Remote
history of breast cancer and left mastectomy.

EXAM:
CT CHEST WITHOUT CONTRAST
TECHNIQUE: Multidetector CT imaging of the chest was performed following the
standard protocol without IV contrast.

[Series 2: thorax · axial · 0.54mm/px · z∈[-212,+6]mm · 12 of 129 slices shown, 15 images]
[im 10/129  mediastinal]
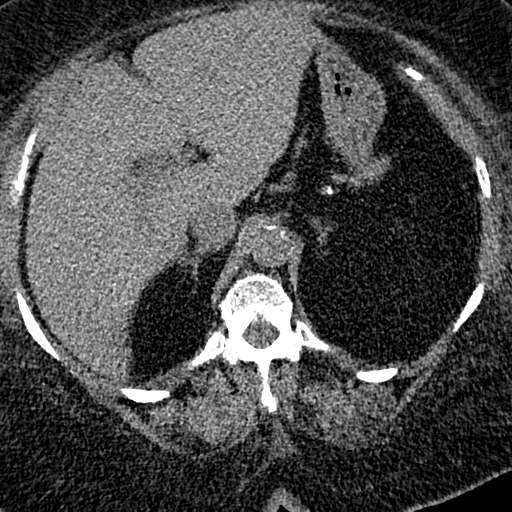
[im 10/129  lung]
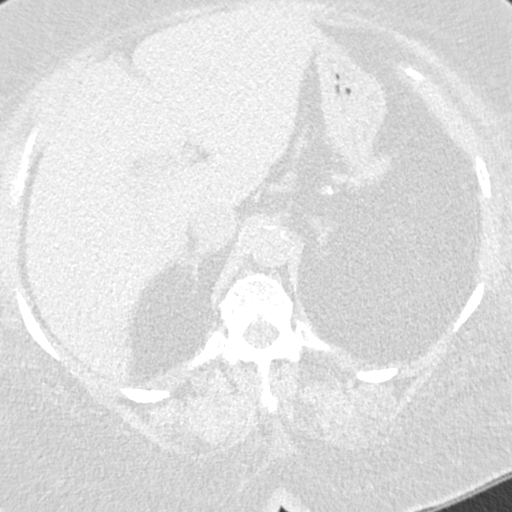
[im 19/129  lung]
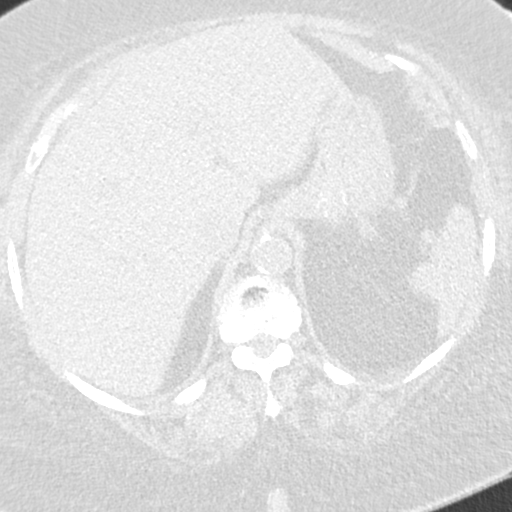
[im 29/129  lung]
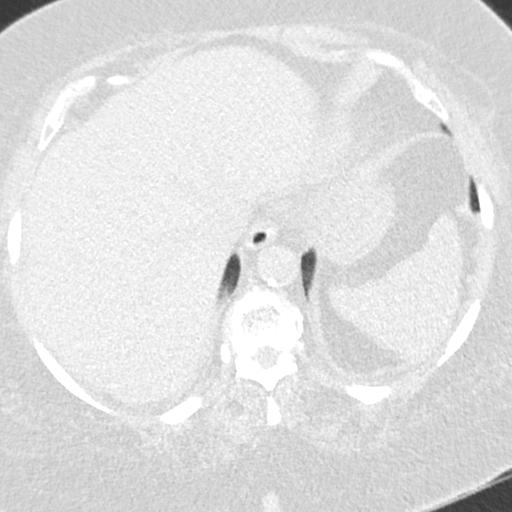
[im 38/129  lung]
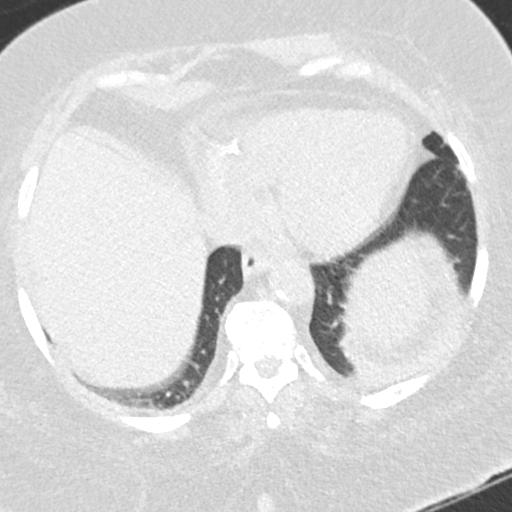
[im 48/129  mediastinal]
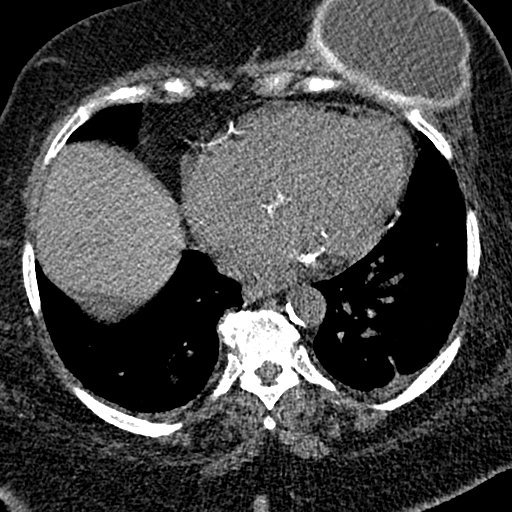
[im 48/129  lung]
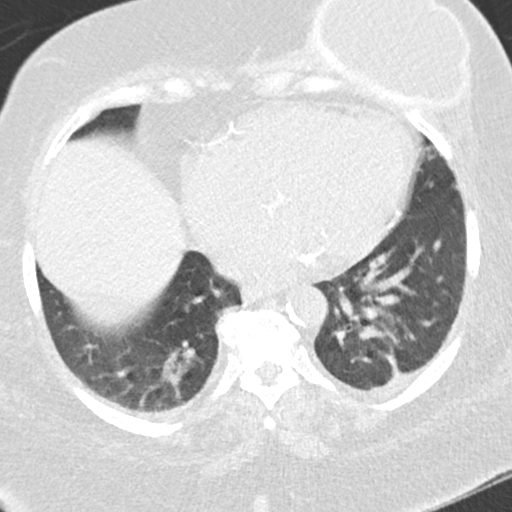
[im 57/129  lung]
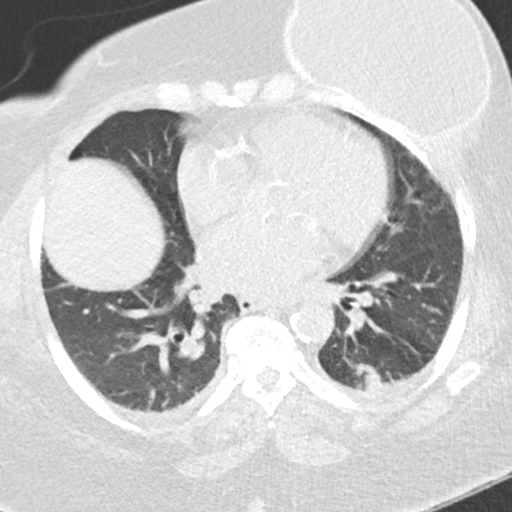
[im 72/129  lung]
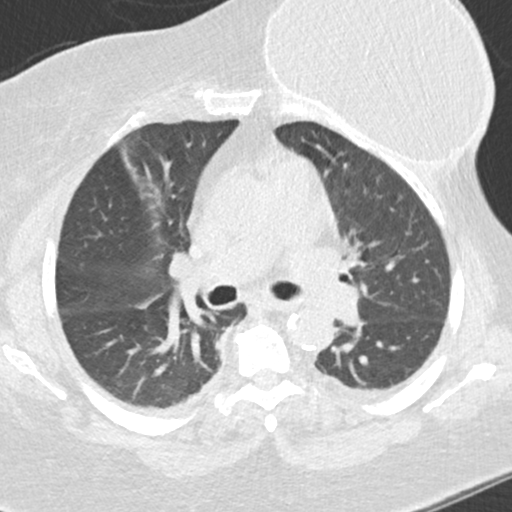
[im 81/129  lung]
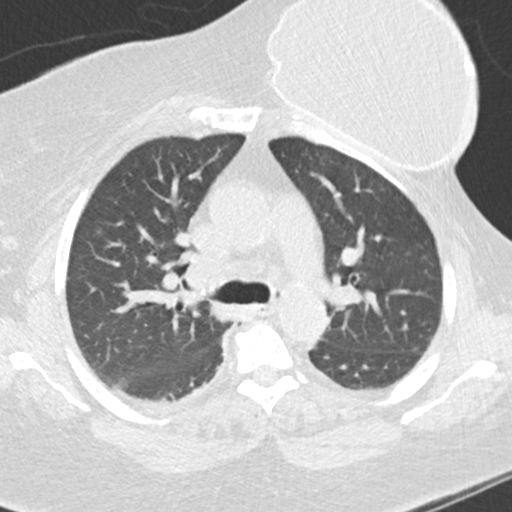
[im 91/129  mediastinal]
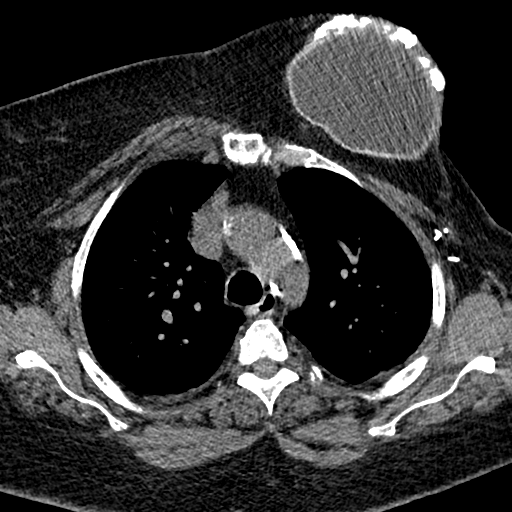
[im 91/129  lung]
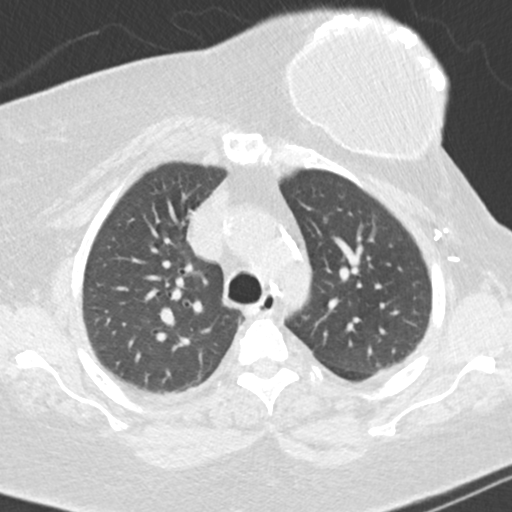
[im 100/129  lung]
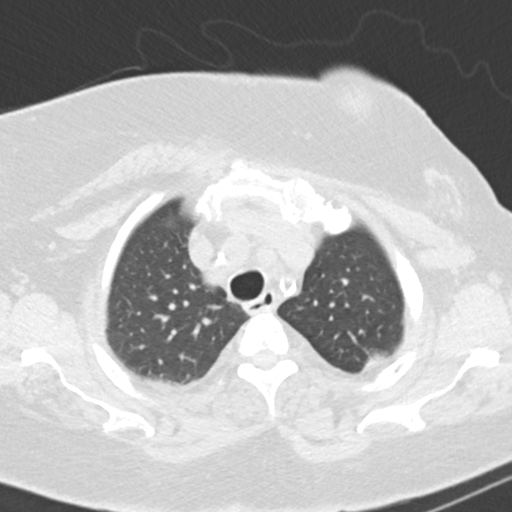
[im 110/129  lung]
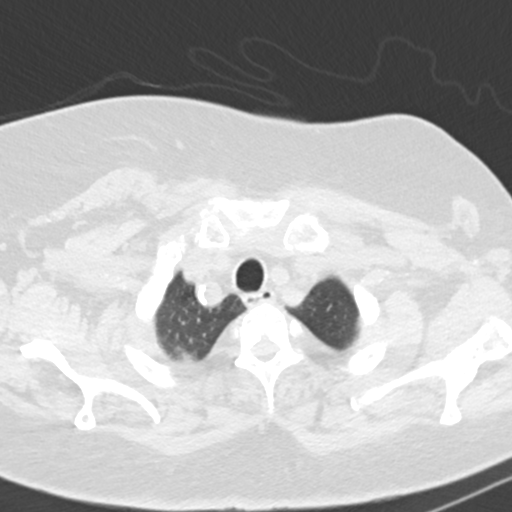
[im 119/129  lung]
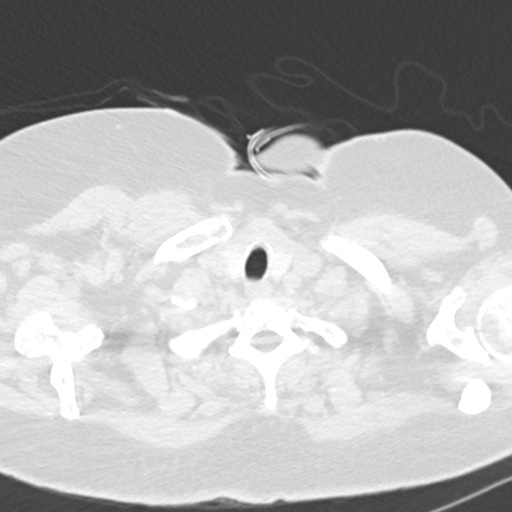

[Series 5: coronal · coronal · 0.52mm/px · 3 of 151 slices shown]
[im 31/151  lung]
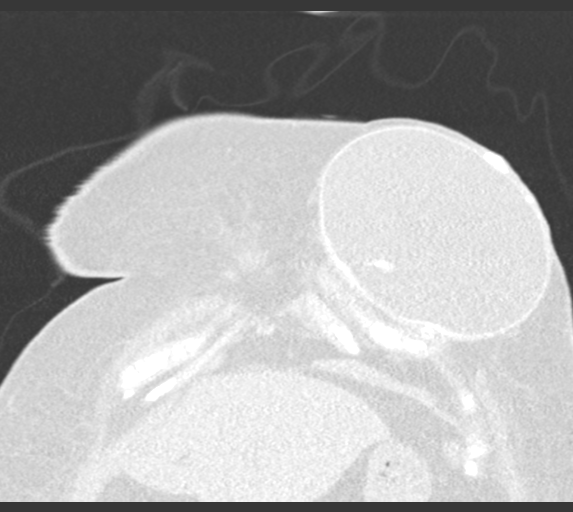
[im 61/151  lung]
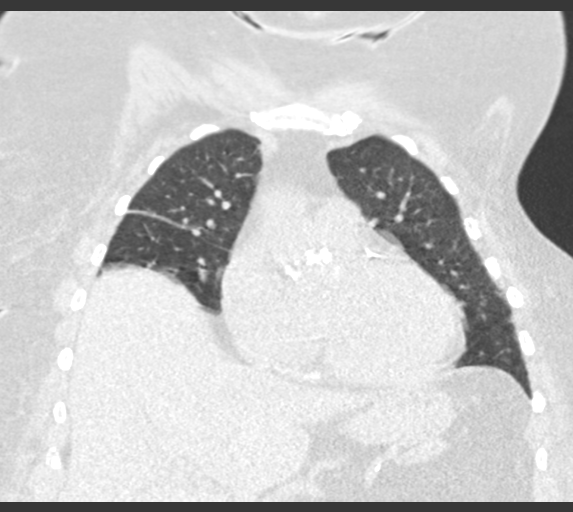
[im 91/151  lung]
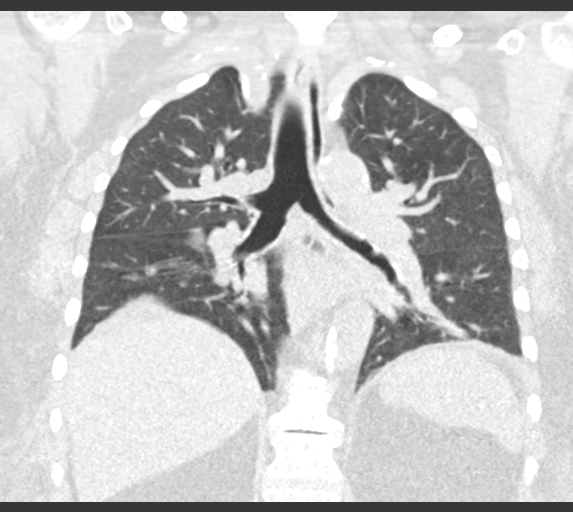

[15 of 36 positions shown; findings below may reference images not displayed]

FINDINGS: Cardiovascular: Diffuse extensive coronary artery calcifications.
Mitral valve and aortic valve calcifications. Diffuse aortic arch
and descending thoracic aortic calcifications. No aneurysm. Heart is
borderline in size.

Mediastinum/Nodes: No mediastinal, hilar, or axillary adenopathy.

Lungs/Pleura: Previously seen bilateral airspace opacities appear to
have improved. Residual bibasilar opacities, left slightly greater
than right could reflect atelectasis or resolving pneumonia. No
effusions.

Upper Abdomen: Imaging into the upper abdomen shows no acute
findings.

Musculoskeletal: Prior left mastectomy and left axillary nodal
dissection. Left breast implants noted. No acute bony abnormality or
focal bone lesion.
IMPRESSION: Improving aeration of the lungs since prior chest x-ray, with
residual bibasilar atelectasis or resolving infiltrates, left
slightly greater than right.

Cardiomegaly.  Coronary artery disease.

Aortic Atherosclerosis ([id number]).

## 2019-03-11 ENCOUNTER — Other Ambulatory Visit: Payer: Self-pay

## 2019-03-11 ENCOUNTER — Encounter: Payer: Self-pay | Admitting: Obstetrics and Gynecology

## 2019-03-11 ENCOUNTER — Ambulatory Visit: Payer: Medicare Other | Admitting: Obstetrics and Gynecology

## 2019-03-11 ENCOUNTER — Inpatient Hospital Stay (HOSPITAL_COMMUNITY): Payer: Medicare Other | Attending: Hematology

## 2019-03-11 DIAGNOSIS — R5383 Other fatigue: Secondary | ICD-10-CM | POA: Diagnosis not present

## 2019-03-11 DIAGNOSIS — D649 Anemia, unspecified: Secondary | ICD-10-CM | POA: Insufficient documentation

## 2019-03-11 DIAGNOSIS — Z9221 Personal history of antineoplastic chemotherapy: Secondary | ICD-10-CM | POA: Diagnosis not present

## 2019-03-11 DIAGNOSIS — Z853 Personal history of malignant neoplasm of breast: Secondary | ICD-10-CM | POA: Insufficient documentation

## 2019-03-11 DIAGNOSIS — Z9223 Personal history of estrogen therapy: Secondary | ICD-10-CM | POA: Insufficient documentation

## 2019-03-11 DIAGNOSIS — Z794 Long term (current) use of insulin: Secondary | ICD-10-CM | POA: Insufficient documentation

## 2019-03-11 DIAGNOSIS — M1990 Unspecified osteoarthritis, unspecified site: Secondary | ICD-10-CM | POA: Diagnosis not present

## 2019-03-11 DIAGNOSIS — E119 Type 2 diabetes mellitus without complications: Secondary | ICD-10-CM | POA: Diagnosis not present

## 2019-03-11 DIAGNOSIS — I509 Heart failure, unspecified: Secondary | ICD-10-CM | POA: Diagnosis not present

## 2019-03-11 DIAGNOSIS — E785 Hyperlipidemia, unspecified: Secondary | ICD-10-CM | POA: Diagnosis not present

## 2019-03-11 DIAGNOSIS — I110 Hypertensive heart disease with heart failure: Secondary | ICD-10-CM | POA: Diagnosis not present

## 2019-03-11 DIAGNOSIS — Z79899 Other long term (current) drug therapy: Secondary | ICD-10-CM | POA: Diagnosis not present

## 2019-03-11 DIAGNOSIS — R0600 Dyspnea, unspecified: Secondary | ICD-10-CM | POA: Diagnosis not present

## 2019-03-11 DIAGNOSIS — E538 Deficiency of other specified B group vitamins: Secondary | ICD-10-CM | POA: Insufficient documentation

## 2019-03-11 DIAGNOSIS — K219 Gastro-esophageal reflux disease without esophagitis: Secondary | ICD-10-CM | POA: Diagnosis not present

## 2019-03-11 DIAGNOSIS — Z923 Personal history of irradiation: Secondary | ICD-10-CM | POA: Insufficient documentation

## 2019-03-11 DIAGNOSIS — N8111 Cystocele, midline: Secondary | ICD-10-CM | POA: Diagnosis not present

## 2019-03-11 LAB — COMPREHENSIVE METABOLIC PANEL
ALT: 12 U/L (ref 0–44)
AST: 16 U/L (ref 15–41)
Albumin: 3.7 g/dL (ref 3.5–5.0)
Alkaline Phosphatase: 53 U/L (ref 38–126)
Anion gap: 9 (ref 5–15)
BUN: 21 mg/dL (ref 8–23)
CO2: 30 mmol/L (ref 22–32)
Calcium: 9.1 mg/dL (ref 8.9–10.3)
Chloride: 98 mmol/L (ref 98–111)
Creatinine, Ser: 0.91 mg/dL (ref 0.44–1.00)
GFR calc Af Amer: >60 mL/min (ref >60)
GFR calc non Af Amer: >60 mL/min (ref >60)
Glucose, Bld: 117 mg/dL — ABNORMAL HIGH (ref 70–99)
Potassium: 3.4 mmol/L — ABNORMAL LOW (ref 3.5–5.1)
Sodium: 137 mmol/L (ref 135–145)
Total Bilirubin: 0.4 mg/dL (ref 0.3–1.2)
Total Protein: 7.7 g/dL (ref 6.5–8.1)

## 2019-03-11 LAB — CBC WITH DIFFERENTIAL/PLATELET
Abs Immature Granulocytes: 0.03 10*3/uL (ref 0.00–0.07)
Basophils Absolute: 0 10*3/uL (ref 0.0–0.1)
Basophils Relative: 0 %
Eosinophils Absolute: 0 10*3/uL (ref 0.0–0.5)
Eosinophils Relative: 0 %
HCT: 30.9 % — ABNORMAL LOW (ref 36.0–46.0)
Hemoglobin: 9.6 g/dL — ABNORMAL LOW (ref 12.0–15.0)
Immature Granulocytes: 0 %
Lymphocytes Relative: 14 %
Lymphs Abs: 1.2 10*3/uL (ref 0.7–4.0)
MCH: 30.3 pg (ref 26.0–34.0)
MCHC: 31.1 g/dL (ref 30.0–36.0)
MCV: 97.5 fL (ref 80.0–100.0)
Monocytes Absolute: 0.7 10*3/uL (ref 0.1–1.0)
Monocytes Relative: 8 %
Neutro Abs: 6.9 10*3/uL (ref 1.7–7.7)
Neutrophils Relative %: 78 %
Platelets: 249 10*3/uL (ref 150–400)
RBC: 3.17 MIL/uL — ABNORMAL LOW (ref 3.87–5.11)
RDW: 13.2 % (ref 11.5–15.5)
WBC: 8.9 10*3/uL (ref 4.0–10.5)
nRBC: 0 % (ref 0.0–0.2)

## 2019-03-11 LAB — LACTATE DEHYDROGENASE: LDH: 150 U/L (ref 98–192)

## 2019-03-11 LAB — FOLATE: Folate: 7.3 ng/mL (ref >5.9)

## 2019-03-11 LAB — VITAMIN B12: Vitamin B-12: 816 pg/mL (ref 180–914)

## 2019-03-11 LAB — FERRITIN: Ferritin: 245 ng/mL (ref 11–307)

## 2019-03-11 LAB — IRON AND TIBC
IRON: 53 ug/dL (ref 28–170)
Saturation Ratios: 18 % (ref 10.4–31.8)
TIBC: 293 ug/dL (ref 250–450)
UIBC: 240 ug/dL

## 2019-03-11 NOTE — Patient Instructions (Signed)

## 2019-03-11 NOTE — Progress Notes (Signed)
Patient ID: Ann Fuller, female   DOB: Dec 06, 1946, 73 y.o.   MRN: [id number] Ms Threat presents with c/o feeling a "bulge" in her vagina for the last well. She thinks has smaller since first noticed. Noticed after an episode of severe constipation last week. She normally does not have problems with constipation. She does report episodes of stress IC at times She is not sexual active H/O TVH in the early 1980's Multiple medical problems as noted in chart.  PE AF VSS Lungs clear  Heart RRR Abd soft + BS  GU Nl EGBUS vaginal mucosa slightly atrophic, cystocele to hymenal ring with valsalva in the supine position, cervix and uterus surgerical absent, levator muscles attenuated bilaterally, bladder non tender, able to reduce cystocele without problems, no enterocele appreciated, small rectocele perennial body @ 1-2 cm, + Q tip test, no loss of urine with deep cough or valsalva  A/P Pelvic relaxation mainly cystocele.  Dx reviewed with pt. Due not feel pt is a surgerical candidate d/t to chronic medical problems. Pessary use reviewed with pt.  She declines at present. Information on cystocele/pessary provided to pt. She will f/u PRN

## 2019-03-17 ENCOUNTER — Ambulatory Visit: Payer: Medicare Other | Admitting: Nurse Practitioner

## 2019-03-18 ENCOUNTER — Inpatient Hospital Stay (HOSPITAL_BASED_OUTPATIENT_CLINIC_OR_DEPARTMENT_OTHER): Payer: Medicare Other | Admitting: Hematology

## 2019-03-18 ENCOUNTER — Other Ambulatory Visit: Payer: Self-pay

## 2019-03-18 DIAGNOSIS — Z853 Personal history of malignant neoplasm of breast: Secondary | ICD-10-CM | POA: Diagnosis not present

## 2019-03-18 DIAGNOSIS — E538 Deficiency of other specified B group vitamins: Secondary | ICD-10-CM | POA: Diagnosis not present

## 2019-03-18 DIAGNOSIS — D649 Anemia, unspecified: Secondary | ICD-10-CM

## 2019-03-18 MED ORDER — SODIUM CHLORIDE 0.9 % IV SOLN: 510.0000 mg | INTRAVENOUS | Status: DC

## 2019-03-18 NOTE — Patient Instructions (Addendum)
[place] at [facility] Discharge Instructions  You had a telephone visit today with Dr. Delton Coombes.  He discussed your lab results with you.  Your hemoglobin is lower which could have been causing you to feel bad.  He said that your liver and kidney functions are okay.  He said that your iron and ferritin levels are normal but your iron saturation numbers are low. We like for yours to be around 25-30.  Yours is 18 and he recommended for you to have iron infusion.  It would help increase your hemoglobin and help you to feel better.  He discussed with you about Feraheme which is the same iron infusion you had in the past.  We will call you with your appointments for the iron infusions. He talked with you about how your hemoglobin is what transports oxygen to your body and when it is low it can cause you to be short of breath.  We will have you follow up with [place] in 2 months with lab work to see how your counts are doing.    Thank you for choosing [place] at [facility] to provide your oncology and hematology care.  To afford each patient quality time with our provider, please arrive at least 15 minutes before your scheduled appointment time.   If you have a lab appointment with the [place] please come in thru the  Main Entrance and check in at the main information desk  You need to re-schedule your appointment should you arrive 10 or more minutes late.  We strive to give you quality time with our providers, and arriving late affects you and other patients whose appointments are after yours.  Also, if you no show three or more times for appointments you may be dismissed from the clinic at the providers discretion.     Again, thank you for choosing [facility].  Our hope is that these requests will decrease the amount of time that you wait before being seen by our physicians.        _____________________________________________________________  Should you have questions after your visit to [facility], please contact our office at [phone] between the hours of 8:00 a.m. and 4:30 p.m.  Voicemails left after 4:00 p.m. will not be returned until the following business day.  For prescription refill requests, have your pharmacy contact our office and allow 72 hours.    Cancer Center Support Programs:   > Cancer Support Group  2nd Tuesday of the month 1pm-2pm, Journey Room

## 2019-03-18 NOTE — Assessment & Plan Note (Signed)
1.  Normocytic anemia: -Differential diagnosis includes CKD and relative iron deficiency state. - recent CBC on 09/03/2018 showed hemoglobin of 8.3. - CBC repeated by Dr. Willey Blade on 09/11/2018 shows hemoglobin of 9.3, MCV of 86 with normal white count and platelet count. - Serum ferritin was 92, percent saturation was 15 with a TIBC of 336.  Vitamin T25 was 498 and folic acid was 26.4.  SPEP was negative. -Last colonoscopy was on 10/28/2015 which showed diverticulosis and tubular adenoma.  Recent stool for occult blood was also found to be negative by Dr. Willey Blade.  Patient denies any bleeding per rectum or melena. - Last Feraheme infusion was on 10/07/2018 and 10/14/2018. -Today she denied any bleeding per rectum or melena over the last several weeks.  However she complains of decreased energy and shortness of breath on exertion.  Denies any fevers, night sweats or weight loss. - I have reviewed her blood work over the phone.  Hemoglobin has decreased to 9.6.  Ferritin was 240.  Percent saturation was low at 18.  I have recommended 2 more infusions of Feraheme to increase the percent saturation close to 30.  This will likely improve her hemoglobin as well as energy levels.  She is agreeable to this. -I will see her back in 2 months for follow-up.  2.  History of left breast cancer: - Was initially diagnosed with left breast cancer in 1990, status post lumpectomy and lymph node dissection.  She also received chemotherapy followed by radiation followed by 5 years of tamoxifen. - Had a recurrence of breast cancer in the left breast in 1995 and underwent mastectomy, breast implant  followed by chemotherapy.  3.  B12 deficiency: -She is continuing B12 1 mg tablet daily.  Her B12 level was within normal limits.

## 2019-03-18 NOTE — Progress Notes (Addendum)
Ann Fuller, [place] [address]   CLINIC:  Medical Oncology/Hematology  PCP:  Asencion Noble, [place]  [phone]   REASON FOR VISIT:  Telephone visit for normocytic anemia.     INTERVAL HISTORY:  Ann Fuller 73 y.o. female returns for routine follow-up. She was evaluated today via telephone visit.  She does report some fatigue and dyspnea on exertion.  Denies any nausea, vomiting, or diarrhea. Denies any new pains. Had not noticed any recent bleeding such as epistaxis, hematuria or hematochezia. Denies recent chest pain on exertion, pre-syncopal episodes, or palpitations. Denies any numbness or tingling in hands or feet. Denies any recent fevers, infections, or recent hospitalizations.   REVIEW OF SYSTEMS:  Review of Systems  Constitutional: Positive for fatigue.  All other systems reviewed and are negative.    PAST MEDICAL/SURGICAL HISTORY:  Past Medical History:  Diagnosis Date  . Adenomatous colon polyp   . Arthritis   . Asthma   . Breast cancer ([place]) [id number]  . Bulging lumbar disc   . CHF (congestive heart failure) ([place])   . Depression   . Diverticulitis   . Dyspepsia   . Essential hypertension, benign   . GERD (gastroesophageal reflux disease)   . Heart murmur   . Hyperlipidemia   . IBS (irritable bowel syndrome)   . Internal hemorrhoid   . Right shoulder pain   . Tubular adenoma 11/2010  . Type 2 diabetes mellitus ([place])    Past Surgical History:  Procedure Laterality Date  . ABDOMINAL HYSTERECTOMY    . APPENDECTOMY    . BACK SURGERY  2007   Lumbar fusion  . CATARACT EXTRACTION W/PHACO Left 08/27/2013   Procedure: CATARACT EXTRACTION PHACO AND INTRAOCULAR LENS PLACEMENT (IOC);  Surgeon: Tonny Branch, MD;  Location: [facility];  Service: Ophthalmology;  Laterality: Left;  CDE 4.25  . CATARACT EXTRACTION W/PHACO Right 09/24/2013   Procedure: CATARACT EXTRACTION PHACO AND INTRAOCULAR LENS  PLACEMENT (IOC);  Surgeon: Tonny Branch, MD;  Location: [facility];  Service: Ophthalmology;  Laterality: Right;  CDE:  8.16  . CHOLECYSTECTOMY    . COLONOSCOPY  12/13/2010   Dr. Margarito Courser pancolonic diverticular.tubular adenoma  . COLONOSCOPY  07/13/2003  . COLONOSCOPY N/A 10/28/2015   KKX:FGHWEXH diverticulosis, multiple tubular adenomas removed. next tcs 10/2020.   . ESOPHAGOGASTRODUODENOSCOPY  12/13/2010   Dr. Jennet Maduro hernia, fundal gland type polyps  . EYE SURGERY Left    Left KPE 08/27/13  . MASTECTOMY Left 1995  . NASAL ENDOSCOPY WITH EPISTAXIS CONTROL Left 07/16/2016   Procedure: ENDOSCOPIC LEFT NASAL CAUTERY;  Surgeon: Leta Baptist, MD;  Location: [facility];  Service: ENT;  Laterality: Left;  . TONSILLECTOMY    . TUBAL LIGATION       SOCIAL HISTORY:  Social History   Socioeconomic History  . Marital status: Married    Spouse name: Not on file  . Number of children: Not on file  . Years of education: Not on file  . Highest education level: Not on file  Occupational History  . Not on file  Social Needs  . Financial resource strain: Not on file  . Food insecurity:    Worry: Not on file    Inability: Not on file  . Transportation needs:    Medical: Not on file    Non-medical: Not on file  Tobacco Use  . Smoking status: Never Smoker  . Smokeless tobacco: Never Used  Substance and  Sexual Activity  . Alcohol use: No  . Drug use: No  . Sexual activity: Yes    Birth control/protection: Surgical  Lifestyle  . Physical activity:    Days per week: Not on file    Minutes per session: Not on file  . Stress: Not on file  Relationships  . Social connections:    Talks on phone: Not on file    Gets together: Not on file    Attends religious service: Not on file    Active member of club or organization: Not on file    Attends meetings of clubs or organizations: Not on file    Relationship status: Not on file  . Intimate partner violence:    Fear of current  or ex partner: Not on file    Emotionally abused: Not on file    Physically abused: Not on file    Forced sexual activity: Not on file  Other Topics Concern  . Not on file  Social History Narrative  . Not on file    FAMILY HISTORY:  Family History  Problem Relation Age of Onset  . Colon cancer Mother   . Heart attack Father   . Heart attack Sister   . Stroke Sister   . Cancer - Lung Sister     CURRENT MEDICATIONS:  Outpatient Encounter Medications as of 03/18/2019  Medication Sig Note  . acetaminophen (TYLENOL) 650 MG CR tablet Take 650 mg by mouth every 8 (eight) hours as needed for pain.   Marland Kitchen albuterol (PROVENTIL HFA;VENTOLIN HFA) 108 (90 Base) MCG/ACT inhaler Inhale 2 puffs into the lungs every 4 (four) hours as needed for wheezing or shortness of breath.   Marland Kitchen amLODipine (NORVASC) 5 MG tablet Take 5 mg by mouth daily.   Marland Kitchen atorvastatin (LIPITOR) 40 MG tablet Take 40 mg by mouth at bedtime.    . baclofen (LIORESAL) 10 MG tablet Take 5 mg by mouth 3 (three) times daily as needed for muscle spasms.    . Benralizumab (FASENRA) 30 MG/ML SOSY Inject 30 mg into the skin every 8 (eight) weeks.    . budesonide-formoterol (SYMBICORT) 80-4.5 MCG/ACT inhaler Inhale 2 puffs into the lungs 2 (two) times daily.   . carvedilol (COREG) 3.125 MG tablet Take 1 tablet (3.125 mg total) by mouth 2 (two) times daily with a meal.   . dicyclomine (BENTYL) 10 MG capsule TAKE ONE CAPSULE BY MOUTH PRIOR TO MEALSAS NEEDED. NO MORE THAN 4 TIMES DAILY. MAY CAUSE DROWINESS.   Marland Kitchen doxazosin (CARDURA) 4 MG tablet Take 4 mg by mouth daily.   Marland Kitchen EPINEPHrine (EPIPEN 2-PAK) 0.3 mg/0.3 mL IJ SOAJ injection Inject 0.3 mg into the muscle daily as needed (allergic reaction).   Marland Kitchen exenatide (BYETTA 10 MCG PEN) 10 MCG/0.04ML SOLN Inject 10 mcg into the skin 2 (two) times daily with a meal.    . fluticasone (FLONASE) 50 MCG/ACT nasal spray Place 1 spray into both nostrils daily as needed for allergies or rhinitis.   . furosemide  (LASIX) 40 MG tablet 2 (two) times daily.    Marland Kitchen HUMALOG KWIKPEN 100 UNIT/ML KiwkPen    . Insulin Glargine (LANTUS) 100 UNIT/ML Solostar Pen Inject 26 Units into the skin every morning.    . Insulin Lispro (HUMALOG KWIKPEN [place]) Inject 6 Units into the skin 2 (two) times daily before a meal.    . ipratropium-albuterol (DUONEB) 0.5-2.5 (3) MG/3ML SOLN Take 3 mLs by nebulization every 4 (four) hours. (Patient taking differently: Take 3 mLs  by nebulization every 6 (six) hours as needed (wheezing and shortness of breath). )   . LORazepam (ATIVAN) 1 MG tablet Take 1 mg by mouth 2 (two) times daily as needed for anxiety or sleep.    . magnesium oxide (MAG-OX) 400 MG tablet Take 400 mg by mouth daily.   . mometasone-formoterol (DULERA) 100-5 MCG/ACT AERO Inhale 2 puffs into the lungs 2 (two) times daily.   . montelukast (SINGULAIR) 10 MG tablet TAKE ONE TABLET BY MOUTH EVERY NIGHT AT BEDTIME (Patient taking differently: Take 10 mg by mouth at bedtime. )   . olmesartan (BENICAR) 40 MG tablet Take 40 mg by mouth daily.    . pantoprazole (PROTONIX) 40 MG tablet TAKE ONE TABLET BY MOUTH TWICE A DAY BEFORE A MEAL   . sertraline (ZOLOFT) 50 MG tablet Take 50 mg by mouth daily.    Marland Kitchen Spacer/Aero-Holding Chambers (AEROCHAMBER MV) inhaler Use as instructed   . SPIRIVA HANDIHALER 18 MCG inhalation capsule    . temazepam (RESTORIL) 15 MG capsule Take 15 mg by mouth at bedtime as needed for sleep.   . vitamin C (ASCORBIC ACID) 500 MG tablet Take 500 mg by mouth daily.    . [DISCONTINUED] ferumoxytol (FERAHEME) 510 mg in sodium chloride 0.9 % 100 mL IVPB  03/18/2019: released too soon   No facility-administered encounter medications on file as of 03/18/2019.     ALLERGIES:  Allergies  Allergen Reactions  . Codeine Swelling    Tongue swells  . Ceftin [Cefuroxime Axetil] Swelling  . Metformin And Related Diarrhea  . Nexium [Esomeprazole Magnesium] Diarrhea  . Omnicef [Cefdinir] Diarrhea  . Sulfa Antibiotics Rash        LABORATORY DATA:  I have reviewed the labs as listed.  CBC    Component Value Date/Time   WBC 8.9 03/11/2019 1304   RBC 3.17 (L) 03/11/2019 1304   HGB 9.6 (L) 03/11/2019 1304   HGB 10.7 (L) 01/22/2017 1008   HCT 30.9 (L) 03/11/2019 1304   HCT 33.0 (L) 01/22/2017 1008   PLT 249 03/11/2019 1304   PLT 280 01/22/2017 1008   MCV 97.5 03/11/2019 1304   MCV 88 01/22/2017 1008   MCH 30.3 03/11/2019 1304   MCHC 31.1 03/11/2019 1304   RDW 13.2 03/11/2019 1304   RDW 14.4 01/22/2017 1008   LYMPHSABS 1.2 03/11/2019 1304   LYMPHSABS 2.7 01/22/2017 1008   MONOABS 0.7 03/11/2019 1304   EOSABS 0.0 03/11/2019 1304   EOSABS 0.3 01/22/2017 1008   BASOSABS 0.0 03/11/2019 1304   BASOSABS 0.0 01/22/2017 1008   CMP Latest Ref Rng & Units 03/11/2019 12/08/2018 09/26/2018  Glucose 70 - 99 mg/dL 117(H) 127(H) 102(H)  BUN 8 - 23 mg/dL 21 18 32(H)  Creatinine 0.44 - 1.00 mg/dL 0.91 0.95 1.28(H)  Sodium 135 - 145 mmol/L 137 139 135  Potassium 3.5 - 5.1 mmol/L 3.4(L) 3.9 3.8  Chloride 98 - 111 mmol/L 98 100 95(L)  CO2 22 - 32 mmol/L 30 30 30   Calcium 8.9 - 10.3 mg/dL 9.1 9.4 9.1  Total Protein 6.5 - 8.1 g/dL 7.7 7.7 8.0  Total Bilirubin 0.3 - 1.2 mg/dL 0.4 0.6 0.7  Alkaline Phos 38 - 126 U/L 53 63 61  AST 15 - 41 U/L 16 21 18   ALT 0 - 44 U/L 12 17 15        DIAGNOSTIC IMAGING:  I have independently reviewed the scans and discussed with the patient.   I have reviewed Venita Lick LPN's note  and agree with the documentation.  I personally performed a face-to-face visit, made revisions and my assessment and plan is as follows.    ASSESSMENT & PLAN:   Normocytic anemia 1.  Normocytic anemia: -Differential diagnosis includes CKD and relative iron deficiency state. - recent CBC on 09/03/2018 showed hemoglobin of 8.3. - CBC repeated by Dr. Willey Blade on 09/11/2018 shows hemoglobin of 9.3, MCV of 86 with normal white count and platelet count. - Serum ferritin was 92, percent saturation was 15  with a TIBC of 336.  Vitamin G25 was 638 and folic acid was 93.7.  SPEP was negative. -Last colonoscopy was on 10/28/2015 which showed diverticulosis and tubular adenoma.  Recent stool for occult blood was also found to be negative by Dr. Willey Blade.  Patient denies any bleeding per rectum or melena. - Last Feraheme infusion was on 10/07/2018 and 10/14/2018. -Today she denied any bleeding per rectum or melena over the last several weeks.  However she complains of decreased energy and shortness of breath on exertion.  Denies any fevers, night sweats or weight loss. - I have reviewed her blood work over the phone.  Hemoglobin has decreased to 9.6.  Ferritin was 240.  Percent saturation was low at 18.  I have recommended 2 more infusions of Feraheme to increase the percent saturation close to 30.  This will likely improve her hemoglobin as well as energy levels.  She is agreeable to this. -I will see her back in 2 months for follow-up.  2.  History of left breast cancer: - Was initially diagnosed with left breast cancer in 1990, status post lumpectomy and lymph node dissection.  She also received chemotherapy followed by radiation followed by 5 years of tamoxifen. - Had a recurrence of breast cancer in the left breast in 1995 and underwent mastectomy, breast implant  followed by chemotherapy.  3.  B12 deficiency: -She is continuing B12 1 mg tablet daily.  Her B12 level was within normal limits.       Orders placed this encounter:  Orders Placed This Encounter  Procedures  . CBC with Differential/Platelet  . Comprehensive metabolic panel  . Ferritin  . Iron and TIBC      Derek Jack, MD [place] [phone]

## 2019-03-30 ENCOUNTER — Other Ambulatory Visit: Payer: Self-pay

## 2019-03-30 ENCOUNTER — Encounter (HOSPITAL_COMMUNITY): Payer: Self-pay

## 2019-03-30 ENCOUNTER — Inpatient Hospital Stay (HOSPITAL_COMMUNITY): Payer: Medicare Other | Attending: Hematology

## 2019-03-30 VITALS — BP 101/67 | HR 64 | Temp 97.7°F | Resp 18

## 2019-03-30 DIAGNOSIS — D649 Anemia, unspecified: Secondary | ICD-10-CM

## 2019-03-30 DIAGNOSIS — D509 Iron deficiency anemia, unspecified: Secondary | ICD-10-CM | POA: Diagnosis present

## 2019-03-30 MED ORDER — SODIUM CHLORIDE 0.9 % IV SOLN
Freq: Once | INTRAVENOUS | Status: AC
  Administered 2019-03-30: 09:00:00 via INTRAVENOUS

## 2019-03-30 MED ORDER — SODIUM CHLORIDE 0.9 % IV SOLN
510.0000 mg | Freq: Once | INTRAVENOUS | Status: AC
  Administered 2019-03-30: 510 mg via INTRAVENOUS
  Filled 2019-03-30: qty 510

## 2019-03-30 NOTE — Patient Instructions (Signed)
[facility] at [facility] Discharge Instructions  Received Feraheme infusion today. Follow-up as scheduled. Call clinic for any questions or concerns   Thank you for choosing [facility] at [facility] to provide your oncology and hematology care.  To afford each patient quality time with our provider, please arrive at least 15 minutes before your scheduled appointment time.   If you have a lab appointment with the Cancer Center please come in thru the  Main Entrance and check in at the main information desk  You need to re-schedule your appointment should you arrive 10 or more minutes late.  We strive to give you quality time with our providers, and arriving late affects you and other patients whose appointments are after yours.  Also, if you no show three or more times for appointments you may be dismissed from the clinic at the providers discretion.     Again, thank you for choosing [facility].  Our hope is that these requests will decrease the amount of time that you wait before being seen by our physicians.       _____________________________________________________________  Should you have questions after your visit to [facility], please contact our office at [phone] between the hours of 8:00 a.m. and 4:30 p.m.  Voicemails left after 4:00 p.m. will not be returned until the following business day.  For prescription refill requests, have your pharmacy contact our office and allow 72 hours.    Cancer Center Support Programs:   > Cancer Support Group  2nd Tuesday of the month 1pm-2pm, Journey Room   

## 2019-03-30 NOTE — Progress Notes (Signed)
Ann Fuller tolerated Feraheme infusion well without complaints or incident.Peripheral IV site checked with positive blood return noted prior to and after infusion. VSS upon discharge. Pt discharged via wheelchair in satisfactory condition

## 2019-04-06 ENCOUNTER — Other Ambulatory Visit: Payer: Self-pay

## 2019-04-06 ENCOUNTER — Inpatient Hospital Stay (HOSPITAL_COMMUNITY): Payer: Medicare Other

## 2019-04-06 ENCOUNTER — Encounter (HOSPITAL_COMMUNITY): Payer: Self-pay

## 2019-04-06 VITALS — BP 108/40 | HR 65 | Temp 97.6°F | Resp 18

## 2019-04-06 DIAGNOSIS — D509 Iron deficiency anemia, unspecified: Secondary | ICD-10-CM | POA: Diagnosis not present

## 2019-04-06 DIAGNOSIS — D649 Anemia, unspecified: Secondary | ICD-10-CM

## 2019-04-06 MED ORDER — SODIUM CHLORIDE 0.9 % IV SOLN
510.0000 mg | Freq: Once | INTRAVENOUS | Status: AC
  Administered 2019-04-06: 510 mg via INTRAVENOUS
  Filled 2019-04-06: qty 510

## 2019-04-06 MED ORDER — SODIUM CHLORIDE 0.9 % IV SOLN
Freq: Once | INTRAVENOUS | Status: AC
  Administered 2019-04-06: 09:00:00 via INTRAVENOUS

## 2019-04-06 NOTE — Progress Notes (Signed)
feraheme given today per orders. Patient tolerated it well without problems. Vitals stable and discharged home from clinic via wheelchair.  Follow up as scheduled.

## 2019-04-06 NOTE — Patient Instructions (Signed)
[facility] at [facility]  Discharge Instructions:   _______________________________________________________________  Thank you for choosing [facility] at [facility] to provide your oncology and hematology care.  To afford each patient quality time with our providers, please arrive at least 15 minutes before your scheduled appointment.  You need to re-schedule your appointment if you arrive 10 or more minutes late.  We strive to give you quality time with our providers, and arriving late affects you and other patients whose appointments are after yours.  Also, if you no show three or more times for appointments you may be dismissed from the clinic.  Again, thank you for choosing [facility] at [facility]. Our hope is that these requests will allow you access to exceptional care and in a timely manner. _______________________________________________________________  If you have questions after your visit, please contact our office at [phone] between the hours of 8:30 a.m. and 5:00 p.m. Voicemails left after 4:30 p.m. will not be returned until the following business day. _______________________________________________________________  For prescription refill requests, have your pharmacy contact our office. _______________________________________________________________  Recommendations made by the consultant and any test results will be sent to your referring physician. _______________________________________________________________ 

## 2019-04-24 NOTE — Progress Notes (Signed)
Total time spent is 10 minutes on encounter on 03/18/2019.

## 2019-05-13 ENCOUNTER — Inpatient Hospital Stay (HOSPITAL_COMMUNITY): Payer: Medicare Other | Attending: Hematology

## 2019-05-13 ENCOUNTER — Other Ambulatory Visit: Payer: Self-pay

## 2019-05-13 DIAGNOSIS — Z90710 Acquired absence of both cervix and uterus: Secondary | ICD-10-CM | POA: Diagnosis not present

## 2019-05-13 DIAGNOSIS — Z794 Long term (current) use of insulin: Secondary | ICD-10-CM | POA: Diagnosis not present

## 2019-05-13 DIAGNOSIS — Z801 Family history of malignant neoplasm of trachea, bronchus and lung: Secondary | ICD-10-CM | POA: Diagnosis not present

## 2019-05-13 DIAGNOSIS — Z79899 Other long term (current) drug therapy: Secondary | ICD-10-CM | POA: Diagnosis not present

## 2019-05-13 DIAGNOSIS — I10 Essential (primary) hypertension: Secondary | ICD-10-CM | POA: Insufficient documentation

## 2019-05-13 DIAGNOSIS — Z853 Personal history of malignant neoplasm of breast: Secondary | ICD-10-CM | POA: Diagnosis not present

## 2019-05-13 DIAGNOSIS — E119 Type 2 diabetes mellitus without complications: Secondary | ICD-10-CM | POA: Insufficient documentation

## 2019-05-13 DIAGNOSIS — Z9012 Acquired absence of left breast and nipple: Secondary | ICD-10-CM | POA: Insufficient documentation

## 2019-05-13 DIAGNOSIS — D649 Anemia, unspecified: Secondary | ICD-10-CM | POA: Diagnosis present

## 2019-05-13 DIAGNOSIS — M79662 Pain in left lower leg: Secondary | ICD-10-CM | POA: Insufficient documentation

## 2019-05-13 DIAGNOSIS — E538 Deficiency of other specified B group vitamins: Secondary | ICD-10-CM | POA: Insufficient documentation

## 2019-05-13 DIAGNOSIS — Z800 Family history of malignant neoplasm of digestive organs: Secondary | ICD-10-CM | POA: Diagnosis not present

## 2019-05-13 LAB — CBC WITH DIFFERENTIAL/PLATELET
Abs Immature Granulocytes: 0.02 10*3/uL (ref 0.00–0.07)
Basophils Absolute: 0 10*3/uL (ref 0.0–0.1)
Basophils Relative: 0 %
Eosinophils Absolute: 0.2 10*3/uL (ref 0.0–0.5)
Eosinophils Relative: 2 %
HCT: 33.7 % — ABNORMAL LOW (ref 36.0–46.0)
Hemoglobin: 10.7 g/dL — ABNORMAL LOW (ref 12.0–15.0)
Immature Granulocytes: 0 %
Lymphocytes Relative: 16 %
Lymphs Abs: 1.3 10*3/uL (ref 0.7–4.0)
MCH: 30.8 pg (ref 26.0–34.0)
MCHC: 31.8 g/dL (ref 30.0–36.0)
MCV: 97.1 fL (ref 80.0–100.0)
Monocytes Absolute: 0.7 10*3/uL (ref 0.1–1.0)
Monocytes Relative: 8 %
Neutro Abs: 5.9 10*3/uL (ref 1.7–7.7)
Neutrophils Relative %: 74 %
Platelets: 227 10*3/uL (ref 150–400)
RBC: 3.47 MIL/uL — ABNORMAL LOW (ref 3.87–5.11)
RDW: 13.3 % (ref 11.5–15.5)
WBC: 8.1 10*3/uL (ref 4.0–10.5)
nRBC: 0 % (ref 0.0–0.2)

## 2019-05-13 LAB — IRON AND TIBC
Iron: 70 ug/dL (ref 28–170)
Saturation Ratios: 27 % (ref 10.4–31.8)
TIBC: 259 ug/dL (ref 250–450)
UIBC: 189 ug/dL

## 2019-05-13 LAB — COMPREHENSIVE METABOLIC PANEL
ALT: 19 U/L (ref 0–44)
AST: 19 U/L (ref 15–41)
Albumin: 3.8 g/dL (ref 3.5–5.0)
Alkaline Phosphatase: 64 U/L (ref 38–126)
Anion gap: 10 (ref 5–15)
BUN: 24 mg/dL — ABNORMAL HIGH (ref 8–23)
CO2: 31 mmol/L (ref 22–32)
Calcium: 9.2 mg/dL (ref 8.9–10.3)
Chloride: 100 mmol/L (ref 98–111)
Creatinine, Ser: 1.16 mg/dL — ABNORMAL HIGH (ref 0.44–1.00)
GFR calc Af Amer: 55 mL/min — ABNORMAL LOW (ref >60)
GFR calc non Af Amer: 48 mL/min — ABNORMAL LOW (ref >60)
Glucose, Bld: 123 mg/dL — ABNORMAL HIGH (ref 70–99)
Potassium: 3.5 mmol/L (ref 3.5–5.1)
Sodium: 141 mmol/L (ref 135–145)
Total Bilirubin: 0.5 mg/dL (ref 0.3–1.2)
Total Protein: 7.4 g/dL (ref 6.5–8.1)

## 2019-05-13 LAB — FERRITIN: Ferritin: 594 ng/mL — ABNORMAL HIGH (ref 11–307)

## 2019-05-14 ENCOUNTER — Other Ambulatory Visit (HOSPITAL_COMMUNITY): Payer: Medicare Other

## 2019-05-19 ENCOUNTER — Telehealth: Payer: Self-pay

## 2019-05-19 NOTE — Telephone Encounter (Signed)
Pt called a second time today to discuss her appointment scheduled for this Thursday 05/21/19. She has another appointment that she forgot she had. She apologized for calling in today and cancelling the appointment. Pt is currently taking Pantoprazole 40 mg bid, Dicyclomine 10 mg bid. Pt has 5-6 bowel movements a day and it starts off like a regular bowel movement and then changes to diarrhea. Pt reports stomach pain that comes and goes. Pt sometimes wakes up with it. She does get chills sometimes after having that many bowel movements but reports no fever.

## 2019-05-19 NOTE — Telephone Encounter (Signed)
Noted. We will discuss at her appointment.

## 2019-05-19 NOTE — Telephone Encounter (Signed)
Pt cancelled that appointment for this Thursday since she had another appointment scheduled prior to that appointment.

## 2019-05-20 ENCOUNTER — Inpatient Hospital Stay (HOSPITAL_COMMUNITY): Payer: Medicare Other | Admitting: Hematology

## 2019-05-20 ENCOUNTER — Ambulatory Visit (HOSPITAL_COMMUNITY)
Admission: RE | Admit: 2019-05-20 | Discharge: 2019-05-20 | Disposition: A | Discharge status: Home / Self Care | Payer: Medicare Other | Source: Ambulatory Visit | Attending: Hematology | Admitting: Hematology

## 2019-05-20 ENCOUNTER — Other Ambulatory Visit: Payer: Self-pay

## 2019-05-20 ENCOUNTER — Encounter (HOSPITAL_COMMUNITY): Payer: Self-pay | Admitting: Hematology

## 2019-05-20 VITALS — BP 110/35 | HR 78 | Temp 98.5°F | Resp 18 | Wt 234.0 lb

## 2019-05-20 DIAGNOSIS — Z794 Long term (current) use of insulin: Secondary | ICD-10-CM

## 2019-05-20 DIAGNOSIS — D649 Anemia, unspecified: Secondary | ICD-10-CM

## 2019-05-20 DIAGNOSIS — Z79899 Other long term (current) drug therapy: Secondary | ICD-10-CM

## 2019-05-20 DIAGNOSIS — Z853 Personal history of malignant neoplasm of breast: Secondary | ICD-10-CM | POA: Diagnosis not present

## 2019-05-20 DIAGNOSIS — M79662 Pain in left lower leg: Secondary | ICD-10-CM

## 2019-05-20 DIAGNOSIS — Z800 Family history of malignant neoplasm of digestive organs: Secondary | ICD-10-CM

## 2019-05-20 DIAGNOSIS — E119 Type 2 diabetes mellitus without complications: Secondary | ICD-10-CM

## 2019-05-20 DIAGNOSIS — E538 Deficiency of other specified B group vitamins: Secondary | ICD-10-CM | POA: Diagnosis not present

## 2019-05-20 DIAGNOSIS — Z801 Family history of malignant neoplasm of trachea, bronchus and lung: Secondary | ICD-10-CM

## 2019-05-20 DIAGNOSIS — Z9012 Acquired absence of left breast and nipple: Secondary | ICD-10-CM

## 2019-05-20 DIAGNOSIS — Z90710 Acquired absence of both cervix and uterus: Secondary | ICD-10-CM

## 2019-05-20 DIAGNOSIS — I10 Essential (primary) hypertension: Secondary | ICD-10-CM

## 2019-05-20 NOTE — Patient Instructions (Addendum)
[facility] at [facility] Discharge Instructions  You were seen today by Dr. Katragadda. He went over your recent lab results. He will see you back in 3 months for labs and follow up.   Thank you for choosing [facility] at [facility] to provide your oncology and hematology care.  To afford each patient quality time with our provider, please arrive at least 15 minutes before your scheduled appointment time.   If you have a lab appointment with the Cancer Center please come in thru the  Main Entrance and check in at the main information desk  You need to re-schedule your appointment should you arrive 10 or more minutes late.  We strive to give you quality time with our providers, and arriving late affects you and other patients whose appointments are after yours.  Also, if you no show three or more times for appointments you may be dismissed from the clinic at the providers discretion.     Again, thank you for choosing [facility].  Our hope is that these requests will decrease the amount of time that you wait before being seen by our physicians.       _____________________________________________________________  Should you have questions after your visit to  Cancer Center, please contact our office at [phone] between the hours of 8:00 a.m. and 4:30 p.m.  Voicemails left after 4:00 p.m. will not be returned until the following business day.  For prescription refill requests, have your pharmacy contact our office and allow 72 hours.    Cancer Center Support Programs:   > Cancer Support Group  2nd Tuesday of the month 1pm-2pm, Journey Room    

## 2019-05-20 NOTE — Assessment & Plan Note (Signed)
1.  Normocytic anemia: -Etiology is CKD and relative iron deficiency state. -Last colonoscopy on 10/28/2015 shows diverticulosis and tubular adenoma.  Recent stool for occult blood was also negative.  Patient denies any bleeding per rectum or melena. -Last Feraheme infusion on 03/30/2019 and 04/06/2019. -Differential diagnosis includes CKD and relative iron deficiency state. -He denies any fevers, night sweats or weight loss.  We reviewed her blood work.  Hemoglobin improved to 10.7.  Ferritin is 94.  She does not require any further iron at this time. - She reported having black stools for the last few weeks.  She has an appointment to see Dr. Buford Dresser on 06/08/2019.  She is apparently taking multivitamin with iron in it.  I have told her to discontinue it and see if it improves. -I will see her back in 3 months with repeat blood work and iron panel.  2.  Left calf pain: -She reported pain in her left calf for the last 3 to 4 days.  Denies any chest pains. - I have ordered a left lower extremity Doppler which was negative for DVT.  3.  History of left breast cancer: -Treated with lumpectomy and lymph node dissection in 1990.  She also received chemotherapy followed by radiation followed by 5 years of tamoxifen. - She had a recurrence of breast cancer in the left breast 1995 and underwent mastectomy, breast implant followed by chemotherapy.  4.  B12 deficiency: -She is continuing B12 1 mg tablet daily.  B12 is within normal limits.

## 2019-05-20 NOTE — Progress Notes (Signed)
[place], [place] [address]   CLINIC:  Medical Oncology/Hematology  PCP:  Asencion Noble, MD [address] [place] [address] [phone]   REASON FOR VISIT:  Follow-up for normocytic anemia    INTERVAL HISTORY:  Ann Fuller 73 y.o. female returns for routine follow-up. She is here today alone. She states that she states that she has noticed dark black stools for the last week. She was advised to stop taking her iron supplement for a few days to see if her stools go back to normal, and to keep her appointment with Dr. Gala Romney. She states that she has contacted Dr. Roseanne Kaufman office and has an appointment with them soon. She states that she has diarrhea and Dr. Gala Romney gave her imodium to take.  Denies any nausea, or vomiting. Denies any new pains. Had not noticed any recent bleeding such as epistaxis, hematuria or hematochezia. Denies recent chest pain on exertion, shortness of breath on minimal exertion, pre-syncopal episodes, or palpitations. Denies any numbness or tingling in hands or feet. Denies any recent fevers, infections, or recent hospitalizations. Patient reports appetite at 75% and energy level at 25%.    REVIEW OF SYSTEMS:  Review of Systems  Gastrointestinal: Positive for diarrhea.     PAST MEDICAL/SURGICAL HISTORY:  Past Medical History:  Diagnosis Date  . Adenomatous colon polyp   . Arthritis   . Asthma   . Breast cancer ([place]) [id number]  . Bulging lumbar disc   . CHF (congestive heart failure) ([place])   . Depression   . Diverticulitis   . Dyspepsia   . Essential hypertension, benign   . GERD (gastroesophageal reflux disease)   . Heart murmur   . Hyperlipidemia   . IBS (irritable bowel syndrome)   . Internal hemorrhoid   . Right shoulder pain   . Tubular adenoma 11/2010  . Type 2 diabetes mellitus ([place])    Past Surgical History:  Procedure Laterality Date  . ABDOMINAL HYSTERECTOMY    . APPENDECTOMY    . BACK  SURGERY  2007   Lumbar fusion  . CATARACT EXTRACTION W/PHACO Left 08/27/2013   Procedure: CATARACT EXTRACTION PHACO AND INTRAOCULAR LENS PLACEMENT (IOC);  Surgeon: Tonny Branch, MD;  Location: [facility];  Service: Ophthalmology;  Laterality: Left;  CDE 4.25  . CATARACT EXTRACTION W/PHACO Right 09/24/2013   Procedure: CATARACT EXTRACTION PHACO AND INTRAOCULAR LENS PLACEMENT (IOC);  Surgeon: Tonny Branch, MD;  Location: [facility];  Service: Ophthalmology;  Laterality: Right;  CDE:  8.16  . CHOLECYSTECTOMY    . COLONOSCOPY  12/13/2010   Dr. Margarito Courser pancolonic diverticular.tubular adenoma  . COLONOSCOPY  07/13/2003  . COLONOSCOPY N/A 10/28/2015   [id number] diverticulosis, multiple tubular adenomas removed. next tcs 10/2020.   . ESOPHAGOGASTRODUODENOSCOPY  12/13/2010   Dr. Jennet Maduro hernia, fundal gland type polyps  . EYE SURGERY Left    Left KPE 08/27/13  . MASTECTOMY Left 1995  . NASAL ENDOSCOPY WITH EPISTAXIS CONTROL Left 07/16/2016   Procedure: ENDOSCOPIC LEFT NASAL CAUTERY;  Surgeon: Leta Baptist, MD;  Location: [facility];  Service: ENT;  Laterality: Left;  . TONSILLECTOMY    . TUBAL LIGATION       SOCIAL HISTORY:  Social History   Socioeconomic History  . Marital status: Married    Spouse name: Not on file  . Number of children: Not on file  . Years of education: Not on file  . Highest education level: Not on file  Occupational  History  . Not on file  Social Needs  . Financial resource strain: Not on file  . Food insecurity:    Worry: Not on file    Inability: Not on file  . Transportation needs:    Medical: Not on file    Non-medical: Not on file  Tobacco Use  . Smoking status: Never Smoker  . Smokeless tobacco: Never Used  Substance and Sexual Activity  . Alcohol use: No  . Drug use: No  . Sexual activity: Yes    Birth control/protection: Surgical  Lifestyle  . Physical activity:    Days per week: Not on file    Minutes per session: Not on file  .  Stress: Not on file  Relationships  . Social connections:    Talks on phone: Not on file    Gets together: Not on file    Attends religious service: Not on file    Active member of club or organization: Not on file    Attends meetings of clubs or organizations: Not on file    Relationship status: Not on file  . Intimate partner violence:    Fear of current or ex partner: Not on file    Emotionally abused: Not on file    Physically abused: Not on file    Forced sexual activity: Not on file  Other Topics Concern  . Not on file  Social History Narrative  . Not on file    FAMILY HISTORY:  Family History  Problem Relation Age of Onset  . Colon cancer Mother   . Heart attack Father   . Heart attack Sister   . Stroke Sister   . Cancer - Lung Sister     CURRENT MEDICATIONS:  Outpatient Encounter Medications as of 05/20/2019  Medication Sig  . acetaminophen (TYLENOL) 650 MG CR tablet Take 650 mg by mouth every 8 (eight) hours as needed for pain.  Marland Kitchen albuterol (PROVENTIL HFA;VENTOLIN HFA) 108 (90 Base) MCG/ACT inhaler Inhale 2 puffs into the lungs every 4 (four) hours as needed for wheezing or shortness of breath.  Marland Kitchen amLODipine (NORVASC) 5 MG tablet Take 5 mg by mouth daily.  Marland Kitchen atorvastatin (LIPITOR) 40 MG tablet Take 40 mg by mouth at bedtime.   . baclofen (LIORESAL) 10 MG tablet Take 5 mg by mouth 3 (three) times daily as needed for muscle spasms.   . Benralizumab (FASENRA) 30 MG/ML SOSY Inject 30 mg into the skin every 8 (eight) weeks.   . budesonide-formoterol (SYMBICORT) 80-4.5 MCG/ACT inhaler Inhale 2 puffs into the lungs 2 (two) times daily.  Marland Kitchen BYDUREON 2 MG PEN   . carvedilol (COREG) 3.125 MG tablet Take 1 tablet (3.125 mg total) by mouth 2 (two) times daily with a meal.  . dicyclomine (BENTYL) 10 MG capsule TAKE ONE CAPSULE BY MOUTH PRIOR TO MEALSAS NEEDED. NO MORE THAN 4 TIMES DAILY. MAY CAUSE DROWINESS.  Marland Kitchen doxazosin (CARDURA) 4 MG tablet Take 4 mg by mouth daily.  Marland Kitchen  doxycycline (VIBRA-TABS) 100 MG tablet   . EPINEPHrine (EPIPEN 2-PAK) 0.3 mg/0.3 mL IJ SOAJ injection Inject 0.3 mg into the muscle daily as needed (allergic reaction).  Marland Kitchen exenatide (BYETTA 10 MCG PEN) 10 MCG/0.04ML SOLN Inject 10 mcg into the skin 2 (two) times daily with a meal.   . fluticasone (FLONASE) 50 MCG/ACT nasal spray Place 1 spray into both nostrils daily as needed for allergies or rhinitis.  . furosemide (LASIX) 40 MG tablet 2 (two) times daily.   Marland Kitchen  HUMALOG KWIKPEN 100 UNIT/ML KiwkPen   . Insulin Glargine (LANTUS) 100 UNIT/ML Solostar Pen Inject 26 Units into the skin every morning.   . Insulin Lispro (HUMALOG KWIKPEN [place]) Inject 6 Units into the skin 2 (two) times daily before a meal.   . insulin lispro (HUMALOG) 100 UNIT/ML KwikPen   . ipratropium-albuterol (DUONEB) 0.5-2.5 (3) MG/3ML SOLN Take 3 mLs by nebulization every 4 (four) hours. (Patient taking differently: Take 3 mLs by nebulization every 6 (six) hours as needed (wheezing and shortness of breath). )  . LORazepam (ATIVAN) 1 MG tablet Take 1 mg by mouth 2 (two) times daily as needed for anxiety or sleep.   . magnesium oxide (MAG-OX) 400 MG tablet Take 400 mg by mouth daily.  . mometasone-formoterol (DULERA) 100-5 MCG/ACT AERO Inhale 2 puffs into the lungs 2 (two) times daily.  . montelukast (SINGULAIR) 10 MG tablet TAKE ONE TABLET BY MOUTH EVERY NIGHT AT BEDTIME (Patient taking differently: Take 10 mg by mouth at bedtime. )  . montelukast (SINGULAIR) 10 MG tablet TAKE ONE TABLET BY MOUTH EVERY DAY  . olmesartan (BENICAR) 40 MG tablet Take 40 mg by mouth daily.   . pantoprazole (PROTONIX) 40 MG tablet TAKE ONE TABLET BY MOUTH TWICE A DAY BEFORE A MEAL  . predniSONE (DELTASONE) 10 MG tablet   . sertraline (ZOLOFT) 50 MG tablet Take 50 mg by mouth daily.   Marland Kitchen Spacer/Aero-Holding Chambers (AEROCHAMBER MV) inhaler Use as instructed  . SPIRIVA HANDIHALER 18 MCG inhalation capsule   . temazepam (RESTORIL) 15 MG capsule Take 15 mg  by mouth at bedtime as needed for sleep.  . vitamin C (ASCORBIC ACID) 500 MG tablet Take 500 mg by mouth daily.    No facility-administered encounter medications on file as of 05/20/2019.     ALLERGIES:  Allergies  Allergen Reactions  . Codeine Swelling    Tongue swells  . Ceftin [Cefuroxime Axetil] Swelling  . Metformin And Related Diarrhea  . Nexium [Esomeprazole Magnesium] Diarrhea  . Omnicef [Cefdinir] Diarrhea  . Sulfa Antibiotics Rash     PHYSICAL EXAM:  ECOG Performance status: 2  Vitals:   05/20/19 1012  BP: (!) 110/35  Pulse: 78  Resp: 18  Temp: 98.5 F (36.9 C)  SpO2: 94%   Filed Weights   05/20/19 1012  Weight: 234 lb (106.1 kg)    Physical Exam Vitals signs reviewed.  Constitutional:      Appearance: Normal appearance.  Cardiovascular:     Rate and Rhythm: Normal rate and regular rhythm.     Heart sounds: Normal heart sounds.  Pulmonary:     Effort: Pulmonary effort is normal.     Breath sounds: Normal breath sounds.  Abdominal:     General: There is no distension.     Palpations: Abdomen is soft. There is no mass.  Musculoskeletal:        General: No swelling.  Skin:    General: Skin is warm.  Neurological:     General: No focal deficit present.     Mental Status: She is alert and oriented to person, place, and time.  Psychiatric:        Mood and Affect: Mood normal.        Behavior: Behavior normal.      LABORATORY DATA:  I have reviewed the labs as listed.  CBC    Component Value Date/Time   WBC 8.1 05/13/2019 1157   RBC 3.47 (L) 05/13/2019 1157   HGB 10.7 (L) 05/13/2019  1157   HGB 10.7 (L) 01/22/2017 1008   HCT 33.7 (L) 05/13/2019 1157   HCT 33.0 (L) 01/22/2017 1008   PLT 227 05/13/2019 1157   PLT 280 01/22/2017 1008   MCV 97.1 05/13/2019 1157   MCV 88 01/22/2017 1008   MCH 30.8 05/13/2019 1157   MCHC 31.8 05/13/2019 1157   RDW 13.3 05/13/2019 1157   RDW 14.4 01/22/2017 1008   LYMPHSABS 1.3 05/13/2019 1157   LYMPHSABS  2.7 01/22/2017 1008   MONOABS 0.7 05/13/2019 1157   EOSABS 0.2 05/13/2019 1157   EOSABS 0.3 01/22/2017 1008   BASOSABS 0.0 05/13/2019 1157   BASOSABS 0.0 01/22/2017 1008   CMP Latest Ref Rng & Units 05/13/2019 03/11/2019 12/08/2018  Glucose 70 - 99 mg/dL 123(H) 117(H) 127(H)  BUN 8 - 23 mg/dL 24(H) 21 18  Creatinine 0.44 - 1.00 mg/dL 1.16(H) 0.91 0.95  Sodium 135 - 145 mmol/L 141 137 139  Potassium 3.5 - 5.1 mmol/L 3.5 3.4(L) 3.9  Chloride 98 - 111 mmol/L 100 98 100  CO2 22 - 32 mmol/L 31 30 30   Calcium 8.9 - 10.3 mg/dL 9.2 9.1 9.4  Total Protein 6.5 - 8.1 g/dL 7.4 7.7 7.7  Total Bilirubin 0.3 - 1.2 mg/dL 0.5 0.4 0.6  Alkaline Phos 38 - 126 U/L 64 53 63  AST 15 - 41 U/L 19 16 21   ALT 0 - 44 U/L 19 12 17        DIAGNOSTIC IMAGING:  I have independently reviewed the scans and discussed with the patient.   I have reviewed Venita Lick LPN's note and agree with the documentation.  I personally performed a face-to-face visit, made revisions and my assessment and plan is as follows.    ASSESSMENT & PLAN:   Normocytic anemia 1.  Normocytic anemia: -Etiology is CKD and relative iron deficiency state. -Last colonoscopy on 10/28/2015 shows diverticulosis and tubular adenoma.  Recent stool for occult blood was also negative.  Patient denies any bleeding per rectum or melena. -Last Feraheme infusion on 03/30/2019 and 04/06/2019. -Differential diagnosis includes CKD and relative iron deficiency state. -He denies any fevers, night sweats or weight loss.  We reviewed her blood work.  Hemoglobin improved to 10.7.  Ferritin is 94.  She does not require any further iron at this time. - She reported having black stools for the last few weeks.  She has an appointment to see Dr. Buford Dresser on 06/08/2019.  She is apparently taking multivitamin with iron in it.  I have told her to discontinue it and see if it improves. -I will see her back in 3 months with repeat blood work and iron panel.  2.  Left calf  pain: -She reported pain in her left calf for the last 3 to 4 days.  Denies any chest pains. - I have ordered a left lower extremity Doppler which was negative for DVT.  3.  History of left breast cancer: -Treated with lumpectomy and lymph node dissection in 1990.  She also received chemotherapy followed by radiation followed by 5 years of tamoxifen. - She had a recurrence of breast cancer in the left breast 1995 and underwent mastectomy, breast implant followed by chemotherapy.  4.  B12 deficiency: -She is continuing B12 1 mg tablet daily.  B12 is within normal limits.     Total time spent is 25 minutes with more than 50% of the time spent face-to-face discussing work-up plan and management and coordination of care.    Orders placed this  encounter:  Orders Placed This Encounter  Procedures  . US Venous Img Lower Unilateral Left  . CBC with Differential/Platelet  . Comprehensive metabolic panel  . Iron and TIBC  . Ferritin      Derek Jack, MD [place] [phone]

## 2019-05-20 NOTE — Telephone Encounter (Signed)
She can take dicyclomine before meals, up to 4 times a day. Monitor for constipation. I haven't seen her in almost 8 years. Randall Hiss last saw her. I can't provide any further recommendations without an office visit or virtual visit. Need to sort this out further in an office visit.

## 2019-05-20 NOTE — Telephone Encounter (Signed)
Pt called back to schedule apt and was scheduled for 05/27/19 (telephone visit).

## 2019-05-20 NOTE — Telephone Encounter (Signed)
Noted. Pt notified of AB's recommendation. Pt would like a Tuesday appointment and would like [place] to call her back to schedule it.

## 2019-05-21 ENCOUNTER — Ambulatory Visit: Payer: Medicare Other | Admitting: Gastroenterology

## 2019-05-27 ENCOUNTER — Other Ambulatory Visit: Payer: Self-pay | Admitting: *Deleted

## 2019-05-27 ENCOUNTER — Telehealth: Payer: Self-pay | Admitting: *Deleted

## 2019-05-27 ENCOUNTER — Encounter: Payer: Self-pay | Admitting: Gastroenterology

## 2019-05-27 ENCOUNTER — Other Ambulatory Visit: Payer: Self-pay

## 2019-05-27 ENCOUNTER — Ambulatory Visit (INDEPENDENT_AMBULATORY_CARE_PROVIDER_SITE_OTHER): Payer: Medicare Other | Admitting: Gastroenterology

## 2019-05-27 DIAGNOSIS — R1310 Dysphagia, unspecified: Secondary | ICD-10-CM

## 2019-05-27 DIAGNOSIS — D649 Anemia, unspecified: Secondary | ICD-10-CM | POA: Diagnosis not present

## 2019-05-27 DIAGNOSIS — K580 Irritable bowel syndrome with diarrhea: Secondary | ICD-10-CM

## 2019-05-27 DIAGNOSIS — K921 Melena: Secondary | ICD-10-CM

## 2019-05-27 MED ORDER — PEG 3350-KCL-NA BICARB-NACL 420 G PO SOLR: 4000.0000 mL | Freq: Once | ORAL | 0 refills | Status: AC

## 2019-05-27 NOTE — H&P (View-Only) (Signed)
Primary Care Physician:  Asencion Noble, MD  Primary GI: Dr. Gala Romney   Patient Location: Home   Provider Location: [facility] office   Reason for Visit: Follow-up    Persons present on the virtual encounter, with roles: Patient and NP   Total time (minutes) spent on medical discussion: 30 minutes   Due to COVID-19, visit was conducted using telephone method.  Visit was requested by patient.  Virtual Visit via Telephone Note Due to COVID-19, visit is conducted virtually and was requested by patient.   I connected with Ann Fuller on 05/27/19 at  3:00 PM EDT by telephone and verified that I am speaking with the correct person using two identifiers.   I discussed the limitations, risks, security and privacy concerns of performing an evaluation and management service by telephone and the availability of in person appointments. I also discussed with the patient that there may be a patient responsible charge related to this service. The patient expressed understanding and agreed to proceed.  Chief Complaint  Patient presents with  . Diarrhea    Abd pain,Black stools,Has been getting infusions for low blood count at [facility]     History of Present Illness: 73 year old female presenting today in follow-up for abdominal pain and diarrhea. Last seen Jan 2020 and was dealing with diarrhea, felt at that time to be more likely a self-limiting process. However, this has persisted. Patient has been unable to return for a visit due to other medical issues. She was found to have normocytic anemia late last year in setting of CKD and relative iron deficiency state. Received feraheme X 2 in April 2020.   History of multiple colon polyps, with last colonoscopy in 2016.   Dysphagia: persistent since last year. Gets strangled easily. No pill dysphagia if taking one at a time. Solid food dysphagia intermittently. States she doesn't have many teeth on the left side and knows this is contributing.    Intermittent abdominal pain, exacerbated by foods like popcorn, nuts. Pain is worse with stress, worry. Pain in LLQ and sometimes LUQ. Pain improved after completely "empyting" her intestines, going 4-5 times. Sometimes starts off as formed then liquid thereafter and sometimes just liquid. Between these episodes will have soft, formed stool without abdominal pain. Has chills with episodes but no fever. Keeps imodium on hand for episodes. Takes Bentyl BID and will increase if needed.   Has been seeing black, loose stool. First day off iron was yellow stool, now back to black off iron for a few days. Stool was black last night but no stool this morning.    Past Medical History:  Diagnosis Date  . Adenomatous colon polyp   . Arthritis   . Asthma   . Breast cancer ([place]) [id number]  . Bulging lumbar disc   . CHF (congestive heart failure) ([place])   . Depression   . Diverticulitis   . Dyspepsia   . Essential hypertension, benign   . GERD (gastroesophageal reflux disease)   . Heart murmur   . Hyperlipidemia   . IBS (irritable bowel syndrome)   . Internal hemorrhoid   . Right shoulder pain   . Tubular adenoma 11/2010  . Type 2 diabetes mellitus ([place])      Past Surgical History:  Procedure Laterality Date  . ABDOMINAL HYSTERECTOMY    . APPENDECTOMY    . BACK SURGERY  2007   Lumbar fusion  . CATARACT EXTRACTION W/PHACO Left 08/27/2013   Procedure: CATARACT EXTRACTION PHACO AND INTRAOCULAR  LENS PLACEMENT (IOC);  Surgeon: Tonny Branch, MD;  Location: [facility];  Service: Ophthalmology;  Laterality: Left;  CDE 4.25  . CATARACT EXTRACTION W/PHACO Right 09/24/2013   Procedure: CATARACT EXTRACTION PHACO AND INTRAOCULAR LENS PLACEMENT (IOC);  Surgeon: Tonny Branch, MD;  Location: [facility];  Service: Ophthalmology;  Laterality: Right;  CDE:  8.16  . CHOLECYSTECTOMY    . COLONOSCOPY  12/13/2010   Dr. Margarito Courser pancolonic diverticular.tubular adenoma  . COLONOSCOPY  07/13/2003  . COLONOSCOPY N/A  10/28/2015   [id number] diverticulosis, multiple tubular adenomas removed. next tcs 10/2020.   . ESOPHAGOGASTRODUODENOSCOPY  12/13/2010   Dr. Jennet Maduro hernia, fundal gland type polyps  . EYE SURGERY Left    Left KPE 08/27/13  . MASTECTOMY Left 1995  . NASAL ENDOSCOPY WITH EPISTAXIS CONTROL Left 07/16/2016   Procedure: ENDOSCOPIC LEFT NASAL CAUTERY;  Surgeon: Leta Baptist, MD;  Location: [facility];  Service: ENT;  Laterality: Left;  . TONSILLECTOMY    . TUBAL LIGATION       Current Meds  Medication Sig  . acetaminophen (TYLENOL) 650 MG CR tablet Take 650 mg by mouth every 8 (eight) hours as needed for pain.  Marland Kitchen albuterol (PROVENTIL HFA;VENTOLIN HFA) 108 (90 Base) MCG/ACT inhaler Inhale 2 puffs into the lungs every 4 (four) hours as needed for wheezing or shortness of breath.  Marland Kitchen amLODipine (NORVASC) 5 MG tablet Take 5 mg by mouth daily.  Marland Kitchen atorvastatin (LIPITOR) 40 MG tablet Take 40 mg by mouth at bedtime.   . Benralizumab (FASENRA) 30 MG/ML SOSY Inject 30 mg into the skin every 8 (eight) weeks.   . budesonide-formoterol (SYMBICORT) 80-4.5 MCG/ACT inhaler Inhale 2 puffs into the lungs 2 (two) times daily.  Marland Kitchen BYDUREON 2 MG PEN once a week.   . carvedilol (COREG) 3.125 MG tablet Take 1 tablet (3.125 mg total) by mouth 2 (two) times daily with a meal.  . dicyclomine (BENTYL) 10 MG capsule TAKE ONE CAPSULE BY MOUTH PRIOR TO MEALSAS NEEDED. NO MORE THAN 4 TIMES DAILY. MAY CAUSE DROWINESS.  Marland Kitchen doxazosin (CARDURA) 4 MG tablet Take 4 mg by mouth daily.  Marland Kitchen EPINEPHrine (EPIPEN 2-PAK) 0.3 mg/0.3 mL IJ SOAJ injection Inject 0.3 mg into the muscle daily as needed (allergic reaction).  . fluticasone (FLONASE) 50 MCG/ACT nasal spray Place 1 spray into both nostrils daily as needed for allergies or rhinitis.  . furosemide (LASIX) 40 MG tablet 2 (two) times daily.   Marland Kitchen HUMALOG KWIKPEN 100 UNIT/ML KiwkPen   . Insulin Glargine (LANTUS) 100 UNIT/ML Solostar Pen Inject 26 Units into the skin every  morning.   . Insulin Lispro (HUMALOG KWIKPEN [place]) Inject 6 Units into the skin 2 (two) times daily before a meal.   . insulin lispro (HUMALOG) 100 UNIT/ML KwikPen   . ipratropium-albuterol (DUONEB) 0.5-2.5 (3) MG/3ML SOLN Take 3 mLs by nebulization every 4 (four) hours. (Patient taking differently: Take 3 mLs by nebulization every 6 (six) hours as needed (wheezing and shortness of breath). )  . LORazepam (ATIVAN) 1 MG tablet Take 1 mg by mouth 2 (two) times daily as needed for anxiety or sleep.   . magnesium oxide (MAG-OX) 400 MG tablet Take 400 mg by mouth daily.  . mometasone-formoterol (DULERA) 100-5 MCG/ACT AERO Inhale 2 puffs into the lungs 2 (two) times daily.  . montelukast (SINGULAIR) 10 MG tablet TAKE ONE TABLET BY MOUTH EVERY NIGHT AT BEDTIME (Patient taking differently: Take 10 mg by mouth at bedtime. )  . olmesartan (BENICAR) 40 MG  tablet Take 40 mg by mouth daily.   . pantoprazole (PROTONIX) 40 MG tablet TAKE ONE TABLET BY MOUTH TWICE A DAY BEFORE A MEAL  . sertraline (ZOLOFT) 50 MG tablet Take 50 mg by mouth daily.   Marland Kitchen Spacer/Aero-Holding Chambers (AEROCHAMBER MV) inhaler Use as instructed  . SPIRIVA HANDIHALER 18 MCG inhalation capsule   . temazepam (RESTORIL) 15 MG capsule Take 15 mg by mouth at bedtime as needed for sleep.  . vitamin C (ASCORBIC ACID) 500 MG tablet Take 500 mg by mouth daily.      Family History  Problem Relation Age of Onset  . Colon cancer Mother   . Heart attack Father   . Heart attack Sister   . Stroke Sister   . Cancer - Lung Sister     Social History   Socioeconomic History  . Marital status: Married    Spouse name: Not on file  . Number of children: Not on file  . Years of education: Not on file  . Highest education level: Not on file  Occupational History  . Not on file  Social Needs  . Financial resource strain: Not on file  . Food insecurity:    Worry: Not on file    Inability: Not on file  . Transportation needs:    Medical: Not on  file    Non-medical: Not on file  Tobacco Use  . Smoking status: Never Smoker  . Smokeless tobacco: Never Used  Substance and Sexual Activity  . Alcohol use: No  . Drug use: No  . Sexual activity: Yes    Birth control/protection: Surgical  Lifestyle  . Physical activity:    Days per week: Not on file    Minutes per session: Not on file  . Stress: Not on file  Relationships  . Social connections:    Talks on phone: Not on file    Gets together: Not on file    Attends religious service: Not on file    Active member of club or organization: Not on file    Attends meetings of clubs or organizations: Not on file    Relationship status: Not on file  Other Topics Concern  . Not on file  Social History Narrative  . Not on file       Review of Systems: Gen: Denies fever, chills, anorexia. Denies fatigue, weakness, weight loss.  CV: Denies chest pain, palpitations, syncope, peripheral edema, and claudication. Resp: Denies dyspnea at rest, cough, wheezing, coughing up blood, and pleurisy. GI: see HPI Derm: Denies rash, itching, dry skin Psych: Denies depression, anxiety, memory loss, confusion. No homicidal or suicidal ideation.  Heme: see HPI  Observations/Objective: No distress. Unable to perform physical exam due to telephone encounter. No video available.   Assessment and Plan: 73 year old female with diarrhea for at least 6 months, with last colonoscopy in 2016 with polyps. This has been persistent with some mild improvement with Bentyl but not taking up to 4 times a day. Doubt infectious process as this is now chronic, with intermittent episodes and normal BMs in between. No hematochezia but has noted darker stool despite stopping iron.   Dysphagia: chronic. Last EGD in remote past. Continue Protonix BID and will arrange EGD with dilatation.  Normocytic anemia: noted late last year and requiring iron infusions. Multifactorial in setting of CKD and IDA. As this is newer  onset, will pursue colonoscopy with EGD/dilatation. Continue to see Hematology.   Proceed with TCS/EGD/dilatation with Dr. Gala Romney in near  future: the risks, benefits, and alternatives have been discussed with the patient in detail. The patient states understanding and desires to proceed. Propofol due to multiple health issues Bentyl QID and in evening as needed Protonix BID Continue to hold iron Close follow-up in 6-8 weeks  Follow Up Instructions:    I discussed the assessment and treatment plan with the patient. The patient was provided an opportunity to ask questions and all were answered. The patient agreed with the plan and demonstrated an understanding of the instructions.   The patient was advised to call back or seek an in-person evaluation if the symptoms worsen or if the condition fails to improve as anticipated.  I provided 30 minutes of non-face-to-face time during this encounter.  Annitta Needs, PhD, ANP-BC [facility]

## 2019-05-27 NOTE — Progress Notes (Signed)
Primary Care Physician:  Asencion Noble, MD  Primary GI: Dr. Gala Romney   Patient Location: Home   Provider Location: [facility] office   Reason for Visit: Follow-up    Persons present on the virtual encounter, with roles: Patient and NP   Total time (minutes) spent on medical discussion: 30 minutes   Due to COVID-19, visit was conducted using telephone method.  Visit was requested by patient.  Virtual Visit via Telephone Note Due to COVID-19, visit is conducted virtually and was requested by patient.   I connected with Ann Fuller on 05/27/19 at  3:00 PM EDT by telephone and verified that I am speaking with the correct person using two identifiers.   I discussed the limitations, risks, security and privacy concerns of performing an evaluation and management service by telephone and the availability of in person appointments. I also discussed with the patient that there may be a patient responsible charge related to this service. The patient expressed understanding and agreed to proceed.  Chief Complaint  Patient presents with  . Diarrhea    Abd pain,Black stools,Has been getting infusions for low blood count at [facility]     History of Present Illness: 73 year old female presenting today in follow-up for abdominal pain and diarrhea. Last seen Jan 2020 and was dealing with diarrhea, felt at that time to be more likely a self-limiting process. However, this has persisted. Patient has been unable to return for a visit due to other medical issues. She was found to have normocytic anemia late last year in setting of CKD and relative iron deficiency state. Received feraheme X 2 in April 2020.   History of multiple colon polyps, with last colonoscopy in 2016.   Dysphagia: persistent since last year. Gets strangled easily. No pill dysphagia if taking one at a time. Solid food dysphagia intermittently. States she doesn't have many teeth on the left side and knows this is contributing.    Intermittent abdominal pain, exacerbated by foods like popcorn, nuts. Pain is worse with stress, worry. Pain in LLQ and sometimes LUQ. Pain improved after completely "empyting" her intestines, going 4-5 times. Sometimes starts off as formed then liquid thereafter and sometimes just liquid. Between these episodes will have soft, formed stool without abdominal pain. Has chills with episodes but no fever. Keeps imodium on hand for episodes. Takes Bentyl BID and will increase if needed.   Has been seeing black, loose stool. First day off iron was yellow stool, now back to black off iron for a few days. Stool was black last night but no stool this morning.    Past Medical History:  Diagnosis Date  . Adenomatous colon polyp   . Arthritis   . Asthma   . Breast cancer ([place]) [id number]  . Bulging lumbar disc   . CHF (congestive heart failure) ([place])   . Depression   . Diverticulitis   . Dyspepsia   . Essential hypertension, benign   . GERD (gastroesophageal reflux disease)   . Heart murmur   . Hyperlipidemia   . IBS (irritable bowel syndrome)   . Internal hemorrhoid   . Right shoulder pain   . Tubular adenoma 11/2010  . Type 2 diabetes mellitus ([place])      Past Surgical History:  Procedure Laterality Date  . ABDOMINAL HYSTERECTOMY    . APPENDECTOMY    . BACK SURGERY  2007   Lumbar fusion  . CATARACT EXTRACTION W/PHACO Left 08/27/2013   Procedure: CATARACT EXTRACTION PHACO AND INTRAOCULAR  LENS PLACEMENT (IOC);  Surgeon: Tonny Branch, MD;  Location: [facility];  Service: Ophthalmology;  Laterality: Left;  CDE 4.25  . CATARACT EXTRACTION W/PHACO Right 09/24/2013   Procedure: CATARACT EXTRACTION PHACO AND INTRAOCULAR LENS PLACEMENT (IOC);  Surgeon: Tonny Branch, MD;  Location: [facility];  Service: Ophthalmology;  Laterality: Right;  CDE:  8.16  . CHOLECYSTECTOMY    . COLONOSCOPY  12/13/2010   Dr. Margarito Courser pancolonic diverticular.tubular adenoma  . COLONOSCOPY  07/13/2003  . COLONOSCOPY N/A  10/28/2015   K[facility]:RKY[facility]WC diverticulosis, multiple tubular adenomas removed. next tcs 10/2020.   . ESOPHAGOGASTRODUODENOSCOPY  12/13/2010   Dr. Jennet Maduro hernia, fundal gland type polyps  . EYE SURGERY Left    Left KPE 08/27/13  . MASTECTOMY Left 1995  . NASAL ENDOSCOPY WITH EPISTAXIS CONTROL Left 07/16/2016   Procedure: ENDOSCOPIC LEFT NASAL CAUTERY;  Surgeon: Leta Baptist, MD;  Location: [place];  Service: ENT;  Laterality: Left;  . TONSILLECTOMY    . TUBAL LIGATION       Current Meds  Medication Sig  . acetaminophen (TYLENOL) 650 MG CR tablet Take 650 mg by mouth every 8 (eight) hours as needed for pain.  Marland Kitchen albuterol (PROVENTIL HFA;VENTOLIN HFA) 108 (90 Base) MCG/ACT inhaler Inhale 2 puffs into the lungs every 4 (four) hours as needed for wheezing or shortness of breath.  Marland Kitchen amLODipine (NORVASC) 5 MG tablet Take 5 mg by mouth daily.  Marland Kitchen atorvastatin (LIPITOR) 40 MG tablet Take 40 mg by mouth at bedtime.   . Benralizumab (FASENRA) 30 MG/ML SOSY Inject 30 mg into the skin every 8 (eight) weeks.   . budesonide-formoterol (SYMBICORT) 80-4.5 MCG/ACT inhaler Inhale 2 puffs into the lungs 2 (two) times daily.  Marland Kitchen BYDUREON 2 MG PEN once a week.   . carvedilol (COREG) 3.125 MG tablet Take 1 tablet (3.125 mg total) by mouth 2 (two) times daily with a meal.  . dicyclomine (BENTYL) 10 MG capsule TAKE ONE CAPSULE BY MOUTH PRIOR TO MEALSAS NEEDED. NO MORE THAN 4 TIMES DAILY. MAY CAUSE DROWINESS.  Marland Kitchen doxazosin (CARDURA) 4 MG tablet Take 4 mg by mouth daily.  Marland Kitchen EPINEPHrine (EPIPEN 2-PAK) 0.3 mg/0.3 mL IJ SOAJ injection Inject 0.3 mg into the muscle daily as needed (allergic reaction).  . fluticasone (FLONASE) 50 MCG/ACT nasal spray Place 1 spray into both nostrils daily as needed for allergies or rhinitis.  . furosemide (LASIX) 40 MG tablet 2 (two) times daily.   Marland Kitchen HUMALOG KWIKPEN 100 UNIT/ML KiwkPen   . Insulin Glargine (LANTUS) 100 UNIT/ML Solostar Pen Inject 26 Units into the skin every  morning.   . Insulin Lispro (HUMALOG KWIKPEN [place]) Inject 6 Units into the skin 2 (two) times daily before a meal.   . insulin lispro (HUMALOG) 100 UNIT/ML KwikPen   . ipratropium-albuterol (DUONEB) 0.5-2.5 (3) MG/3ML SOLN Take 3 mLs by nebulization every 4 (four) hours. (Patient taking differently: Take 3 mLs by nebulization every 6 (six) hours as needed (wheezing and shortness of breath). )  . LORazepam (ATIVAN) 1 MG tablet Take 1 mg by mouth 2 (two) times daily as needed for anxiety or sleep.   . magnesium oxide (MAG-OX) 400 MG tablet Take 400 mg by mouth daily.  . mometasone-formoterol (DULERA) 100-5 MCG/ACT AERO Inhale 2 puffs into the lungs 2 (two) times daily.  . montelukast (SINGULAIR) 10 MG tablet TAKE ONE TABLET BY MOUTH EVERY NIGHT AT BEDTIME (Patient taking differently: Take 10 mg by mouth at bedtime. )  . olmesartan (BENICAR) 40 MG  tablet Take 40 mg by mouth daily.   . pantoprazole (PROTONIX) 40 MG tablet TAKE ONE TABLET BY MOUTH TWICE A DAY BEFORE A MEAL  . sertraline (ZOLOFT) 50 MG tablet Take 50 mg by mouth daily.   Marland Kitchen Spacer/Aero-Holding Chambers (AEROCHAMBER MV) inhaler Use as instructed  . SPIRIVA HANDIHALER 18 MCG inhalation capsule   . temazepam (RESTORIL) 15 MG capsule Take 15 mg by mouth at bedtime as needed for sleep.  . vitamin C (ASCORBIC ACID) 500 MG tablet Take 500 mg by mouth daily.      Family History  Problem Relation Age of Onset  . Colon cancer Mother   . Heart attack Father   . Heart attack Sister   . Stroke Sister   . Cancer - Lung Sister     Social History   Socioeconomic History  . Marital status: Married    Spouse name: Not on file  . Number of children: Not on file  . Years of education: Not on file  . Highest education level: Not on file  Occupational History  . Not on file  Social Needs  . Financial resource strain: Not on file  . Food insecurity:    Worry: Not on file    Inability: Not on file  . Transportation needs:    Medical: Not on  file    Non-medical: Not on file  Tobacco Use  . Smoking status: Never Smoker  . Smokeless tobacco: Never Used  Substance and Sexual Activity  . Alcohol use: No  . Drug use: No  . Sexual activity: Yes    Birth control/protection: Surgical  Lifestyle  . Physical activity:    Days per week: Not on file    Minutes per session: Not on file  . Stress: Not on file  Relationships  . Social connections:    Talks on phone: Not on file    Gets together: Not on file    Attends religious service: Not on file    Active member of club or organization: Not on file    Attends meetings of clubs or organizations: Not on file    Relationship status: Not on file  Other Topics Concern  . Not on file  Social History Narrative  . Not on file       Review of Systems: Gen: Denies fever, chills, anorexia. Denies fatigue, weakness, weight loss.  CV: Denies chest pain, palpitations, syncope, peripheral edema, and claudication. Resp: Denies dyspnea at rest, cough, wheezing, coughing up blood, and pleurisy. GI: see HPI Derm: Denies rash, itching, dry skin Psych: Denies depression, anxiety, memory loss, confusion. No homicidal or suicidal ideation.  Heme: see HPI  Observations/Objective: No distress. Unable to perform physical exam due to telephone encounter. No video available.   Assessment and Plan: 73 year old female with diarrhea for at least 6 months, with last colonoscopy in 2016 with polyps. This has been persistent with some mild improvement with Bentyl but not taking up to 4 times a day. Doubt infectious process as this is now chronic, with intermittent episodes and normal BMs in between. No hematochezia but has noted darker stool despite stopping iron.   Dysphagia: chronic. Last EGD in remote past. Continue Protonix BID and will arrange EGD with dilatation.  Normocytic anemia: noted late last year and requiring iron infusions. Multifactorial in setting of CKD and IDA. As this is newer  onset, will pursue colonoscopy with EGD/dilatation. Continue to see Hematology.   Proceed with TCS/EGD/dilatation with Dr. Gala Romney in near  future: the risks, benefits, and alternatives have been discussed with the patient in detail. The patient states understanding and desires to proceed. Propofol due to multiple health issues Bentyl QID and in evening as needed Protonix BID Continue to hold iron Close follow-up in 6-8 weeks  Follow Up Instructions:    I discussed the assessment and treatment plan with the patient. The patient was provided an opportunity to ask questions and all were answered. The patient agreed with the plan and demonstrated an understanding of the instructions.   The patient was advised to call back or seek an in-person evaluation if the symptoms worsen or if the condition fails to improve as anticipated.  I provided 30 minutes of non-face-to-face time during this encounter.  Annitta Needs, PhD, ANP-BC [facility]

## 2019-05-27 NOTE — Telephone Encounter (Signed)
Per AB schedule patient for procedure on 6/8.  Called patient and is aware procedure time on 6/8 is at 12:30pm. She will have to have pre-op appt and they will inform her time of arrival. She is also aware she will need to go for COVID-19 testing and will have to stay quarantined at home until after procedure. She voiced understanding. Aware endo scheduler will call her with pre-op appt and date/time for COVID-19 testing. Rx has been sent to [facility] pharmacy for prep along with instructions. Patient aware if they do not provide her the instructions we faxed to stop by our office to pick this up. She voiced understanding. Orders entered. LMOVM for endo scheduling making aware patient added to schedule.

## 2019-05-27 NOTE — Patient Instructions (Addendum)
We have arranged a colonoscopy, upper endoscopy, and dilatation with Dr. Gala Romney in the near future.  Take only 1/2 dose of Lantus the evening before the procedure. No insulin (Lantus or Humalog) the day of the procedure.   Continue Protonix twice a day, 30 minutes before breakfast and dinner.   Continue to hold iron.  Continue Bentyl up to four times a day if needed.  Call with any severe abdominal pain, associated with fever/chills, nausea, vomiting.   Office visit in 6-8 weeks.  It was a pleasure to talk to you today. I strive to create trusting relationships with patients to provide genuine, compassionate, and quality care. I value your feedback. If you receive a survey regarding your visit,  I greatly appreciate you taking time to fill this out.   Annitta Needs, PhD, ANP-BC [facility]

## 2019-05-28 ENCOUNTER — Encounter: Payer: Self-pay | Admitting: Internal Medicine

## 2019-05-29 ENCOUNTER — Encounter (HOSPITAL_COMMUNITY)
Admission: RE | Admit: 2019-05-29 | Discharge: 2019-05-29 | Disposition: A | Discharge status: Home / Self Care | Payer: Medicare Other | Source: Ambulatory Visit | Attending: Internal Medicine | Admitting: Internal Medicine

## 2019-05-29 ENCOUNTER — Other Ambulatory Visit: Payer: Self-pay

## 2019-05-29 ENCOUNTER — Other Ambulatory Visit (HOSPITAL_COMMUNITY)
Admission: RE | Admit: 2019-05-29 | Discharge: 2019-05-29 | Disposition: A | Discharge status: Home / Self Care | Payer: Medicare Other | Source: Ambulatory Visit | Attending: Internal Medicine | Admitting: Internal Medicine

## 2019-05-29 ENCOUNTER — Encounter (HOSPITAL_COMMUNITY): Payer: Self-pay

## 2019-05-29 DIAGNOSIS — Z1159 Encounter for screening for other viral diseases: Secondary | ICD-10-CM | POA: Diagnosis not present

## 2019-05-29 DIAGNOSIS — Z01812 Encounter for preprocedural laboratory examination: Secondary | ICD-10-CM | POA: Insufficient documentation

## 2019-05-29 LAB — SARS CORONAVIRUS 2 BY RT PCR (HOSPITAL ORDER, PERFORMED IN ~~LOC~~ HOSPITAL LAB): SARS Coronavirus 2: NEGATIVE

## 2019-06-01 ENCOUNTER — Ambulatory Visit (HOSPITAL_COMMUNITY): Payer: Medicare Other | Admitting: Anesthesiology

## 2019-06-01 ENCOUNTER — Encounter (HOSPITAL_COMMUNITY)
Admission: RE | Disposition: A | Discharge status: Home / Self Care | Payer: Self-pay | Source: Home / Self Care | Attending: Internal Medicine

## 2019-06-01 ENCOUNTER — Ambulatory Visit (HOSPITAL_COMMUNITY)
Admission: RE | Admit: 2019-06-01 | Discharge: 2019-06-01 | Disposition: A | Discharge status: Home / Self Care | Payer: Medicare Other | Attending: Internal Medicine | Admitting: Internal Medicine

## 2019-06-01 ENCOUNTER — Other Ambulatory Visit: Payer: Self-pay

## 2019-06-01 ENCOUNTER — Encounter (HOSPITAL_COMMUNITY): Payer: Self-pay | Admitting: *Deleted

## 2019-06-01 DIAGNOSIS — D631 Anemia in chronic kidney disease: Secondary | ICD-10-CM | POA: Diagnosis not present

## 2019-06-01 DIAGNOSIS — Z853 Personal history of malignant neoplasm of breast: Secondary | ICD-10-CM | POA: Insufficient documentation

## 2019-06-01 DIAGNOSIS — K5730 Diverticulosis of large intestine without perforation or abscess without bleeding: Secondary | ICD-10-CM | POA: Diagnosis not present

## 2019-06-01 DIAGNOSIS — D509 Iron deficiency anemia, unspecified: Secondary | ICD-10-CM | POA: Diagnosis not present

## 2019-06-01 DIAGNOSIS — D120 Benign neoplasm of cecum: Secondary | ICD-10-CM | POA: Diagnosis not present

## 2019-06-01 DIAGNOSIS — Z79899 Other long term (current) drug therapy: Secondary | ICD-10-CM | POA: Insufficient documentation

## 2019-06-01 DIAGNOSIS — J45909 Unspecified asthma, uncomplicated: Secondary | ICD-10-CM | POA: Diagnosis not present

## 2019-06-01 DIAGNOSIS — I509 Heart failure, unspecified: Secondary | ICD-10-CM | POA: Insufficient documentation

## 2019-06-01 DIAGNOSIS — D123 Benign neoplasm of transverse colon: Secondary | ICD-10-CM | POA: Insufficient documentation

## 2019-06-01 DIAGNOSIS — K589 Irritable bowel syndrome without diarrhea: Secondary | ICD-10-CM | POA: Diagnosis not present

## 2019-06-01 DIAGNOSIS — K529 Noninfective gastroenteritis and colitis, unspecified: Secondary | ICD-10-CM | POA: Insufficient documentation

## 2019-06-01 DIAGNOSIS — K635 Polyp of colon: Secondary | ICD-10-CM

## 2019-06-01 DIAGNOSIS — F329 Major depressive disorder, single episode, unspecified: Secondary | ICD-10-CM | POA: Diagnosis not present

## 2019-06-01 DIAGNOSIS — Z86010 Personal history of colonic polyps: Secondary | ICD-10-CM | POA: Diagnosis not present

## 2019-06-01 DIAGNOSIS — Z9981 Dependence on supplemental oxygen: Secondary | ICD-10-CM | POA: Insufficient documentation

## 2019-06-01 DIAGNOSIS — R1310 Dysphagia, unspecified: Secondary | ICD-10-CM | POA: Diagnosis not present

## 2019-06-01 DIAGNOSIS — E785 Hyperlipidemia, unspecified: Secondary | ICD-10-CM | POA: Insufficient documentation

## 2019-06-01 DIAGNOSIS — I130 Hypertensive heart and chronic kidney disease with heart failure and stage 1 through stage 4 chronic kidney disease, or unspecified chronic kidney disease: Secondary | ICD-10-CM | POA: Insufficient documentation

## 2019-06-01 DIAGNOSIS — K3189 Other diseases of stomach and duodenum: Secondary | ICD-10-CM

## 2019-06-01 DIAGNOSIS — Z794 Long term (current) use of insulin: Secondary | ICD-10-CM | POA: Diagnosis not present

## 2019-06-01 DIAGNOSIS — J449 Chronic obstructive pulmonary disease, unspecified: Secondary | ICD-10-CM | POA: Diagnosis not present

## 2019-06-01 DIAGNOSIS — N189 Chronic kidney disease, unspecified: Secondary | ICD-10-CM | POA: Diagnosis not present

## 2019-06-01 DIAGNOSIS — K921 Melena: Secondary | ICD-10-CM

## 2019-06-01 DIAGNOSIS — E1122 Type 2 diabetes mellitus with diabetic chronic kidney disease: Secondary | ICD-10-CM | POA: Diagnosis not present

## 2019-06-01 DIAGNOSIS — D649 Anemia, unspecified: Secondary | ICD-10-CM

## 2019-06-01 DIAGNOSIS — K219 Gastro-esophageal reflux disease without esophagitis: Secondary | ICD-10-CM | POA: Diagnosis not present

## 2019-06-01 HISTORY — PX: BIOPSY: SHX5522

## 2019-06-01 HISTORY — PX: MALONEY DILATION: SHX5535

## 2019-06-01 HISTORY — PX: COLONOSCOPY WITH PROPOFOL: SHX5780

## 2019-06-01 HISTORY — PX: POLYPECTOMY: SHX5525

## 2019-06-01 HISTORY — PX: ESOPHAGOGASTRODUODENOSCOPY (EGD) WITH PROPOFOL: SHX5813

## 2019-06-01 LAB — GLUCOSE, CAPILLARY
Glucose-Capillary: 89 mg/dL (ref 70–99)
Glucose-Capillary: 78 mg/dL (ref 70–99)

## 2019-06-01 SURGERY — COLONOSCOPY WITH PROPOFOL
Anesthesia: General

## 2019-06-01 MED ORDER — CHLORHEXIDINE GLUCONATE CLOTH 2 % EX PADS: 6.0000 | MEDICATED_PAD | Freq: Once | CUTANEOUS | Status: DC

## 2019-06-01 MED ORDER — PROMETHAZINE HCL 25 MG/ML IJ SOLN: 6.2500 mg | INTRAMUSCULAR | Status: DC | PRN

## 2019-06-01 MED ORDER — MIDAZOLAM HCL 2 MG/2ML IJ SOLN: 0.5000 mg | Freq: Once | INTRAMUSCULAR | Status: DC | PRN

## 2019-06-01 MED ORDER — GLYCOPYRROLATE 0.2 MG/ML IJ SOLN
INTRAMUSCULAR | Status: DC | PRN
  Administered 2019-06-01: 0.2 mg via INTRAVENOUS

## 2019-06-01 MED ORDER — LACTATED RINGERS IV SOLN
INTRAVENOUS | Status: DC
  Administered 2019-06-01: 13:00:00 via INTRAVENOUS

## 2019-06-01 MED ORDER — LIDOCAINE HCL (CARDIAC) PF 100 MG/5ML IV SOSY
PREFILLED_SYRINGE | INTRAVENOUS | Status: DC | PRN
  Administered 2019-06-01: 40 mg via INTRAVENOUS

## 2019-06-01 MED ORDER — KETAMINE HCL 50 MG/5ML IJ SOSY
PREFILLED_SYRINGE | INTRAMUSCULAR | Status: AC
  Filled 2019-06-01: qty 5

## 2019-06-01 MED ORDER — PROPOFOL 500 MG/50ML IV EMUL
INTRAVENOUS | Status: DC | PRN
  Administered 2019-06-01: 150 ug/kg/min via INTRAVENOUS
  Administered 2019-06-01 (×2): via INTRAVENOUS

## 2019-06-01 MED ORDER — PROPOFOL 10 MG/ML IV BOLUS
INTRAVENOUS | Status: DC | PRN
  Administered 2019-06-01: 20 mg via INTRAVENOUS

## 2019-06-01 MED ORDER — KETAMINE HCL 10 MG/ML IJ SOLN
INTRAMUSCULAR | Status: DC | PRN
  Administered 2019-06-01: 5 mg via INTRAVENOUS
  Administered 2019-06-01: 10 mg via INTRAVENOUS

## 2019-06-01 NOTE — Op Note (Signed)
[facility] Patient Name: Ann Fuller Procedure Date: 06/01/2019 12:01 PM MRN: [id number] Date of Birth: 19-May-1946 Attending MD: Norvel Richards , MD CSN: [id number] Age: 73 Admit Type: Outpatient Procedure:                Upper GI endoscopy Indications:              Dysphagia Providers:                Norvel Richards, MD, Janeece Riggers, RN, Raphael Gibney, Technician Referring MD:              Medicines:                Propofol per Anesthesia Complications:            No immediate complications. Estimated Blood Loss:     Estimated blood loss was minimal. Procedure:                Pre-Anesthesia Assessment:                           - Prior to the procedure, a History and Physical                            was performed, and patient medications and                            allergies were reviewed. The patient's tolerance of                            previous anesthesia was also reviewed. The risks                            and benefits of the procedure and the sedation                            options and risks were discussed with the patient.                            All questions were answered, and informed consent                            was obtained. Prior Anticoagulants: The patient has                            taken no previous anticoagulant or antiplatelet                            agents. ASA Grade Assessment: II - A patient with                            mild systemic disease. After reviewing the risks  and benefits, the patient was deemed in                            satisfactory condition to undergo the procedure.                           After obtaining informed consent, the endoscope was                            passed under direct vision. Throughout the                            procedure, the patient's blood pressure, pulse, and                            oxygen saturations were  monitored continuously. The                            [id number]-H190 ([id number]) scope was introduced through the                            mouth, and advanced to the second part of duodenum.                            The upper GI endoscopy was accomplished without                            difficulty. The patient tolerated the procedure                            well. Scope In: 12:00:52 PM Scope Out: 12:59:43 PM Total Procedure Duration: 0 hours 58 minutes 51 seconds  Findings:      The examined esophagus was normal.      Mucosal changes were found in the entire examined stomach. Minimal       polypoid mucosa. No ulcer or infiltrating process. Patent pylorus.      The duodenal bulb and second portion of the duodenum were normal. The       scope was withdrawn. Dilation was performed with a Maloney dilator with       mild resistance at 56 Fr. The dilation site was examined following       endoscope reinsertion and showed mild mucosal disruption. Estimated       blood loss was minimal. Impression:               - Normal esophagus. Dilated.                           -Minimal polypoid mucosa.                           - Normal duodenal bulb and second portion of the                            duodenum.                           -  No specimens collected. Moderate Sedation:      Moderate (conscious) sedation was personally administered by an       anesthesia professional. The following parameters were monitored: oxygen       saturation, heart rate, blood pressure, respiratory rate, EKG, adequacy       of pulmonary ventilation, and response to care. Recommendation:           - Patient has a contact number available for                            emergencies. The signs and symptoms of potential                            delayed complications were discussed with the                            patient. Return to normal activities tomorrow.                            Written discharge instructions  were provided to the                            patient.                           - Resume regular diet.                           - Continue present medications.                           - Await pathology results.                           - Repeat upper endoscopy (date not yet determined).                           - Return to GI clinic in 3 months. See colonoscopy                            report Procedure Code(s):        --- Professional ---                           [phone], Esophagogastroduodenoscopy, flexible,                            transoral; diagnostic, including collection of                            specimen(s) by brushing or washing, when performed                            (separate procedure)                           31517, Dilation of esophagus, by unguided sound or  bougie, single or multiple passes Diagnosis Code(s):        --- Professional ---                           K31.89, Other diseases of stomach and duodenum                           R13.10, Dysphagia, unspecified CPT copyright 2019 American Medical Association. All rights reserved. The codes documented in this report are preliminary and upon coder review may  be revised to meet current compliance requirements. Ann Fuller. [name], MD Norvel Richards, MD 06/01/2019 1:30:48 PM This report has been signed electronically. Number of Addenda: 0

## 2019-06-01 NOTE — Op Note (Signed)
[facility] Patient Name: Ann Fuller Procedure Date: 06/01/2019 1:04 PM MRN: [id number] Date of Birth: Feb 18, 1946 Attending MD: Norvel Richards , MD CSN: [id number] Age: 73 Admit Type: Outpatient Procedure:                Colonoscopy Indications:              Chronic diarrhea Providers:                Norvel Richards, MD, Janeece Riggers, RN, Raphael Gibney, Technician Referring MD:              Medicines:                Propofol per Anesthesia Complications:            No immediate complications. Estimated Blood Loss:     Estimated blood loss was minimal. Procedure:                Pre-Anesthesia Assessment:                           - Prior to the procedure, a History and Physical                            was performed, and patient medications and                            allergies were reviewed. The patient's tolerance of                            previous anesthesia was also reviewed. The risks                            and benefits of the procedure and the sedation                            options and risks were discussed with the patient.                            All questions were answered, and informed consent                            was obtained. Prior Anticoagulants: The patient has                            taken no previous anticoagulant or antiplatelet                            agents. ASA Grade Assessment: II - A patient with                            mild systemic disease. After reviewing the risks  and benefits, the patient was deemed in                            satisfactory condition to undergo the procedure.                           After obtaining informed consent, the colonoscope                            was passed under direct vision. Throughout the                            procedure, the patient's blood pressure, pulse, and                            oxygen saturations  were monitored continuously. The                            [id number]-[id number] ([id number]) scope was introduced through                            the anus and advanced to the 5 cm into the ileum. Scope In: 1:07:10 PM Scope Out: 1:27:38 PM Scope Withdrawal Time: 0 hours 14 minutes 4 seconds  Total Procedure Duration: 0 hours 20 minutes 28 seconds  Findings:      The perianal and digital rectal examinations were normal.      Scattered medium-mouthed diverticula were found in the sigmoid colon and       descending colon. Distal 5 cm of terminal ileum appeared normal.      Four semi-pedunculated polyps were found in the hepatic flexure and       cecum. The polyps were 4 to 5 mm in size. These polyps were removed with       a cold snare. Resection and retrieval were complete. Estimated blood       loss was minimal.      The exam was otherwise without abnormality on direct and retroflexion       views. Segmental biopsies of the right and left colon taken to evaluate       for chronic diarrhea. Impression:               - Diverticulosis in the sigmoid colon and in the                            descending colon.                           - Four 4 to 5 mm polyps at the hepatic flexure and                            in the cecum, removed with a cold snare. Resected                            and retrieved.                           -  The examination was otherwise normal on direct                            and retroflexion views. Moderate Sedation:      Moderate (conscious) sedation was personally administered by an       anesthesia professional. The following parameters were monitored: oxygen       saturation, heart rate, blood pressure, respiratory rate, EKG, adequacy       of pulmonary ventilation, and response to care.      See the other procedure note for documentation of moderate sedation with       intraservice time. Recommendation:           - Patient has a contact number available for                             emergencies. The signs and symptoms of potential                            delayed complications were discussed with the                            patient. Return to normal activities tomorrow.                            Written discharge instructions were provided to the                            patient.                           - Advance diet as tolerated.                           - Continue present medications.                           - Repeat colonoscopy date to be determined after                            pending pathology results are reviewed for                            surveillance based on pathology results.                           - Return to GI office in 3 months. Procedure Code(s):        --- Professional ---                           [phone], Colonoscopy, flexible; with removal of                            tumor(s), polyp(s), or other lesion(s) by snare                            technique  Diagnosis Code(s):        --- Professional ---                           K63.5, Polyp of colon                           K52.9, Noninfective gastroenteritis and colitis,                            unspecified                           K57.30, Diverticulosis of large intestine without                            perforation or abscess without bleeding CPT copyright 2019 American Medical Association. All rights reserved. The codes documented in this report are preliminary and upon coder review may  be revised to meet current compliance requirements. Ann Fuller. , MD Norvel Richards, MD 06/01/2019 1:35:57 PM This report has been signed electronically. Number of Addenda: 0

## 2019-06-01 NOTE — Anesthesia Preprocedure Evaluation (Signed)
Anesthesia Evaluation  Patient identified by MRN, date of birth, ID band Patient awake    Reviewed: Allergy & Precautions, NPO status , Patient's Chart, lab work & pertinent test results, reviewed documented beta blocker date and time   Airway Mallampati: III  TM Distance: >3 FB Neck ROM: Full    Dental no notable dental hx. (+) Poor Dentition   Pulmonary asthma , COPD,  COPD inhaler and oxygen dependent,    Pulmonary exam normal breath sounds clear to auscultation       Cardiovascular Exercise Tolerance: Poor hypertension, Pt. on medications and Pt. on home beta blockers +CHF  Normal cardiovascular examII+ Valvular Problems/Murmurs  Rhythm:Regular Rate:Normal     Neuro/Psych PSYCHIATRIC DISORDERS Depression negative neurological ROS     GI/Hepatic Neg liver ROS, GERD  Medicated and Controlled,  Endo/Other  negative endocrine ROSdiabetes, Type 1, Insulin Dependent  Renal/GU negative Renal ROS  negative genitourinary   Musculoskeletal  (+) Arthritis , Osteoarthritis,    Abdominal   Peds negative pediatric ROS (+)  Hematology negative hematology ROS (+) anemia ,   Anesthesia Other Findings   Reproductive/Obstetrics negative OB ROS                             Anesthesia Physical Anesthesia Plan  ASA: III  Anesthesia Plan: General   Post-op Pain Management:    Induction: Intravenous  PONV Risk Score and Plan:   Airway Management Planned: Nasal Cannula and Simple Face Mask  Additional Equipment:   Intra-op Plan:   Post-operative Plan:   Informed Consent: I have reviewed the patients History and Physical, chart, labs and discussed the procedure including the risks, benefits and alternatives for the proposed anesthesia with the patient or authorized representative who has indicated his/her understanding and acceptance.     Dental advisory given  Plan Discussed with:  CRNA  Anesthesia Plan Comments: (Plan full PPE use  Plan GA with GETA as needed )        Anesthesia Quick Evaluation

## 2019-06-01 NOTE — Transfer of Care (Signed)
Immediate Anesthesia Transfer of Care Note  Patient: Ann Fuller  Procedure(s) Performed: COLONOSCOPY WITH PROPOFOL (N/A ) ESOPHAGOGASTRODUODENOSCOPY (EGD) WITH PROPOFOL (N/A ) MALONEY DILATION (N/A ) POLYPECTOMY BIOPSY  Patient Location: PACU  Anesthesia Type:General  Level of Consciousness: awake, alert , oriented and patient cooperative  Airway & Oxygen Therapy: Patient Spontanous Breathing  Post-op Assessment: Report given to RN and Post -op Vital signs reviewed and stable  Post vital signs: Reviewed and stable  Last Vitals:  Vitals Value Taken Time  BP 126/52 06/01/2019  1:35 PM  Temp 36.4 C 06/01/2019  1:35 PM  Pulse 84 06/01/2019  1:37 PM  Resp 19 06/01/2019  1:35 PM  SpO2 100 % 06/01/2019  1:37 PM  Vitals shown include unvalidated device data.  Last Pain:  Vitals:   06/01/19 1218  TempSrc: Oral  PainSc:          Complications: No apparent anesthesia complications

## 2019-06-01 NOTE — Discharge Instructions (Signed)
Colonoscopy Discharge Instructions  Read the instructions outlined below and refer to this sheet in the next few weeks. These discharge instructions provide you with general information on caring for yourself after you leave the hospital. Your doctor may also give you specific instructions. While your treatment has been planned according to the most current medical practices available, unavoidable complications occasionally occur. If you have any problems or questions after discharge, call Dr. Gala Romney at [phone]. ACTIVITY  You may resume your regular activity, but move at a slower pace for the next 24 hours.   Take frequent rest periods for the next 24 hours.   Walking will help get rid of the air and reduce the bloated feeling in your belly (abdomen).   No driving for 24 hours (because of the medicine (anesthesia) used during the test).    Do not sign any important legal documents or operate any machinery for 24 hours (because of the anesthesia used during the test).  NUTRITION  Drink plenty of fluids.   You may resume your normal diet as instructed by your doctor.   Begin with a light meal and progress to your normal diet. Heavy or fried foods are harder to digest and may make you feel sick to your stomach (nauseated).   Avoid alcoholic beverages for 24 hours or as instructed.  MEDICATIONS  You may resume your normal medications unless your doctor tells you otherwise.  WHAT YOU CAN EXPECT TODAY  Some feelings of bloating in the abdomen.   Passage of more gas than usual.   Spotting of blood in your stool or on the toilet paper.  IF YOU HAD POLYPS REMOVED DURING THE COLONOSCOPY:  No aspirin products for 7 days or as instructed.   No alcohol for 7 days or as instructed.   Eat a soft diet for the next 24 hours.  FINDING OUT THE RESULTS OF YOUR TEST Not all test results are available during your visit. If your test results are not back during the visit, make an appointment  with your caregiver to find out the results. Do not assume everything is normal if you have not heard from your caregiver or the medical facility. It is important for you to follow up on all of your test results.  SEEK IMMEDIATE MEDICAL ATTENTION IF:  You have more than a spotting of blood in your stool.   Your belly is swollen (abdominal distention).   You are nauseated or vomiting.   You have a temperature over 101.   You have abdominal pain or discomfort that is severe or gets worse throughout the day.    EGD Discharge instructions Please read the instructions outlined below and refer to this sheet in the next few weeks. These discharge instructions provide you with general information on caring for yourself after you leave the hospital. Your doctor may also give you specific instructions. While your treatment has been planned according to the most current medical practices available, unavoidable complications occasionally occur. If you have any problems or questions after discharge, please call your doctor. ACTIVITY  You may resume your regular activity but move at a slower pace for the next 24 hours.   Take frequent rest periods for the next 24 hours.   Walking will help expel (get rid of) the air and reduce the bloated feeling in your abdomen.   No driving for 24 hours (because of the anesthesia (medicine) used during the test).   You may shower.   Do not sign  any important legal documents or operate any machinery for 24 hours (because of the anesthesia used during the test).  NUTRITION  Drink plenty of fluids.   You may resume your normal diet.   Begin with a light meal and progress to your normal diet.   Avoid alcoholic beverages for 24 hours or as instructed by your caregiver.  MEDICATIONS  You may resume your normal medications unless your caregiver tells you otherwise.  WHAT YOU CAN EXPECT TODAY  You may experience abdominal discomfort such as a feeling of  fullness or gas pains.  FOLLOW-UP  Your doctor will discuss the results of your test with you.  SEEK IMMEDIATE MEDICAL ATTENTION IF ANY OF THE FOLLOWING OCCUR:  Excessive nausea (feeling sick to your stomach) and/or vomiting.   Severe abdominal pain and distention (swelling).   Trouble swallowing.   Temperature over 101 F (37.8 C).   Rectal bleeding or vomiting of blood.   Colon polyp and diverticulosis information provided  Further recommendations to follow pending review of pathology report  At patient's request, I spoke to Jafus at [phone] and reviewed impression and recommendations.     Colon Polyps  Polyps are tissue growths inside the body. Polyps can grow in many places, including the large intestine (colon). A polyp may be a round bump or a mushroom-shaped growth. You could have one polyp or several. Most colon polyps are noncancerous (benign). However, some colon polyps can become cancerous over time. Finding and removing the polyps early can help prevent this. What are the causes? The exact cause of colon polyps is not known. What increases the risk? You are more likely to develop this condition if you:  Have a family history of colon cancer or colon polyps.  Are older than 24 or older than 45 if you are African American.  Have inflammatory bowel disease, such as ulcerative colitis or Crohn's disease.  Have certain hereditary conditions, such as: ? Familial adenomatous polyposis. ? Lynch syndrome. ? Turcot syndrome. ? Peutz-Jeghers syndrome.  Are overweight.  Smoke cigarettes.  Do not get enough exercise.  Drink too much alcohol.  Eat a diet that is high in fat and red meat and low in fiber.  Had childhood cancer that was treated with abdominal radiation. What are the signs or symptoms? Most polyps do not cause symptoms. If you have symptoms, they may include:  Blood coming from your rectum when having a bowel movement.  Blood in your  stool. The stool may look dark red or black.  Abdominal pain.  A change in bowel habits, such as constipation or diarrhea. How is this diagnosed? This condition is diagnosed with a colonoscopy. This is a procedure in which a lighted, flexible scope is inserted into the anus and then passed into the colon to examine the area. Polyps are sometimes found when a colonoscopy is done as part of routine cancer screening tests. How is this treated? Treatment for this condition involves removing any polyps that are found. Most polyps can be removed during a colonoscopy. Those polyps will then be tested for cancer. Additional treatment may be needed depending on the results of testing. Follow these instructions at home: Lifestyle  Maintain a healthy weight, or lose weight if recommended by your health care provider.  Exercise every day or as told by your health care provider.  Do not use any products that contain nicotine or tobacco, such as cigarettes and e-cigarettes. If you need help quitting, ask your health care provider.  If you drink alcohol, limit how much you have: ? 0-1 drink a day for women. ? 0-2 drinks a day for men.  Be aware of how much alcohol is in your drink. In the [place] one drink equals one 12 oz bottle of beer (355 mL), one 5 oz glass of wine (148 mL), or one 1 oz shot of hard liquor (44 mL). Eating and drinking   Eat foods that are high in fiber, such as fruits, vegetables, and whole grains.  Eat foods that are high in calcium and vitamin D, such as milk, cheese, yogurt, eggs, liver, fish, and broccoli.  Limit foods that are high in fat, such as fried foods and desserts.  Limit the amount of red meat and processed meat you eat, such as hot dogs, sausage, bacon, and lunch meats. General instructions  Keep all follow-up visits as told by your health care provider. This is important. ? This includes having regularly scheduled colonoscopies. ? Talk to your health care  provider about when you need a colonoscopy. Contact a health care provider if:  You have new or worsening bleeding during a bowel movement.  You have new or increased blood in your stool.  You have a change in bowel habits.  You lose weight for no known reason. Summary  Polyps are tissue growths inside the body. Polyps can grow in many places, including the colon.  Most colon polyps are noncancerous (benign), but some can become cancerous over time.  This condition is diagnosed with a colonoscopy.  Treatment for this condition involves removing any polyps that are found. Most polyps can be removed during a colonoscopy. This information is not intended to replace advice given to you by your health care provider. Make sure you discuss any questions you have with your health care provider. Document Released: 09/05/2004 Document Revised: 03/27/2018 Document Reviewed: 03/27/2018 Elsevier Interactive Patient Education  2019 [employer].     Diverticulosis  Diverticulosis is a condition that develops when small pouches (diverticula) form in the wall of the large intestine (colon). The colon is where water is absorbed and stool is formed. The pouches form when the inside layer of the colon pushes through weak spots in the outer layers of the colon. You may have a few pouches or many of them. What are the causes? The cause of this condition is not known. What increases the risk? The following factors may make you more likely to develop this condition:  Being older than age 63. Your risk for this condition increases with age. Diverticulosis is rare among people younger than age 62. By age 21, many people have it.  Eating a low-fiber diet.  Having frequent constipation.  Being overweight.  Not getting enough exercise.  Smoking.  Taking over-the-counter pain medicines, like aspirin and ibuprofen.  Having a family history of diverticulosis. What are the signs or symptoms? In  most people, there are no symptoms of this condition. If you do have symptoms, they may include:  Bloating.  Cramps in the abdomen.  Constipation or diarrhea.  Pain in the lower left side of the abdomen. How is this diagnosed? This condition is most often diagnosed during an exam for other colon problems. Because diverticulosis usually has no symptoms, it often cannot be diagnosed independently. This condition may be diagnosed by:  Using a flexible scope to examine the colon (colonoscopy).  Taking an X-ray of the colon after dye has been put into the colon (barium enema).  Doing a CT  scan. How is this treated? You may not need treatment for this condition if you have never developed an infection related to diverticulosis. If you have had an infection before, treatment may include:  Eating a high-fiber diet. This may include eating more fruits, vegetables, and grains.  Taking a fiber supplement.  Taking a live bacteria supplement (probiotic).  Taking medicine to relax your colon.  Taking antibiotic medicines. Follow these instructions at home:  Drink 6-8 glasses of water or more each day to prevent constipation.  Try not to strain when you have a bowel movement.  If you have had an infection before: ? Eat more fiber as directed by your health care provider or your diet and nutrition specialist (dietitian). ? Take a fiber supplement or probiotic, if your health care provider approves.  Take over-the-counter and prescription medicines only as told by your health care provider.  If you were prescribed an antibiotic, take it as told by your health care provider. Do not stop taking the antibiotic even if you start to feel better.  Keep all follow-up visits as told by your health care provider. This is important. Contact a health care provider if:  You have pain in your abdomen.  You have bloating.  You have cramps.  You have not had a bowel movement in 3 days. Get help  right away if:  Your pain gets worse.  Your bloating becomes very bad.  You have a fever or chills, and your symptoms suddenly get worse.  You vomit.  You have bowel movements that are bloody or black.  You have bleeding from your rectum. Summary  Diverticulosis is a condition that develops when small pouches (diverticula) form in the wall of the large intestine (colon).  You may have a few pouches or many of them.  This condition is most often diagnosed during an exam for other colon problems.  If you have had an infection related to diverticulosis, treatment may include increasing the fiber in your diet, taking supplements, or taking medicines. This information is not intended to replace advice given to you by your health care provider. Make sure you discuss any questions you have with your health care provider. Document Released: 09/06/2004 Document Revised: 10/29/2016 Document Reviewed: 10/29/2016 Elsevier Interactive Patient Education  2019 [place], Care After These instructions provide you with information about caring for yourself after your procedure. Your health care provider may also give you more specific instructions. Your treatment has been planned according to current medical practices, but problems sometimes occur. Call your health care provider if you have any problems or questions after your procedure. What can I expect after the procedure? After your procedure, you may:  Feel sleepy for several hours.  Feel clumsy and have poor balance for several hours.  Feel forgetful about what happened after the procedure.  Have poor judgment for several hours.  Feel nauseous or vomit.  Have a sore throat if you had a breathing tube during the procedure. Follow these instructions at home: For at least 24 hours after the procedure:      Have a responsible adult stay with you. It is important to have someone help care for you  until you are awake and alert.  Rest as needed.  Do not: ? Participate in activities in which you could fall or become injured. ? Drive. ? Use heavy machinery. ? Drink alcohol. ? Take sleeping pills or medicines that cause drowsiness. ? Make important  decisions or sign legal documents. ? Take care of children on your own. Eating and drinking  Follow the diet that is recommended by your health care provider.  If you vomit, drink water, juice, or soup when you can drink without vomiting.  Make sure you have little or no nausea before eating solid foods. General instructions  Take over-the-counter and prescription medicines only as told by your health care provider.  If you have sleep apnea, surgery and certain medicines can increase your risk for breathing problems. Follow instructions from your health care provider about wearing your sleep device: ? Anytime you are sleeping, including during daytime naps. ? While taking prescription pain medicines, sleeping medicines, or medicines that make you drowsy.  If you smoke, do not smoke without supervision.  Keep all follow-up visits as told by your health care provider. This is important. Contact a health care provider if:  You keep feeling nauseous or you keep vomiting.  You feel light-headed.  You develop a rash.  You have a fever. Get help right away if:  You have trouble breathing. Summary  For several hours after your procedure, you may feel sleepy and have poor judgment.  Have a responsible adult stay with you for at least 24 hours or until you are awake and alert. This information is not intended to replace advice given to you by your health care provider. Make sure you discuss any questions you have with your health care provider. Document Released: 04/01/2016 Document Revised: 07/26/2017 Document Reviewed: 04/01/2016 Elsevier Interactive Patient Education  2019 [employer].

## 2019-06-01 NOTE — Interval H&P Note (Signed)
History and Physical Interval Note:  06/01/2019 12:41 PM  Ann Fuller  has presented today for surgery, with the diagnosis of DYSPHAGIA, ANEMIA, MELENA.  The various methods of treatment have been discussed with the patient and family. After consideration of risks, benefits and other options for treatment, the patient has consented to  Procedure(s) with comments: COLONOSCOPY WITH PROPOFOL (N/A) - 12:30PM ESOPHAGOGASTRODUODENOSCOPY (EGD) WITH PROPOFOL (N/A) MALONEY DILATION (N/A) as a surgical intervention.  The patient's history has been reviewed, patient examined, no change in status, stable for surgery.  I have reviewed the patient's chart and labs.  Questions were answered to the patient's satisfaction.     Sharyon Peitz  No change.  EGD with ED and colonoscopy per plan.  The risks, benefits, limitations, imponderables and alternatives regarding both EGD and colonoscopy have been reviewed with the patient. Questions have been answered. All parties agreeable.

## 2019-06-01 NOTE — Anesthesia Postprocedure Evaluation (Signed)
Anesthesia Post Note  Patient: Ann Fuller  Procedure(s) Performed: COLONOSCOPY WITH PROPOFOL (N/A ) ESOPHAGOGASTRODUODENOSCOPY (EGD) WITH PROPOFOL (N/A ) MALONEY DILATION (N/A ) POLYPECTOMY BIOPSY  Anesthesia Type: General Level of consciousness: awake and alert and oriented Pain management: pain level controlled Vital Signs Assessment: post-procedure vital signs reviewed and stable Respiratory status: spontaneous breathing Cardiovascular status: stable Postop Assessment: no apparent nausea or vomiting Anesthetic complications: no     Last Vitals:  Vitals:   06/01/19 1218 06/01/19 1335  BP: (!) 159/62 (!) 126/52  Pulse: 67 87  Resp: (!) 23 19  Temp: 36.7 C (!) 36.4 C  SpO2: 100% 98%    Last Pain:  Vitals:   06/01/19 1218  TempSrc: Oral  PainSc:                  ADAMS, AMY A

## 2019-06-01 NOTE — Progress Notes (Signed)
CC'D TO PCP °

## 2019-06-03 ENCOUNTER — Encounter: Payer: Self-pay | Admitting: Internal Medicine

## 2019-06-08 ENCOUNTER — Ambulatory Visit: Payer: Medicare Other | Admitting: Nurse Practitioner

## 2019-06-09 ENCOUNTER — Encounter (HOSPITAL_COMMUNITY): Payer: Self-pay | Admitting: Internal Medicine

## 2019-07-14 ENCOUNTER — Telehealth: Payer: Self-pay

## 2019-07-14 DIAGNOSIS — R197 Diarrhea, unspecified: Secondary | ICD-10-CM

## 2019-07-14 NOTE — Telephone Encounter (Signed)
Received a VM from Brooks an Therapist, sports from [place] [facility]. Pt explained to him that she was having episodues of diarrhea that will last for 2-3 days at a time. When pt has diarrhea, she has 7-8 episodes in one day with abdominal pain, cramping, chills and nausea. When pt follows the Brat diet, her symptoms will improve. Spoke with pt and her diarrhea isn't watery until the end. When pt eats foods that are fried for example fried fish, her symptoms start back. Pt says she doesn't vomit but she gags and feels nauseated when she has that much diarrhea.pt has and EGD and TCS 06/01/2019. Pt is beginning to think she may need antibiotics to treat her symptoms. Please advise in the absence of AB.

## 2019-07-15 ENCOUNTER — Other Ambulatory Visit: Payer: Self-pay

## 2019-07-15 DIAGNOSIS — R197 Diarrhea, unspecified: Secondary | ICD-10-CM

## 2019-07-15 NOTE — Telephone Encounter (Signed)
Avoid trigger foods (such as fried foods). Let's check a GI path panel and CDiff given recent healthcare exposure. We will call with results and further recommendations.  Will CC: AB for FYI and further recommendations.

## 2019-07-15 NOTE — Addendum Note (Signed)
Addended by: Gordy Levan, ERIC A on: 07/15/2019 04:28 PM   Modules accepted: Orders

## 2019-07-15 NOTE — Telephone Encounter (Signed)
Pt notified of results. Pt will have stool studies checked.

## 2019-07-24 LAB — CLOSTRIDIUM DIFFICILE EIA: C difficile Toxins A+B, EIA: NEGATIVE

## 2019-07-26 LAB — GI PROFILE, STOOL, PCR

## 2019-08-03 NOTE — Progress Notes (Signed)
We need to have patient come back for an appt face to face. Hasn't been seen recently due to [place] and did virtual visit.

## 2019-08-05 NOTE — Progress Notes (Signed)
PATIENT COMING IN FOR VISIT 08/12/19

## 2019-08-12 ENCOUNTER — Encounter: Payer: Self-pay | Admitting: Nurse Practitioner

## 2019-08-12 ENCOUNTER — Other Ambulatory Visit: Payer: Self-pay

## 2019-08-12 ENCOUNTER — Ambulatory Visit: Payer: Medicare Other | Admitting: Nurse Practitioner

## 2019-08-12 VITALS — BP 126/58 | HR 92 | Temp 96.8°F | Ht 59.0 in | Wt 235.6 lb

## 2019-08-12 DIAGNOSIS — K580 Irritable bowel syndrome with diarrhea: Secondary | ICD-10-CM | POA: Diagnosis not present

## 2019-08-12 DIAGNOSIS — K219 Gastro-esophageal reflux disease without esophagitis: Secondary | ICD-10-CM | POA: Diagnosis not present

## 2019-08-12 NOTE — Assessment & Plan Note (Signed)
GERD seems to be doing well.  Recommend she continue her current medications and follow-up in 3 months.

## 2019-08-12 NOTE — Progress Notes (Signed)
Referring Provider: Asencion Noble, MD Primary Care Physician:  Asencion Noble, MD Primary GI:  Dr. Gala Romney  Chief Complaint  Patient presents with   Diarrhea    having constipation this week   Abdominal Pain    better, had been left side    HPI:   Ann Fuller is a 73 y.o. female who presents for follow-up on abdominal pain and diarrhea.  The patient was last seen in our office 05/27/2019 for IBS diarrhea, dysphasia, normocytic anemia.  Patient's last office visit was a virtual visit due to coronavirus/COVID-19 pandemic.  Previously seen in January 2020 and felt diarrhea was self-limiting however it persisted.  Diagnosed with normocytic anemia at the end of 2019 in the setting of CKD and relative iron deficiency state status post Feraheme x2 in April 2020.  History of multiple colon polyps.    At her last visit she complained of dysphasia since last year with solid foods intermittently.  Note she does not have many teeth and feels like this is contributing.  Also complained of intermittent abdominal pain exacerbated by certain foods such as popcorn and nuts, worse with stress and worry.  Pain is left lower quadrant sometimes left upper quadrant improved after complete "emptying of her intestines".  She is going about 4-5 times a day.  This typically starts as formed and it becomes liquid after that.  In between episodes will have soft, formed stool without pain.  Takes Bentyl twice daily and will increase as needed, Imodium on hand as needed for diarrhea.  He also noted black stools while on iron.  However, this reverted to black stools again even despite no iron.  Recommended EGD with possible dilation, colonoscopy, continue to see hematology.  Recommended continue Protonix twice daily, continue to hold iron, Bentyl up to 4 times a day as needed.  Follow-up in 6 to 8 weeks.  Colonoscopy completed 06/01/2019 which found diverticulosis in the sigmoid colon and descending colon, four 4 to 5 mm  polyps at the hepatic flexure and cecum, otherwise normal.  Status post random colon biopsies.  Surgical pathology found polyps to be tubular adenoma in the colon biopsies to be benign colonic mucosa without abnormality.  Recommended repeat colonoscopy in 3 years (2023).  EGD completed the same day found normal esophagus status post dilation, minimal polypoid mucosa, normal duodenum.  Recommended continue current medications and follow-up in 3 months.  Patient called our office 07/14/2019 and noted episodes of diarrhea for 2 to 3 days at a time with 7-8 episodes in a day along with cramping, chills, nausea, abdominal pain.  Symptoms improved with brat diet.  Also with some nausea but no vomiting.  Recommended avoid trigger foods, stool studies.  C. difficile and GI pathogen panel were both negative.  Recommended face-to-face office visit.  Today she states she's doing ok overall. Diarrhea has resolved, now having more constipation this week. Abdominal pain improved with cessation of diarrhea. Will typically take 1/2 Immodium for diarrhea, unless it's bad at which point she'll take a whole one. Interestingly, if she's having significant symptoms "a nerve pill" will help setting her stomach; notices symptoms worse when she's upset. She states when she was having a lot of diarrhea, it felt like she had a colon infection. Her last bowel movement was this morning, stool was hard with straining. Drinks about 2-3 bottles a day (16 oz bottles). Eats more fiber during the summer due to availability of fruits. She likes [place] and [place]. If  she needs to, she'll use MiraLAX for constipation; doesn't like anything too strong that will cause diarrhea and prefers adding water and fruit to help. Denies abdominal pain, N/V, hematochezia, melena, fever, chills, unintentional weight loss. Denies URI or flu-like symptoms. Denies loss of sense of taste or smell. Has COPD and wears 3 lpm oxygen per nasal cannula which is her  baseline. Denies chest pain, dizziness, lightheadedness, syncope, near syncope. Denies any other upper or lower GI symptoms.  Past Medical History:  Diagnosis Date   Adenomatous colon polyp    Arthritis    Asthma    Breast cancer ([place]) 1990;1995   Bulging lumbar disc    CHF (congestive heart failure) (HCC)    Depression    Diverticulitis    Dyspepsia    Essential hypertension, benign    GERD (gastroesophageal reflux disease)    Heart murmur    Hyperlipidemia    IBS (irritable bowel syndrome)    Internal hemorrhoid    Right shoulder pain    Tubular adenoma 11/2010   Type 2 diabetes mellitus [facility])     Past Surgical History:  Procedure Laterality Date   ABDOMINAL HYSTERECTOMY     APPENDECTOMY     BACK SURGERY  2007   Lumbar fusion   BIOPSY  06/01/2019   Procedure: BIOPSY;  Surgeon: Daneil Dolin, MD;  [facility];  Service: Endoscopy;;  colon   CATARACT EXTRACTION W/PHACO Left 08/27/2013   Procedure: CATARACT EXTRACTION PHACO AND INTRAOCULAR LENS PLACEMENT ([place]);  Surgeon: Tonny Branch, MD;  Location: [facility];  Service: Ophthalmology;  Laterality: Left;  CDE 4.25   CATARACT EXTRACTION W/PHACO Right 09/24/2013   Procedure: CATARACT EXTRACTION PHACO AND INTRAOCULAR LENS PLACEMENT ([facility]);  Surgeon: Tonny Branch, MD;  Location: [facility];  Service: Ophthalmology;  Laterality: Right;  CDE:  8.16   CHOLECYSTECTOMY     COLONOSCOPY  12/13/2010   Dr. Margarito Courser pancolonic diverticular.tubular adenoma   COLONOSCOPY  07/13/2003   COLONOSCOPY N/A 10/28/2015   ENI:DPOEUMP diverticulosis, multiple tubular adenomas removed. next tcs 10/2020.    COLONOSCOPY WITH PROPOFOL N/A 06/01/2019   Procedure: COLONOSCOPY WITH PROPOFOL;  Surgeon: Daneil Dolin, MD;  [facility];  Service: Endoscopy;  Laterality: N/A;  12:30PM   ESOPHAGOGASTRODUODENOSCOPY  12/13/2010   Dr. Jennet Maduro hernia, fundal gland type polyps   ESOPHAGOGASTRODUODENOSCOPY (EGD) WITH  PROPOFOL N/A 06/01/2019   Procedure: ESOPHAGOGASTRODUODENOSCOPY (EGD) WITH PROPOFOL;  Surgeon: Daneil Dolin, MD;  [facility];  Service: Endoscopy;  Laterality: N/A;   EYE SURGERY Left    Left KPE 08/27/13   MALONEY DILATION N/A 06/01/2019   Procedure: Venia Minks DILATION;  Surgeon: Daneil Dolin, MD;  [facility];  Service: Endoscopy;  Laterality: N/A;   MASTECTOMY Left 1995   NASAL ENDOSCOPY WITH EPISTAXIS CONTROL Left 07/16/2016   Procedure: ENDOSCOPIC LEFT NASAL CAUTERY;  Surgeon: Leta Baptist, MD;  Location: [facility];  Service: ENT;  Laterality: Left;   POLYPECTOMY  06/01/2019   Procedure: POLYPECTOMY;  Surgeon: Daneil Dolin, MD;  [facility];  Service: Endoscopy;;  colon   TONSILLECTOMY     TUBAL LIGATION      Current Outpatient Medications  Medication Sig Dispense Refill   acetaminophen (TYLENOL) 650 MG CR tablet Take 650 mg by mouth every 8 (eight) hours as needed for pain.     albuterol (PROVENTIL HFA;VENTOLIN HFA) 108 (90 Base) MCG/ACT inhaler Inhale 2 puffs into the lungs every 4 (four)  hours as needed for wheezing or shortness of breath.     amLODipine (NORVASC) 5 MG tablet Take 5 mg by mouth daily.     atorvastatin (LIPITOR) 40 MG tablet Take 40 mg by mouth at bedtime.      Benralizumab (FASENRA) 30 MG/ML SOSY Inject 30 mg into the skin every 8 (eight) weeks.      budesonide-formoterol (SYMBICORT) 80-4.5 MCG/ACT inhaler Inhale 2 puffs into the lungs 2 (two) times daily. 1 Inhaler 0   BYDUREON 2 MG PEN Inject 2 mg into the skin once a week. Tuesday     carvedilol (COREG) 3.125 MG tablet Take 1 tablet (3.125 mg total) by mouth 2 (two) times daily with a meal. 60 tablet 1   dicyclomine (BENTYL) 10 MG capsule TAKE ONE CAPSULE BY MOUTH PRIOR TO MEALSAS NEEDED. NO MORE THAN 4 TIMES DAILY. MAY CAUSE DROWINESS. (Patient taking differently: Take 10 mg by mouth 4 (four) times daily as needed for spasms. ) 120 capsule 3    doxazosin (CARDURA) 4 MG tablet Take 4 mg by mouth daily.     EPINEPHrine (EPIPEN 2-PAK) 0.3 mg/0.3 mL IJ SOAJ injection Inject 0.3 mg into the muscle daily as needed (allergic reaction).     fluticasone (FLONASE) 50 MCG/ACT nasal spray Place 1 spray into both nostrils daily as needed for allergies or rhinitis.     furosemide (LASIX) 40 MG tablet Take 40 mg by mouth 2 (two) times daily.      Insulin Glargine (LANTUS) 100 UNIT/ML Solostar Pen Inject 26 Units into the skin every morning.      Insulin Lispro (HUMALOG KWIKPEN [place]) Inject 6 Units into the skin 2 (two) times daily before a meal.      ipratropium-albuterol (DUONEB) 0.5-2.5 (3) MG/3ML SOLN Take 3 mLs by nebulization every 4 (four) hours. (Patient taking differently: Take 3 mLs by nebulization every 6 (six) hours as needed (wheezing and shortness of breath). ) 360 mL 6   loperamide (IMODIUM A-D) 2 MG tablet Take 1 mg by mouth every other day. Takes whole tablet if diarrhea is severe     LORazepam (ATIVAN) 1 MG tablet Take 1 mg by mouth 2 (two) times daily as needed for anxiety or sleep.      magnesium oxide (MAG-OX) 400 MG tablet Take 400 mg by mouth daily.     mometasone-formoterol (DULERA) 100-5 MCG/ACT AERO Inhale 2 puffs into the lungs 2 (two) times daily.     montelukast (SINGULAIR) 10 MG tablet TAKE ONE TABLET BY MOUTH EVERY NIGHT AT BEDTIME (Patient taking differently: Take 10 mg by mouth at bedtime. ) 30 tablet 5   olmesartan (BENICAR) 40 MG tablet Take 40 mg by mouth daily.     pantoprazole (PROTONIX) 40 MG tablet TAKE ONE TABLET BY MOUTH TWICE A DAY BEFORE A MEAL 180 tablet 1   sertraline (ZOLOFT) 50 MG tablet Take 50 mg by mouth daily.      Spacer/Aero-Holding Chambers (AEROCHAMBER MV) inhaler Use as instructed 1 each 0   SPIRIVA HANDIHALER 18 MCG inhalation capsule Place 18 mcg into inhaler and inhale daily.      temazepam (RESTORIL) 15 MG capsule Take 15 mg by mouth at bedtime as needed for sleep.     vitamin C  (ASCORBIC ACID) 500 MG tablet Take 500 mg by mouth daily.      No current facility-administered medications for this visit.     Allergies as of 08/12/2019 - Review Complete 08/12/2019  Allergen Reaction Noted  Codeine Swelling    Ceftin [cefuroxime axetil] Swelling 05/02/2012   Metformin and related Diarrhea 11/26/2012   Nexium [esomeprazole magnesium] Diarrhea 05/02/2012   Omnicef [cefdinir] Diarrhea 05/02/2012   Sulfa antibiotics Rash 05/02/2012    Family History  Problem Relation Age of Onset   Colon cancer Mother    Heart attack Father    Heart attack Sister    Stroke Sister    Cancer - Lung Sister     Social History   Socioeconomic History   Marital status: Married    Spouse name: Not on file   Number of children: Not on file   Years of education: Not on file   Highest education level: Not on file  Occupational History   Not on file  Social Needs   Financial resource strain: Not on file   Food insecurity    Worry: Not on file    Inability: Not on file   Transportation needs    Medical: Not on file    Non-medical: Not on file  Tobacco Use   Smoking status: Never Smoker   Smokeless tobacco: Never Used  Substance and Sexual Activity   Alcohol use: No   Drug use: No   Sexual activity: Yes    Birth control/protection: Surgical  Lifestyle   Physical activity    Days per week: Not on file    Minutes per session: Not on file   Stress: Not on file  Relationships   Social connections    Talks on phone: Not on file    Gets together: Not on file    Attends religious service: Not on file    Active member of club or organization: Not on file    Attends meetings of clubs or organizations: Not on file    Relationship status: Not on file  Other Topics Concern   Not on file  Social History Narrative   Not on file    Review of Systems: General: Negative for anorexia, weight loss, fever, chills, fatigue, weakness. ENT: Negative for  hoarseness, difficulty swallowing. CV: Negative for chest pain, angina, palpitations, peripheral edema.  Respiratory: Negative for dyspnea at rest, cough, sputum, wheezing.  GI: See history of present illness. Endo: Negative for unusual weight change.  Heme: Negative for bruising or bleeding. Allergy: Negative for rash or hives.   Physical Exam: BP (!) 126/58    Pulse 92    Temp (!) 96.8 F (36 C) (Temporal)    Ht 4\' 11"  (1.499 m)    Wt 235 lb 9.6 oz (106.9 kg)    BMI 47.59 kg/m  General:   Alert and oriented. Pleasant and cooperative. Well-nourished and well-developed.  Eyes:  Without icterus, sclera clear and conjunctiva pink.  Ears:  Normal auditory acuity. Cardiovascular:  S1, S2 present without murmurs appreciated. Extremities without clubbing or edema. Respiratory:  Diminished but clear to auscultation bilaterally. No wheezes, rales, or rhonchi. No distress.  Gastrointestinal:  +BS, soft, non-tender and non-distended. No HSM noted. No guarding or rebound. No masses appreciated.  Rectal:  Deferred  Musculoskalatal:  Symmetrical without gross deformities. Neurologic:  Alert and oriented x4;  grossly normal neurologically. Psych:  Alert and cooperative. Normal mood and affect. Heme/Lymph/Immune: No excessive bruising noted.    08/12/2019 2:32 PM   Disclaimer: This note was dictated with voice recognition software. Similar sounding words can inadvertently be transcribed and may not be corrected upon review.

## 2019-08-12 NOTE — Patient Instructions (Signed)
Your health issues we discussed today were:   Irritable bowel syndrome with constipation versus diarrhea: 1. When you are having constipation with hard stools and straining, consider taking Colace over-the-counter stool softener as needed 2. You can still use Imodium 1/2-1 dose as needed for any diarrhea 3. Call us if you have any worsening or severe symptoms  Overall I recommend:  1. Continue your other current medications 2. Call us if you have any questions or concerns 3. Follow-up in 3 months   Because of recent events of COVID-19 ("Coronavirus"), follow CDC recommendations:  Wash your hand frequently Avoid touching your face Stay away from people who are sick If you have symptoms such as fever, cough, shortness of breath then call your healthcare provider for further guidance If you are sick, STAY AT HOME unless otherwise directed by your healthcare provider. Follow directions from state and national officials regarding staying safe   At [facility] we value your feedback. You may receive a survey about your visit today. Please share your experience as we strive to create trusting relationships with our patients to provide genuine, compassionate, quality care.  We appreciate your understanding and patience as we review any laboratory studies, imaging, and other diagnostic tests that are ordered as we care for you. Our office policy is 5 business days for review of these results, and any emergent or urgent results are addressed in a timely manner for your best interest. If you do not hear from our office in 1 week, please contact us.   We also encourage the use of MyChart, which contains your medical information for your review as well. If you are not enrolled in this feature, an access code is on this after visit summary for your convenience. Thank you for allowing [place] to be involved in your care.  It was great to see you today!  I hope you have a great summer!!

## 2019-08-12 NOTE — Assessment & Plan Note (Signed)
Chronic history of IBS and her symptoms seem to be better when she is less stressed or worried.  Previously she was having significant diarrhea and was taking a half of an Imodium or, if significant symptoms, although Imodium.  This generally works well.  She is this week been having more constipation.  She is having daily bowel movements but they are hard and require straining.  She is worried about taking anything that could cause recurrent diarrhea.  Recommend she try adding Colace stool softener as needed.  Make sure she drinks adequate water.  Continue fiber intake.  Can still use Imodium as needed.  Follow-up in 3 months.

## 2019-08-13 ENCOUNTER — Inpatient Hospital Stay (HOSPITAL_COMMUNITY): Payer: Medicare Other | Attending: Hematology

## 2019-08-13 DIAGNOSIS — Z79899 Other long term (current) drug therapy: Secondary | ICD-10-CM | POA: Insufficient documentation

## 2019-08-13 DIAGNOSIS — Z923 Personal history of irradiation: Secondary | ICD-10-CM | POA: Diagnosis not present

## 2019-08-13 DIAGNOSIS — Z9221 Personal history of antineoplastic chemotherapy: Secondary | ICD-10-CM | POA: Insufficient documentation

## 2019-08-13 DIAGNOSIS — Z801 Family history of malignant neoplasm of trachea, bronchus and lung: Secondary | ICD-10-CM | POA: Diagnosis not present

## 2019-08-13 DIAGNOSIS — E538 Deficiency of other specified B group vitamins: Secondary | ICD-10-CM | POA: Diagnosis not present

## 2019-08-13 DIAGNOSIS — R0602 Shortness of breath: Secondary | ICD-10-CM | POA: Diagnosis not present

## 2019-08-13 DIAGNOSIS — Z800 Family history of malignant neoplasm of digestive organs: Secondary | ICD-10-CM | POA: Insufficient documentation

## 2019-08-13 DIAGNOSIS — Z90710 Acquired absence of both cervix and uterus: Secondary | ICD-10-CM | POA: Insufficient documentation

## 2019-08-13 DIAGNOSIS — D649 Anemia, unspecified: Secondary | ICD-10-CM

## 2019-08-13 DIAGNOSIS — Z853 Personal history of malignant neoplasm of breast: Secondary | ICD-10-CM | POA: Diagnosis not present

## 2019-08-13 DIAGNOSIS — I10 Essential (primary) hypertension: Secondary | ICD-10-CM | POA: Insufficient documentation

## 2019-08-13 DIAGNOSIS — D509 Iron deficiency anemia, unspecified: Secondary | ICD-10-CM | POA: Diagnosis present

## 2019-08-13 DIAGNOSIS — E119 Type 2 diabetes mellitus without complications: Secondary | ICD-10-CM | POA: Diagnosis not present

## 2019-08-13 DIAGNOSIS — F329 Major depressive disorder, single episode, unspecified: Secondary | ICD-10-CM | POA: Insufficient documentation

## 2019-08-13 DIAGNOSIS — G479 Sleep disorder, unspecified: Secondary | ICD-10-CM | POA: Insufficient documentation

## 2019-08-13 DIAGNOSIS — Z794 Long term (current) use of insulin: Secondary | ICD-10-CM | POA: Insufficient documentation

## 2019-08-13 DIAGNOSIS — Z9012 Acquired absence of left breast and nipple: Secondary | ICD-10-CM | POA: Diagnosis not present

## 2019-08-13 LAB — COMPREHENSIVE METABOLIC PANEL
ALT: 15 U/L (ref 0–44)
AST: 17 U/L (ref 15–41)
Albumin: 3.7 g/dL (ref 3.5–5.0)
Alkaline Phosphatase: 65 U/L (ref 38–126)
Anion gap: 8 (ref 5–15)
BUN: 27 mg/dL — ABNORMAL HIGH (ref 8–23)
CO2: 30 mmol/L (ref 22–32)
Calcium: 9 mg/dL (ref 8.9–10.3)
Chloride: 99 mmol/L (ref 98–111)
Creatinine, Ser: 1.12 mg/dL — ABNORMAL HIGH (ref 0.44–1.00)
GFR calc Af Amer: 57 mL/min — ABNORMAL LOW (ref >60)
GFR calc non Af Amer: 49 mL/min — ABNORMAL LOW (ref >60)
Glucose, Bld: 131 mg/dL — ABNORMAL HIGH (ref 70–99)
Potassium: 3.9 mmol/L (ref 3.5–5.1)
Sodium: 137 mmol/L (ref 135–145)
Total Bilirubin: 0.5 mg/dL (ref 0.3–1.2)
Total Protein: 7.7 g/dL (ref 6.5–8.1)

## 2019-08-13 LAB — CBC WITH DIFFERENTIAL/PLATELET
Abs Immature Granulocytes: 0.03 10*3/uL (ref 0.00–0.07)
Basophils Absolute: 0 10*3/uL (ref 0.0–0.1)
Basophils Relative: 0 %
Eosinophils Absolute: 0 10*3/uL (ref 0.0–0.5)
Eosinophils Relative: 0 %
HCT: 31.9 % — ABNORMAL LOW (ref 36.0–46.0)
Hemoglobin: 10.1 g/dL — ABNORMAL LOW (ref 12.0–15.0)
Immature Granulocytes: 0 %
Lymphocytes Relative: 15 %
Lymphs Abs: 1.3 10*3/uL (ref 0.7–4.0)
MCH: 31.3 pg (ref 26.0–34.0)
MCHC: 31.7 g/dL (ref 30.0–36.0)
MCV: 98.8 fL (ref 80.0–100.0)
Monocytes Absolute: 0.6 10*3/uL (ref 0.1–1.0)
Monocytes Relative: 7 %
Neutro Abs: 6.8 10*3/uL (ref 1.7–7.7)
Neutrophils Relative %: 78 %
Platelets: 233 10*3/uL (ref 150–400)
RBC: 3.23 MIL/uL — ABNORMAL LOW (ref 3.87–5.11)
RDW: 12.8 % (ref 11.5–15.5)
WBC: 8.7 10*3/uL (ref 4.0–10.5)
nRBC: 0 % (ref 0.0–0.2)

## 2019-08-13 LAB — IRON AND TIBC
Iron: 73 ug/dL (ref 28–170)
Saturation Ratios: 27 % (ref 10.4–31.8)
TIBC: 273 ug/dL (ref 250–450)
UIBC: 200 ug/dL

## 2019-08-13 LAB — FERRITIN: Ferritin: 497 ng/mL — ABNORMAL HIGH (ref 11–307)

## 2019-08-20 ENCOUNTER — Ambulatory Visit (HOSPITAL_COMMUNITY): Payer: Medicare Other | Admitting: Hematology

## 2019-08-20 ENCOUNTER — Other Ambulatory Visit: Payer: Self-pay | Admitting: Gastroenterology

## 2019-08-24 ENCOUNTER — Other Ambulatory Visit: Payer: Self-pay

## 2019-08-24 ENCOUNTER — Inpatient Hospital Stay (HOSPITAL_BASED_OUTPATIENT_CLINIC_OR_DEPARTMENT_OTHER): Payer: Medicare Other | Admitting: Hematology

## 2019-08-24 ENCOUNTER — Encounter (HOSPITAL_COMMUNITY): Payer: Self-pay | Admitting: Hematology

## 2019-08-24 VITALS — BP 111/41 | HR 74 | Temp 97.7°F | Resp 16 | Wt 237.5 lb

## 2019-08-24 DIAGNOSIS — D649 Anemia, unspecified: Secondary | ICD-10-CM

## 2019-08-24 DIAGNOSIS — D509 Iron deficiency anemia, unspecified: Secondary | ICD-10-CM | POA: Diagnosis not present

## 2019-08-24 NOTE — Patient Instructions (Addendum)
[place] at [facility] Discharge Instructions  You were seen today by Dr. Delton Coombes. He went over your recent lab results. He will see you back in 3 months for labs and follow up.  Please start taking over the counter B12 1066mcg daily.  Thank you for choosing [place] at [facility] to provide your oncology and hematology care.  To afford each patient quality time with our provider, please arrive at least 15 minutes before your scheduled appointment time.   If you have a lab appointment with the [facility] please come in thru the  Main Entrance and check in at the main information desk  You need to re-schedule your appointment should you arrive 10 or more minutes late.  We strive to give you quality time with our providers, and arriving late affects you and other patients whose appointments are after yours.  Also, if you no show three or more times for appointments you may be dismissed from the clinic at the providers discretion.     Again, thank you for choosing [facility].  Our hope is that these requests will decrease the amount of time that you wait before being seen by our physicians.       _____________________________________________________________  Should you have questions after your visit to [facility], please contact our office at [phone] between the hours of 8:00 a.m. and 4:30 p.m.  Voicemails left after 4:00 p.m. will not be returned until the following business day.  For prescription refill requests, have your pharmacy contact our office and allow 72 hours.    Cancer Center Support Programs:   > Cancer Support Group  2nd Tuesday of the month 1pm-2pm, Journey Room

## 2019-08-24 NOTE — Progress Notes (Signed)
[place], [place] [address]   CLINIC:  Medical Oncology/Hematology  PCP:  Asencion Noble, MD [address] [place] [address] [phone]   REASON FOR VISIT:  Follow-up for normocytic anemia    INTERVAL HISTORY:  Ms. Kniep 73 y.o. female seen for follow-up of iron deficiency anemia.  She had colonoscopy and endoscopy done reviewed.  She denies any bleeding per rectum or melena.  She continues to feel low energy levels.  Appetite is 100%.  Pain is reported as 0.  Shortness of breath on exertion was reported treatment.  Denies any fevers, night sweats or weight loss in the last 6 months.  Denies any recurrent infections or hospitalizations.  She is apparently not taking B12 tablet.    REVIEW OF SYSTEMS:  Review of Systems  Respiratory: Positive for shortness of breath.   Psychiatric/Behavioral: Positive for depression and sleep disturbance.  All other systems reviewed and are negative.    PAST MEDICAL/SURGICAL HISTORY:  Past Medical History:  Diagnosis Date  . Adenomatous colon polyp   . Arthritis   . Asthma   . Breast cancer ([facility]) [id number]  . Bulging lumbar disc   . CHF (congestive heart failure) ([facility])   . Depression   . Diverticulitis   . Dyspepsia   . Essential hypertension, benign   . GERD (gastroesophageal reflux disease)   . Heart murmur   . Hyperlipidemia   . IBS (irritable bowel syndrome)   . Internal hemorrhoid   . Right shoulder pain   . Tubular adenoma 11/2010  . Type 2 diabetes mellitus ([facility])    Past Surgical History:  Procedure Laterality Date  . ABDOMINAL HYSTERECTOMY    . APPENDECTOMY    . BACK SURGERY  2007   Lumbar fusion  . BIOPSY  06/01/2019   Procedure: BIOPSY;  Surgeon: Daneil Dolin, MD;  [facility];  Service: Endoscopy;;  colon  . CATARACT EXTRACTION W/PHACO Left 08/27/2013   Procedure: CATARACT EXTRACTION PHACO AND INTRAOCULAR LENS PLACEMENT (IOC);  Surgeon: Tonny Branch,  MD;  Location: [facility];  Service: Ophthalmology;  Laterality: Left;  CDE 4.25  . CATARACT EXTRACTION W/PHACO Right 09/24/2013   Procedure: CATARACT EXTRACTION PHACO AND INTRAOCULAR LENS PLACEMENT (IOC);  Surgeon: Tonny Branch, MD;  Location: [facility];  Service: Ophthalmology;  Laterality: Right;  CDE:  8.16  . CHOLECYSTECTOMY    . COLONOSCOPY  12/13/2010   Dr. Margarito Courser pancolonic diverticular.tubular adenoma  . COLONOSCOPY  07/13/2003  . COLONOSCOPY N/A 10/28/2015   [id number] diverticulosis, multiple tubular adenomas removed. next tcs 10/2020.   Marland Kitchen COLONOSCOPY WITH PROPOFOL N/A 06/01/2019   Procedure: COLONOSCOPY WITH PROPOFOL;  Surgeon: Daneil Dolin, MD;  [facility];  Service: Endoscopy;  Laterality: N/A;  12:30PM  . ESOPHAGOGASTRODUODENOSCOPY  12/13/2010   Dr. Jennet Maduro hernia, fundal gland type polyps  . ESOPHAGOGASTRODUODENOSCOPY (EGD) WITH PROPOFOL N/A 06/01/2019   Procedure: ESOPHAGOGASTRODUODENOSCOPY (EGD) WITH PROPOFOL;  Surgeon: Daneil Dolin, MD;  [facility];  Service: Endoscopy;  Laterality: N/A;  . EYE SURGERY Left    Left KPE 08/27/13  . MALONEY DILATION N/A 06/01/2019   Procedure: Venia Minks DILATION;  Surgeon: Daneil Dolin, MD;  [facility];  Service: Endoscopy;  Laterality: N/A;  . MASTECTOMY Left 1995  . NASAL ENDOSCOPY WITH EPISTAXIS CONTROL Left 07/16/2016   Procedure: ENDOSCOPIC LEFT NASAL CAUTERY;  Surgeon: Leta Baptist, MD;  Location: [facility];  Service: ENT;  Laterality: Left;  . POLYPECTOMY  06/01/2019   Procedure: POLYPECTOMY;  Surgeon: Daneil Dolin, MD;  [facility];  Service: Endoscopy;;  colon  . TONSILLECTOMY    . TUBAL LIGATION       SOCIAL HISTORY:  Social History   Socioeconomic History  . Marital status: Married    Spouse name: Not on file  . Number of children: Not on file  . Years of education: Not on file  . Highest education level: Not on file  Occupational History  . Not on file   Social Needs  . Financial resource strain: Not on file  . Food insecurity    Worry: Not on file    Inability: Not on file  . Transportation needs    Medical: Not on file    Non-medical: Not on file  Tobacco Use  . Smoking status: Never Smoker  . Smokeless tobacco: Never Used  Substance and Sexual Activity  . Alcohol use: No  . Drug use: No  . Sexual activity: Yes    Birth control/protection: Surgical  Lifestyle  . Physical activity    Days per week: Not on file    Minutes per session: Not on file  . Stress: Not on file  Relationships  . Social Herbalist on phone: Not on file    Gets together: Not on file    Attends religious service: Not on file    Active member of club or organization: Not on file    Attends meetings of clubs or organizations: Not on file    Relationship status: Not on file  . Intimate partner violence    Fear of current or ex partner: Not on file    Emotionally abused: Not on file    Physically abused: Not on file    Forced sexual activity: Not on file  Other Topics Concern  . Not on file  Social History Narrative  . Not on file    FAMILY HISTORY:  Family History  Problem Relation Age of Onset  . Colon cancer Mother   . Heart attack Father   . Heart attack Sister   . Stroke Sister   . Cancer - Lung Sister     CURRENT MEDICATIONS:  Outpatient Encounter Medications as of 08/24/2019  Medication Sig  . amLODipine (NORVASC) 5 MG tablet Take 5 mg by mouth daily.  Marland Kitchen atorvastatin (LIPITOR) 40 MG tablet Take 40 mg by mouth at bedtime.   . Benralizumab (FASENRA) 30 MG/ML SOSY Inject 30 mg into the skin every 8 (eight) weeks.   . budesonide-formoterol (SYMBICORT) 80-4.5 MCG/ACT inhaler Inhale 2 puffs into the lungs 2 (two) times daily.  Marland Kitchen BYDUREON 2 MG PEN Inject 2 mg into the skin once a week. Tuesday  . carvedilol (COREG) 3.125 MG tablet Take 1 tablet (3.125 mg total) by mouth 2 (two) times daily with a meal.  . dicyclomine (BENTYL) 10  MG capsule TAKE ONE CAPSULE BY MOUTH PRIOR TO MEALSAS NEEDED. NO MORE THAN 4 TIMES DAILY. MAY CAUSE DROWINESS. (Patient taking differently: Take 10 mg by mouth 4 (four) times daily as needed for spasms. )  . doxazosin (CARDURA) 4 MG tablet Take 4 mg by mouth daily.  . fluticasone (FLONASE) 50 MCG/ACT nasal spray Place 1 spray into both nostrils daily as needed for allergies or rhinitis.  . furosemide (LASIX) 40 MG tablet Take 40 mg by mouth 2 (two) times daily.   . Insulin Glargine (LANTUS) 100 UNIT/ML  Solostar Pen Inject 26 Units into the skin every morning.   . Insulin Lispro (HUMALOG KWIKPEN [place]) Inject 6 Units into the skin 2 (two) times daily before a meal.   . magnesium oxide (MAG-OX) 400 MG tablet Take 400 mg by mouth daily.  . mometasone-formoterol (DULERA) 100-5 MCG/ACT AERO Inhale 2 puffs into the lungs 2 (two) times daily.  . montelukast (SINGULAIR) 10 MG tablet TAKE ONE TABLET BY MOUTH EVERY NIGHT AT BEDTIME (Patient taking differently: Take 10 mg by mouth at bedtime. )  . olmesartan (BENICAR) 40 MG tablet Take 40 mg by mouth daily.  . pantoprazole (PROTONIX) 40 MG tablet TAKE ONE TABLET BY MOUTH TWICE A DAY BEFORE A MEAL  . sertraline (ZOLOFT) 50 MG tablet Take 50 mg by mouth daily.   Marland Kitchen Spacer/Aero-Holding Chambers (AEROCHAMBER MV) inhaler Use as instructed  . SPIRIVA HANDIHALER 18 MCG inhalation capsule Place 18 mcg into inhaler and inhale daily.   . temazepam (RESTORIL) 15 MG capsule Take 15 mg by mouth at bedtime as needed for sleep.  . vitamin C (ASCORBIC ACID) 500 MG tablet Take 500 mg by mouth daily.   Marland Kitchen acetaminophen (TYLENOL) 650 MG CR tablet Take 650 mg by mouth every 8 (eight) hours as needed for pain.  Marland Kitchen albuterol (PROVENTIL HFA;VENTOLIN HFA) 108 (90 Base) MCG/ACT inhaler Inhale 2 puffs into the lungs every 4 (four) hours as needed for wheezing or shortness of breath.  . EPINEPHrine (EPIPEN 2-PAK) 0.3 mg/0.3 mL IJ SOAJ injection Inject 0.3 mg into the muscle daily as needed  (allergic reaction).  Marland Kitchen ipratropium-albuterol (DUONEB) 0.5-2.5 (3) MG/3ML SOLN Take 3 mLs by nebulization every 4 (four) hours. (Patient not taking: Reported on 08/24/2019)  . loperamide (IMODIUM A-D) 2 MG tablet Take 1 mg by mouth every other day. Takes whole tablet if diarrhea is severe  . LORazepam (ATIVAN) 1 MG tablet Take 1 mg by mouth 2 (two) times daily as needed for anxiety or sleep.    No facility-administered encounter medications on file as of 08/24/2019.     ALLERGIES:  Allergies  Allergen Reactions  . Codeine Swelling    Tongue swells  . Ceftin [Cefuroxime Axetil] Swelling  . Metformin And Related Diarrhea  . Nexium [Esomeprazole Magnesium] Diarrhea  . Omnicef [Cefdinir] Diarrhea  . Sulfa Antibiotics Rash     PHYSICAL EXAM:  ECOG Performance status: 2  Vitals:   08/24/19 1619  BP: (!) 111/41  Pulse: 74  Resp: 16  Temp: 97.7 F (36.5 C)  SpO2: 98%   Filed Weights   08/24/19 1619  Weight: 237 lb 8 oz (107.7 kg)    Physical Exam Vitals signs reviewed.  Constitutional:      Appearance: Normal appearance.  Cardiovascular:     Rate and Rhythm: Normal rate and regular rhythm.     Heart sounds: Normal heart sounds.  Pulmonary:     Effort: Pulmonary effort is normal.     Breath sounds: Normal breath sounds.  Abdominal:     General: There is no distension.     Palpations: Abdomen is soft. There is no mass.  Musculoskeletal:        General: No swelling.  Skin:    General: Skin is warm.  Neurological:     General: No focal deficit present.     Mental Status: She is alert and oriented to person, place, and time.  Psychiatric:        Mood and Affect: Mood normal.  Behavior: Behavior normal.      LABORATORY DATA:  I have reviewed the labs as listed.  CBC    Component Value Date/Time   WBC 8.7 08/13/2019 1106   RBC 3.23 (L) 08/13/2019 1106   HGB 10.1 (L) 08/13/2019 1106   HGB 10.7 (L) 01/22/2017 1008   HCT 31.9 (L) 08/13/2019 1106   HCT 33.0  (L) 01/22/2017 1008   PLT 233 08/13/2019 1106   PLT 280 01/22/2017 1008   MCV 98.8 08/13/2019 1106   MCV 88 01/22/2017 1008   MCH 31.3 08/13/2019 1106   MCHC 31.7 08/13/2019 1106   RDW 12.8 08/13/2019 1106   RDW 14.4 01/22/2017 1008   LYMPHSABS 1.3 08/13/2019 1106   LYMPHSABS 2.7 01/22/2017 1008   MONOABS 0.6 08/13/2019 1106   EOSABS 0.0 08/13/2019 1106   EOSABS 0.3 01/22/2017 1008   BASOSABS 0.0 08/13/2019 1106   BASOSABS 0.0 01/22/2017 1008   CMP Latest Ref Rng & Units 08/13/2019 05/13/2019 03/11/2019  Glucose 70 - 99 mg/dL 131(H) 123(H) 117(H)  BUN 8 - 23 mg/dL 27(H) 24(H) 21  Creatinine 0.44 - 1.00 mg/dL 1.12(H) 1.16(H) 0.91  Sodium 135 - 145 mmol/L 137 141 137  Potassium 3.5 - 5.1 mmol/L 3.9 3.5 3.4(L)  Chloride 98 - 111 mmol/L 99 100 98  CO2 22 - 32 mmol/L 30 31 30   Calcium 8.9 - 10.3 mg/dL 9.0 9.2 9.1  Total Protein 6.5 - 8.1 g/dL 7.7 7.4 7.7  Total Bilirubin 0.3 - 1.2 mg/dL 0.5 0.5 0.4  Alkaline Phos 38 - 126 U/L 65 64 53  AST 15 - 41 U/L 17 19 16   ALT 0 - 44 U/L 15 19 12        DIAGNOSTIC IMAGING:  I have independently reviewed the scans and discussed with the patient.   I have reviewed Venita Lick LPN's note and agree with the documentation.  I personally performed a face-to-face visit, made revisions and my assessment and plan is as follows.    ASSESSMENT & PLAN:   Normocytic anemia 1.  Normocytic anemia: - Etiology CKD and relative iron deficiency state. - Colonoscopy on 06/01/2019 showed diverticulosis in the sigmoid and descending colon.  Four 4-5 mm polyps at the hepatic flexure and in the cecum, removed with a cold snare. - EGD on 06/01/2019 shows normal esophagus, normal duodenal bulb and second part of the duodenum. -Last Feraheme infusion on 03/30/2019 and 04/06/2019.  Patient did not experience any improvement in energy. - She denied any bleeding per rectum or melena. - We reviewed her blood work.  Hemoglobin was 10.1.  Ferritin was 497 and percent  saturation was 27. - We talked about possible role for erythropoiesis stimulating agents if her hemoglobin drops below 10.  We will reassess her labs in 3 months.  2.  B12 deficiency: -She is apparently stopped taking B12 a few months ago.  I have told her to restart on B12 tablets daily. -I plan to recheck B12 levels prior to next visit.  3.  History of left breast cancer: - Treated with lumpectomy and lymph node dissection in 1990.  She also received chemotherapy followed by radiation followed by 5 years of tamoxifen. -Recurrent left breast cancer in 1995, underwent mastectomy, breast implant followed by chemotherapy.  Total time spent is 25 minutes with more than 50% of the time spent face-to-face discussing surveillance plan, counseling and coordination of care.    Orders placed this encounter:  Orders Placed This Encounter  Procedures  . CBC  with Differential/Platelet  . Comprehensive metabolic panel  . Iron and TIBC  . Ferritin  . Folate  . Vitamin B12      Derek Jack, [place] [phone]

## 2019-08-24 NOTE — Assessment & Plan Note (Addendum)
1.  Normocytic anemia: - Etiology CKD and relative iron deficiency state. - Colonoscopy on 06/01/2019 showed diverticulosis in the sigmoid and descending colon.  Four 4-5 mm polyps at the hepatic flexure and in the cecum, removed with a cold snare. - EGD on 06/01/2019 shows normal esophagus, normal duodenal bulb and second part of the duodenum. -Last Feraheme infusion on 03/30/2019 and 04/06/2019.  Patient did not experience any improvement in energy. - She denied any bleeding per rectum or melena. - We reviewed her blood work.  Hemoglobin was 10.1.  Ferritin was 497 and percent saturation was 27. - We talked about possible role for erythropoiesis stimulating agents if her hemoglobin drops below 10.  We will reassess her labs in 3 months.  2.  B12 deficiency: -She is apparently stopped taking B12 a few months ago.  I have told her to restart on B12 tablets daily. -I plan to recheck B12 levels prior to next visit.  3.  History of left breast cancer: - Treated with lumpectomy and lymph node dissection in 1990.  She also received chemotherapy followed by radiation followed by 5 years of tamoxifen. -Recurrent left breast cancer in 1995, underwent mastectomy, breast implant followed by chemotherapy.

## 2019-10-19 ENCOUNTER — Other Ambulatory Visit: Payer: Self-pay | Admitting: Nurse Practitioner

## 2019-11-11 ENCOUNTER — Encounter: Payer: Self-pay | Admitting: Nurse Practitioner

## 2019-11-11 ENCOUNTER — Ambulatory Visit: Payer: Medicare Other | Admitting: Nurse Practitioner

## 2019-11-11 ENCOUNTER — Other Ambulatory Visit: Payer: Self-pay

## 2019-11-11 VITALS — BP 123/69 | HR 82 | Temp 96.6°F | Ht <= 58 in | Wt 237.8 lb

## 2019-11-11 DIAGNOSIS — K219 Gastro-esophageal reflux disease without esophagitis: Secondary | ICD-10-CM | POA: Diagnosis not present

## 2019-11-11 DIAGNOSIS — K580 Irritable bowel syndrome with diarrhea: Secondary | ICD-10-CM | POA: Diagnosis not present

## 2019-11-11 DIAGNOSIS — R195 Other fecal abnormalities: Secondary | ICD-10-CM | POA: Diagnosis not present

## 2019-11-11 NOTE — Patient Instructions (Addendum)
Your health issues we discussed today were:   GERD (reflux/heartburn): 1. As we discussed, the 2. You have burning in your chest and esophagus try taking Tums or Rolaids to see if this helps 3. Continue taking your acid blocker (Protonix) twice daily, as you have been. 4. You can use Tums and Rolaids as needed  Dark stools: 1. I will have you do a home stool test to check for blood 2. We will call you with the results 3. Further recommendations to follow  IBS with diarrhea: 1. As we discussed, take one half dose of Imodium every morning 2. If you are going to have a "large meal" take another half dose of Imodium before that meal 3. Let us know if you have any worsening or severe symptoms  Overall I recommend:  1. Return for follow-up in 3 months 2. Call us if you have any questions or concerns 3. Continue your other current medications   Because of recent events of COVID-19 ("Coronavirus"), follow CDC recommendations:  1. Wash your hand frequently 2. Avoid touching your face 3. Stay away from people who are sick 4. If you have symptoms such as fever, cough, shortness of breath then call your healthcare provider for further guidance 5. If you are sick, STAY AT HOME unless otherwise directed by your healthcare provider. 6. Follow directions from state and national officials regarding staying safe   At [facility] we value your feedback. You may receive a survey about your visit today. Please share your experience as we strive to create trusting relationships with our patients to provide genuine, compassionate, quality care.  We appreciate your understanding and patience as we review any laboratory studies, imaging, and other diagnostic tests that are ordered as we care for you. Our office policy is 5 business days for review of these results, and any emergent or urgent results are addressed in a timely manner for your best interest. If you do not hear from our office  in 1 week, please contact us.   We also encourage the use of MyChart, which contains your medical information for your review as well. If you are not enrolled in this feature, an access code is on this after visit summary for your convenience. Thank you for allowing [place] to be involved in your care.  It was great to see you today!  I hope you have a Happy Thanksgiving!!

## 2019-11-11 NOTE — Progress Notes (Signed)
Referring Provider: Asencion Noble, MD Primary Care Physician:  Asencion Noble, MD Primary GI:  Dr. Gala Romney  Chief Complaint  Patient presents with   Diarrhea   Gastroesophageal Reflux    HPI:   Ann Fuller is a 73 y.o. female who presents for follow-up on IBS and GERD.  The patient was last seen in our office 08/12/2019 for the same.  History of self-limiting but persistent diarrhea.  Diagnosed with normocytic anemia in 2019 in the setting of CKD and relative iron deficiency state status post Feraheme x2 in April 2020.  At a previous visit she was having solid food dysphagia, although she notes that she does not have any teeth and this is likely contributing.  She was due for colonoscopy as well.  Colonoscopy up-to-date 2020 with diverticulosis and 4 total polyps found to be tubular adenoma and random colon biopsies benign.  Recommended repeat colonoscopy in 2023.  EGD also recently on file 06/01/2019 with status post dilation, minimal polypoid mucosa and normal duodenum.  Recommended continue current medications and follow-up in 3 months.  With persistent diarrhea stool studies were ordered and found to be normal.  At her last visit diarrhea had resolved and now is more constipation.  Abdominal pain improved with cessation of diarrhea.  Typically uses one half Imodium for diarrhea.  If she is having significant symptoms a "nerve pill" will help settle her stomach and anticipates worsening when she is upset.  Drinks adequate water.  Does seem to drink minimally adequate amount of fiber.  COPD on 3 L of oxygen.  No other overt GI symptoms.  Recommended Colace as needed for constipation, Imodium as needed for diarrhea, follow-up in 3 months.  Today she states she's doing ok overall. GERD has gotten worse. She has COPD and Asthma, began having chest and throat burning and had this twice. EMS recommended Prilosec. May have been asthma or COPD versus anxiety attack. Only had burning twice, not daily.  Denies abdominal pain, N/V. She was spitting up phlegm. Denies hematochezia. Has dark stools (dark brown). Still with intermittent diarrhea, but at least once a week. Imodium helps but needs to take it regularly. Did have some constipation this week, but reverted to diarrhea. Out of 10 stools, she would expect 4 to be normal, 5 diarrhea, 1 constipation. Has been trying half an imodium. Denies URI or flu-like symptoms. Denies loss of sense of taste or smell. Denies chest pain, dyspnea, dizziness, lightheadedness, syncope, near syncope. Denies any other upper or lower GI symptoms. Denies chest pain, dyspnea, dizziness, lightheadedness, syncope, near syncope. Denies any other upper or lower GI symptoms.   Past Medical History:  Diagnosis Date   Adenomatous colon polyp    Arthritis    Asthma    Breast cancer ([place]) 1990;1995   Bulging lumbar disc    CHF (congestive heart failure) ([place])    Depression    Diverticulitis    Dyspepsia    Essential hypertension, benign    GERD (gastroesophageal reflux disease)    Heart murmur    Hyperlipidemia    IBS (irritable bowel syndrome)    Internal hemorrhoid    Right shoulder pain    Tubular adenoma 11/2010   Type 2 diabetes mellitus [facility])     Past Surgical History:  Procedure Laterality Date   ABDOMINAL HYSTERECTOMY     APPENDECTOMY     BACK SURGERY  2007   Lumbar fusion   BIOPSY  06/01/2019   Procedure: BIOPSY;  Surgeon:  Daneil Dolin, MD;  [facility];  Service: Endoscopy;;  colon   CATARACT EXTRACTION W/PHACO Left 08/27/2013   Procedure: CATARACT EXTRACTION PHACO AND INTRAOCULAR LENS PLACEMENT (IOC);  Surgeon: Tonny Branch, MD;  Location: [facility];  Service: Ophthalmology;  Laterality: Left;  CDE 4.25   CATARACT EXTRACTION W/PHACO Right 09/24/2013   Procedure: CATARACT EXTRACTION PHACO AND INTRAOCULAR LENS PLACEMENT (IOC);  Surgeon: Tonny Branch, MD;  Location: [facility];  Service: Ophthalmology;  Laterality: Right;  CDE:   8.16   CHOLECYSTECTOMY     COLONOSCOPY  12/13/2010   Dr. Margarito Courser pancolonic diverticular.tubular adenoma   COLONOSCOPY  07/13/2003   COLONOSCOPY N/A 10/28/2015   [id number] diverticulosis, multiple tubular adenomas removed. next tcs 10/2020.    COLONOSCOPY WITH PROPOFOL N/A 06/01/2019   Procedure: COLONOSCOPY WITH PROPOFOL;  Surgeon: Daneil Dolin, MD;  [facility];  Service: Endoscopy;  Laterality: N/A;  12:30PM   ESOPHAGOGASTRODUODENOSCOPY  12/13/2010   Dr. Jennet Maduro hernia, fundal gland type polyps   ESOPHAGOGASTRODUODENOSCOPY (EGD) WITH PROPOFOL N/A 06/01/2019   Procedure: ESOPHAGOGASTRODUODENOSCOPY (EGD) WITH PROPOFOL;  Surgeon: Daneil Dolin, MD;  [facility];  Service: Endoscopy;  Laterality: N/A;   EYE SURGERY Left    Left KPE 08/27/13   MALONEY DILATION N/A 06/01/2019   Procedure: Venia Minks DILATION;  Surgeon: Daneil Dolin, MD;  [facility];  Service: Endoscopy;  Laterality: N/A;   MASTECTOMY Left 1995   NASAL ENDOSCOPY WITH EPISTAXIS CONTROL Left 07/16/2016   Procedure: ENDOSCOPIC LEFT NASAL CAUTERY;  Surgeon: Leta Baptist, MD;  Location: [facility];  Service: ENT;  Laterality: Left;   POLYPECTOMY  06/01/2019   Procedure: POLYPECTOMY;  Surgeon: Daneil Dolin, MD;  [facility];  Service: Endoscopy;;  colon   TONSILLECTOMY     TUBAL LIGATION      Current Outpatient Medications  Medication Sig Dispense Refill   acetaminophen (TYLENOL) 650 MG CR tablet Take 650 mg by mouth every 8 (eight) hours as needed for pain.     albuterol (PROVENTIL HFA;VENTOLIN HFA) 108 (90 Base) MCG/ACT inhaler Inhale 2 puffs into the lungs every 4 (four) hours as needed for wheezing or shortness of breath.     amLODipine (NORVASC) 5 MG tablet Take 5 mg by mouth daily.     atorvastatin (LIPITOR) 40 MG tablet Take 40 mg by mouth at bedtime.      Benralizumab (FASENRA) 30 MG/ML SOSY Inject 30 mg into the skin every 8 (eight)  weeks.      budesonide-formoterol (SYMBICORT) 80-4.5 MCG/ACT inhaler Inhale 2 puffs into the lungs 2 (two) times daily. 1 Inhaler 0   BYDUREON 2 MG PEN Inject 2 mg into the skin once a week. Tuesday     carvedilol (COREG) 3.125 MG tablet Take 1 tablet (3.125 mg total) by mouth 2 (two) times daily with a meal. 60 tablet 1   dicyclomine (BENTYL) 10 MG capsule TAKE ONE CAPSULE BY MOUTH PRIOR TO MEALSAS NEEDED. NO MORE THAN FOUR TIMES DAILY. MAY CAUSE DROWINESS 120 capsule 3   doxazosin (CARDURA) 4 MG tablet Take 4 mg by mouth daily.     EPINEPHrine (EPIPEN 2-PAK) 0.3 mg/0.3 mL IJ SOAJ injection Inject 0.3 mg into the muscle daily as needed (allergic reaction).     fluticasone (FLONASE) 50 MCG/ACT nasal spray Place 1 spray into both nostrils daily as needed for allergies or rhinitis.     furosemide (LASIX) 40 MG tablet Take 40 mg by  mouth 2 (two) times daily.      Insulin Glargine (LANTUS) 100 UNIT/ML Solostar Pen Inject 26 Units into the skin every morning.      Insulin Lispro (HUMALOG KWIKPEN [place]) Inject 6 Units into the skin 2 (two) times daily before a meal.      ipratropium-albuterol (DUONEB) 0.5-2.5 (3) MG/3ML SOLN Take 3 mLs by nebulization every 4 (four) hours. 360 mL 6   loperamide (IMODIUM A-D) 2 MG tablet Take 1 mg by mouth every other day. Takes whole tablet if diarrhea is severe     LORazepam (ATIVAN) 1 MG tablet Take 1 mg by mouth 2 (two) times daily as needed for anxiety or sleep.      magnesium oxide (MAG-OX) 400 MG tablet Take 400 mg by mouth daily.     montelukast (SINGULAIR) 10 MG tablet TAKE ONE TABLET BY MOUTH EVERY NIGHT AT BEDTIME (Patient taking differently: Take 10 mg by mouth at bedtime. ) 30 tablet 5   olmesartan (BENICAR) 40 MG tablet Take 40 mg by mouth daily.     pantoprazole (PROTONIX) 40 MG tablet TAKE ONE TABLET BY MOUTH TWICE A DAY BEFORE A MEAL 180 tablet 1   sertraline (ZOLOFT) 50 MG tablet Take 50 mg by mouth daily.      Spacer/Aero-Holding  Chambers (AEROCHAMBER MV) inhaler Use as instructed 1 each 0   SPIRIVA HANDIHALER 18 MCG inhalation capsule Place 18 mcg into inhaler and inhale daily.      temazepam (RESTORIL) 15 MG capsule Take 15 mg by mouth at bedtime as needed for sleep.     vitamin C (ASCORBIC ACID) 500 MG tablet Take 500 mg by mouth daily.      mometasone-formoterol (DULERA) 100-5 MCG/ACT AERO Inhale 2 puffs into the lungs 2 (two) times daily.     No current facility-administered medications for this visit.     Allergies as of 11/11/2019 - Review Complete 11/11/2019  Allergen Reaction Noted   Codeine Swelling    Ceftin [cefuroxime axetil] Swelling 05/02/2012   Metformin and related Diarrhea 11/26/2012   Nexium [esomeprazole magnesium] Diarrhea 05/02/2012   Omnicef [cefdinir] Diarrhea 05/02/2012   Sulfa antibiotics Rash 05/02/2012    Family History  Problem Relation Age of Onset   Colon cancer Mother    Heart attack Father    Heart attack Sister    Stroke Sister    Cancer - Lung Sister     Social History   Socioeconomic History   Marital status: Married    Spouse name: Not on file   Number of children: Not on file   Years of education: Not on file   Highest education level: Not on file  Occupational History   Not on file  Social Needs   Financial resource strain: Not on file   Food insecurity    Worry: Not on file    Inability: Not on file   Transportation needs    Medical: Not on file    Non-medical: Not on file  Tobacco Use   Smoking status: Never Smoker   Smokeless tobacco: Never Used  Substance and Sexual Activity   Alcohol use: No   Drug use: No   Sexual activity: Yes    Birth control/protection: Surgical  Lifestyle   Physical activity    Days per week: Not on file    Minutes per session: Not on file   Stress: Not on file  Relationships   Social connections    Talks on phone: Not on file  Gets together: Not on file    Attends religious  service: Not on file    Active member of club or organization: Not on file    Attends meetings of clubs or organizations: Not on file    Relationship status: Not on file  Other Topics Concern   Not on file  Social History Narrative   Not on file    Review of Systems: General: Negative for anorexia, weight loss, fever, chills, fatigue, weakness. ENT: Negative for hoarseness, difficulty swallowing. CV: Negative for chest pain, angina, palpitations, peripheral edema.  Respiratory: Negative for dyspnea at rest, cough, sputum, wheezing.  GI: See history of present illness. MS: Negative for joint pain, low back pain.  Derm: Negative for rash or itching.  Endo: Negative for unusual weight change.  Heme: Negative for bruising or bleeding. Allergy: Negative for rash or hives.   Physical Exam: BP 123/69    Pulse 82    Temp (!) 96.6 F (35.9 C) (Temporal)    Ht 4\' 9"  (1.448 m)    Wt 237 lb 12.8 oz (107.9 kg)    BMI 51.46 kg/m  General:   Alert and oriented. Pleasant and cooperative. Well-nourished and well-developed.  Eyes:  Without icterus, sclera clear and conjunctiva pink.  Ears:  Normal auditory acuity. Cardiovascular:  S1, S2 present without murmurs appreciated. Extremities without clubbing or edema. Respiratory:  Clear to auscultation bilaterally. No wheezes, rales, or rhonchi. No distress.  Gastrointestinal:  +BS, soft, non-tender and non-distended. No HSM noted. No guarding or rebound. No masses appreciated.  Rectal:  Deferred  Musculoskalatal:  Symmetrical without gross deformities. Neurologic:  Alert and oriented x4;  grossly normal neurologically. Psych:  Alert and cooperative. Normal mood and affect. Heme/Lymph/Immune: No excessive bruising noted.    11/23/2019 6:37 PM   Disclaimer: This note was dictated with voice recognition software. Similar sounding words can inadvertently be transcribed and may not be corrected upon review.

## 2019-11-16 ENCOUNTER — Telehealth: Payer: Self-pay

## 2019-11-16 NOTE — Telephone Encounter (Signed)
Pt dropped off her IFOBT kit. 2 test were done but were able to be completed since there was too much stool in the container. Pt is aware of her instructions and will stop by to pick another kit up.

## 2019-11-16 NOTE — Telephone Encounter (Signed)
Noted  

## 2019-11-23 ENCOUNTER — Encounter: Payer: Self-pay | Admitting: Nurse Practitioner

## 2019-11-23 NOTE — Assessment & Plan Note (Signed)
Given her symptoms and description the patient likely has irritable bowel syndrome, predominantly diarrhea type although this with some constipation.  Interestingly, she notes worsening symptoms when she is upset and at times a "nerve pill" will help her symptoms.  About half her stools are diarrhea, half her normal to constipated.  Imodium does seem to provide some relief for her.  Previous stool testing and random colon biopsies on colonoscopy were both normal.  At this point I will have her take one half Imodium every morning, and before a "large meal" as she notes that she typically has diarrhea symptoms after a large meal.  Call with any significant worsening and follow-up in 3 months.

## 2019-11-23 NOTE — Assessment & Plan Note (Signed)
She feels her GERD symptoms are somewhat worsening, although this could very well be related to her anxiety or COPD/asthma.  At this point I feel she should continue her current PPI.  If she does have an episode of esophageal/throat burning, try Tums as needed.  If Tums does provide some relief then we can further adjust her PPI dosage for more optimization of therapy.  If it does not, then it is more likely anxiety or chronic respiratory issues I feel.  Call for any significant worsening and follow-up in 3 months otherwise.

## 2019-11-23 NOTE — Assessment & Plan Note (Signed)
The patient does note intermittent dark stools.  I will have her complete an iFOBT test to check for blood.  Her colonoscopy is up-to-date and is next due in 2023.  Notify us of any significant obvious bleeding and follow-up in 3 months.

## 2019-11-24 ENCOUNTER — Other Ambulatory Visit (HOSPITAL_COMMUNITY): Payer: Medicare Other

## 2019-11-25 ENCOUNTER — Encounter: Payer: Self-pay | Admitting: Internal Medicine

## 2019-11-25 ENCOUNTER — Inpatient Hospital Stay (HOSPITAL_COMMUNITY): Payer: Medicare Other | Attending: Hematology

## 2019-11-25 DIAGNOSIS — Z79899 Other long term (current) drug therapy: Secondary | ICD-10-CM | POA: Diagnosis not present

## 2019-11-25 DIAGNOSIS — Z8249 Family history of ischemic heart disease and other diseases of the circulatory system: Secondary | ICD-10-CM | POA: Insufficient documentation

## 2019-11-25 DIAGNOSIS — F329 Major depressive disorder, single episode, unspecified: Secondary | ICD-10-CM | POA: Insufficient documentation

## 2019-11-25 DIAGNOSIS — D649 Anemia, unspecified: Secondary | ICD-10-CM | POA: Diagnosis present

## 2019-11-25 DIAGNOSIS — E119 Type 2 diabetes mellitus without complications: Secondary | ICD-10-CM | POA: Diagnosis not present

## 2019-11-25 DIAGNOSIS — Z9012 Acquired absence of left breast and nipple: Secondary | ICD-10-CM | POA: Diagnosis not present

## 2019-11-25 DIAGNOSIS — Z7951 Long term (current) use of inhaled steroids: Secondary | ICD-10-CM | POA: Diagnosis not present

## 2019-11-25 DIAGNOSIS — K589 Irritable bowel syndrome without diarrhea: Secondary | ICD-10-CM | POA: Insufficient documentation

## 2019-11-25 DIAGNOSIS — I10 Essential (primary) hypertension: Secondary | ICD-10-CM | POA: Diagnosis not present

## 2019-11-25 DIAGNOSIS — Z794 Long term (current) use of insulin: Secondary | ICD-10-CM | POA: Insufficient documentation

## 2019-11-25 DIAGNOSIS — N189 Chronic kidney disease, unspecified: Secondary | ICD-10-CM | POA: Diagnosis not present

## 2019-11-25 DIAGNOSIS — Z853 Personal history of malignant neoplasm of breast: Secondary | ICD-10-CM | POA: Diagnosis not present

## 2019-11-25 DIAGNOSIS — E785 Hyperlipidemia, unspecified: Secondary | ICD-10-CM | POA: Diagnosis not present

## 2019-11-25 DIAGNOSIS — E538 Deficiency of other specified B group vitamins: Secondary | ICD-10-CM | POA: Diagnosis not present

## 2019-11-25 DIAGNOSIS — Z9882 Breast implant status: Secondary | ICD-10-CM | POA: Insufficient documentation

## 2019-11-25 DIAGNOSIS — Z800 Family history of malignant neoplasm of digestive organs: Secondary | ICD-10-CM | POA: Insufficient documentation

## 2019-11-25 DIAGNOSIS — Z801 Family history of malignant neoplasm of trachea, bronchus and lung: Secondary | ICD-10-CM | POA: Insufficient documentation

## 2019-11-25 LAB — COMPREHENSIVE METABOLIC PANEL
ALT: 19 U/L (ref 0–44)
AST: 21 U/L (ref 15–41)
Albumin: 4 g/dL (ref 3.5–5.0)
Alkaline Phosphatase: 63 U/L (ref 38–126)
Anion gap: 13 (ref 5–15)
BUN: 27 mg/dL — ABNORMAL HIGH (ref 8–23)
CO2: 29 mmol/L (ref 22–32)
Calcium: 9.3 mg/dL (ref 8.9–10.3)
Chloride: 97 mmol/L — ABNORMAL LOW (ref 98–111)
Creatinine, Ser: 1.3 mg/dL — ABNORMAL HIGH (ref 0.44–1.00)
GFR calc Af Amer: 48 mL/min — ABNORMAL LOW (ref >60)
GFR calc non Af Amer: 41 mL/min — ABNORMAL LOW (ref >60)
Glucose, Bld: 122 mg/dL — ABNORMAL HIGH (ref 70–99)
Potassium: 3.6 mmol/L (ref 3.5–5.1)
Sodium: 139 mmol/L (ref 135–145)
Total Bilirubin: 0.4 mg/dL (ref 0.3–1.2)
Total Protein: 7.8 g/dL (ref 6.5–8.1)

## 2019-11-25 LAB — FERRITIN: Ferritin: 423 ng/mL — ABNORMAL HIGH (ref 11–307)

## 2019-11-25 LAB — VITAMIN B12: Vitamin B-12: 859 pg/mL (ref 180–914)

## 2019-11-25 LAB — CBC WITH DIFFERENTIAL/PLATELET
Abs Immature Granulocytes: 0.02 10*3/uL (ref 0.00–0.07)
Basophils Absolute: 0 10*3/uL (ref 0.0–0.1)
Basophils Relative: 0 %
Eosinophils Absolute: 0 10*3/uL (ref 0.0–0.5)
Eosinophils Relative: 0 %
HCT: 33.9 % — ABNORMAL LOW (ref 36.0–46.0)
Hemoglobin: 10.3 g/dL — ABNORMAL LOW (ref 12.0–15.0)
Immature Granulocytes: 0 %
Lymphocytes Relative: 21 %
Lymphs Abs: 1.6 10*3/uL (ref 0.7–4.0)
MCH: 30.3 pg (ref 26.0–34.0)
MCHC: 30.4 g/dL (ref 30.0–36.0)
MCV: 99.7 fL (ref 80.0–100.0)
Monocytes Absolute: 0.7 10*3/uL (ref 0.1–1.0)
Monocytes Relative: 9 %
Neutro Abs: 5.2 10*3/uL (ref 1.7–7.7)
Neutrophils Relative %: 70 %
Platelets: 240 10*3/uL (ref 150–400)
RBC: 3.4 MIL/uL — ABNORMAL LOW (ref 3.87–5.11)
RDW: 12.7 % (ref 11.5–15.5)
WBC: 7.5 10*3/uL (ref 4.0–10.5)
nRBC: 0 % (ref 0.0–0.2)

## 2019-11-25 LAB — FOLATE: Folate: 8.9 ng/mL (ref >5.9)

## 2019-11-25 LAB — IRON AND TIBC
Iron: 72 ug/dL (ref 28–170)
Saturation Ratios: 25 % (ref 10.4–31.8)
TIBC: 285 ug/dL (ref 250–450)
UIBC: 213 ug/dL

## 2019-12-01 ENCOUNTER — Ambulatory Visit (HOSPITAL_COMMUNITY): Payer: Medicare Other | Admitting: Hematology

## 2019-12-02 ENCOUNTER — Ambulatory Visit (HOSPITAL_COMMUNITY): Payer: Medicare Other | Admitting: Nurse Practitioner

## 2019-12-04 ENCOUNTER — Other Ambulatory Visit: Payer: Self-pay

## 2019-12-04 ENCOUNTER — Inpatient Hospital Stay (HOSPITAL_COMMUNITY): Payer: Medicare Other | Admitting: Nurse Practitioner

## 2019-12-04 DIAGNOSIS — D649 Anemia, unspecified: Secondary | ICD-10-CM | POA: Diagnosis not present

## 2019-12-04 NOTE — Assessment & Plan Note (Signed)
1.  Normocytic anemia: -Etiology CKD and relative iron deficiency state. -Colonoscopy on 06/01/2019 showed diverticulosis in the sigmoid and descending colon.  Four 4-5 mm polyps at the hepatic flexure and in the cecum, removed with a cold snare. -EGD on 06/01/2019 shows normal esophagus, normal duodenal bulb and second part of duodenum. -Last Feraheme infusion was on 03/30/2019 and 04/06/2019.  Patient did not experience any improvement in energy. -She denies any bleeding per rectum or melena. -It was discussed in her last visit the possible role of erythropoiesis stimulating agents if her hemoglobin drops below 10. -Labs done on 11/25/2019 showed her hemoglobin 10.3, ferritin 423, percent saturation 25, platelets 240, WBC 7.5 -We will follow-up with her in 3 months and repeat labs  2.  B12 deficiency: -She apparently stopped taking B12 few months ago.  She started it back on her last visit. -Labs on 11/25/2019 showed her B12 level has improved to 859.  3.  History of left breast cancer: -Treated with lumpectomy and lymph node dissection in 1990.  She also received chemotherapy followed by radiation followed by 5 years of tamoxifen. -Recurrent left breast cancer 1995, underwent mastectomy, breast implant followed by chemotherapy.

## 2019-12-04 NOTE — Progress Notes (Signed)
[place], [place] [address]   CLINIC:  Medical Oncology/Hematology  PCP:  Asencion Noble, MD [address] [place] [address] [phone]   REASON FOR VISIT: Follow-up for normocytic anemia  CURRENT THERAPY: Observation   INTERVAL HISTORY:  Ann Fuller 73 y.o. female returns for routine follow-up for normocytic anemia.  Patient reports she has been doing well since her last visit.  She reports she still has fatigue during the day.  She denies any bright red bleeding per rectum or melena.  Denies any easy bruising or bleeding. Denies any nausea, vomiting, or diarrhea. Denies any new pains. Had not noticed any recent bleeding such as epistaxis, hematuria or hematochezia. Denies recent chest pain on exertion, shortness of breath on minimal exertion, pre-syncopal episodes, or palpitations. Denies any numbness or tingling in hands or feet. Denies any recent fevers, infections, or recent hospitalizations. Patient reports appetite at 75% and energy level at 0%.  She is eating well maintain her weight this time.     REVIEW OF SYSTEMS:  Review of Systems  Constitutional: Positive for fatigue.  Respiratory: Positive for shortness of breath.   Cardiovascular: Positive for leg swelling.  Neurological: Positive for dizziness.  Psychiatric/Behavioral: Positive for sleep disturbance.  All other systems reviewed and are negative.    PAST MEDICAL/SURGICAL HISTORY:  Past Medical History:  Diagnosis Date  . Adenomatous colon polyp   . Arthritis   . Asthma   . Breast cancer ([facility]) [id number]  . Bulging lumbar disc   . CHF (congestive heart failure) ([facility])   . Depression   . Diverticulitis   . Dyspepsia   . Essential hypertension, benign   . GERD (gastroesophageal reflux disease)   . Heart murmur   . Hyperlipidemia   . IBS (irritable bowel syndrome)   . Internal hemorrhoid   . Right shoulder pain   . Tubular adenoma 11/2010  . Type 2  diabetes mellitus ([facility])    Past Surgical History:  Procedure Laterality Date  . ABDOMINAL HYSTERECTOMY    . APPENDECTOMY    . BACK SURGERY  2007   Lumbar fusion  . BIOPSY  06/01/2019   Procedure: BIOPSY;  Surgeon: Daneil Dolin, MD;  [facility];  Service: Endoscopy;;  colon  . CATARACT EXTRACTION W/PHACO Left 08/27/2013   Procedure: CATARACT EXTRACTION PHACO AND INTRAOCULAR LENS PLACEMENT (IOC);  Surgeon: Tonny Branch, MD;  Location: [facility];  Service: Ophthalmology;  Laterality: Left;  CDE 4.25  . CATARACT EXTRACTION W/PHACO Right 09/24/2013   Procedure: CATARACT EXTRACTION PHACO AND INTRAOCULAR LENS PLACEMENT (IOC);  Surgeon: Tonny Branch, MD;  Location: [facility];  Service: Ophthalmology;  Laterality: Right;  CDE:  8.16  . CHOLECYSTECTOMY    . COLONOSCOPY  12/13/2010   Dr. Margarito Courser pancolonic diverticular.tubular adenoma  . COLONOSCOPY  07/13/2003  . COLONOSCOPY N/A 10/28/2015   [id number] diverticulosis, multiple tubular adenomas removed. next tcs 10/2020.   Marland Kitchen COLONOSCOPY WITH PROPOFOL N/A 06/01/2019   Procedure: COLONOSCOPY WITH PROPOFOL;  Surgeon: Daneil Dolin, MD;  [facility];  Service: Endoscopy;  Laterality: N/A;  12:30PM  . ESOPHAGOGASTRODUODENOSCOPY  12/13/2010   Dr. Jennet Maduro hernia, fundal gland type polyps  . ESOPHAGOGASTRODUODENOSCOPY (EGD) WITH PROPOFOL N/A 06/01/2019   Procedure: ESOPHAGOGASTRODUODENOSCOPY (EGD) WITH PROPOFOL;  Surgeon: Daneil Dolin, MD;  [facility];  Service: Endoscopy;  Laterality: N/A;  . EYE SURGERY Left    Left KPE 08/27/13  . MALONEY DILATION N/A  06/01/2019   Procedure: Venia Minks DILATION;  Surgeon: Daneil Dolin, MD;  [facility];  Service: Endoscopy;  Laterality: N/A;  . MASTECTOMY Left 1995  . NASAL ENDOSCOPY WITH EPISTAXIS CONTROL Left 07/16/2016   Procedure: ENDOSCOPIC LEFT NASAL CAUTERY;  Surgeon: Leta Baptist, MD;  Location: [facility];  Service: ENT;  Laterality: Left;  .  POLYPECTOMY  06/01/2019   Procedure: POLYPECTOMY;  Surgeon: Daneil Dolin, MD;  [facility];  Service: Endoscopy;;  colon  . TONSILLECTOMY    . TUBAL LIGATION       SOCIAL HISTORY:  Social History   Socioeconomic History  . Marital status: Married    Spouse name: Not on file  . Number of children: Not on file  . Years of education: Not on file  . Highest education level: Not on file  Occupational History  . Not on file  Tobacco Use  . Smoking status: Never Smoker  . Smokeless tobacco: Never Used  Substance and Sexual Activity  . Alcohol use: No  . Drug use: No  . Sexual activity: Yes    Birth control/protection: Surgical  Other Topics Concern  . Not on file  Social History Narrative  . Not on file   Social Determinants of Health   Financial Resource Strain:   . Difficulty of Paying Living Expenses: Not on file  Food Insecurity:   . Worried About Charity fundraiser in the Last Year: Not on file  . Ran Out of Food in the Last Year: Not on file  Transportation Needs:   . Lack of Transportation (Medical): Not on file  . Lack of Transportation (Non-Medical): Not on file  Physical Activity:   . Days of Exercise per Week: Not on file  . Minutes of Exercise per Session: Not on file  Stress:   . Feeling of Stress : Not on file  Social Connections:   . Frequency of Communication with Friends and Family: Not on file  . Frequency of Social Gatherings with Friends and Family: Not on file  . Attends Religious Services: Not on file  . Active Member of Clubs or Organizations: Not on file  . Attends Archivist Meetings: Not on file  . Marital Status: Not on file  Intimate Partner Violence:   . Fear of Current or Ex-Partner: Not on file  . Emotionally Abused: Not on file  . Physically Abused: Not on file  . Sexually Abused: Not on file    FAMILY HISTORY:  Family History  Problem Relation Age of Onset  . Colon cancer Mother   . Heart attack Father     . Heart attack Sister   . Stroke Sister   . Cancer - Lung Sister     CURRENT MEDICATIONS:  Outpatient Encounter Medications as of 12/04/2019  Medication Sig  . amLODipine (NORVASC) 5 MG tablet Take 5 mg by mouth daily.  Marland Kitchen atorvastatin (LIPITOR) 40 MG tablet Take 40 mg by mouth at bedtime.   . Benralizumab (FASENRA) 30 MG/ML SOSY Inject 30 mg into the skin every 8 (eight) weeks.   . budesonide-formoterol (SYMBICORT) 80-4.5 MCG/ACT inhaler Inhale 2 puffs into the lungs 2 (two) times daily.  Marland Kitchen BYDUREON 2 MG PEN Inject 2 mg into the skin once a week. Tuesday  . carvedilol (COREG) 3.125 MG tablet Take 1 tablet (3.125 mg total) by mouth 2 (two) times daily with a meal.  . doxazosin (CARDURA) 4 MG tablet Take 4 mg  by mouth daily.  . furosemide (LASIX) 40 MG tablet Take 40 mg by mouth 2 (two) times daily.   . Insulin Glargine (LANTUS) 100 UNIT/ML Solostar Pen Inject 26 Units into the skin every morning.   . Insulin Lispro (HUMALOG KWIKPEN [place]) Inject 6 Units into the skin 2 (two) times daily before a meal.   . ipratropium-albuterol (DUONEB) 0.5-2.5 (3) MG/3ML SOLN Take 3 mLs by nebulization every 4 (four) hours.  Marland Kitchen loperamide (IMODIUM A-D) 2 MG tablet Take 1 mg by mouth every other day. Takes whole tablet if diarrhea is severe  . magnesium oxide (MAG-OX) 400 MG tablet Take 400 mg by mouth daily.  . mometasone-formoterol (DULERA) 100-5 MCG/ACT AERO Inhale 2 puffs into the lungs 2 (two) times daily.  . montelukast (SINGULAIR) 10 MG tablet TAKE ONE TABLET BY MOUTH EVERY NIGHT AT BEDTIME (Patient taking differently: Take 10 mg by mouth at bedtime. )  . olmesartan (BENICAR) 40 MG tablet Take 40 mg by mouth daily.  . pantoprazole (PROTONIX) 40 MG tablet TAKE ONE TABLET BY MOUTH TWICE A DAY BEFORE A MEAL  . sertraline (ZOLOFT) 50 MG tablet Take 50 mg by mouth daily.   Marland Kitchen Spacer/Aero-Holding Chambers (AEROCHAMBER MV) inhaler Use as instructed  . SPIRIVA HANDIHALER 18 MCG inhalation capsule Place 18 mcg  into inhaler and inhale daily.   . temazepam (RESTORIL) 15 MG capsule Take 15 mg by mouth at bedtime as needed for sleep.  . vitamin C (ASCORBIC ACID) 500 MG tablet Take 500 mg by mouth daily.   Marland Kitchen acetaminophen (TYLENOL) 650 MG CR tablet Take 650 mg by mouth every 8 (eight) hours as needed for pain.  Marland Kitchen albuterol (PROVENTIL HFA;VENTOLIN HFA) 108 (90 Base) MCG/ACT inhaler Inhale 2 puffs into the lungs every 4 (four) hours as needed for wheezing or shortness of breath.  . dicyclomine (BENTYL) 10 MG capsule TAKE ONE CAPSULE BY MOUTH PRIOR TO MEALSAS NEEDED. NO MORE THAN FOUR TIMES DAILY. MAY CAUSE DROWINESS (Patient not taking: Reported on 12/04/2019)  . EPINEPHrine (EPIPEN 2-PAK) 0.3 mg/0.3 mL IJ SOAJ injection Inject 0.3 mg into the muscle daily as needed (allergic reaction).  . fluticasone (FLONASE) 50 MCG/ACT nasal spray Place 1 spray into both nostrils daily as needed for allergies or rhinitis.  Marland Kitchen LORazepam (ATIVAN) 1 MG tablet Take 1 mg by mouth 2 (two) times daily as needed for anxiety or sleep.    No facility-administered encounter medications on file as of 12/04/2019.    ALLERGIES:  Allergies  Allergen Reactions  . Codeine Swelling    Tongue swells  . Ceftin [Cefuroxime Axetil] Swelling  . Metformin And Related Diarrhea  . Nexium [Esomeprazole Magnesium] Diarrhea  . Omnicef [Cefdinir] Diarrhea  . Sulfa Antibiotics Rash     PHYSICAL EXAM:  ECOG Performance status: 1  Vitals:   12/04/19 1035  BP: (!) 126/48  Pulse: 84  Resp: 18  Temp: 97.7 F (36.5 C)  SpO2: 95%   Filed Weights   12/04/19 1035  Weight: 236 lb (107 kg)    Physical Exam Constitutional:      Appearance: She is obese.  Cardiovascular:     Rate and Rhythm: Normal rate and regular rhythm.     Heart sounds: Normal heart sounds.  Pulmonary:     Effort: Pulmonary effort is normal.     Breath sounds: Normal breath sounds.  Abdominal:     General: Bowel sounds are normal.     Palpations: Abdomen is soft.   Musculoskeletal:  General: Normal range of motion.  Skin:    General: Skin is warm.  Neurological:     Mental Status: She is alert and oriented to person, place, and time. Mental status is at baseline.  Psychiatric:        Mood and Affect: Mood normal.        Behavior: Behavior normal.        Thought Content: Thought content normal.        Judgment: Judgment normal.      LABORATORY DATA:  I have reviewed the labs as listed.  CBC    Component Value Date/Time   WBC 7.5 11/25/2019 1023   RBC 3.40 (L) 11/25/2019 1023   HGB 10.3 (L) 11/25/2019 1023   HGB 10.7 (L) 01/22/2017 1008   HCT 33.9 (L) 11/25/2019 1023   HCT 33.0 (L) 01/22/2017 1008   PLT 240 11/25/2019 1023   PLT 280 01/22/2017 1008   MCV 99.7 11/25/2019 1023   MCV 88 01/22/2017 1008   MCH 30.3 11/25/2019 1023   MCHC 30.4 11/25/2019 1023   RDW 12.7 11/25/2019 1023   RDW 14.4 01/22/2017 1008   LYMPHSABS 1.6 11/25/2019 1023   LYMPHSABS 2.7 01/22/2017 1008   MONOABS 0.7 11/25/2019 1023   EOSABS 0.0 11/25/2019 1023   EOSABS 0.3 01/22/2017 1008   BASOSABS 0.0 11/25/2019 1023   BASOSABS 0.0 01/22/2017 1008   CMP Latest Ref Rng & Units 11/25/2019 08/13/2019 05/13/2019  Glucose 70 - 99 mg/dL 122(H) 131(H) 123(H)  BUN 8 - 23 mg/dL 27(H) 27(H) 24(H)  Creatinine 0.44 - 1.00 mg/dL 1.30(H) 1.12(H) 1.16(H)  Sodium 135 - 145 mmol/L 139 137 141  Potassium 3.5 - 5.1 mmol/L 3.6 3.9 3.5  Chloride 98 - 111 mmol/L 97(L) 99 100  CO2 22 - 32 mmol/L 29 30 31   Calcium 8.9 - 10.3 mg/dL 9.3 9.0 9.2  Total Protein 6.5 - 8.1 g/dL 7.8 7.7 7.4  Total Bilirubin 0.3 - 1.2 mg/dL 0.4 0.5 0.5  Alkaline Phos 38 - 126 U/L 63 65 64  AST 15 - 41 U/L 21 17 19   ALT 0 - 44 U/L 19 15 19      I personally performed a face-to-face visit.  All questions were answered to patient's stated satisfaction. Encouraged patient to call with any new concerns or questions before his next visit to the cancer center and we can certain see him sooner, if  needed.     ASSESSMENT & PLAN:   Normocytic anemia 1.  Normocytic anemia: -Etiology CKD and relative iron deficiency state. -Colonoscopy on 06/01/2019 showed diverticulosis in the sigmoid and descending colon.  Four 4-5 mm polyps at the hepatic flexure and in the cecum, removed with a cold snare. -EGD on 06/01/2019 shows normal esophagus, normal duodenal bulb and second part of duodenum. -Last Feraheme infusion was on 03/30/2019 and 04/06/2019.  Patient did not experience any improvement in energy. -She denies any bleeding per rectum or melena. -It was discussed in her last visit the possible role of erythropoiesis stimulating agents if her hemoglobin drops below 10. -Labs done on 11/25/2019 showed her hemoglobin 10.3, ferritin 423, percent saturation 25, platelets 240, WBC 7.5 -We will follow-up with her in 3 months and repeat labs  2.  B12 deficiency: -She apparently stopped taking B12 few months ago.  She started it back on her last visit. -Labs on 11/25/2019 showed her B12 level has improved to 859.  3.  History of left breast cancer: -Treated with lumpectomy and lymph node  dissection in 1990.  She also received chemotherapy followed by radiation followed by 5 years of tamoxifen. -Recurrent left breast cancer 1995, underwent mastectomy, breast implant followed by chemotherapy.      Orders placed this encounter:  Orders Placed This Encounter  Procedures  . Lactate dehydrogenase  . CBC with Differential/Platelet  . Comprehensive metabolic panel  . Ferritin  . Iron and TIBC  . Vitamin B12  . Vitamin D 25 hydroxy  . Folate      Francene Finders, FNP-C [place] [phone]

## 2019-12-27 DIAGNOSIS — R05 Cough: Secondary | ICD-10-CM | POA: Diagnosis not present

## 2019-12-27 DIAGNOSIS — J4550 Severe persistent asthma, uncomplicated: Secondary | ICD-10-CM | POA: Diagnosis not present

## 2019-12-27 DIAGNOSIS — R062 Wheezing: Secondary | ICD-10-CM | POA: Diagnosis not present

## 2019-12-27 DIAGNOSIS — I509 Heart failure, unspecified: Secondary | ICD-10-CM | POA: Diagnosis not present

## 2020-01-04 ENCOUNTER — Ambulatory Visit (INDEPENDENT_AMBULATORY_CARE_PROVIDER_SITE_OTHER): Payer: Self-pay | Admitting: Gastroenterology

## 2020-01-04 DIAGNOSIS — R195 Other fecal abnormalities: Secondary | ICD-10-CM

## 2020-01-04 LAB — IFOBT (OCCULT BLOOD): IFOBT: NEGATIVE

## 2020-01-05 NOTE — Progress Notes (Signed)
IFOBT Negative.

## 2020-01-26 DIAGNOSIS — M10072 Idiopathic gout, left ankle and foot: Secondary | ICD-10-CM | POA: Diagnosis not present

## 2020-01-27 DIAGNOSIS — R05 Cough: Secondary | ICD-10-CM | POA: Diagnosis not present

## 2020-01-27 DIAGNOSIS — I509 Heart failure, unspecified: Secondary | ICD-10-CM | POA: Diagnosis not present

## 2020-01-27 DIAGNOSIS — J4550 Severe persistent asthma, uncomplicated: Secondary | ICD-10-CM | POA: Diagnosis not present

## 2020-01-27 DIAGNOSIS — R062 Wheezing: Secondary | ICD-10-CM | POA: Diagnosis not present

## 2020-01-28 ENCOUNTER — Ambulatory Visit: Payer: Medicare Other | Admitting: Nurse Practitioner

## 2020-01-31 ENCOUNTER — Other Ambulatory Visit: Payer: Self-pay

## 2020-01-31 ENCOUNTER — Ambulatory Visit: Payer: Medicare PPO | Attending: Internal Medicine

## 2020-01-31 DIAGNOSIS — Z23 Encounter for immunization: Secondary | ICD-10-CM

## 2020-01-31 NOTE — Progress Notes (Signed)
   Covid-19 Vaccination Clinic  Name:  MATHEL BRAINARD    MRN: [id number] DOB: 1946-09-11  01/31/2020  Ms. Hilke was observed post Covid-19 immunization for 15 minutes without incidence. She was provided with Vaccine Information Sheet and instruction to access the V-Safe system.   Ms. Fasching was instructed to call 911 with any severe reactions post vaccine: Marland Kitchen Difficulty breathing  . Swelling of your face and throat  . A fast heartbeat  . A bad rash all over your body  . Dizziness and weakness    Immunizations Administered    Name Date Dose VIS Date Route   Moderna COVID-19 Vaccine 01/31/2020 12:45 PM 0.5 mL 11/24/2019 Intramuscular   Manufacturer: Moderna   Lot: [id number]   [id number]

## 2020-02-02 ENCOUNTER — Other Ambulatory Visit: Payer: Self-pay

## 2020-02-02 ENCOUNTER — Ambulatory Visit: Payer: Medicare Other | Admitting: Nurse Practitioner

## 2020-02-02 ENCOUNTER — Encounter: Payer: Self-pay | Admitting: Nurse Practitioner

## 2020-02-02 VITALS — BP 156/88 | HR 93 | Temp 97.1°F | Ht 59.0 in | Wt 238.2 lb

## 2020-02-02 DIAGNOSIS — K580 Irritable bowel syndrome with diarrhea: Secondary | ICD-10-CM

## 2020-02-02 DIAGNOSIS — K219 Gastro-esophageal reflux disease without esophagitis: Secondary | ICD-10-CM | POA: Diagnosis not present

## 2020-02-02 NOTE — Assessment & Plan Note (Signed)
GERD is generally doing well on her current medications.  She does have a rescue medication (she cannot remember the name) that she takes if she has a flare.  She has only had 1 flare since we last saw her.  She feels this is due to eating too much just prior to bed.  Recommend she continue her current medications and call us for any worsening problems.  Follow-up in 6 months otherwise.

## 2020-02-02 NOTE — Assessment & Plan Note (Signed)
The patient has irritable bowel syndrome diarrhea type.  She will sometimes have constipation if she takes too much antidiarrheals.  Currently she is on a regimen of a half dose of Imodium daily and after several days will at times need to skip a day or 2 if her stools become constipated.  This seems to be working well for her.  No ongoing abdominal pain.  She is on antianxiety medication which is likely also contributing to her success in this arena.  Recommend she continue her current medications, call us for any worsening or severe symptoms.  Follow-up in 6 months otherwise.

## 2020-02-02 NOTE — Patient Instructions (Signed)
Your health issues we discussed today were:   Irritable bowel syndrome, diarrhea type (IBS-D): 1. I am glad the half dose of Imodium daily is working for you 2. You can continue to take this based on how you respond 3. You can skip a day or 2 if your stools become a bit constipated 4. Call us if you have any worsening or severe symptoms  GERD (reflux/heartburn): 1. Your symptoms seem to be doing well on her current medications 2. Continue your current medications. 3. As you have been, you can take a "rescue medicine" as needed for a flare of GERD 4. Call us for any worsening or severe symptoms  Overall I recommend:  1. Continue your other current medications 2. Return for follow-up in 6 months 3. Call us if you have any questions or concerns   ---------------------------------------------------------------  I am glad you are able to get your first Covid vaccine!  ---------------------------------------------------------------   At [facility] we value your feedback. You may receive a survey about your visit today. Please share your experience as we strive to create trusting relationships with our patients to provide genuine, compassionate, quality care.  We appreciate your understanding and patience as we review any laboratory studies, imaging, and other diagnostic tests that are ordered as we care for you. Our office policy is 5 business days for review of these results, and any emergent or urgent results are addressed in a timely manner for your best interest. If you do not hear from our office in 1 week, please contact us.   We also encourage the use of MyChart, which contains your medical information for your review as well. If you are not enrolled in this feature, an access code is on this after visit summary for your convenience. Thank you for allowing [place] to be involved in your care.  It was great to see you today!  I hope you have a great day!!

## 2020-02-02 NOTE — Progress Notes (Signed)
Referring Provider: Asencion Noble, MD Primary Care Physician:  Asencion Noble, MD Primary GI:  Dr. Gala Romney  Chief Complaint  Patient presents with  . Constipation    HPI:   Ann Fuller is a 74 y.o. female who presents for follow-up on IBS-D, GERD, dark stools.  Patient was last seen in our office 11/11/2019 for the same.  History of self-limiting but persistent diarrhea.  Normocytic anemia diagnosed in 2019 in setting of CKD and relative iron deficiency state status post Feraheme in April 2020.  History of dysphagia and notes she does not have teeth likely contributing.  Colonoscopy up-to-date 2020 with diverticulosis and 4 polyps found to be tubular adenoma, benign random colon biopsies.  Recommended repeat in 2023.  EGD also recently on file 06/01/2019 status post dilation with minimal polypoid mucosa and normal duodenum.  Previous stool studies normal.  At her last visit she noted GERD gotten worse and EMS recommended Prilosec.  Query panic attack.  Flare of symptoms only occurred twice and not daily.  Noted to be spitting up phlegm with a history of COPD and asthma.  Has dark stools, although still brown and not black/tarry.  Intermittent diarrhea at least once a week for which Imodium is effective.  Some constipation mixed in as well.  Out of 10 stools noted she would expect for to be normal, 5 to be diarrhea, 1 to be constipated.  Has been trying half-dose Imodium.  Overall felt her upper GI symptoms were likely related to anxiety and recommended she continue her current PPI dose with Tums as needed for flares.  IBS worsen by anxiety and being upset, but a "nerve pill" helps her symptoms.  Imodium seems to be effective.  Recommended half dose Imodium every morning and before a "large meal" which typically results in diarrhea.  Follow-up in 3 months.  Also recommended iFOBT test due to dark stools, which resulted negative.  Today she states she's doing ok overall. She was able to get her first  COVID-19 vaccine at a [facility] site. GERD is doing pretty good on PPI. Had one episode of flare when she ate too much, she took a "rescue medication" which helped. Stools have been "pretty good." Has been a little constipated this week from taking Imodium half-dose for several days; this keeps her diarrhea well-managed but can at times cause mild constipation. Energy is better on prednisone due to gout flare. Denies abdominal pain, N/V, hematochezia, melena, fever, chills, unintentional weight loss. Denies URI or flu-like symptoms. Denies loss of sense of taste or smell. Has been tested for COVID-19 pre-procedure (sleep study x 2 and colonoscopy) which were all negative. Denies chest pain, dyspnea, dizziness, lightheadedness, syncope, near syncope. Denies any other upper or lower GI symptoms.  Past Medical History:  Diagnosis Date  . Adenomatous colon polyp   . Arthritis   . Asthma   . Breast cancer ([place]) [id number]  . Bulging lumbar disc   . CHF (congestive heart failure) ([place])   . Depression   . Diverticulitis   . Dyspepsia   . Essential hypertension, benign   . GERD (gastroesophageal reflux disease)   . Heart murmur   . Hyperlipidemia   . IBS (irritable bowel syndrome)   . Internal hemorrhoid   . Right shoulder pain   . Tubular adenoma 11/2010  . Type 2 diabetes mellitus ([place])     Past Surgical History:  Procedure Laterality Date  . ABDOMINAL HYSTERECTOMY    . APPENDECTOMY    .  BACK SURGERY  2007   Lumbar fusion  . BIOPSY  06/01/2019   Procedure: BIOPSY;  Surgeon: Daneil Dolin, MD;  [facility];  Service: Endoscopy;;  colon  . CATARACT EXTRACTION W/PHACO Left 08/27/2013   Procedure: CATARACT EXTRACTION PHACO AND INTRAOCULAR LENS PLACEMENT (IOC);  Surgeon: Tonny Branch, MD;  Location: [facility];  Service: Ophthalmology;  Laterality: Left;  CDE 4.25  . CATARACT EXTRACTION W/PHACO Right 09/24/2013   Procedure: CATARACT EXTRACTION PHACO AND INTRAOCULAR LENS PLACEMENT (IOC);   Surgeon: Tonny Branch, MD;  Location: [facility];  Service: Ophthalmology;  Laterality: Right;  CDE:  8.16  . CHOLECYSTECTOMY    . COLONOSCOPY  12/13/2010   Dr. Margarito Courser pancolonic diverticular.tubular adenoma  . COLONOSCOPY  07/13/2003  . COLONOSCOPY N/A 10/28/2015   [id number] diverticulosis, multiple tubular adenomas removed. next tcs 10/2020.   Marland Kitchen COLONOSCOPY WITH PROPOFOL N/A 06/01/2019   Procedure: COLONOSCOPY WITH PROPOFOL;  Surgeon: Daneil Dolin, MD;  [facility];  Service: Endoscopy;  Laterality: N/A;  12:30PM  . ESOPHAGOGASTRODUODENOSCOPY  12/13/2010   Dr. Jennet Maduro hernia, fundal gland type polyps  . ESOPHAGOGASTRODUODENOSCOPY (EGD) WITH PROPOFOL N/A 06/01/2019   Procedure: ESOPHAGOGASTRODUODENOSCOPY (EGD) WITH PROPOFOL;  Surgeon: Daneil Dolin, MD;  [facility];  Service: Endoscopy;  Laterality: N/A;  . EYE SURGERY Left    Left KPE 08/27/13  . MALONEY DILATION N/A 06/01/2019   Procedure: Venia Minks DILATION;  Surgeon: Daneil Dolin, MD;  [facility];  Service: Endoscopy;  Laterality: N/A;  . MASTECTOMY Left 1995  . NASAL ENDOSCOPY WITH EPISTAXIS CONTROL Left 07/16/2016   Procedure: ENDOSCOPIC LEFT NASAL CAUTERY;  Surgeon: Leta Baptist, MD;  Location: [place];  Service: ENT;  Laterality: Left;  . POLYPECTOMY  06/01/2019   Procedure: POLYPECTOMY;  Surgeon: Daneil Dolin, MD;  [facility];  Service: Endoscopy;;  colon  . TONSILLECTOMY    . TUBAL LIGATION      Current Outpatient Medications  Medication Sig Dispense Refill  . acetaminophen (TYLENOL) 650 MG CR tablet Take 650 mg by mouth every 8 (eight) hours as needed for pain.    Marland Kitchen albuterol (PROVENTIL HFA;VENTOLIN HFA) 108 (90 Base) MCG/ACT inhaler Inhale 2 puffs into the lungs every 4 (four) hours as needed for wheezing or shortness of breath.    Marland Kitchen amLODipine (NORVASC) 5 MG tablet Take 5 mg by mouth daily.    Marland Kitchen atorvastatin (LIPITOR) 40 MG tablet Take 40 mg by mouth at  bedtime.     . Benralizumab (FASENRA) 30 MG/ML SOSY Inject 30 mg into the skin every 8 (eight) weeks.     . budesonide-formoterol (SYMBICORT) 80-4.5 MCG/ACT inhaler Inhale 2 puffs into the lungs 2 (two) times daily. 1 Inhaler 0  . BYDUREON 2 MG PEN Inject 2 mg into the skin once a week. Tuesday    . carvedilol (COREG) 3.125 MG tablet Take 1 tablet (3.125 mg total) by mouth 2 (two) times daily with a meal. 60 tablet 1  . dicyclomine (BENTYL) 10 MG capsule TAKE ONE CAPSULE BY MOUTH PRIOR TO MEALSAS NEEDED. NO MORE THAN FOUR TIMES DAILY. MAY CAUSE DROWINESS 120 capsule 3  . doxazosin (CARDURA) 4 MG tablet Take 4 mg by mouth daily.    Marland Kitchen EPINEPHrine (EPIPEN 2-PAK) 0.3 mg/0.3 mL IJ SOAJ injection Inject 0.3 mg into the muscle daily as needed (allergic reaction).    . fluticasone (FLONASE) 50 MCG/ACT nasal spray Place 1 spray into both nostrils daily  as needed for allergies or rhinitis.    . furosemide (LASIX) 40 MG tablet Take 40 mg by mouth 2 (two) times daily.     . Insulin Glargine (LANTUS) 100 UNIT/ML Solostar Pen Inject 26 Units into the skin every morning.     . Insulin Lispro (HUMALOG KWIKPEN [place]) Inject 6 Units into the skin 2 (two) times daily before a meal.     . ipratropium-albuterol (DUONEB) 0.5-2.5 (3) MG/3ML SOLN Take 3 mLs by nebulization every 4 (four) hours. 360 mL 6  . loperamide (IMODIUM A-D) 2 MG tablet Take 1 mg by mouth every other day. Takes whole tablet if diarrhea is severe    . LORazepam (ATIVAN) 1 MG tablet Take 1 mg by mouth 2 (two) times daily as needed for anxiety or sleep.     . magnesium oxide (MAG-OX) 400 MG tablet Take 400 mg by mouth daily.    . mometasone-formoterol (DULERA) 100-5 MCG/ACT AERO Inhale 2 puffs into the lungs 2 (two) times daily.    . montelukast (SINGULAIR) 10 MG tablet TAKE ONE TABLET BY MOUTH EVERY NIGHT AT BEDTIME (Patient taking differently: Take 10 mg by mouth at bedtime. ) 30 tablet 5  . olmesartan (BENICAR) 40 MG tablet Take 40 mg by mouth daily.     . pantoprazole (PROTONIX) 40 MG tablet TAKE ONE TABLET BY MOUTH TWICE A DAY BEFORE A MEAL 180 tablet 1  . sertraline (ZOLOFT) 50 MG tablet Take 50 mg by mouth daily.     Marland Kitchen Spacer/Aero-Holding Chambers (AEROCHAMBER MV) inhaler Use as instructed 1 each 0  . SPIRIVA HANDIHALER 18 MCG inhalation capsule Place 18 mcg into inhaler and inhale daily.     . temazepam (RESTORIL) 15 MG capsule Take 15 mg by mouth at bedtime as needed for sleep.    . vitamin C (ASCORBIC ACID) 500 MG tablet Take 500 mg by mouth daily.      No current facility-administered medications for this visit.    Allergies as of 02/02/2020 - Review Complete 02/02/2020  Allergen Reaction Noted  . Codeine Swelling   . Ceftin [cefuroxime axetil] Swelling 05/02/2012  . Metformin and related Diarrhea 11/26/2012  . Nexium [esomeprazole magnesium] Diarrhea 05/02/2012  . Omnicef [cefdinir] Diarrhea 05/02/2012  . Sulfa antibiotics Rash 05/02/2012    Family History  Problem Relation Age of Onset  . Colon cancer Mother   . Heart attack Father   . Heart attack Sister   . Stroke Sister   . Cancer - Lung Sister     Social History   Socioeconomic History  . Marital status: Married    Spouse name: Not on file  . Number of children: Not on file  . Years of education: Not on file  . Highest education level: Not on file  Occupational History  . Not on file  Tobacco Use  . Smoking status: Never Smoker  . Smokeless tobacco: Never Used  Substance and Sexual Activity  . Alcohol use: No  . Drug use: No  . Sexual activity: Yes    Birth control/protection: Surgical  Other Topics Concern  . Not on file  Social History Narrative  . Not on file   Social Determinants of Health   Financial Resource Strain:   . Difficulty of Paying Living Expenses: Not on file  Food Insecurity:   . Worried About Charity fundraiser in the Last Year: Not on file  . Ran Out of Food in the Last Year: Not on file  Transportation  Needs:   . Lack of  Transportation (Medical): Not on file  . Lack of Transportation (Non-Medical): Not on file  Physical Activity:   . Days of Exercise per Week: Not on file  . Minutes of Exercise per Session: Not on file  Stress:   . Feeling of Stress : Not on file  Social Connections:   . Frequency of Communication with Friends and Family: Not on file  . Frequency of Social Gatherings with Friends and Family: Not on file  . Attends Religious Services: Not on file  . Active Member of Clubs or Organizations: Not on file  . Attends Archivist Meetings: Not on file  . Marital Status: Not on file    Review of Systems: General: Negative for anorexia, weight loss, fever, chills, fatigue, weakness. ENT: Negative for hoarseness, difficulty swallowing. CV: Negative for chest pain, angina, palpitations, peripheral edema.  Respiratory: Negative for dyspnea at rest, cough, sputum, wheezing.  GI: See history of present illness. MS: Having a gout flare, on prednisone.  Derm: Negative for rash or itching.  Endo: Negative for unusual weight change.  Heme: Negative for bruising or bleeding. Allergy: Negative for rash or hives.   Physical Exam: BP (!) 156/88   Pulse 93   Temp (!) 97.1 F (36.2 C) (Temporal)   Ht 4\' 11"  (1.499 m)   Wt 238 lb 3.2 oz (108 kg)   BMI 48.11 kg/m  General:   Alert and oriented. Pleasant and cooperative. Well-nourished and well-developed.  Eyes:  Without icterus, sclera clear and conjunctiva pink.  Ears:  Normal auditory acuity. Cardiovascular:  S1, S2 present without murmurs appreciated. Extremities without clubbing or edema. Respiratory:  Clear to auscultation bilaterally. No wheezes, rales, or rhonchi. No distress.  Gastrointestinal:  +BS, soft, non-tender and non-distended. No HSM noted. No guarding or rebound. No masses appreciated.  Rectal:  Deferred  Musculoskalatal:  Symmetrical without gross deformities. Neurologic:  Alert and oriented x4;  grossly normal  neurologically. Psych:  Alert and cooperative. Normal mood and affect. Heme/Lymph/Immune: No excessive bruising noted.    02/02/2020 3:59 PM   Disclaimer: This note was dictated with voice recognition software. Similar sounding words can inadvertently be transcribed and may not be corrected upon review.

## 2020-02-03 ENCOUNTER — Encounter: Payer: Self-pay | Admitting: Internal Medicine

## 2020-02-03 NOTE — Progress Notes (Signed)
CC'ED TO PCP 

## 2020-02-04 DIAGNOSIS — Z79899 Other long term (current) drug therapy: Secondary | ICD-10-CM | POA: Diagnosis not present

## 2020-02-04 DIAGNOSIS — R0600 Dyspnea, unspecified: Secondary | ICD-10-CM | POA: Diagnosis not present

## 2020-02-04 DIAGNOSIS — R0609 Other forms of dyspnea: Secondary | ICD-10-CM | POA: Diagnosis not present

## 2020-02-04 DIAGNOSIS — J4550 Severe persistent asthma, uncomplicated: Secondary | ICD-10-CM | POA: Diagnosis not present

## 2020-02-24 DIAGNOSIS — R05 Cough: Secondary | ICD-10-CM | POA: Diagnosis not present

## 2020-02-24 DIAGNOSIS — J4550 Severe persistent asthma, uncomplicated: Secondary | ICD-10-CM | POA: Diagnosis not present

## 2020-02-24 DIAGNOSIS — R062 Wheezing: Secondary | ICD-10-CM | POA: Diagnosis not present

## 2020-02-24 DIAGNOSIS — I509 Heart failure, unspecified: Secondary | ICD-10-CM | POA: Diagnosis not present

## 2020-02-29 ENCOUNTER — Inpatient Hospital Stay (HOSPITAL_COMMUNITY): Payer: Medicare PPO

## 2020-02-29 DIAGNOSIS — J441 Chronic obstructive pulmonary disease with (acute) exacerbation: Secondary | ICD-10-CM | POA: Diagnosis not present

## 2020-03-01 ENCOUNTER — Ambulatory Visit: Payer: Medicare PPO

## 2020-03-02 ENCOUNTER — Ambulatory Visit (HOSPITAL_COMMUNITY): Payer: Medicare PPO | Admitting: Nurse Practitioner

## 2020-03-02 ENCOUNTER — Ambulatory Visit: Payer: Medicare PPO | Attending: Internal Medicine

## 2020-03-02 DIAGNOSIS — E785 Hyperlipidemia, unspecified: Secondary | ICD-10-CM | POA: Diagnosis not present

## 2020-03-02 DIAGNOSIS — I5032 Chronic diastolic (congestive) heart failure: Secondary | ICD-10-CM | POA: Diagnosis not present

## 2020-03-02 DIAGNOSIS — M1000 Idiopathic gout, unspecified site: Secondary | ICD-10-CM | POA: Diagnosis not present

## 2020-03-02 DIAGNOSIS — Z79899 Other long term (current) drug therapy: Secondary | ICD-10-CM | POA: Diagnosis not present

## 2020-03-02 DIAGNOSIS — Z23 Encounter for immunization: Secondary | ICD-10-CM

## 2020-03-02 DIAGNOSIS — E1129 Type 2 diabetes mellitus with other diabetic kidney complication: Secondary | ICD-10-CM | POA: Diagnosis not present

## 2020-03-02 NOTE — Progress Notes (Signed)
   Covid-19 Vaccination Clinic  Name:  Ann Fuller    MRN: [id number] DOB: [birth year]  03/02/2020  [name] was observed post Covid-19 immunization for 15 minutes without incident. She was provided with Vaccine Information Sheet and instruction to access the V-Safe system.   [name] was instructed to call 911 with any severe reactions post vaccine: Marland Kitchen Difficulty breathing  . Swelling of face and throat  . A fast heartbeat  . A bad rash all over body  . Dizziness and weakness   Immunizations Administered    Name Date Dose VIS Date Route   Moderna COVID-19 Vaccine 03/02/2020  2:13 PM 0.5 mL 11/24/2019 Intramuscular   Manufacturer: Moderna   Lot: [id number]   [id number]

## 2020-03-03 ENCOUNTER — Ambulatory Visit (HOSPITAL_COMMUNITY): Payer: Medicare Other | Admitting: Nurse Practitioner

## 2020-03-03 ENCOUNTER — Other Ambulatory Visit: Payer: Self-pay | Admitting: Nurse Practitioner

## 2020-03-08 DIAGNOSIS — Z6841 Body Mass Index (BMI) 40.0 and over, adult: Secondary | ICD-10-CM | POA: Diagnosis not present

## 2020-03-08 DIAGNOSIS — Z9989 Dependence on other enabling machines and devices: Secondary | ICD-10-CM | POA: Diagnosis not present

## 2020-03-08 DIAGNOSIS — G4733 Obstructive sleep apnea (adult) (pediatric): Secondary | ICD-10-CM | POA: Diagnosis not present

## 2020-03-09 DIAGNOSIS — M1000 Idiopathic gout, unspecified site: Secondary | ICD-10-CM | POA: Diagnosis not present

## 2020-03-09 DIAGNOSIS — F410 Panic disorder [episodic paroxysmal anxiety] without agoraphobia: Secondary | ICD-10-CM | POA: Diagnosis not present

## 2020-03-09 DIAGNOSIS — E1122 Type 2 diabetes mellitus with diabetic chronic kidney disease: Secondary | ICD-10-CM | POA: Diagnosis not present

## 2020-03-09 DIAGNOSIS — I5032 Chronic diastolic (congestive) heart failure: Secondary | ICD-10-CM | POA: Diagnosis not present

## 2020-03-09 DIAGNOSIS — N183 Chronic kidney disease, stage 3 unspecified: Secondary | ICD-10-CM | POA: Diagnosis not present

## 2020-03-11 DIAGNOSIS — M109 Gout, unspecified: Secondary | ICD-10-CM | POA: Diagnosis not present

## 2020-03-16 ENCOUNTER — Other Ambulatory Visit: Payer: Self-pay

## 2020-03-16 ENCOUNTER — Encounter (HOSPITAL_COMMUNITY)
Admission: RE | Admit: 2020-03-16 | Discharge: 2020-03-16 | Disposition: A | Discharge status: Home / Self Care | Payer: Medicare PPO | Source: Ambulatory Visit | Attending: Pulmonary Disease | Admitting: Pulmonary Disease

## 2020-03-21 DIAGNOSIS — E119 Type 2 diabetes mellitus without complications: Secondary | ICD-10-CM | POA: Diagnosis not present

## 2020-03-21 DIAGNOSIS — Z8674 Personal history of sudden cardiac arrest: Secondary | ICD-10-CM | POA: Diagnosis not present

## 2020-03-21 DIAGNOSIS — Z743 Need for continuous supervision: Secondary | ICD-10-CM | POA: Diagnosis not present

## 2020-03-21 DIAGNOSIS — G936 Cerebral edema: Secondary | ICD-10-CM | POA: Diagnosis not present

## 2020-03-21 DIAGNOSIS — J189 Pneumonia, unspecified organism: Secondary | ICD-10-CM | POA: Diagnosis not present

## 2020-03-21 DIAGNOSIS — G931 Anoxic brain damage, not elsewhere classified: Secondary | ICD-10-CM | POA: Diagnosis not present

## 2020-03-21 DIAGNOSIS — G9382 Brain death: Secondary | ICD-10-CM | POA: Diagnosis not present

## 2020-03-21 DIAGNOSIS — N179 Acute kidney failure, unspecified: Secondary | ICD-10-CM | POA: Diagnosis not present

## 2020-03-21 DIAGNOSIS — Z6841 Body Mass Index (BMI) 40.0 and over, adult: Secondary | ICD-10-CM | POA: Diagnosis not present

## 2020-03-21 DIAGNOSIS — J440 Chronic obstructive pulmonary disease with acute lower respiratory infection: Secondary | ICD-10-CM | POA: Diagnosis not present

## 2020-03-21 DIAGNOSIS — N390 Urinary tract infection, site not specified: Secondary | ICD-10-CM | POA: Diagnosis not present

## 2020-03-21 DIAGNOSIS — J9601 Acute respiratory failure with hypoxia: Secondary | ICD-10-CM | POA: Diagnosis not present

## 2020-03-21 DIAGNOSIS — J449 Chronic obstructive pulmonary disease, unspecified: Secondary | ICD-10-CM | POA: Diagnosis not present

## 2020-03-21 DIAGNOSIS — R4182 Altered mental status, unspecified: Secondary | ICD-10-CM | POA: Diagnosis not present

## 2020-03-21 DIAGNOSIS — I469 Cardiac arrest, cause unspecified: Secondary | ICD-10-CM | POA: Diagnosis not present

## 2020-03-21 DIAGNOSIS — D539 Nutritional anemia, unspecified: Secondary | ICD-10-CM | POA: Diagnosis not present

## 2020-03-22 ENCOUNTER — Inpatient Hospital Stay (HOSPITAL_COMMUNITY): Payer: Medicare PPO | Attending: Hematology

## 2020-03-23 ENCOUNTER — Telehealth (HOSPITAL_COMMUNITY): Payer: Self-pay | Admitting: Surgery

## 2020-03-23 ENCOUNTER — Ambulatory Visit (HOSPITAL_COMMUNITY): Payer: Medicare PPO | Admitting: Nurse Practitioner

## 2020-03-23 NOTE — Telephone Encounter (Signed)
Pt's husband left a voicemail stating that the pt was in a coma in [facility].  Francene Finders made aware of the situation.

## 2020-03-24 DEATH — deceased

## 2020-08-02 ENCOUNTER — Ambulatory Visit: Payer: Medicare PPO | Admitting: Nurse Practitioner

## 2020-08-21 IMAGING — US VENOUS DOPPLER ULTRASOUND OF LEFT LOWER EXTREMITY
1 series · 13 of 24 positions shown · non-contrast
Comparison: None.

CLINICAL DATA: Left calf pain for the past 3 days. Evaluate for
DVT.



[Series 1: venous doppler ultrasound of left lower extremity · 0.08mm/px · 13 of 52 slices shown]
[im 1/52]
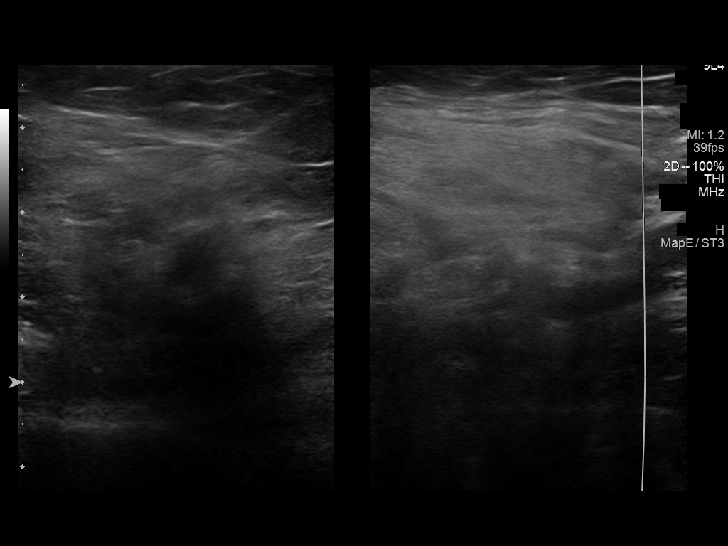
[im 5/52]
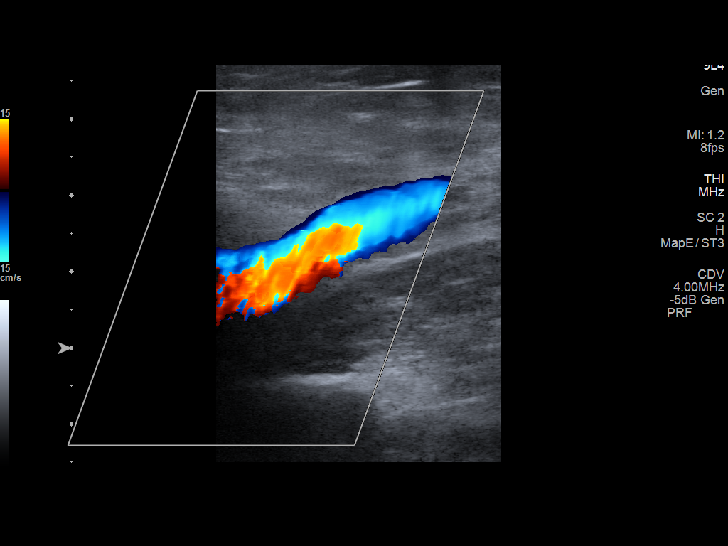
[im 9/52]
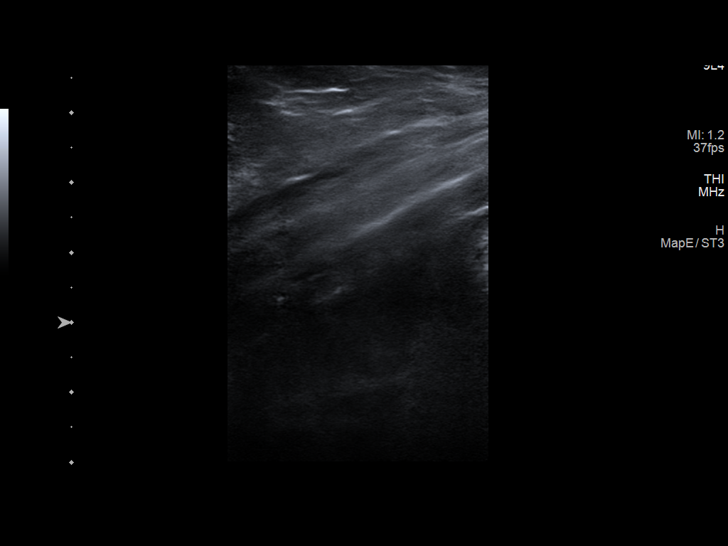
[im 14/52]
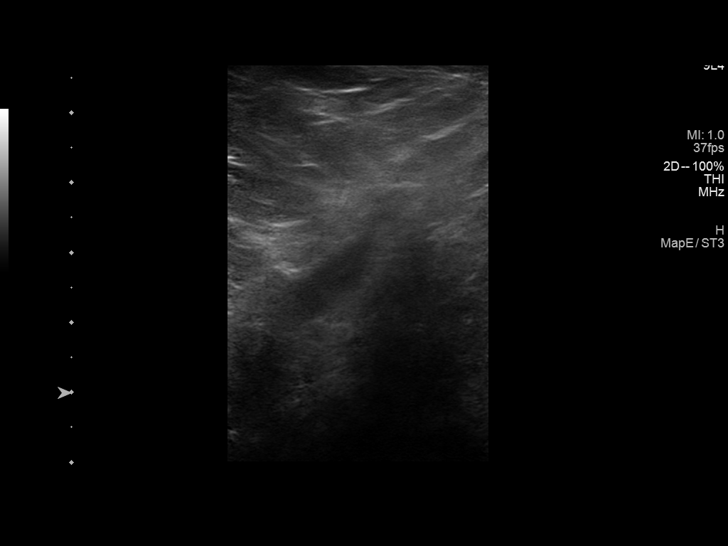
[im 18/52]
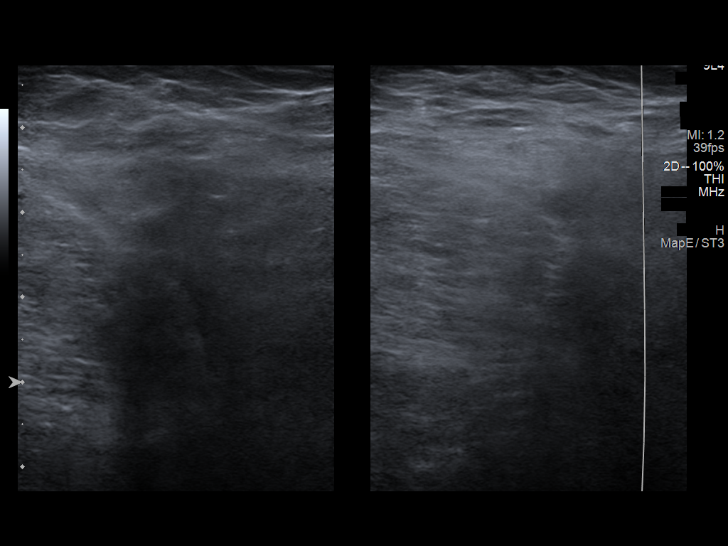
[im 23/52]
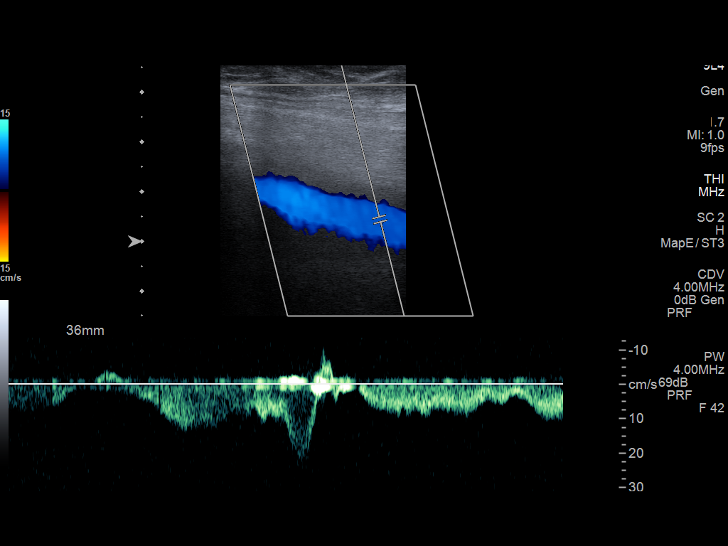
[im 27/52]
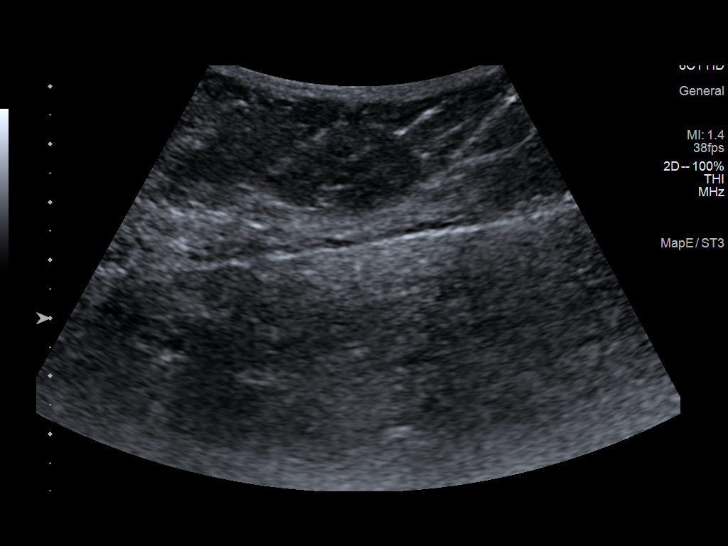
[im 29/52]
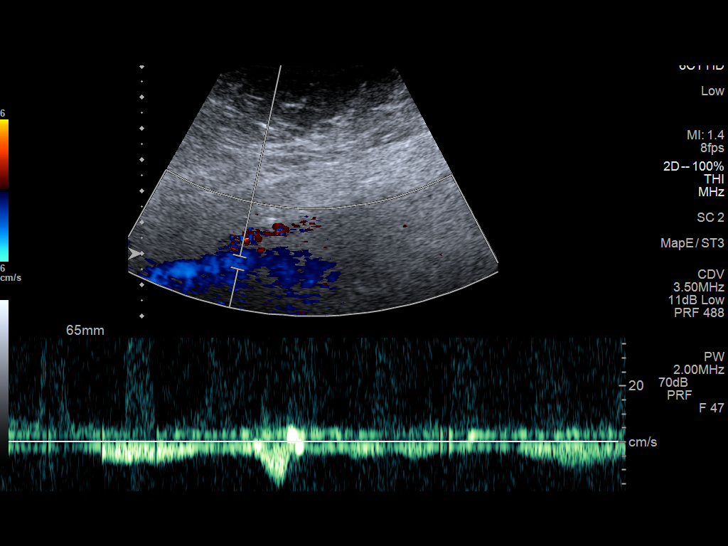
[im 34/52]
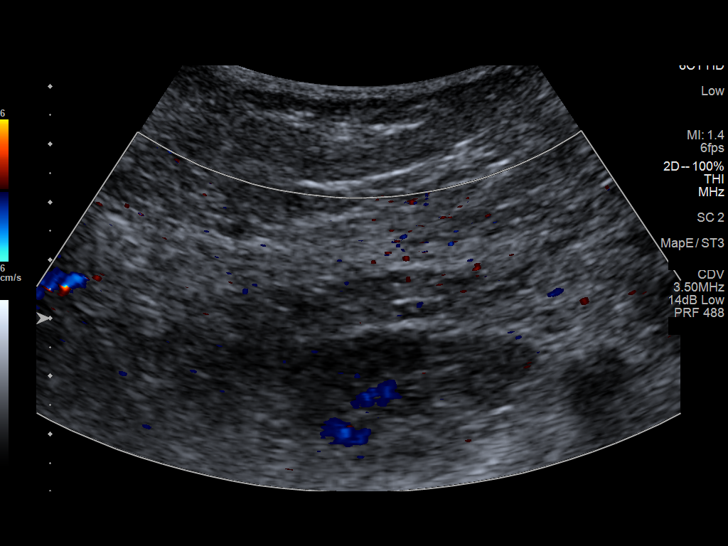
[im 38/52]
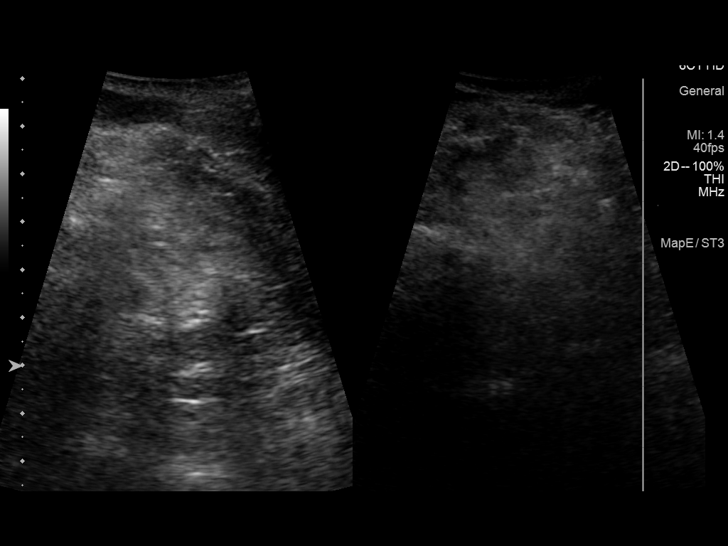
[im 43/52]
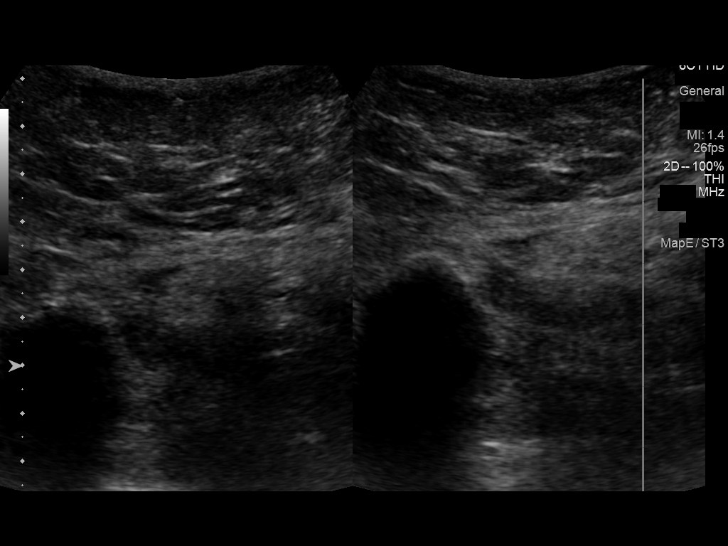
[im 47/52]
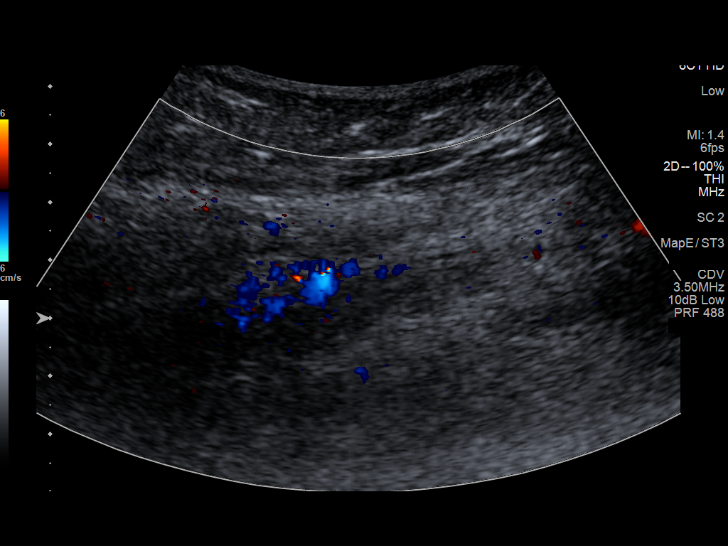
[im 52/52]
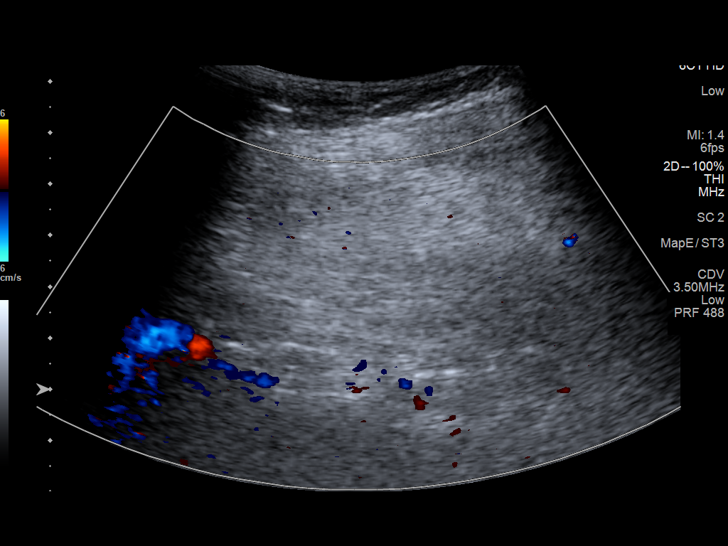

[13 of 24 positions shown; findings below may reference images not displayed]

FINDINGS: Examination is degraded due to patient body habitus and poor
sonographic window.

Contralateral Common Femoral Vein: Respiratory phasicity is normal
and symmetric with the symptomatic side. No evidence of thrombus.
Normal compressibility.

Common Femoral Vein: No evidence of thrombus. Normal
compressibility, respiratory phasicity and response to augmentation.

Saphenofemoral Junction: No evidence of thrombus. Normal
compressibility and flow on color Doppler imaging.

Profunda Femoral Vein: No evidence of thrombus. Normal
compressibility and flow on color Doppler imaging.

Femoral Vein: No evidence of thrombus. Normal compressibility,
respiratory phasicity and response to augmentation.

Popliteal Vein: No evidence of thrombus. Normal compressibility,
respiratory phasicity and response to augmentation.

Calf Veins: Appear patent where imaged.

Superficial Great Saphenous Vein: No evidence of thrombus. Normal
compressibility.

Venous Reflux:  None.

Other Findings:  None.
IMPRESSION: No evidence of DVT within the left lower extremity.
